# Patient Record
Sex: Male | Born: 1945 | Race: White | Hispanic: No | Marital: Married | Smoking: Former smoker
Health system: Southern US, Community
[De-identification: ages and names within clinical notes are randomized; demographics above are authoritative.]

## PROBLEM LIST (undated history)

## (undated) DIAGNOSIS — C439 Malignant melanoma of skin, unspecified: Secondary | ICD-10-CM

## (undated) DIAGNOSIS — I10 Essential (primary) hypertension: Secondary | ICD-10-CM

## (undated) DIAGNOSIS — M48061 Spinal stenosis, lumbar region without neurogenic claudication: Secondary | ICD-10-CM

## (undated) DIAGNOSIS — E7800 Pure hypercholesterolemia, unspecified: Secondary | ICD-10-CM

## (undated) DIAGNOSIS — M1990 Unspecified osteoarthritis, unspecified site: Secondary | ICD-10-CM

## (undated) DIAGNOSIS — I639 Cerebral infarction, unspecified: Secondary | ICD-10-CM

## (undated) DIAGNOSIS — K279 Peptic ulcer, site unspecified, unspecified as acute or chronic, without hemorrhage or perforation: Secondary | ICD-10-CM

## (undated) DIAGNOSIS — J189 Pneumonia, unspecified organism: Secondary | ICD-10-CM

## (undated) DIAGNOSIS — Z860100 Personal history of colon polyps, unspecified: Secondary | ICD-10-CM

## (undated) DIAGNOSIS — C44529 Squamous cell carcinoma of skin of other part of trunk: Secondary | ICD-10-CM

## (undated) DIAGNOSIS — Z87442 Personal history of urinary calculi: Secondary | ICD-10-CM

## (undated) DIAGNOSIS — K219 Gastro-esophageal reflux disease without esophagitis: Secondary | ICD-10-CM

## (undated) HISTORY — DX: Malignant melanoma of skin, unspecified: C43.9

## (undated) HISTORY — DX: Squamous cell carcinoma of skin of other part of trunk: C44.529

## (undated) HISTORY — DX: Spinal stenosis, lumbar region without neurogenic claudication: M48.061

## (undated) HISTORY — PX: PARTIAL GASTRECTOMY: SHX2172

---

## 2004-10-28 ENCOUNTER — Ambulatory Visit: Payer: Self-pay | Admitting: Internal Medicine

## 2004-11-11 ENCOUNTER — Ambulatory Visit: Payer: Self-pay | Admitting: Internal Medicine

## 2004-11-23 ENCOUNTER — Ambulatory Visit: Payer: Self-pay | Admitting: *Deleted

## 2004-12-14 ENCOUNTER — Encounter: Admission: RE | Admit: 2004-12-14 | Discharge: 2004-12-14 | Payer: Self-pay | Admitting: Thoracic Surgery

## 2010-09-11 ENCOUNTER — Encounter: Payer: Self-pay | Admitting: Thoracic Surgery

## 2014-11-11 DIAGNOSIS — I10 Essential (primary) hypertension: Secondary | ICD-10-CM | POA: Diagnosis not present

## 2014-11-11 DIAGNOSIS — E780 Pure hypercholesterolemia: Secondary | ICD-10-CM | POA: Diagnosis not present

## 2014-11-11 DIAGNOSIS — Z79899 Other long term (current) drug therapy: Secondary | ICD-10-CM | POA: Diagnosis not present

## 2014-11-11 DIAGNOSIS — Z1389 Encounter for screening for other disorder: Secondary | ICD-10-CM | POA: Diagnosis not present

## 2014-11-11 DIAGNOSIS — I679 Cerebrovascular disease, unspecified: Secondary | ICD-10-CM | POA: Diagnosis not present

## 2014-11-11 DIAGNOSIS — Z0000 Encounter for general adult medical examination without abnormal findings: Secondary | ICD-10-CM | POA: Diagnosis not present

## 2014-11-11 DIAGNOSIS — Z23 Encounter for immunization: Secondary | ICD-10-CM | POA: Diagnosis not present

## 2014-11-11 DIAGNOSIS — Z125 Encounter for screening for malignant neoplasm of prostate: Secondary | ICD-10-CM | POA: Diagnosis not present

## 2015-03-25 DIAGNOSIS — J209 Acute bronchitis, unspecified: Secondary | ICD-10-CM | POA: Diagnosis not present

## 2015-03-29 DIAGNOSIS — D128 Benign neoplasm of rectum: Secondary | ICD-10-CM | POA: Diagnosis not present

## 2015-03-29 DIAGNOSIS — D122 Benign neoplasm of ascending colon: Secondary | ICD-10-CM | POA: Diagnosis not present

## 2015-03-29 DIAGNOSIS — K621 Rectal polyp: Secondary | ICD-10-CM | POA: Diagnosis not present

## 2015-03-29 DIAGNOSIS — D126 Benign neoplasm of colon, unspecified: Secondary | ICD-10-CM | POA: Diagnosis not present

## 2015-03-29 DIAGNOSIS — Z86010 Personal history of colonic polyps: Secondary | ICD-10-CM | POA: Diagnosis not present

## 2015-03-29 DIAGNOSIS — D123 Benign neoplasm of transverse colon: Secondary | ICD-10-CM | POA: Diagnosis not present

## 2015-03-29 DIAGNOSIS — D121 Benign neoplasm of appendix: Secondary | ICD-10-CM | POA: Diagnosis not present

## 2015-03-29 DIAGNOSIS — Z09 Encounter for follow-up examination after completed treatment for conditions other than malignant neoplasm: Secondary | ICD-10-CM | POA: Diagnosis not present

## 2015-06-08 DIAGNOSIS — Z23 Encounter for immunization: Secondary | ICD-10-CM | POA: Diagnosis not present

## 2015-09-03 DIAGNOSIS — S39012A Strain of muscle, fascia and tendon of lower back, initial encounter: Secondary | ICD-10-CM | POA: Diagnosis not present

## 2015-12-08 DIAGNOSIS — M25561 Pain in right knee: Secondary | ICD-10-CM | POA: Diagnosis not present

## 2016-04-26 DIAGNOSIS — Z79899 Other long term (current) drug therapy: Secondary | ICD-10-CM | POA: Diagnosis not present

## 2016-04-26 DIAGNOSIS — E785 Hyperlipidemia, unspecified: Secondary | ICD-10-CM | POA: Diagnosis not present

## 2016-04-26 DIAGNOSIS — Z9181 History of falling: Secondary | ICD-10-CM | POA: Diagnosis not present

## 2016-04-26 DIAGNOSIS — M25561 Pain in right knee: Secondary | ICD-10-CM | POA: Diagnosis not present

## 2016-04-26 DIAGNOSIS — I10 Essential (primary) hypertension: Secondary | ICD-10-CM | POA: Diagnosis not present

## 2016-04-26 DIAGNOSIS — Z1389 Encounter for screening for other disorder: Secondary | ICD-10-CM | POA: Diagnosis not present

## 2016-04-26 DIAGNOSIS — Z8673 Personal history of transient ischemic attack (TIA), and cerebral infarction without residual deficits: Secondary | ICD-10-CM | POA: Diagnosis not present

## 2016-04-26 DIAGNOSIS — Z0000 Encounter for general adult medical examination without abnormal findings: Secondary | ICD-10-CM | POA: Diagnosis not present

## 2016-06-05 DIAGNOSIS — Z23 Encounter for immunization: Secondary | ICD-10-CM | POA: Diagnosis not present

## 2016-11-01 DIAGNOSIS — H6091 Unspecified otitis externa, right ear: Secondary | ICD-10-CM | POA: Diagnosis not present

## 2017-04-12 DIAGNOSIS — M1711 Unilateral primary osteoarthritis, right knee: Secondary | ICD-10-CM | POA: Diagnosis not present

## 2017-04-12 DIAGNOSIS — M25561 Pain in right knee: Secondary | ICD-10-CM | POA: Diagnosis not present

## 2017-04-12 DIAGNOSIS — M898X8 Other specified disorders of bone, other site: Secondary | ICD-10-CM | POA: Diagnosis not present

## 2017-04-12 DIAGNOSIS — G8929 Other chronic pain: Secondary | ICD-10-CM | POA: Diagnosis not present

## 2017-05-02 DIAGNOSIS — Z79899 Other long term (current) drug therapy: Secondary | ICD-10-CM | POA: Diagnosis not present

## 2017-05-02 DIAGNOSIS — Z23 Encounter for immunization: Secondary | ICD-10-CM | POA: Diagnosis not present

## 2017-05-02 DIAGNOSIS — E785 Hyperlipidemia, unspecified: Secondary | ICD-10-CM | POA: Diagnosis not present

## 2017-05-02 DIAGNOSIS — Z0000 Encounter for general adult medical examination without abnormal findings: Secondary | ICD-10-CM | POA: Diagnosis not present

## 2017-05-02 DIAGNOSIS — I10 Essential (primary) hypertension: Secondary | ICD-10-CM | POA: Diagnosis not present

## 2017-05-02 DIAGNOSIS — Z6833 Body mass index (BMI) 33.0-33.9, adult: Secondary | ICD-10-CM | POA: Diagnosis not present

## 2017-05-02 DIAGNOSIS — Z8673 Personal history of transient ischemic attack (TIA), and cerebral infarction without residual deficits: Secondary | ICD-10-CM | POA: Diagnosis not present

## 2017-05-11 DIAGNOSIS — M23261 Derangement of other lateral meniscus due to old tear or injury, right knee: Secondary | ICD-10-CM | POA: Diagnosis not present

## 2017-05-11 DIAGNOSIS — M25561 Pain in right knee: Secondary | ICD-10-CM | POA: Diagnosis not present

## 2017-05-22 DIAGNOSIS — M23300 Other meniscus derangements, unspecified lateral meniscus, right knee: Secondary | ICD-10-CM | POA: Diagnosis not present

## 2017-05-22 DIAGNOSIS — M1711 Unilateral primary osteoarthritis, right knee: Secondary | ICD-10-CM | POA: Diagnosis not present

## 2017-05-22 DIAGNOSIS — G8929 Other chronic pain: Secondary | ICD-10-CM | POA: Diagnosis not present

## 2017-05-22 DIAGNOSIS — M23303 Other meniscus derangements, unspecified medial meniscus, right knee: Secondary | ICD-10-CM | POA: Diagnosis not present

## 2017-05-30 DIAGNOSIS — G8918 Other acute postprocedural pain: Secondary | ICD-10-CM | POA: Diagnosis not present

## 2017-05-30 DIAGNOSIS — S83271A Complex tear of lateral meniscus, current injury, right knee, initial encounter: Secondary | ICD-10-CM | POA: Diagnosis not present

## 2017-05-30 DIAGNOSIS — M659 Synovitis and tenosynovitis, unspecified: Secondary | ICD-10-CM | POA: Diagnosis not present

## 2017-05-30 DIAGNOSIS — M2241 Chondromalacia patellae, right knee: Secondary | ICD-10-CM | POA: Diagnosis not present

## 2017-05-30 DIAGNOSIS — M6751 Plica syndrome, right knee: Secondary | ICD-10-CM | POA: Diagnosis not present

## 2017-05-30 DIAGNOSIS — S83231A Complex tear of medial meniscus, current injury, right knee, initial encounter: Secondary | ICD-10-CM | POA: Diagnosis not present

## 2017-06-04 DIAGNOSIS — Z23 Encounter for immunization: Secondary | ICD-10-CM | POA: Diagnosis not present

## 2017-06-06 DIAGNOSIS — M25461 Effusion, right knee: Secondary | ICD-10-CM | POA: Diagnosis not present

## 2017-06-06 DIAGNOSIS — R2689 Other abnormalities of gait and mobility: Secondary | ICD-10-CM | POA: Diagnosis not present

## 2017-06-06 DIAGNOSIS — M62551 Muscle wasting and atrophy, not elsewhere classified, right thigh: Secondary | ICD-10-CM | POA: Diagnosis not present

## 2017-06-06 DIAGNOSIS — M25561 Pain in right knee: Secondary | ICD-10-CM | POA: Diagnosis not present

## 2017-06-08 DIAGNOSIS — M62551 Muscle wasting and atrophy, not elsewhere classified, right thigh: Secondary | ICD-10-CM | POA: Diagnosis not present

## 2017-06-08 DIAGNOSIS — M25561 Pain in right knee: Secondary | ICD-10-CM | POA: Diagnosis not present

## 2017-06-08 DIAGNOSIS — R2689 Other abnormalities of gait and mobility: Secondary | ICD-10-CM | POA: Diagnosis not present

## 2017-06-08 DIAGNOSIS — M25461 Effusion, right knee: Secondary | ICD-10-CM | POA: Diagnosis not present

## 2017-06-12 DIAGNOSIS — M25461 Effusion, right knee: Secondary | ICD-10-CM | POA: Diagnosis not present

## 2017-06-12 DIAGNOSIS — M25561 Pain in right knee: Secondary | ICD-10-CM | POA: Diagnosis not present

## 2017-06-12 DIAGNOSIS — M62551 Muscle wasting and atrophy, not elsewhere classified, right thigh: Secondary | ICD-10-CM | POA: Diagnosis not present

## 2017-06-12 DIAGNOSIS — R2689 Other abnormalities of gait and mobility: Secondary | ICD-10-CM | POA: Diagnosis not present

## 2017-06-15 DIAGNOSIS — M25461 Effusion, right knee: Secondary | ICD-10-CM | POA: Diagnosis not present

## 2017-06-15 DIAGNOSIS — R2689 Other abnormalities of gait and mobility: Secondary | ICD-10-CM | POA: Diagnosis not present

## 2017-06-15 DIAGNOSIS — M62551 Muscle wasting and atrophy, not elsewhere classified, right thigh: Secondary | ICD-10-CM | POA: Diagnosis not present

## 2017-06-15 DIAGNOSIS — M25561 Pain in right knee: Secondary | ICD-10-CM | POA: Diagnosis not present

## 2017-06-20 DIAGNOSIS — M25461 Effusion, right knee: Secondary | ICD-10-CM | POA: Diagnosis not present

## 2017-06-20 DIAGNOSIS — M62551 Muscle wasting and atrophy, not elsewhere classified, right thigh: Secondary | ICD-10-CM | POA: Diagnosis not present

## 2017-06-20 DIAGNOSIS — R2689 Other abnormalities of gait and mobility: Secondary | ICD-10-CM | POA: Diagnosis not present

## 2017-06-20 DIAGNOSIS — M25561 Pain in right knee: Secondary | ICD-10-CM | POA: Diagnosis not present

## 2017-06-22 DIAGNOSIS — M25561 Pain in right knee: Secondary | ICD-10-CM | POA: Diagnosis not present

## 2017-06-22 DIAGNOSIS — R2689 Other abnormalities of gait and mobility: Secondary | ICD-10-CM | POA: Diagnosis not present

## 2017-06-22 DIAGNOSIS — M25461 Effusion, right knee: Secondary | ICD-10-CM | POA: Diagnosis not present

## 2017-06-22 DIAGNOSIS — M62551 Muscle wasting and atrophy, not elsewhere classified, right thigh: Secondary | ICD-10-CM | POA: Diagnosis not present

## 2017-07-03 DIAGNOSIS — J40 Bronchitis, not specified as acute or chronic: Secondary | ICD-10-CM | POA: Diagnosis not present

## 2017-07-03 DIAGNOSIS — H6092 Unspecified otitis externa, left ear: Secondary | ICD-10-CM | POA: Diagnosis not present

## 2017-07-03 DIAGNOSIS — Z6834 Body mass index (BMI) 34.0-34.9, adult: Secondary | ICD-10-CM | POA: Diagnosis not present

## 2017-07-03 DIAGNOSIS — J329 Chronic sinusitis, unspecified: Secondary | ICD-10-CM | POA: Diagnosis not present

## 2017-07-06 DIAGNOSIS — M25461 Effusion, right knee: Secondary | ICD-10-CM | POA: Diagnosis not present

## 2017-07-06 DIAGNOSIS — M62551 Muscle wasting and atrophy, not elsewhere classified, right thigh: Secondary | ICD-10-CM | POA: Diagnosis not present

## 2017-07-06 DIAGNOSIS — R2689 Other abnormalities of gait and mobility: Secondary | ICD-10-CM | POA: Diagnosis not present

## 2017-07-06 DIAGNOSIS — M25561 Pain in right knee: Secondary | ICD-10-CM | POA: Diagnosis not present

## 2017-09-13 DIAGNOSIS — Z9889 Other specified postprocedural states: Secondary | ICD-10-CM | POA: Diagnosis not present

## 2017-09-13 DIAGNOSIS — M7631 Iliotibial band syndrome, right leg: Secondary | ICD-10-CM | POA: Diagnosis not present

## 2017-09-13 DIAGNOSIS — M1711 Unilateral primary osteoarthritis, right knee: Secondary | ICD-10-CM | POA: Diagnosis not present

## 2017-09-13 DIAGNOSIS — G8929 Other chronic pain: Secondary | ICD-10-CM | POA: Diagnosis not present

## 2017-09-19 DIAGNOSIS — M25561 Pain in right knee: Secondary | ICD-10-CM | POA: Diagnosis not present

## 2017-09-19 DIAGNOSIS — M62551 Muscle wasting and atrophy, not elsewhere classified, right thigh: Secondary | ICD-10-CM | POA: Diagnosis not present

## 2017-09-19 DIAGNOSIS — R2689 Other abnormalities of gait and mobility: Secondary | ICD-10-CM | POA: Diagnosis not present

## 2017-09-21 DIAGNOSIS — M62551 Muscle wasting and atrophy, not elsewhere classified, right thigh: Secondary | ICD-10-CM | POA: Diagnosis not present

## 2017-09-21 DIAGNOSIS — R2689 Other abnormalities of gait and mobility: Secondary | ICD-10-CM | POA: Diagnosis not present

## 2017-09-21 DIAGNOSIS — M25561 Pain in right knee: Secondary | ICD-10-CM | POA: Diagnosis not present

## 2017-09-26 DIAGNOSIS — M62551 Muscle wasting and atrophy, not elsewhere classified, right thigh: Secondary | ICD-10-CM | POA: Diagnosis not present

## 2017-09-26 DIAGNOSIS — R2689 Other abnormalities of gait and mobility: Secondary | ICD-10-CM | POA: Diagnosis not present

## 2017-09-26 DIAGNOSIS — M25561 Pain in right knee: Secondary | ICD-10-CM | POA: Diagnosis not present

## 2017-09-28 DIAGNOSIS — M62551 Muscle wasting and atrophy, not elsewhere classified, right thigh: Secondary | ICD-10-CM | POA: Diagnosis not present

## 2017-09-28 DIAGNOSIS — M25561 Pain in right knee: Secondary | ICD-10-CM | POA: Diagnosis not present

## 2017-09-28 DIAGNOSIS — R2689 Other abnormalities of gait and mobility: Secondary | ICD-10-CM | POA: Diagnosis not present

## 2017-10-02 DIAGNOSIS — M62551 Muscle wasting and atrophy, not elsewhere classified, right thigh: Secondary | ICD-10-CM | POA: Diagnosis not present

## 2017-10-02 DIAGNOSIS — M25561 Pain in right knee: Secondary | ICD-10-CM | POA: Diagnosis not present

## 2017-10-02 DIAGNOSIS — R2689 Other abnormalities of gait and mobility: Secondary | ICD-10-CM | POA: Diagnosis not present

## 2017-10-05 DIAGNOSIS — M25561 Pain in right knee: Secondary | ICD-10-CM | POA: Diagnosis not present

## 2017-10-05 DIAGNOSIS — M62551 Muscle wasting and atrophy, not elsewhere classified, right thigh: Secondary | ICD-10-CM | POA: Diagnosis not present

## 2017-10-05 DIAGNOSIS — R2689 Other abnormalities of gait and mobility: Secondary | ICD-10-CM | POA: Diagnosis not present

## 2017-10-16 DIAGNOSIS — M62551 Muscle wasting and atrophy, not elsewhere classified, right thigh: Secondary | ICD-10-CM | POA: Diagnosis not present

## 2017-10-16 DIAGNOSIS — R2689 Other abnormalities of gait and mobility: Secondary | ICD-10-CM | POA: Diagnosis not present

## 2017-10-16 DIAGNOSIS — M25561 Pain in right knee: Secondary | ICD-10-CM | POA: Diagnosis not present

## 2017-10-23 DIAGNOSIS — R2689 Other abnormalities of gait and mobility: Secondary | ICD-10-CM | POA: Diagnosis not present

## 2017-10-23 DIAGNOSIS — M25561 Pain in right knee: Secondary | ICD-10-CM | POA: Diagnosis not present

## 2017-10-23 DIAGNOSIS — M62551 Muscle wasting and atrophy, not elsewhere classified, right thigh: Secondary | ICD-10-CM | POA: Diagnosis not present

## 2017-11-20 DIAGNOSIS — Z9889 Other specified postprocedural states: Secondary | ICD-10-CM | POA: Diagnosis not present

## 2017-11-20 DIAGNOSIS — M7631 Iliotibial band syndrome, right leg: Secondary | ICD-10-CM | POA: Diagnosis not present

## 2017-11-28 DIAGNOSIS — M5137 Other intervertebral disc degeneration, lumbosacral region: Secondary | ICD-10-CM | POA: Diagnosis not present

## 2017-11-28 DIAGNOSIS — M62830 Muscle spasm of back: Secondary | ICD-10-CM | POA: Diagnosis not present

## 2017-11-28 DIAGNOSIS — M9904 Segmental and somatic dysfunction of sacral region: Secondary | ICD-10-CM | POA: Diagnosis not present

## 2017-11-28 DIAGNOSIS — M9903 Segmental and somatic dysfunction of lumbar region: Secondary | ICD-10-CM | POA: Diagnosis not present

## 2017-11-28 DIAGNOSIS — M5441 Lumbago with sciatica, right side: Secondary | ICD-10-CM | POA: Diagnosis not present

## 2017-11-28 DIAGNOSIS — S8391XA Sprain of unspecified site of right knee, initial encounter: Secondary | ICD-10-CM | POA: Diagnosis not present

## 2017-11-28 DIAGNOSIS — M5136 Other intervertebral disc degeneration, lumbar region: Secondary | ICD-10-CM | POA: Diagnosis not present

## 2017-12-05 DIAGNOSIS — M5137 Other intervertebral disc degeneration, lumbosacral region: Secondary | ICD-10-CM | POA: Diagnosis not present

## 2017-12-05 DIAGNOSIS — M62830 Muscle spasm of back: Secondary | ICD-10-CM | POA: Diagnosis not present

## 2017-12-05 DIAGNOSIS — M9903 Segmental and somatic dysfunction of lumbar region: Secondary | ICD-10-CM | POA: Diagnosis not present

## 2017-12-05 DIAGNOSIS — M9904 Segmental and somatic dysfunction of sacral region: Secondary | ICD-10-CM | POA: Diagnosis not present

## 2017-12-05 DIAGNOSIS — M5441 Lumbago with sciatica, right side: Secondary | ICD-10-CM | POA: Diagnosis not present

## 2017-12-05 DIAGNOSIS — S8391XA Sprain of unspecified site of right knee, initial encounter: Secondary | ICD-10-CM | POA: Diagnosis not present

## 2017-12-05 DIAGNOSIS — M5136 Other intervertebral disc degeneration, lumbar region: Secondary | ICD-10-CM | POA: Diagnosis not present

## 2017-12-10 DIAGNOSIS — S8391XA Sprain of unspecified site of right knee, initial encounter: Secondary | ICD-10-CM | POA: Diagnosis not present

## 2017-12-10 DIAGNOSIS — M9903 Segmental and somatic dysfunction of lumbar region: Secondary | ICD-10-CM | POA: Diagnosis not present

## 2017-12-10 DIAGNOSIS — M9904 Segmental and somatic dysfunction of sacral region: Secondary | ICD-10-CM | POA: Diagnosis not present

## 2017-12-10 DIAGNOSIS — M5136 Other intervertebral disc degeneration, lumbar region: Secondary | ICD-10-CM | POA: Diagnosis not present

## 2017-12-10 DIAGNOSIS — M5441 Lumbago with sciatica, right side: Secondary | ICD-10-CM | POA: Diagnosis not present

## 2017-12-10 DIAGNOSIS — M62830 Muscle spasm of back: Secondary | ICD-10-CM | POA: Diagnosis not present

## 2017-12-10 DIAGNOSIS — M5137 Other intervertebral disc degeneration, lumbosacral region: Secondary | ICD-10-CM | POA: Diagnosis not present

## 2017-12-12 DIAGNOSIS — M5137 Other intervertebral disc degeneration, lumbosacral region: Secondary | ICD-10-CM | POA: Diagnosis not present

## 2017-12-12 DIAGNOSIS — M62830 Muscle spasm of back: Secondary | ICD-10-CM | POA: Diagnosis not present

## 2017-12-12 DIAGNOSIS — M9904 Segmental and somatic dysfunction of sacral region: Secondary | ICD-10-CM | POA: Diagnosis not present

## 2017-12-12 DIAGNOSIS — S8391XA Sprain of unspecified site of right knee, initial encounter: Secondary | ICD-10-CM | POA: Diagnosis not present

## 2017-12-12 DIAGNOSIS — M5441 Lumbago with sciatica, right side: Secondary | ICD-10-CM | POA: Diagnosis not present

## 2017-12-12 DIAGNOSIS — M5136 Other intervertebral disc degeneration, lumbar region: Secondary | ICD-10-CM | POA: Diagnosis not present

## 2017-12-12 DIAGNOSIS — M9903 Segmental and somatic dysfunction of lumbar region: Secondary | ICD-10-CM | POA: Diagnosis not present

## 2017-12-14 DIAGNOSIS — M5441 Lumbago with sciatica, right side: Secondary | ICD-10-CM | POA: Diagnosis not present

## 2017-12-14 DIAGNOSIS — M9903 Segmental and somatic dysfunction of lumbar region: Secondary | ICD-10-CM | POA: Diagnosis not present

## 2017-12-14 DIAGNOSIS — M5137 Other intervertebral disc degeneration, lumbosacral region: Secondary | ICD-10-CM | POA: Diagnosis not present

## 2017-12-14 DIAGNOSIS — M9904 Segmental and somatic dysfunction of sacral region: Secondary | ICD-10-CM | POA: Diagnosis not present

## 2017-12-14 DIAGNOSIS — M5136 Other intervertebral disc degeneration, lumbar region: Secondary | ICD-10-CM | POA: Diagnosis not present

## 2017-12-14 DIAGNOSIS — M62830 Muscle spasm of back: Secondary | ICD-10-CM | POA: Diagnosis not present

## 2017-12-14 DIAGNOSIS — S8391XA Sprain of unspecified site of right knee, initial encounter: Secondary | ICD-10-CM | POA: Diagnosis not present

## 2017-12-17 DIAGNOSIS — S8391XA Sprain of unspecified site of right knee, initial encounter: Secondary | ICD-10-CM | POA: Diagnosis not present

## 2017-12-17 DIAGNOSIS — M5136 Other intervertebral disc degeneration, lumbar region: Secondary | ICD-10-CM | POA: Diagnosis not present

## 2017-12-17 DIAGNOSIS — M5441 Lumbago with sciatica, right side: Secondary | ICD-10-CM | POA: Diagnosis not present

## 2017-12-17 DIAGNOSIS — M5137 Other intervertebral disc degeneration, lumbosacral region: Secondary | ICD-10-CM | POA: Diagnosis not present

## 2017-12-17 DIAGNOSIS — M9904 Segmental and somatic dysfunction of sacral region: Secondary | ICD-10-CM | POA: Diagnosis not present

## 2017-12-17 DIAGNOSIS — M9903 Segmental and somatic dysfunction of lumbar region: Secondary | ICD-10-CM | POA: Diagnosis not present

## 2017-12-17 DIAGNOSIS — M62830 Muscle spasm of back: Secondary | ICD-10-CM | POA: Diagnosis not present

## 2017-12-19 DIAGNOSIS — M9904 Segmental and somatic dysfunction of sacral region: Secondary | ICD-10-CM | POA: Diagnosis not present

## 2017-12-19 DIAGNOSIS — S8391XA Sprain of unspecified site of right knee, initial encounter: Secondary | ICD-10-CM | POA: Diagnosis not present

## 2017-12-19 DIAGNOSIS — M5136 Other intervertebral disc degeneration, lumbar region: Secondary | ICD-10-CM | POA: Diagnosis not present

## 2017-12-19 DIAGNOSIS — M62830 Muscle spasm of back: Secondary | ICD-10-CM | POA: Diagnosis not present

## 2017-12-19 DIAGNOSIS — M5137 Other intervertebral disc degeneration, lumbosacral region: Secondary | ICD-10-CM | POA: Diagnosis not present

## 2017-12-19 DIAGNOSIS — M9903 Segmental and somatic dysfunction of lumbar region: Secondary | ICD-10-CM | POA: Diagnosis not present

## 2017-12-19 DIAGNOSIS — M5441 Lumbago with sciatica, right side: Secondary | ICD-10-CM | POA: Diagnosis not present

## 2017-12-21 DIAGNOSIS — S8391XA Sprain of unspecified site of right knee, initial encounter: Secondary | ICD-10-CM | POA: Diagnosis not present

## 2017-12-21 DIAGNOSIS — M62830 Muscle spasm of back: Secondary | ICD-10-CM | POA: Diagnosis not present

## 2017-12-21 DIAGNOSIS — M5136 Other intervertebral disc degeneration, lumbar region: Secondary | ICD-10-CM | POA: Diagnosis not present

## 2017-12-21 DIAGNOSIS — M9903 Segmental and somatic dysfunction of lumbar region: Secondary | ICD-10-CM | POA: Diagnosis not present

## 2017-12-21 DIAGNOSIS — M9904 Segmental and somatic dysfunction of sacral region: Secondary | ICD-10-CM | POA: Diagnosis not present

## 2017-12-21 DIAGNOSIS — M5441 Lumbago with sciatica, right side: Secondary | ICD-10-CM | POA: Diagnosis not present

## 2017-12-21 DIAGNOSIS — M5137 Other intervertebral disc degeneration, lumbosacral region: Secondary | ICD-10-CM | POA: Diagnosis not present

## 2017-12-24 DIAGNOSIS — S8391XA Sprain of unspecified site of right knee, initial encounter: Secondary | ICD-10-CM | POA: Diagnosis not present

## 2017-12-24 DIAGNOSIS — M5137 Other intervertebral disc degeneration, lumbosacral region: Secondary | ICD-10-CM | POA: Diagnosis not present

## 2017-12-24 DIAGNOSIS — M9904 Segmental and somatic dysfunction of sacral region: Secondary | ICD-10-CM | POA: Diagnosis not present

## 2017-12-24 DIAGNOSIS — M5441 Lumbago with sciatica, right side: Secondary | ICD-10-CM | POA: Diagnosis not present

## 2017-12-24 DIAGNOSIS — M9903 Segmental and somatic dysfunction of lumbar region: Secondary | ICD-10-CM | POA: Diagnosis not present

## 2017-12-24 DIAGNOSIS — M62830 Muscle spasm of back: Secondary | ICD-10-CM | POA: Diagnosis not present

## 2017-12-24 DIAGNOSIS — M5136 Other intervertebral disc degeneration, lumbar region: Secondary | ICD-10-CM | POA: Diagnosis not present

## 2017-12-26 DIAGNOSIS — M5137 Other intervertebral disc degeneration, lumbosacral region: Secondary | ICD-10-CM | POA: Diagnosis not present

## 2017-12-26 DIAGNOSIS — S8391XA Sprain of unspecified site of right knee, initial encounter: Secondary | ICD-10-CM | POA: Diagnosis not present

## 2017-12-26 DIAGNOSIS — M9903 Segmental and somatic dysfunction of lumbar region: Secondary | ICD-10-CM | POA: Diagnosis not present

## 2017-12-26 DIAGNOSIS — M62830 Muscle spasm of back: Secondary | ICD-10-CM | POA: Diagnosis not present

## 2017-12-26 DIAGNOSIS — M9904 Segmental and somatic dysfunction of sacral region: Secondary | ICD-10-CM | POA: Diagnosis not present

## 2017-12-26 DIAGNOSIS — M5441 Lumbago with sciatica, right side: Secondary | ICD-10-CM | POA: Diagnosis not present

## 2017-12-26 DIAGNOSIS — M5136 Other intervertebral disc degeneration, lumbar region: Secondary | ICD-10-CM | POA: Diagnosis not present

## 2017-12-31 DIAGNOSIS — M9904 Segmental and somatic dysfunction of sacral region: Secondary | ICD-10-CM | POA: Diagnosis not present

## 2017-12-31 DIAGNOSIS — M5441 Lumbago with sciatica, right side: Secondary | ICD-10-CM | POA: Diagnosis not present

## 2017-12-31 DIAGNOSIS — M5136 Other intervertebral disc degeneration, lumbar region: Secondary | ICD-10-CM | POA: Diagnosis not present

## 2017-12-31 DIAGNOSIS — M62830 Muscle spasm of back: Secondary | ICD-10-CM | POA: Diagnosis not present

## 2017-12-31 DIAGNOSIS — M5137 Other intervertebral disc degeneration, lumbosacral region: Secondary | ICD-10-CM | POA: Diagnosis not present

## 2017-12-31 DIAGNOSIS — S8391XA Sprain of unspecified site of right knee, initial encounter: Secondary | ICD-10-CM | POA: Diagnosis not present

## 2017-12-31 DIAGNOSIS — M9903 Segmental and somatic dysfunction of lumbar region: Secondary | ICD-10-CM | POA: Diagnosis not present

## 2018-01-02 DIAGNOSIS — M9904 Segmental and somatic dysfunction of sacral region: Secondary | ICD-10-CM | POA: Diagnosis not present

## 2018-01-02 DIAGNOSIS — M5136 Other intervertebral disc degeneration, lumbar region: Secondary | ICD-10-CM | POA: Diagnosis not present

## 2018-01-02 DIAGNOSIS — M5441 Lumbago with sciatica, right side: Secondary | ICD-10-CM | POA: Diagnosis not present

## 2018-01-02 DIAGNOSIS — S8391XA Sprain of unspecified site of right knee, initial encounter: Secondary | ICD-10-CM | POA: Diagnosis not present

## 2018-01-02 DIAGNOSIS — M5137 Other intervertebral disc degeneration, lumbosacral region: Secondary | ICD-10-CM | POA: Diagnosis not present

## 2018-01-02 DIAGNOSIS — M9903 Segmental and somatic dysfunction of lumbar region: Secondary | ICD-10-CM | POA: Diagnosis not present

## 2018-01-02 DIAGNOSIS — M62830 Muscle spasm of back: Secondary | ICD-10-CM | POA: Diagnosis not present

## 2018-01-04 DIAGNOSIS — M5441 Lumbago with sciatica, right side: Secondary | ICD-10-CM | POA: Diagnosis not present

## 2018-01-04 DIAGNOSIS — M5137 Other intervertebral disc degeneration, lumbosacral region: Secondary | ICD-10-CM | POA: Diagnosis not present

## 2018-01-04 DIAGNOSIS — S8391XA Sprain of unspecified site of right knee, initial encounter: Secondary | ICD-10-CM | POA: Diagnosis not present

## 2018-01-04 DIAGNOSIS — M62830 Muscle spasm of back: Secondary | ICD-10-CM | POA: Diagnosis not present

## 2018-01-04 DIAGNOSIS — M5136 Other intervertebral disc degeneration, lumbar region: Secondary | ICD-10-CM | POA: Diagnosis not present

## 2018-01-04 DIAGNOSIS — M9903 Segmental and somatic dysfunction of lumbar region: Secondary | ICD-10-CM | POA: Diagnosis not present

## 2018-01-04 DIAGNOSIS — M9904 Segmental and somatic dysfunction of sacral region: Secondary | ICD-10-CM | POA: Diagnosis not present

## 2018-01-07 DIAGNOSIS — M5441 Lumbago with sciatica, right side: Secondary | ICD-10-CM | POA: Diagnosis not present

## 2018-01-07 DIAGNOSIS — M5137 Other intervertebral disc degeneration, lumbosacral region: Secondary | ICD-10-CM | POA: Diagnosis not present

## 2018-01-07 DIAGNOSIS — M9904 Segmental and somatic dysfunction of sacral region: Secondary | ICD-10-CM | POA: Diagnosis not present

## 2018-01-07 DIAGNOSIS — M62830 Muscle spasm of back: Secondary | ICD-10-CM | POA: Diagnosis not present

## 2018-01-07 DIAGNOSIS — M5136 Other intervertebral disc degeneration, lumbar region: Secondary | ICD-10-CM | POA: Diagnosis not present

## 2018-01-07 DIAGNOSIS — M9903 Segmental and somatic dysfunction of lumbar region: Secondary | ICD-10-CM | POA: Diagnosis not present

## 2018-01-07 DIAGNOSIS — S8391XA Sprain of unspecified site of right knee, initial encounter: Secondary | ICD-10-CM | POA: Diagnosis not present

## 2018-01-09 DIAGNOSIS — M9904 Segmental and somatic dysfunction of sacral region: Secondary | ICD-10-CM | POA: Diagnosis not present

## 2018-01-09 DIAGNOSIS — S8391XA Sprain of unspecified site of right knee, initial encounter: Secondary | ICD-10-CM | POA: Diagnosis not present

## 2018-01-09 DIAGNOSIS — M5136 Other intervertebral disc degeneration, lumbar region: Secondary | ICD-10-CM | POA: Diagnosis not present

## 2018-01-09 DIAGNOSIS — M5137 Other intervertebral disc degeneration, lumbosacral region: Secondary | ICD-10-CM | POA: Diagnosis not present

## 2018-01-09 DIAGNOSIS — M5441 Lumbago with sciatica, right side: Secondary | ICD-10-CM | POA: Diagnosis not present

## 2018-01-09 DIAGNOSIS — M9903 Segmental and somatic dysfunction of lumbar region: Secondary | ICD-10-CM | POA: Diagnosis not present

## 2018-01-09 DIAGNOSIS — M62830 Muscle spasm of back: Secondary | ICD-10-CM | POA: Diagnosis not present

## 2018-01-11 DIAGNOSIS — M9903 Segmental and somatic dysfunction of lumbar region: Secondary | ICD-10-CM | POA: Diagnosis not present

## 2018-01-11 DIAGNOSIS — S8391XA Sprain of unspecified site of right knee, initial encounter: Secondary | ICD-10-CM | POA: Diagnosis not present

## 2018-01-11 DIAGNOSIS — M62830 Muscle spasm of back: Secondary | ICD-10-CM | POA: Diagnosis not present

## 2018-01-11 DIAGNOSIS — M5136 Other intervertebral disc degeneration, lumbar region: Secondary | ICD-10-CM | POA: Diagnosis not present

## 2018-01-11 DIAGNOSIS — M5137 Other intervertebral disc degeneration, lumbosacral region: Secondary | ICD-10-CM | POA: Diagnosis not present

## 2018-01-11 DIAGNOSIS — M5441 Lumbago with sciatica, right side: Secondary | ICD-10-CM | POA: Diagnosis not present

## 2018-01-11 DIAGNOSIS — M9904 Segmental and somatic dysfunction of sacral region: Secondary | ICD-10-CM | POA: Diagnosis not present

## 2018-01-28 DIAGNOSIS — M25551 Pain in right hip: Secondary | ICD-10-CM | POA: Diagnosis not present

## 2018-01-28 DIAGNOSIS — M1711 Unilateral primary osteoarthritis, right knee: Secondary | ICD-10-CM | POA: Diagnosis not present

## 2018-02-06 DIAGNOSIS — M1611 Unilateral primary osteoarthritis, right hip: Secondary | ICD-10-CM | POA: Diagnosis not present

## 2018-02-25 DIAGNOSIS — M1611 Unilateral primary osteoarthritis, right hip: Secondary | ICD-10-CM | POA: Diagnosis not present

## 2018-03-13 DIAGNOSIS — M1611 Unilateral primary osteoarthritis, right hip: Secondary | ICD-10-CM | POA: Diagnosis not present

## 2018-05-07 DIAGNOSIS — Z0000 Encounter for general adult medical examination without abnormal findings: Secondary | ICD-10-CM | POA: Diagnosis not present

## 2018-05-07 DIAGNOSIS — Z1331 Encounter for screening for depression: Secondary | ICD-10-CM | POA: Diagnosis not present

## 2018-05-07 DIAGNOSIS — Z23 Encounter for immunization: Secondary | ICD-10-CM | POA: Diagnosis not present

## 2018-05-07 DIAGNOSIS — I10 Essential (primary) hypertension: Secondary | ICD-10-CM | POA: Diagnosis not present

## 2018-05-07 DIAGNOSIS — E785 Hyperlipidemia, unspecified: Secondary | ICD-10-CM | POA: Diagnosis not present

## 2018-05-07 DIAGNOSIS — Z79899 Other long term (current) drug therapy: Secondary | ICD-10-CM | POA: Diagnosis not present

## 2018-05-07 DIAGNOSIS — Z8673 Personal history of transient ischemic attack (TIA), and cerebral infarction without residual deficits: Secondary | ICD-10-CM | POA: Diagnosis not present

## 2018-05-07 DIAGNOSIS — Z01818 Encounter for other preprocedural examination: Secondary | ICD-10-CM | POA: Diagnosis not present

## 2018-05-07 DIAGNOSIS — Z01810 Encounter for preprocedural cardiovascular examination: Secondary | ICD-10-CM | POA: Diagnosis not present

## 2018-05-31 ENCOUNTER — Encounter (HOSPITAL_COMMUNITY): Payer: Self-pay

## 2018-05-31 NOTE — Progress Notes (Addendum)
LOV Dr Sarina Ser 05-07-18 on chart   ekg 05-07-18 on chart [facility]

## 2018-05-31 NOTE — Patient Instructions (Signed)
[name]  05/31/2018   Your procedure is scheduled on: 06-18-18   Report to [facility] Main  Entrance    Report to admitting at 6:00AM    Call this number if you have problems the morning of surgery [phone]     Remember: Do not eat food or drink liquids :After Midnight. BRUSH YOUR TEETH MORNING OF SURGERY AND RINSE YOUR MOUTH OUT, NO CHEWING GUM CANDY OR MINTS.     Take these medicines the morning of surgery with A SIP OF WATER: amlodipine, pravastatin                                 You may not have any metal on your body including hair pins and              piercings  Do not wear jewelry, make-up, lotions, powders or perfumes, deodorant              Men may shave face and neck.   Do not bring valuables to the hospital. [place].  Contacts, dentures or bridgework may not be worn into surgery.  Leave suitcase in the car. After surgery it may be brought to your room.                   Please read over the following fact sheets you were given: _____________________________________________________________________             [facility] - Preparing for Surgery Before surgery, you can play an important role.  Because skin is not sterile, your skin needs to be as free of germs as possible.  You can reduce the number of germs on your skin by washing with CHG (chlorahexidine gluconate) soap before surgery.  CHG is an antiseptic cleaner which kills germs and bonds with the skin to continue killing germs even after washing. Please DO NOT use if you have an allergy to CHG or antibacterial soaps.  If your skin becomes reddened/irritated stop using the CHG and inform your nurse when you arrive at Short Stay. Do not shave (including legs and underarms) for at least 48 hours prior to the first CHG shower.  You may shave your face/neck. Please follow these instructions carefully:  1.  Shower with CHG Soap  the night before surgery and the  morning of Surgery.  2.  If you choose to wash your hair, wash your hair first as usual with your  normal  shampoo.  3.  After you shampoo, rinse your hair and body thoroughly to remove the  shampoo.                           4.  Use CHG as you would any other liquid soap.  You can apply chg directly  to the skin and wash                       Gently with a scrungie or clean washcloth.  5.  Apply the CHG Soap to your body ONLY FROM THE NECK DOWN.   Do not use on face/ open  Wound or open sores. Avoid contact with eyes, ears mouth and genitals (private parts).                       Wash face,  Genitals (private parts) with your normal soap.             6.  Wash thoroughly, paying special attention to the area where your surgery  will be performed.  7.  Thoroughly rinse your body with warm water from the neck down.  8.  DO NOT shower/wash with your normal soap after using and rinsing off  the CHG Soap.                9.  Pat yourself dry with a clean towel.            10.  Wear clean pajamas.            11.  Place clean sheets on your bed the night of your first shower and do not  sleep with pets. Day of Surgery : Do not apply any lotions/deodorants the morning of surgery.  Please wear clean clothes to the hospital/surgery center.  FAILURE TO FOLLOW THESE INSTRUCTIONS MAY RESULT IN THE CANCELLATION OF YOUR SURGERY PATIENT SIGNATURE_________________________________  NURSE SIGNATURE__________________________________  ________________________________________________________________________   [name]  An incentive spirometer is a tool that can help keep your lungs clear and active. This tool measures how well you are filling your lungs with each breath. Taking long deep breaths may help reverse or decrease the chance of developing breathing (pulmonary) problems (especially infection) following:  A long period of time when  you are unable to move or be active. BEFORE THE PROCEDURE   If the spirometer includes an indicator to show your best effort, your nurse or respiratory therapist will set it to a desired goal.  If possible, sit up straight or lean slightly forward. Try not to slouch.  Hold the incentive spirometer in an upright position. INSTRUCTIONS FOR USE  1. Sit on the edge of your bed if possible, or sit up as far as you can in bed or on a chair. 2. Hold the incentive spirometer in an upright position. 3. Breathe out normally. 4. Place the mouthpiece in your mouth and seal your lips tightly around it. 5. Breathe in slowly and as deeply as possible, raising the piston or the ball toward the top of the column. 6. Hold your breath for 3-5 seconds or for as long as possible. Allow the piston or ball to fall to the bottom of the column. 7. Remove the mouthpiece from your mouth and breathe out normally. 8. Rest for a few seconds and repeat Steps 1 through 7 at least 10 times every 1-2 hours when you are awake. Take your time and take a few normal breaths between deep breaths. 9. The spirometer may include an indicator to show your best effort. Use the indicator as a goal to work toward during each repetition. 10. After each set of 10 deep breaths, practice coughing to be sure your lungs are clear. If you have an incision (the cut made at the time of surgery), support your incision when coughing by placing a pillow or rolled up towels firmly against it. Once you are able to get out of bed, walk around indoors and cough well. You may stop using the incentive spirometer when instructed by your caregiver.  RISKS AND COMPLICATIONS  Take your time so you do not get  dizzy or light-headed.  If you are in pain, you may need to take or ask for pain medication before doing incentive spirometry. It is harder to take a deep breath if you are having pain. AFTER USE  Rest and breathe slowly and easily.  It can be  helpful to keep track of a log of your progress. Your caregiver can provide you with a simple table to help with this. If you are using the spirometer at home, follow these instructions: [place] IF:   You are having difficultly using the spirometer.  You have trouble using the spirometer as often as instructed.  Your pain medication is not giving enough relief while using the spirometer.  You develop fever of 100.5 F (38.1 C) or higher. SEEK IMMEDIATE MEDICAL CARE IF:   You cough up bloody sputum that had not been present before.  You develop fever of 102 F (38.9 C) or greater.  You develop worsening pain at or near the incision site. MAKE SURE YOU:   Understand these instructions.  Will watch your condition.  Will get help right away if you are not doing well or get worse. Document Released: 12/18/2006 Document Revised: 10/30/2011 Document Reviewed: 02/18/2007 ExitCare Patient Information 2014 ExitCare, [place].   ________________________________________________________________________  WHAT IS A BLOOD TRANSFUSION? Blood Transfusion Information  A transfusion is the replacement of blood or some of its parts. Blood is made up of multiple cells which provide different functions.  Red blood cells carry oxygen and are used for blood loss replacement.  White blood cells fight against infection.  Platelets control bleeding.  Plasma helps clot blood.  Other blood products are available for specialized needs, such as hemophilia or other clotting disorders. BEFORE THE TRANSFUSION  Who gives blood for transfusions?   Healthy volunteers who are fully evaluated to make sure their blood is safe. This is blood bank blood. Transfusion therapy is the safest it has ever been in the practice of medicine. Before blood is taken from a donor, a complete history is taken to make sure that person has no history of diseases nor engages in risky social behavior (examples are  intravenous drug use or sexual activity with multiple partners). The donor's travel history is screened to minimize risk of transmitting infections, such as malaria. The donated blood is tested for signs of infectious diseases, such as HIV and hepatitis. The blood is then tested to be sure it is compatible with you in order to minimize the chance of a transfusion reaction. If you or a relative donates blood, this is often done in anticipation of surgery and is not appropriate for emergency situations. It takes many days to process the donated blood. RISKS AND COMPLICATIONS Although transfusion therapy is very safe and saves many lives, the main dangers of transfusion include:   Getting an infectious disease.  Developing a transfusion reaction. This is an allergic reaction to something in the blood you were given. Every precaution is taken to prevent this. The decision to have a blood transfusion has been considered carefully by your caregiver before blood is given. Blood is not given unless the benefits outweigh the risks. AFTER THE TRANSFUSION  Right after receiving a blood transfusion, you will usually feel much better and more energetic. This is especially true if your red blood cells have gotten low (anemic). The transfusion raises the level of the red blood cells which carry oxygen, and this usually causes an energy increase.  The nurse administering the transfusion will  monitor you carefully for complications. HOME CARE INSTRUCTIONS  No special instructions are needed after a transfusion. You may find your energy is better. Speak with your caregiver about any limitations on activity for underlying diseases you may have. SEEK MEDICAL CARE IF:   Your condition is not improving after your transfusion.  You develop redness or irritation at the intravenous (IV) site. SEEK IMMEDIATE MEDICAL CARE IF:  Any of the following symptoms occur over the next 12 hours:  Shaking chills.  You have a  temperature by mouth above 102 F (38.9 C), not controlled by medicine.  Chest, back, or muscle pain.  People around you feel you are not acting correctly or are confused.  Shortness of breath or difficulty breathing.  Dizziness and fainting.  You get a rash or develop hives.  You have a decrease in urine output.  Your urine turns a dark color or changes to pink, red, or brown. Any of the following symptoms occur over the next 10 days:  You have a temperature by mouth above 102 F (38.9 C), not controlled by medicine.  Shortness of breath.  Weakness after normal activity.  The white part of the eye turns yellow (jaundice).  You have a decrease in the amount of urine or are urinating less often.  Your urine turns a dark color or changes to pink, red, or brown. Document Released: 08/04/2000 Document Revised: 10/30/2011 Document Reviewed: 03/23/2008 [facility] Patient Information 2014 [place], [place].  _______________________________________________________________________

## 2018-06-03 ENCOUNTER — Encounter (HOSPITAL_COMMUNITY)
Admission: RE | Admit: 2018-06-03 | Discharge: 2018-06-03 | Disposition: A | Discharge status: Home / Self Care | Payer: Medicare HMO | Source: Ambulatory Visit | Attending: Orthopedic Surgery | Admitting: Orthopedic Surgery

## 2018-06-03 ENCOUNTER — Encounter (HOSPITAL_COMMUNITY): Payer: Self-pay

## 2018-06-03 ENCOUNTER — Other Ambulatory Visit: Payer: Self-pay

## 2018-06-03 DIAGNOSIS — M25551 Pain in right hip: Secondary | ICD-10-CM | POA: Insufficient documentation

## 2018-06-03 DIAGNOSIS — Z01812 Encounter for preprocedural laboratory examination: Secondary | ICD-10-CM | POA: Insufficient documentation

## 2018-06-03 DIAGNOSIS — M1611 Unilateral primary osteoarthritis, right hip: Secondary | ICD-10-CM | POA: Insufficient documentation

## 2018-06-03 HISTORY — DX: Cerebral infarction, unspecified: I63.9

## 2018-06-03 HISTORY — DX: Essential (primary) hypertension: I10

## 2018-06-03 HISTORY — DX: Personal history of colon polyps, unspecified: Z86.0100

## 2018-06-03 HISTORY — DX: Peptic ulcer, site unspecified, unspecified as acute or chronic, without hemorrhage or perforation: K27.9

## 2018-06-03 HISTORY — DX: Pure hypercholesterolemia, unspecified: E78.00

## 2018-06-03 HISTORY — DX: Personal history of urinary calculi: Z87.442

## 2018-06-03 HISTORY — DX: Personal history of colonic polyps: Z86.010

## 2018-06-03 HISTORY — DX: Unspecified osteoarthritis, unspecified site: M19.90

## 2018-06-03 LAB — BASIC METABOLIC PANEL
Anion gap: 10 (ref 5–15)
BUN: 27 mg/dL — AB (ref 8–23)
CHLORIDE: 108 mmol/L (ref 98–111)
CO2: 24 mmol/L (ref 22–32)
Calcium: 9.3 mg/dL (ref 8.9–10.3)
Creatinine, Ser: 1.04 mg/dL (ref 0.61–1.24)
GFR calc Af Amer: >60 mL/min (ref >60)
GFR calc non Af Amer: >60 mL/min (ref >60)
Glucose, Bld: 113 mg/dL — ABNORMAL HIGH (ref 70–99)
POTASSIUM: 4.6 mmol/L (ref 3.5–5.1)
SODIUM: 142 mmol/L (ref 135–145)

## 2018-06-03 LAB — CBC
HEMATOCRIT: 43.4 % (ref 39.0–52.0)
HEMOGLOBIN: 14.6 g/dL (ref 13.0–17.0)
MCH: 29.8 pg (ref 26.0–34.0)
MCHC: 33.6 g/dL (ref 30.0–36.0)
MCV: 88.6 fL (ref 80.0–100.0)
NRBC: 0 % (ref 0.0–0.2)
Platelets: 216 10*3/uL (ref 150–400)
RBC: 4.9 MIL/uL (ref 4.22–5.81)
RDW: 12.4 % (ref 11.5–15.5)
WBC: 13.1 10*3/uL — AB (ref 4.0–10.5)

## 2018-06-03 LAB — SURGICAL PCR SCREEN
MRSA, PCR: NEGATIVE
STAPHYLOCOCCUS AUREUS: NEGATIVE

## 2018-06-03 NOTE — H&P (Signed)
TOTAL HIP ADMISSION H&P  Patient is admitted for right total hip arthroplasty, anterior approach .  Subjective:  Chief Complaint:   Right hip primary OA / pain  HPI: [name], 72 y.o. male, has a history of pain and functional disability in the right hip(s) due to arthritis and patient has failed non-surgical conservative treatments for greater than 12 weeks to include NSAID's and/or analgesics, corticosteriod injections, use of assistive devices and activity modification.  Onset of symptoms was gradual starting ~1 years ago with gradually worsening course since that time.The patient noted no past surgery on the right hip(s).  Patient currently rates pain in the right hip at 6 out of 10 with activity. Patient has worsening of pain with activity and weight bearing, trendelenberg gait, pain that interfers with activities of daily living and pain with passive range of motion. Patient has evidence of periarticular osteophytes and joint space narrowing by imaging studies. This condition presents safety issues increasing the risk of falls.  There is no current active infection.  Risks, benefits and expectations were discussed with the patient.  Risks including but not limited to the risk of anesthesia, blood clots, nerve damage, blood vessel damage, failure of the prosthesis, infection and up to and including death.  Patient understand the risks, benefits and expectations and wishes to proceed with surgery.   PCP: Consuello Closs, MD  D/C Plans:       Home  Post-op Meds:       No Rx given   Tranexamic Acid:      To be given - IV   Decadron:      Is to be given  FYI:     Plavix (on pre-op) - ASA (add)  Norco  Dilaudid (ok per pt)  DME:   Pt already has equipment  PT:   No PT     Past Medical History:  Diagnosis Date  . Arthritis   . History of colon polyps   . History of kidney stones   . Hypercholesteremia   . Hypertension   . Peptic ulcer   . 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    Current Outpatient Medications  Medication Sig Dispense Refill Last Dose  . amLODipine (NORVASC) 5 MG tablet Take 5 mg by mouth daily.     . clopidogrel (PLAVIX) 75 MG tablet Take 75 mg by mouth daily.     . Flaxseed, Linseed, (FLAXSEED OIL) 1200 MG CAPS Take 1,200 mg by mouth daily.     . folic acid (FOLVITE) 834 MCG tablet Take 800 mcg by mouth daily.     . hydrochlorothiazide (HYDRODIURIL) 12.5 MG tablet Take 12.5 mg by mouth daily.     [name] lisinopril (PRINIVIL,ZESTRIL) 20 MG tablet Take 20 mg by mouth daily.     . Multiple Vitamin (MULTIVITAMIN WITH MINERALS) TABS tablet Take 1 tablet by mouth daily.     . Omega-3 Fatty Acids (FISH OIL) 1200 MG CAPS Take 1,200 mg by mouth daily.     . pravastatin (PRAVACHOL) 40 MG tablet Take 40 mg by mouth daily.      No Known Allergies   Social History   Tobacco Use  . Smoking status: Former Research scientist (life sciences)  . Smokeless tobacco: Never Used  . Tobacco comment: quit 40 years ago   Substance Use Topics  . Alcohol use: Yes  Comment: moderate  ; 2-3 beers each weekend        Review of Systems  Constitutional: Negative.   HENT: Negative.   Eyes: Negative.   Respiratory: Negative.   Cardiovascular: Negative.   Gastrointestinal: Negative.   Genitourinary: Negative.   Musculoskeletal: Positive for joint pain.  Skin: Negative.   Neurological: Negative.   Endo/Heme/Allergies: Negative.   Psychiatric/Behavioral: Negative.     Objective:  Physical Exam  Constitutional: He is oriented to person, place, and time. He appears well-developed.  HENT:  Head: Normocephalic.  Eyes: Pupils are equal, round, and reactive to light.  Neck: Neck supple. No JVD present. No tracheal deviation present. No thyromegaly present.  Cardiovascular: Normal rate, regular rhythm and intact distal pulses.  Murmur heard. Respiratory: Effort  normal and breath sounds normal. No respiratory distress. He has no wheezes.  GI: Soft. There is no tenderness. There is no guarding.  Musculoskeletal:       Right hip: He exhibits decreased range of motion, decreased strength, tenderness and bony tenderness. He exhibits no swelling, no deformity and no laceration.  Lymphadenopathy:    He has no cervical adenopathy.  Neurological: He is alert and oriented to person, place, and time.  Skin: Skin is warm and dry.  Psychiatric: He has a normal mood and affect.    Vital signs in last 24 hours: Temp:  [98.4 F (36.9 C)] 98.4 F (36.9 C) (10/14 0749) Pulse Rate:  [57] 57 (10/14 0749) Resp:  [16] 16 (10/14 0749) BP: (167)/(82) 167/82 (10/14 0749) SpO2:  [93 %] 93 % (10/14 0749) Weight:  [701 kg] 112 kg (10/14 0749)  Labs:   Estimated body mass index is 34.45 kg/m as calculated from the following:   Height as of 06/03/18: 5\' 11"  (1.803 m).   Weight as of 06/03/18: 112 kg.   Imaging Review Plain radiographs demonstrate severe degenerative joint disease of the right hip. The bone quality appears to be good for age and reported activity level.    Preoperative templating of the joint replacement has been completed, documented, and submitted to the Operating Room personnel in order to optimize intra-operative equipment management.     Assessment/Plan:  End stage arthritis, right hip  The patient history, physical examination, clinical judgement of the provider and imaging studies are consistent with end stage degenerative joint disease of the right hip and total hip arthroplasty is deemed medically necessary. The treatment options including medical management, injection therapy, arthroscopy and arthroplasty were discussed at length. The risks and benefits of total hip arthroplasty were presented and reviewed. The risks due to aseptic loosening, infection, stiffness, dislocation/subluxation,  thromboembolic complications and other  imponderables were discussed.  The patient acknowledged the explanation, agreed to proceed with the plan and consent was signed. Patient is being admitted for inpatient treatment for surgery, pain control, PT, OT, prophylactic antibiotics, VTE prophylaxis, progressive ambulation and ADL's and discharge planning.The patient is planning to be discharged home.    [place] Fredna Stricker   PA-C  06/03/2018, 9:43 AM

## 2018-06-03 NOTE — Progress Notes (Signed)
Bmp routed via epic to dr Alvan Dame

## 2018-06-18 ENCOUNTER — Inpatient Hospital Stay (HOSPITAL_COMMUNITY): Payer: Medicare HMO

## 2018-06-18 ENCOUNTER — Encounter (HOSPITAL_COMMUNITY)
Admission: RE | Disposition: A | Discharge status: Home / Self Care | Payer: Self-pay | Source: Home / Self Care | Attending: Orthopedic Surgery

## 2018-06-18 ENCOUNTER — Encounter (HOSPITAL_COMMUNITY): Payer: Self-pay | Admitting: *Deleted

## 2018-06-18 ENCOUNTER — Inpatient Hospital Stay (HOSPITAL_COMMUNITY): Payer: Medicare HMO | Admitting: Anesthesiology

## 2018-06-18 ENCOUNTER — Other Ambulatory Visit: Payer: Self-pay

## 2018-06-18 ENCOUNTER — Inpatient Hospital Stay (HOSPITAL_COMMUNITY)
Admission: RE | Admit: 2018-06-18 | Discharge: 2018-06-19 | DRG: 470 | Disposition: A | Discharge status: Home / Self Care | Payer: Medicare HMO | Attending: Orthopedic Surgery | Admitting: Orthopedic Surgery

## 2018-06-18 DIAGNOSIS — I10 Essential (primary) hypertension: Secondary | ICD-10-CM | POA: Diagnosis not present

## 2018-06-18 DIAGNOSIS — Z96641 Presence of right artificial hip joint: Secondary | ICD-10-CM

## 2018-06-18 DIAGNOSIS — M25751 Osteophyte, right hip: Secondary | ICD-10-CM | POA: Diagnosis present

## 2018-06-18 DIAGNOSIS — E669 Obesity, unspecified: Secondary | ICD-10-CM | POA: Diagnosis not present

## 2018-06-18 DIAGNOSIS — Z79899 Other long term (current) drug therapy: Secondary | ICD-10-CM

## 2018-06-18 DIAGNOSIS — Z96649 Presence of unspecified artificial hip joint: Secondary | ICD-10-CM

## 2018-06-18 DIAGNOSIS — I69393 Ataxia following cerebral infarction: Secondary | ICD-10-CM | POA: Diagnosis not present

## 2018-06-18 DIAGNOSIS — M1611 Unilateral primary osteoarthritis, right hip: Principal | ICD-10-CM | POA: Diagnosis present

## 2018-06-18 DIAGNOSIS — Z7902 Long term (current) use of antithrombotics/antiplatelets: Secondary | ICD-10-CM | POA: Diagnosis not present

## 2018-06-18 DIAGNOSIS — Z87891 Personal history of nicotine dependence: Secondary | ICD-10-CM

## 2018-06-18 DIAGNOSIS — Z8719 Personal history of other diseases of the digestive system: Secondary | ICD-10-CM

## 2018-06-18 DIAGNOSIS — Z8711 Personal history of peptic ulcer disease: Secondary | ICD-10-CM

## 2018-06-18 DIAGNOSIS — Z471 Aftercare following joint replacement surgery: Secondary | ICD-10-CM | POA: Diagnosis not present

## 2018-06-18 DIAGNOSIS — E7800 Pure hypercholesterolemia, unspecified: Secondary | ICD-10-CM | POA: Diagnosis present

## 2018-06-18 HISTORY — PX: TOTAL HIP ARTHROPLASTY: SHX124

## 2018-06-18 LAB — PROTIME-INR
INR: 0.92
Prothrombin Time: 12.3 seconds (ref 11.4–15.2)

## 2018-06-18 LAB — TYPE AND SCREEN
ABO/RH(D): O POS
ANTIBODY SCREEN: NEGATIVE

## 2018-06-18 LAB — ABO/RH: ABO/RH(D): O POS

## 2018-06-18 SURGERY — ARTHROPLASTY, HIP, TOTAL, ANTERIOR APPROACH
Anesthesia: Spinal | Site: Hip | Laterality: Right

## 2018-06-18 MED ORDER — ASPIRIN 81 MG PO CHEW
81.0000 mg | CHEWABLE_TABLET | Freq: Every day | ORAL | Status: DC
  Administered 2018-06-19: 81 mg via ORAL
  Filled 2018-06-18: qty 1

## 2018-06-18 MED ORDER — FENTANYL CITRATE (PF) 100 MCG/2ML IJ SOLN
25.0000 ug | INTRAMUSCULAR | Status: DC | PRN
  Administered 2018-06-18 (×2): 50 ug via INTRAVENOUS

## 2018-06-18 MED ORDER — DEXAMETHASONE SODIUM PHOSPHATE 10 MG/ML IJ SOLN: 10.0000 mg | Freq: Once | INTRAMUSCULAR | Status: DC

## 2018-06-18 MED ORDER — LIDOCAINE HCL (CARDIAC) PF 100 MG/5ML IV SOSY
PREFILLED_SYRINGE | INTRAVENOUS | Status: DC | PRN
  Administered 2018-06-18: 40 mg via INTRAVENOUS

## 2018-06-18 MED ORDER — CLOPIDOGREL BISULFATE 75 MG PO TABS
75.0000 mg | ORAL_TABLET | Freq: Every day | ORAL | Status: DC
  Administered 2018-06-18 – 2018-06-19 (×2): 75 mg via ORAL
  Filled 2018-06-18 (×2): qty 1

## 2018-06-18 MED ORDER — BUPIVACAINE IN DEXTROSE 0.75-8.25 % IT SOLN
INTRATHECAL | Status: DC | PRN
  Administered 2018-06-18: 2 mL via INTRATHECAL

## 2018-06-18 MED ORDER — DIPHENHYDRAMINE HCL 12.5 MG/5ML PO ELIX: 12.5000 mg | ORAL_SOLUTION | ORAL | Status: DC | PRN

## 2018-06-18 MED ORDER — FERROUS SULFATE 325 (65 FE) MG PO TABS: 325.0000 mg | ORAL_TABLET | Freq: Three times a day (TID) | ORAL | 3 refills | Status: DC

## 2018-06-18 MED ORDER — METHOCARBAMOL 500 MG PO TABS: 500.0000 mg | ORAL_TABLET | Freq: Four times a day (QID) | ORAL | 0 refills | Status: DC | PRN

## 2018-06-18 MED ORDER — SODIUM CHLORIDE 0.9 % IR SOLN
Status: DC | PRN
  Administered 2018-06-18: 1000 mL

## 2018-06-18 MED ORDER — ACETAMINOPHEN 325 MG PO TABS: 325.0000 mg | ORAL_TABLET | Freq: Four times a day (QID) | ORAL | Status: DC | PRN

## 2018-06-18 MED ORDER — ACETAMINOPHEN 10 MG/ML IV SOLN
INTRAVENOUS | Status: AC
  Filled 2018-06-18: qty 100

## 2018-06-18 MED ORDER — SODIUM CHLORIDE 0.9 % IV SOLN
INTRAVENOUS | Status: DC
  Administered 2018-06-18 – 2018-06-19 (×2): via INTRAVENOUS

## 2018-06-18 MED ORDER — POLYETHYLENE GLYCOL 3350 17 G PO PACK
17.0000 g | PACK | Freq: Two times a day (BID) | ORAL | Status: DC
  Administered 2018-06-18 – 2018-06-19 (×2): 17 g via ORAL
  Filled 2018-06-18 (×2): qty 1

## 2018-06-18 MED ORDER — HYDROCODONE-ACETAMINOPHEN 7.5-325 MG PO TABS: 1.0000 | ORAL_TABLET | ORAL | Status: DC | PRN

## 2018-06-18 MED ORDER — PROPOFOL 500 MG/50ML IV EMUL
INTRAVENOUS | Status: DC | PRN
  Administered 2018-06-18: 50 ug/kg/min via INTRAVENOUS

## 2018-06-18 MED ORDER — BISACODYL 10 MG RE SUPP: 10.0000 mg | Freq: Every day | RECTAL | Status: DC | PRN

## 2018-06-18 MED ORDER — PROPOFOL 10 MG/ML IV BOLUS
INTRAVENOUS | Status: DC | PRN
  Administered 2018-06-18: 20 mg via INTRAVENOUS

## 2018-06-18 MED ORDER — LIDOCAINE 2% (20 MG/ML) 5 ML SYRINGE
INTRAMUSCULAR | Status: AC
  Filled 2018-06-18: qty 5

## 2018-06-18 MED ORDER — METHOCARBAMOL 500 MG PO TABS
500.0000 mg | ORAL_TABLET | Freq: Four times a day (QID) | ORAL | Status: DC | PRN
  Administered 2018-06-19: 500 mg via ORAL
  Filled 2018-06-18 (×2): qty 1

## 2018-06-18 MED ORDER — PRAVASTATIN SODIUM 20 MG PO TABS
40.0000 mg | ORAL_TABLET | Freq: Every day | ORAL | Status: DC
  Administered 2018-06-18 – 2018-06-19 (×2): 40 mg via ORAL
  Filled 2018-06-18 (×2): qty 2

## 2018-06-18 MED ORDER — HYDROMORPHONE HCL 1 MG/ML IJ SOLN
INTRAMUSCULAR | Status: AC
  Filled 2018-06-18: qty 1

## 2018-06-18 MED ORDER — AMLODIPINE BESYLATE 5 MG PO TABS
5.0000 mg | ORAL_TABLET | Freq: Every day | ORAL | Status: DC
  Administered 2018-06-19: 5 mg via ORAL
  Filled 2018-06-18: qty 1

## 2018-06-18 MED ORDER — FERROUS SULFATE 325 (65 FE) MG PO TABS
325.0000 mg | ORAL_TABLET | Freq: Three times a day (TID) | ORAL | Status: DC
  Administered 2018-06-18 – 2018-06-19 (×4): 325 mg via ORAL
  Filled 2018-06-18 (×4): qty 1

## 2018-06-18 MED ORDER — HYDROCODONE-ACETAMINOPHEN 5-325 MG PO TABS
1.0000 | ORAL_TABLET | ORAL | Status: DC | PRN
  Administered 2018-06-18 – 2018-06-19 (×5): 2 via ORAL
  Filled 2018-06-18 (×6): qty 2

## 2018-06-18 MED ORDER — CELECOXIB 200 MG PO CAPS
200.0000 mg | ORAL_CAPSULE | Freq: Two times a day (BID) | ORAL | Status: DC
  Administered 2018-06-18 – 2018-06-19 (×2): 200 mg via ORAL
  Filled 2018-06-18 (×2): qty 1

## 2018-06-18 MED ORDER — FENTANYL CITRATE (PF) 100 MCG/2ML IJ SOLN
INTRAMUSCULAR | Status: AC
  Filled 2018-06-18: qty 2

## 2018-06-18 MED ORDER — PROMETHAZINE HCL 25 MG/ML IJ SOLN: 6.2500 mg | INTRAMUSCULAR | Status: DC | PRN

## 2018-06-18 MED ORDER — ONDANSETRON HCL 4 MG/2ML IJ SOLN: 4.0000 mg | Freq: Four times a day (QID) | INTRAMUSCULAR | Status: DC | PRN

## 2018-06-18 MED ORDER — CEFAZOLIN SODIUM-DEXTROSE 2-4 GM/100ML-% IV SOLN
2.0000 g | INTRAVENOUS | Status: AC
  Administered 2018-06-18: 2 g via INTRAVENOUS
  Filled 2018-06-18: qty 100

## 2018-06-18 MED ORDER — MAGNESIUM CITRATE PO SOLN: 1.0000 | Freq: Once | ORAL | Status: DC | PRN

## 2018-06-18 MED ORDER — DOCUSATE SODIUM 100 MG PO CAPS: 100.0000 mg | ORAL_CAPSULE | Freq: Two times a day (BID) | ORAL | 0 refills | Status: DC

## 2018-06-18 MED ORDER — METHOCARBAMOL 500 MG IVPB - SIMPLE MED
INTRAVENOUS | Status: AC
  Filled 2018-06-18: qty 50

## 2018-06-18 MED ORDER — METOCLOPRAMIDE HCL 5 MG PO TABS: 5.0000 mg | ORAL_TABLET | Freq: Three times a day (TID) | ORAL | Status: DC | PRN

## 2018-06-18 MED ORDER — MENTHOL 3 MG MT LOZG: 1.0000 | LOZENGE | OROMUCOSAL | Status: DC | PRN

## 2018-06-18 MED ORDER — EPHEDRINE 5 MG/ML INJ
INTRAVENOUS | Status: AC
  Filled 2018-06-18: qty 10

## 2018-06-18 MED ORDER — METHOCARBAMOL 500 MG IVPB - SIMPLE MED
500.0000 mg | Freq: Four times a day (QID) | INTRAVENOUS | Status: DC | PRN
  Administered 2018-06-18: 500 mg via INTRAVENOUS
  Filled 2018-06-18: qty 50

## 2018-06-18 MED ORDER — HYDROMORPHONE HCL 1 MG/ML IJ SOLN
0.2500 mg | INTRAMUSCULAR | Status: DC | PRN
  Administered 2018-06-18 (×4): 0.5 mg via INTRAVENOUS

## 2018-06-18 MED ORDER — LACTATED RINGERS IV SOLN
INTRAVENOUS | Status: DC
  Administered 2018-06-18 (×2): via INTRAVENOUS

## 2018-06-18 MED ORDER — HYDROMORPHONE HCL 1 MG/ML IJ SOLN: 0.5000 mg | INTRAMUSCULAR | Status: DC | PRN

## 2018-06-18 MED ORDER — TRANEXAMIC ACID-NACL 1000-0.7 MG/100ML-% IV SOLN
1000.0000 mg | Freq: Once | INTRAVENOUS | Status: AC
  Administered 2018-06-18: 1000 mg via INTRAVENOUS
  Filled 2018-06-18: qty 100

## 2018-06-18 MED ORDER — FENTANYL CITRATE (PF) 100 MCG/2ML IJ SOLN
INTRAMUSCULAR | Status: DC | PRN
  Administered 2018-06-18: 100 ug via INTRAVENOUS

## 2018-06-18 MED ORDER — ASPIRIN 81 MG PO CHEW: 81.0000 mg | CHEWABLE_TABLET | Freq: Every day | ORAL | 0 refills | Status: AC

## 2018-06-18 MED ORDER — DOCUSATE SODIUM 100 MG PO CAPS
100.0000 mg | ORAL_CAPSULE | Freq: Two times a day (BID) | ORAL | Status: DC
  Administered 2018-06-18 – 2018-06-19 (×2): 100 mg via ORAL
  Filled 2018-06-18 (×2): qty 1

## 2018-06-18 MED ORDER — EPHEDRINE SULFATE-NACL 50-0.9 MG/10ML-% IV SOSY
PREFILLED_SYRINGE | INTRAVENOUS | Status: DC | PRN
  Administered 2018-06-18: 10 mg via INTRAVENOUS
  Administered 2018-06-18 (×3): 5 mg via INTRAVENOUS

## 2018-06-18 MED ORDER — SODIUM CHLORIDE 0.9 % IV SOLN
INTRAVENOUS | Status: DC | PRN
  Administered 2018-06-18: 40 ug/min via INTRAVENOUS

## 2018-06-18 MED ORDER — HYDROCODONE-ACETAMINOPHEN 7.5-325 MG PO TABS: 1.0000 | ORAL_TABLET | ORAL | 0 refills | Status: DC | PRN

## 2018-06-18 MED ORDER — CEFAZOLIN SODIUM-DEXTROSE 2-4 GM/100ML-% IV SOLN
2.0000 g | Freq: Four times a day (QID) | INTRAVENOUS | Status: AC
  Administered 2018-06-18 (×2): 2 g via INTRAVENOUS
  Filled 2018-06-18 (×2): qty 100

## 2018-06-18 MED ORDER — DEXAMETHASONE SODIUM PHOSPHATE 10 MG/ML IJ SOLN
10.0000 mg | Freq: Once | INTRAMUSCULAR | Status: AC
  Administered 2018-06-19: 10 mg via INTRAVENOUS
  Filled 2018-06-18: qty 1

## 2018-06-18 MED ORDER — HYDROCHLOROTHIAZIDE 25 MG PO TABS
12.5000 mg | ORAL_TABLET | Freq: Every day | ORAL | Status: DC
  Administered 2018-06-18 – 2018-06-19 (×2): 12.5 mg via ORAL
  Filled 2018-06-18 (×2): qty 1

## 2018-06-18 MED ORDER — PROPOFOL 10 MG/ML IV BOLUS
INTRAVENOUS | Status: AC
  Filled 2018-06-18: qty 60

## 2018-06-18 MED ORDER — ONDANSETRON HCL 4 MG PO TABS: 4.0000 mg | ORAL_TABLET | Freq: Four times a day (QID) | ORAL | Status: DC | PRN

## 2018-06-18 MED ORDER — STERILE WATER FOR IRRIGATION IR SOLN
Status: DC | PRN
  Administered 2018-06-18: 2000 mL

## 2018-06-18 MED ORDER — PHENYLEPHRINE HCL 10 MG/ML IJ SOLN
INTRAMUSCULAR | Status: AC
  Filled 2018-06-18: qty 1

## 2018-06-18 MED ORDER — ALUM & MAG HYDROXIDE-SIMETH 200-200-20 MG/5ML PO SUSP: 15.0000 mL | ORAL | Status: DC | PRN

## 2018-06-18 MED ORDER — PHENOL 1.4 % MT LIQD
1.0000 | OROMUCOSAL | Status: DC | PRN
  Filled 2018-06-18: qty 177

## 2018-06-18 MED ORDER — METOCLOPRAMIDE HCL 5 MG/ML IJ SOLN: 5.0000 mg | Freq: Three times a day (TID) | INTRAMUSCULAR | Status: DC | PRN

## 2018-06-18 MED ORDER — ACETAMINOPHEN 10 MG/ML IV SOLN
1000.0000 mg | Freq: Once | INTRAVENOUS | Status: AC
  Administered 2018-06-18: 1000 mg via INTRAVENOUS

## 2018-06-18 MED ORDER — TRANEXAMIC ACID-NACL 1000-0.7 MG/100ML-% IV SOLN
1000.0000 mg | INTRAVENOUS | Status: AC
  Administered 2018-06-18: 1000 mg via INTRAVENOUS
  Filled 2018-06-18: qty 100

## 2018-06-18 MED ORDER — CHLORHEXIDINE GLUCONATE 4 % EX LIQD: 60.0000 mL | Freq: Once | CUTANEOUS | Status: DC

## 2018-06-18 MED ORDER — POLYETHYLENE GLYCOL 3350 17 G PO PACK: 17.0000 g | PACK | Freq: Two times a day (BID) | ORAL | 0 refills | Status: DC

## 2018-06-18 SURGICAL SUPPLY — 43 items
ADH SKN CLS APL DERMABOND .7 (GAUZE/BANDAGES/DRESSINGS) ×1
BAG DECANTER FOR FLEXI CONT (MISCELLANEOUS) IMPLANT
BAG SPEC THK2 15X12 ZIP CLS (MISCELLANEOUS)
BAG ZIPLOCK 12X15 (MISCELLANEOUS) IMPLANT
BLADE SAG 18X100X1.27 (BLADE) ×3 IMPLANT
COVER PERINEAL POST (MISCELLANEOUS) ×3 IMPLANT
COVER SURGICAL LIGHT HANDLE (MISCELLANEOUS) ×3 IMPLANT
COVER WAND RF STERILE (DRAPES) ×2 IMPLANT
CUP ACET PINNACLE SECTR 56MM (Hips) IMPLANT
DERMABOND ADVANCED (GAUZE/BANDAGES/DRESSINGS) ×2
DERMABOND ADVANCED .7 DNX12 (GAUZE/BANDAGES/DRESSINGS) ×1 IMPLANT
DRAPE STERI IOBAN 125X83 (DRAPES) ×3 IMPLANT
DRAPE U-SHAPE 47X51 STRL (DRAPES) ×6 IMPLANT
DRESSING AQUACEL AG SP 3.5X10 (GAUZE/BANDAGES/DRESSINGS) ×1 IMPLANT
DRSG AQUACEL AG SP 3.5X10 (GAUZE/BANDAGES/DRESSINGS) ×3
DURAPREP 26ML APPLICATOR (WOUND CARE) ×3 IMPLANT
ELECT REM PT RETURN 15FT ADLT (MISCELLANEOUS) ×3 IMPLANT
ELIMINATOR HOLE APEX DEPUY (Hips) ×2 IMPLANT
GLOVE BIOGEL M STRL SZ7.5 (GLOVE) ×2 IMPLANT
GLOVE BIOGEL PI IND STRL 7.5 (GLOVE) ×1 IMPLANT
GLOVE BIOGEL PI IND STRL 8.5 (GLOVE) ×1 IMPLANT
GLOVE BIOGEL PI INDICATOR 7.5 (GLOVE) ×4
GLOVE BIOGEL PI INDICATOR 8.5 (GLOVE)
GLOVE ECLIPSE 8.0 STRL XLNG CF (GLOVE) ×2 IMPLANT
GLOVE ORTHO TXT STRL SZ7.5 (GLOVE) ×5 IMPLANT
GOWN STRL REUS W/TWL 2XL LVL3 (GOWN DISPOSABLE) ×3 IMPLANT
GOWN STRL REUS W/TWL LRG LVL3 (GOWN DISPOSABLE) ×3 IMPLANT
HEAD CERAMIC DELTA 36 PLUS 1.5 (Hips) ×2 IMPLANT
HOLDER FOLEY CATH W/STRAP (MISCELLANEOUS) ×3 IMPLANT
PACK ANTERIOR HIP CUSTOM (KITS) ×3 IMPLANT
PINNACLE ALTRX PLUS 4 N 36X56 (Hips) ×2 IMPLANT
PINNACLE SECTOR CUP 56MM (Hips) ×3 IMPLANT
SCREW 6.5MMX25MM (Screw) ×2 IMPLANT
STEM FEM ACTIS HIGH SZ7 (Stem) ×2 IMPLANT
SUT MNCRL AB 4-0 PS2 18 (SUTURE) ×3 IMPLANT
SUT STRATAFIX 0 PDS 27 VIOLET (SUTURE) ×3
SUT VIC AB 1 CT1 36 (SUTURE) ×9 IMPLANT
SUT VIC AB 2-0 CT1 27 (SUTURE) ×6
SUT VIC AB 2-0 CT1 TAPERPNT 27 (SUTURE) ×2 IMPLANT
SUTURE STRATFX 0 PDS 27 VIOLET (SUTURE) ×1 IMPLANT
TRAY FOLEY MTR SLVR 16FR STAT (SET/KITS/TRAYS/PACK) ×2 IMPLANT
WATER STERILE IRR 1000ML POUR (IV SOLUTION) ×3 IMPLANT
YANKAUER SUCT BULB TIP 10FT TU (MISCELLANEOUS) ×2 IMPLANT

## 2018-06-18 NOTE — Transfer of Care (Signed)
Immediate Anesthesia Transfer of Care Note  Patient: [name]  Procedure(s) Performed: RIGHT TOTAL HIP ARTHROPLASTY ANTERIOR APPROACH (Right Hip)  Patient Location: PACU  Anesthesia Type:Spinal  Level of Consciousness: drowsy and patient cooperative  Airway & Oxygen Therapy: Patient Spontanous Breathing and Patient connected to face mask oxygen  Post-op Assessment: Report given to RN and Post -op Vital signs reviewed and stable  Post vital signs: Reviewed and stable  Last Vitals:  Vitals Value Taken Time  BP 104/55 06/18/2018 10:30 AM  Temp    Pulse 77 06/18/2018 10:30 AM  Resp 11 06/18/2018 10:30 AM  SpO2 97 % 06/18/2018 10:30 AM  Vitals shown include unvalidated device data.  Last Pain:  Vitals:   06/18/18 0637  TempSrc: Oral      Patients Stated Pain Goal: 4 (78/29/56 2130)  Complications: No apparent anesthesia complications

## 2018-06-18 NOTE — Evaluation (Signed)
Physical Therapy Evaluation Patient Details Name: [name] MRN: [id number] DOB: July 10, 1946 Today's Date: 06/18/2018   History of Present Illness  72 YO male s/p R DA-THA on 06/08/18. PMH includes OA, kidney stones, HTN, R cerebellar stroke 2010.   Clinical Impression  Pt presents with R hip pain especially with WB, difficulty performing mobility tasks, and decreased tolerance for ambulation due to pain. Pt to benefit from acute PT to address deficits. Pt ambulated 11 ft with RW with min guard assist. PT to progress mobility as tolerated, and will continue to follow acutely.      Follow Up Recommendations Follow surgeon's recommendation for DC plan and follow-up therapies;Supervision for mobility/OOB(HEP)    Equipment Recommendations  Rolling walker with 5" wheels    Recommendations for Other Services       Precautions / Restrictions Precautions Precautions: Fall Restrictions Weight Bearing Restrictions: No RLE Weight Bearing: Weight bearing as tolerated      Mobility  Bed Mobility Overal bed mobility: Needs Assistance Bed Mobility: Supine to Sit     Supine to sit: Min assist;HOB elevated     General bed mobility comments: Min assist for RLE management, verbal cuing on scooting to EOB.   Transfers Overall transfer level: Needs assistance Equipment used: Rolling walker (2 wheeled) Transfers: Sit to/from Stand Sit to Stand: Min assist;From elevated surface         General transfer comment: Min assist for power up and steadying. Verbal cuing for hand placement.   Ambulation/Gait Ambulation/Gait assistance: Min guard;+2 safety/equipment(chair follow ) Gait Distance (Feet): 70 Feet Assistive device: Rolling walker (2 wheeled) Gait Pattern/deviations: Step-to pattern;Decreased stride length;Decreased weight shift to right;Antalgic;Decreased stance time - right Gait velocity: decr    General Gait Details: Min guard for safety. Verbal cuing for placement in RW,  sequencing, turning.   Stairs            Wheelchair Mobility    Modified Rankin (Stroke Patients Only)       Balance Overall balance assessment: Mild deficits observed, not formally tested                                           Pertinent Vitals/Pain Pain Assessment: 0-10 Pain Score: 3  Pain Location: R hip  Pain Descriptors / Indicators: Aching Pain Intervention(s): Limited activity within patient's tolerance;Repositioned;Ice applied;Monitored during session    [place] expects to be discharged to:: Private residence Living Arrangements: Spouse/significant other Available Help at Discharge: Family;Available PRN/intermittently Type of Home: House Home Access: Stairs to enter Entrance Stairs-Rails: None Entrance Stairs-Number of Steps: 1-6" step  Home Layout: One level Home Equipment: Cane - single point;Walker - 4 wheels      Prior Function Level of Independence: Independent with assistive device(s)         Comments: using cane prior to admission for mobility      Hand Dominance   Dominant Hand: Right    Extremity/Trunk Assessment   Upper Extremity Assessment Upper Extremity Assessment: Overall WFL for tasks assessed    Lower Extremity Assessment Lower Extremity Assessment: Overall WFL for tasks assessed;RLE deficits/detail RLE Deficits / Details: able to perform weak quad set, ankle pumps, SLR with assist  RLE Sensation: WNL    Cervical / Trunk Assessment Cervical / Trunk Assessment: Normal  Communication   Communication: No difficulties  Cognition Arousal/Alertness: Awake/alert Behavior During Therapy: WFL for tasks  assessed/performed Overall Cognitive Status: Within Functional Limits for tasks assessed                                        General Comments      Exercises Total Joint Exercises Ankle Circles/Pumps: AROM;Both;5 reps;Supine   Assessment/Plan    PT Assessment  Patient needs continued PT services  PT Problem List Decreased strength;Pain;Decreased activity tolerance;Decreased balance;Decreased knowledge of use of DME;Decreased mobility       PT Treatment Interventions DME instruction;Therapeutic activities;Therapeutic exercise;Gait training;Patient/family education;Stair training;Functional mobility training;Balance training    PT Goals (Current goals can be found in the Care Plan section)  Acute Rehab PT Goals PT Goal Formulation: With patient Time For Goal Achievement: 06/25/18 Potential to Achieve Goals: Good    Frequency 7X/week   Barriers to discharge        Co-evaluation               AM-PAC PT "6 Clicks" Daily Activity  Outcome Measure Difficulty turning over in bed (including adjusting bedclothes, sheets and blankets)?: Unable Difficulty moving from lying on back to sitting on the side of the bed? : Unable Difficulty sitting down on and standing up from a chair with arms (e.g., wheelchair, bedside commode, etc,.)?: Unable Help needed moving to and from a bed to chair (including a wheelchair)?: A Little Help needed walking in hospital room?: A Little Help needed climbing 3-5 steps with a railing? : A Little 6 Click Score: 12    End of Session Equipment Utilized During Treatment: Gait belt Activity Tolerance: Patient tolerated treatment well Patient left: in bed;with bed alarm set;with call bell/phone within reach;with family/visitor present;with SCD's reapplied Nurse Communication: Mobility status PT Visit Diagnosis: Other abnormalities of gait and mobility (R26.89);Difficulty in walking, not elsewhere classified (R26.2)    Time: 7416-3845 PT Time Calculation (min) (ACUTE ONLY): 28 min   Charges:   PT Evaluation $PT Eval Low Complexity: 1 Low PT Treatments $Gait Training: 8-22 mins        Julien Girt, PT Acute Rehabilitation Services Pager [phone]  Office [phone]   Devonne Kitchen D Elonda Husky 06/18/2018, 6:51  PM

## 2018-06-18 NOTE — Interval H&P Note (Signed)
History and Physical Interval Note:  06/18/2018 6:35 AM  [name]  has presented today for surgery, with the diagnosis of Right hip osteoarthritis  The various methods of treatment have been discussed with the patient and family. After consideration of risks, benefits and other options for treatment, the patient has consented to  Procedure(s): RIGHT TOTAL HIP ARTHROPLASTY ANTERIOR APPROACH (Right) as a surgical intervention .  The patient's history has been reviewed, patient examined, no change in status, stable for surgery.  I have reviewed the patient's chart and labs.  Questions were answered to the patient's satisfaction.     Mauri Pole

## 2018-06-18 NOTE — Op Note (Signed)
NAME:  [name]                ACCOUNT NO.: [id number]      MEDICAL RECORD NO.: [id number]      FACILITY:  [facility]      PHYSICIAN:  Mauri Pole  DATE OF BIRTH:  [birth year]     DATE OF PROCEDURE:  06/18/2018                                 OPERATIVE REPORT         PREOPERATIVE DIAGNOSIS: Right  hip osteoarthritis.      POSTOPERATIVE DIAGNOSIS:  Right hip osteoarthritis.      PROCEDURE:  Right total hip replacement through an anterior approach   utilizing DePuy THR system, component size 26mm pinnacle cup, a size 36+4 neutral   Altrex liner, a size 7 Hi Actis femoral stem with a 36+1.5 delta ceramic   ball.      SURGEON:  Pietro Cassis. Alvan Dame, M.D.      ASSISTANT:  Nehemiah Massed, PA-C     ANESTHESIA:  Spinal.      SPECIMENS:  None.      COMPLICATIONS:  None.      BLOOD LOSS:  250 cc     DRAINS:  None.      INDICATION OF THE PROCEDURE:  [name] is a 72 y.o. male who had   presented to office for evaluation of right hip pain.  Radiographs revealed   progressive degenerative changes with bone-on-bone   articulation of the  hip joint, including subchondral cystic changes and osteophytes.  The patient had painful limited range of   motion significantly affecting their overall quality of life and function.  The patient was failing to    respond to conservative measures including medications and/or injections and activity modification and at this point was ready   to proceed with more definitive measures.  Consent was obtained for   benefit of pain relief.  Specific risks of infection, DVT, component   failure, dislocation, neurovascular injury, and need for revision surgery were reviewed in the office as well discussion of   the anterior versus posterior approach were reviewed.     PROCEDURE IN DETAIL:  The patient was brought to operative theater.   Once adequate anesthesia, preoperative antibiotics, 2 gm of Ancef, 1 gm of Tranexamic Acid, and 10 mg  of Decadron were administered, the patient was positioned supine on the [employer] table.  Once the patient was safely positioned with adequate padding of boney prominences we predraped out the hip, and used fluoroscopy to confirm orientation of the pelvis.      The right hip was then prepped and draped from proximal iliac crest to   mid thigh with a shower curtain technique.      Time-out was performed identifying the patient, planned procedure, and the appropriate extremity.     An incision was then made 2 cm lateral to the   anterior superior iliac spine extending over the orientation of the   tensor fascia lata muscle and sharp dissection was carried down to the   fascia of the muscle.      The fascia was then incised.  The muscle belly was identified and swept   laterally and retractor placed along the superior neck.  Following   cauterization of the circumflex vessels and removing some pericapsular  fat, a second cobra retractor was placed on the inferior neck.  A T-capsulotomy was made along the line of the   superior neck to the trochanteric fossa, then extended proximally and   distally.  Tag sutures were placed and the retractors were then placed   intracapsular.  We then identified the trochanteric fossa and   orientation of my neck cut and then made a neck osteotomy with the femur on traction.  The femoral   head was removed without difficulty or complication.  Traction was let   off and retractors were placed posterior and anterior around the   acetabulum.      The labrum and foveal tissue were debrided.  I began reaming with a 46 mm   reamer and reamed up to 55 mm reamer with good bony bed preparation and a 56 mm  cup was chosen.  The final 56 mm Pinnacle cup was then impacted under fluoroscopy to confirm the depth of penetration and orientation with respect to   Abduction and forward flexion.  A screw was placed into the ilium followed by the hole eliminator.  The final    36+4 neutral Altrex liner was impacted with good visualized rim fit.  The cup was positioned anatomically within the acetabular portion of the pelvis.      At this point, the femur was rolled to 100 degrees.  Further capsule was   released off the inferior aspect of the femoral neck.  I then   released the superior capsule proximally.  With the leg in a neutral position the hook was placed laterally   along the femur under the vastus lateralis origin and elevated manually and then held in position using the hook attachment on the bed.  The leg was then extended and adducted with the leg rolled to 100   degrees of external rotation.  Retractors were placed along the medial calcar and posteriorly over the greater trochanter.  Once the proximal femur was fully   exposed, I used a box osteotome to set orientation.  I then began   broaching with the starting chili pepper broach and passed this by hand and then broached up to 7.  With the 7 broach in place I chose a high offset neck and did several trial reductions.  The offset was appropriate, leg lengths   appeared to be equal best matched with the +1.5 head ball trial confirmed radiographically.   Given these findings, I went ahead and dislocated the hip, repositioned all   retractors and positioned the right hip in the extended and abducted position.  The final 7 Hi Actis femoral stem was   chosen and it was impacted down to the level of neck cut.  Based on this   and the trial reductions, a final 36+1.5 delta ceramic ball was chosen and   impacted onto a clean and dry trunnion, and the hip was reduced.  The   hip had been irrigated throughout the case again at this point.  I did   reapproximate the superior capsular leaflet to the anterior leaflet   using #1 Vicryl.  The fascia of the   tensor fascia lata muscle was then reapproximated using #1 Vicryl and #0 Stratafix sutures.  The   remaining wound was closed with 2-0 Vicryl and running 4-0  Monocryl.   The hip was cleaned, dried, and dressed sterilely using Dermabond and   Aquacel dressing.  The patient was then brought   to recovery room  in stable condition tolerating the procedure well.    Nehemiah Massed, PA-C was present for the entirety of the case involved from   preoperative positioning, perioperative retractor management, general   facilitation of the case, as well as primary wound closure as assistant.            Pietro Cassis Alvan Dame, M.D.        06/18/2018 9:52 AM

## 2018-06-18 NOTE — Anesthesia Postprocedure Evaluation (Signed)
Anesthesia Post Note  Patient: [name]  Procedure(s) Performed: RIGHT TOTAL HIP ARTHROPLASTY ANTERIOR APPROACH (Right Hip)     Patient location during evaluation: PACU Anesthesia Type: Spinal Level of consciousness: oriented and awake and alert Pain management: pain level controlled Vital Signs Assessment: post-procedure vital signs reviewed and stable Respiratory status: spontaneous breathing, respiratory function stable and patient connected to nasal cannula oxygen Cardiovascular status: blood pressure returned to baseline and stable Postop Assessment: no headache, no backache and no apparent nausea or vomiting Anesthetic complications: no    Last Vitals:  Vitals:   06/18/18 1130 06/18/18 1145  BP: (!) 106/59 (!) 113/56  Pulse: (!) 51 (!) 54  Resp: 18 17  Temp:    SpO2: 100% 100%    Last Pain:  Vitals:   06/18/18 1145  TempSrc:   PainSc: 5                  Shajuana Mclucas S

## 2018-06-18 NOTE — Anesthesia Preprocedure Evaluation (Signed)
Anesthesia Evaluation  Patient identified by MRN, date of birth, ID band Patient awake    Reviewed: Allergy & Precautions, NPO status , Patient's Chart, lab work & pertinent test results  Airway Mallampati: II  TM Distance: >3 FB Neck ROM: Full    Dental no notable dental hx.    Pulmonary neg pulmonary ROS, former smoker,    Pulmonary exam normal breath sounds clear to auscultation       Cardiovascular hypertension, Normal cardiovascular exam Rhythm:Regular Rate:Normal     Neuro/Psych CVA, Residual Symptoms negative psych ROS   GI/Hepatic Neg liver ROS, PUD,   Endo/Other  negative endocrine ROS  Renal/GU negative Renal ROS  negative genitourinary   Musculoskeletal negative musculoskeletal ROS (+)   Abdominal   Peds negative pediatric ROS (+)  Hematology negative hematology ROS (+)   Anesthesia Other Findings   Reproductive/Obstetrics negative OB ROS                             Anesthesia Physical Anesthesia Plan  ASA: III  Anesthesia Plan: Spinal   Post-op Pain Management:    Induction: Intravenous  PONV Risk Score and Plan: 0  Airway Management Planned: Simple Face Mask  Additional Equipment:   Intra-op Plan:   Post-operative Plan:   Informed Consent: I have reviewed the patients History and Physical, chart, labs and discussed the procedure including the risks, benefits and alternatives for the proposed anesthesia with the patient or authorized representative who has indicated his/her understanding and acceptance.   Dental advisory given  Plan Discussed with: CRNA and Surgeon  Anesthesia Plan Comments:         Anesthesia Quick Evaluation

## 2018-06-18 NOTE — Discharge Instructions (Signed)

## 2018-06-18 NOTE — Progress Notes (Signed)
Pt placed in holding status at this time.

## 2018-06-19 ENCOUNTER — Encounter (HOSPITAL_COMMUNITY): Payer: Self-pay | Admitting: Orthopedic Surgery

## 2018-06-19 DIAGNOSIS — E669 Obesity, unspecified: Secondary | ICD-10-CM | POA: Diagnosis present

## 2018-06-19 LAB — BASIC METABOLIC PANEL
ANION GAP: 7 (ref 5–15)
BUN: 28 mg/dL — ABNORMAL HIGH (ref 8–23)
CALCIUM: 8.1 mg/dL — AB (ref 8.9–10.3)
CO2: 24 mmol/L (ref 22–32)
Chloride: 103 mmol/L (ref 98–111)
Creatinine, Ser: 1 mg/dL (ref 0.61–1.24)
GFR calc non Af Amer: >60 mL/min (ref >60)
GLUCOSE: 120 mg/dL — AB (ref 70–99)
Potassium: 3.9 mmol/L (ref 3.5–5.1)
Sodium: 134 mmol/L — ABNORMAL LOW (ref 135–145)

## 2018-06-19 LAB — CBC
HCT: 37.9 % — ABNORMAL LOW (ref 39.0–52.0)
Hemoglobin: 12.5 g/dL — ABNORMAL LOW (ref 13.0–17.0)
MCH: 29.3 pg (ref 26.0–34.0)
MCHC: 33 g/dL (ref 30.0–36.0)
MCV: 89 fL (ref 80.0–100.0)
NRBC: 0 % (ref 0.0–0.2)
PLATELETS: 183 10*3/uL (ref 150–400)
RBC: 4.26 MIL/uL (ref 4.22–5.81)
RDW: 12.4 % (ref 11.5–15.5)
WBC: 19.8 10*3/uL — AB (ref 4.0–10.5)

## 2018-06-19 NOTE — Plan of Care (Signed)
Patient discharged home in stable condition. Rx given, IV dc'd

## 2018-06-19 NOTE — Progress Notes (Signed)
     Subjective: 1 Day Post-Op Procedure(s) (LRB): RIGHT TOTAL HIP ARTHROPLASTY ANTERIOR APPROACH (Right)   Patient reports pain as mild, pain controlled. No events throughout the night. Ready to be discharged home.    Objective:   VITALS:   Vitals:   06/19/18 0236 06/19/18 0527  BP:  129/66  Pulse:  91  Resp:  17  Temp: (!) 100.8 F (38.2 C) 100 F (37.8 C)  SpO2:  97%    Dorsiflexion/Plantar flexion intact Incision: dressing C/D/I No cellulitis present Compartment soft  LABS Recent Labs    06/19/18 0515  HGB 12.5*  HCT 37.9*  WBC 19.8*  PLT 183    Recent Labs    06/19/18 0515  NA 134*  K 3.9  BUN 28*  CREATININE 1.00  GLUCOSE 120*     Assessment/Plan: 1 Day Post-Op Procedure(s) (LRB): RIGHT TOTAL HIP ARTHROPLASTY ANTERIOR APPROACH (Right) Foley cath d/c'ed Advance diet Up with therapy D/C IV fluids Discharge home Follow up in 2 weeks at [facility] ([place]). Follow up with OLIN,Breeonna Mone D in 2 weeks.  Contact information:  [facility]) [address], [place] [phone]    Obese (BMI 30-39.9) Estimated body mass index is 34.45 kg/m as calculated from the following:   Height as of this encounter: 5\' 11"  (1.803 m).   Weight as of this encounter: 112 kg. Patient also counseled that weight may inhibit the healing process Patient counseled that losing weight will help with future health issues         West Pugh. Davine Sweney   PAC  06/19/2018, 7:41 AM

## 2018-06-19 NOTE — Progress Notes (Signed)
Physical Therapy Treatment Patient Details Name: [name] MRN: [id number] DOB: 1945/11/10 Today's Date: 06/19/2018    History of Present Illness 72 YO male s/p R DA-THA on 06/08/18. PMH includes OA, kidney stones, HTN, R cerebellar stroke 2010.     PT Comments    POD # 1 am session Assisted OOB with increased time.  Required assist to move RLE.  Assisted with amb in hallway then returned to room to perform some TE following HEP handout.  Instructed on proper tech, freq as well as use of ICE.   Follow Up Recommendations  Follow surgeon's recommendation for DC plan and follow-up therapies;Supervision for mobility/OOB(HEP)     Equipment Recommendations  Rolling walker with 5" wheels    Recommendations for Other Services       Precautions / Restrictions Precautions Precautions: Fall Restrictions Weight Bearing Restrictions: No RLE Weight Bearing: Weight bearing as tolerated    Mobility  Bed Mobility Overal bed mobility: Needs Assistance Bed Mobility: Supine to Sit           General bed mobility comments: Min assist for RLE management, verbal cuing on scooting to EOB.   Transfers Overall transfer level: Needs assistance Equipment used: Rolling walker (2 wheeled) Transfers: Sit to/from [employer] Sit to Stand: Min assist;From elevated surface;Min guard Stand pivot transfers: Min assist       General transfer comment: Min assist for power up and steadying. Verbal cuing for hand placement.   Ambulation/Gait Ambulation/Gait assistance: Min guard Gait Distance (Feet): 75 Feet Assistive device: Rolling walker (2 wheeled) Gait Pattern/deviations: Step-to pattern;Decreased stride length;Decreased weight shift to right;Antalgic;Decreased stance time - right Gait velocity: decr    General Gait Details: mild tremor and self recovered LOB during turn   Stairs             Wheelchair Mobility    Modified Rankin (Stroke Patients Only)       Balance                                            Cognition Arousal/Alertness: Awake/alert Behavior During Therapy: WFL for tasks assessed/performed Overall Cognitive Status: Within Functional Limits for tasks assessed                                        Exercises   Total Hip Replacement TE's 10 reps ankle pumps 10 reps knee presses 10 reps heel slides 10 reps SAQ's 10 reps ABD Followed by ICE     General Comments        Pertinent Vitals/Pain Pain Assessment: 0-10 Pain Score: 5  Pain Location: R hip  Pain Descriptors / Indicators: Aching;Tightness;Operative site guarding;Tender Pain Intervention(s): Monitored during session;Repositioned;Ice applied;Premedicated before session    Home Living                      Prior Function            PT Goals (current goals can now be found in the care plan section) Progress towards PT goals: Progressing toward goals    Frequency    7X/week      PT Plan Current plan remains appropriate    Co-evaluation              AM-PAC PT "6  Clicks" Daily Activity  Outcome Measure  Difficulty turning over in bed (including adjusting bedclothes, sheets and blankets)?: Unable Difficulty moving from lying on back to sitting on the side of the bed? : Unable Difficulty sitting down on and standing up from a chair with arms (e.g., wheelchair, bedside commode, etc,.)?: Unable Help needed moving to and from a bed to chair (including a wheelchair)?: A Little Help needed walking in hospital room?: A Little Help needed climbing 3-5 steps with a railing? : A Little 6 Click Score: 12    End of Session Equipment Utilized During Treatment: Gait belt Activity Tolerance: Patient tolerated treatment well Patient left: in bed;with bed alarm set;with call bell/phone within reach;with family/visitor present;with SCD's reapplied Nurse Communication: Mobility status PT Visit Diagnosis: Other  abnormalities of gait and mobility (R26.89);Difficulty in walking, not elsewhere classified (R26.2)     Time: 5916-3846 PT Time Calculation (min) (ACUTE ONLY): 25 min  Charges:  $Gait Training: 8-22 mins $Therapeutic Activity: 8-22 mins                     Rica Koyanagi  PTA Acute  Rehabilitation Services Pager      [phone] Office      [phone]

## 2018-06-19 NOTE — Progress Notes (Signed)
Physical Therapy Treatment Patient Details Name: [name] MRN: [id number] DOB: 06-07-1946 Today's Date: 06/19/2018    History of Present Illness 72 YO male s/p R DA-THA on 06/08/18. PMH includes OA, kidney stones, HTN, R cerebellar stroke 2010.     PT Comments    POD # 1 pm session Pt feeling better this afternoon.  Demonstrated and instructed on how to use a belt to self assist R LE.  Assisted with amb in hallway with Grandson and also performed/practiced one step pt has to enter home.  Performed some TE's following HEP handout.  Instructed on proper tech, freq as well as use of ICE.   Pt ready for D/C to home   Follow Up Recommendations  Follow surgeon's recommendation for DC plan and follow-up therapies;Supervision for mobility/OOB(HEP)     Equipment Recommendations  Rolling walker with 5" wheels    Recommendations for Other Services       Precautions / Restrictions Precautions Precautions: Fall Restrictions Weight Bearing Restrictions: No RLE Weight Bearing: Weight bearing as tolerated    Mobility  Bed Mobility Overal bed mobility: Needs Assistance Bed Mobility: Supine to Sit           General bed mobility comments: demonstarted and instructed on how to use a belt to assist R LE off/onto bed.  Transfers Overall transfer level: Needs assistance Equipment used: Rolling walker (2 wheeled) Transfers: Sit to/from [employer] Sit to Stand: Min assist;From elevated surface;Min guard Stand pivot transfers: Min assist       General transfer comment: Min assist for power up and steadying. Verbal cuing for hand placement.   Ambulation/Gait Ambulation/Gait assistance: Min guard Gait Distance (Feet): 75 Feet Assistive device: Rolling walker (2 wheeled) Gait Pattern/deviations: Step-to pattern;Decreased stride length;Decreased weight shift to right;Antalgic;Decreased stance time - right Gait velocity: decr    General Gait Details: mild tremor and  self recovered LOB during turn   Stairs Stairs: Yes Stairs assistance: Min guard;Min assist Stair Management: No rails;Step to pattern;Forwards;With walker Number of Stairs: 1 General stair comments: performed twice with Rozanna Boer Son present "hands on" instructed on proper walker placement, sequencing and safety.    Wheelchair Mobility    Modified Rankin (Stroke Patients Only)       Balance                                            Cognition Arousal/Alertness: Awake/alert Behavior During Therapy: WFL for tasks assessed/performed Overall Cognitive Status: Within Functional Limits for tasks assessed                                        Exercises      General Comments        Pertinent Vitals/Pain Pain Assessment: 0-10 Pain Score: 5  Pain Location: R hip  Pain Descriptors / Indicators: Aching;Tightness;Operative site guarding;Tender Pain Intervention(s): Monitored during session;Repositioned;Ice applied;Premedicated before session    Home Living                      Prior Function            PT Goals (current goals can now be found in the care plan section) Progress towards PT goals: Progressing toward goals    Frequency    7X/week  PT Plan Current plan remains appropriate    Co-evaluation              AM-PAC PT "6 Clicks" Daily Activity  Outcome Measure  Difficulty turning over in bed (including adjusting bedclothes, sheets and blankets)?: Unable Difficulty moving from lying on back to sitting on the side of the bed? : Unable Difficulty sitting down on and standing up from a chair with arms (e.g., wheelchair, bedside commode, etc,.)?: Unable Help needed moving to and from a bed to chair (including a wheelchair)?: A Little Help needed walking in hospital room?: A Little Help needed climbing 3-5 steps with a railing? : A Little 6 Click Score: 12    End of Session Equipment Utilized During  Treatment: Gait belt Activity Tolerance: Patient tolerated treatment well Patient left: in bed;with bed alarm set;with call bell/phone within reach;with family/visitor present;with SCD's reapplied Nurse Communication: Mobility status PT Visit Diagnosis: Other abnormalities of gait and mobility (R26.89);Difficulty in walking, not elsewhere classified (R26.2)     Time: 1400-1430 PT Time Calculation (min) (ACUTE ONLY): 30 min  Charges:  $Gait Training: 8-22 mins $Therapeutic Exercise: 8-22 mins                      Rica Koyanagi  PTA Acute  Rehabilitation Services Pager      [phone] Office      [phone]

## 2018-06-25 NOTE — Discharge Summary (Signed)
Physician Discharge Summary  Patient ID: [name] MRN: [id number] DOB/AGE: 10-11-1945 72 y.o.  Admit date: 06/18/2018 Discharge date: 06/19/2018   Procedures:  Procedure(s) (LRB): RIGHT TOTAL HIP ARTHROPLASTY ANTERIOR APPROACH (Right)  Attending Physician:  Dr. [name]   Admission Diagnoses:   Right hip primary OA / pain  Discharge Diagnoses:  Principal Problem:   S/P right THA, AA Active Problems:   Obese  Past Medical History:  Diagnosis Date  . Arthritis   . History of colon polyps   . History of kidney stones   . Hypercholesteremia   . Hypertension   . Peptic ulcer   . 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 male, has a history of pain and functional disability in the right hip(s) due to arthritis and patient has failed non-surgical conservative treatments for greater than 12 weeks to include NSAID's and/or analgesics, corticosteriod injections, use of assistive devices and activity modification.  Onset of symptoms was gradual starting ~1 years ago with gradually worsening course since that time.The patient noted no past surgery on the right hip(s).  Patient currently rates pain in the right hip at 6 out of 10 with activity. Patient has worsening of pain with activity and weight bearing, trendelenberg gait, pain that interfers with activities of daily living and pain with passive range of motion. Patient has evidence of periarticular osteophytes and joint space narrowing by imaging studies. This condition presents safety issues increasing the risk of falls. There is no current active infection.  Risks, benefits and expectations were discussed with the patient.  Risks including but not limited to the risk of anesthesia, blood clots, nerve damage, blood vessel damage, failure of the prosthesis, infection and up to and including death.  Patient understand the risks, benefits and  expectations and wishes to proceed with surgery.   PCP: Consuello Closs, MD   Discharged Condition: good  Hospital Course:  Patient underwent the above stated procedure on 06/18/2018. Patient tolerated the procedure well and brought to the recovery room in good condition and subsequently to the floor.  POD #1 BP: 129/66 ; Pulse: 91 ; Temp: 100 F (37.8 C) ; Resp: 17 Patient reports pain as mild, pain controlled. No events throughout the night. Ready to be discharged home. Dorsiflexion/plantar flexion intact, incision: dressing C/D/I, no cellulitis present and compartment soft.   LABS  Basename    HGB     12.5  HCT     37.9    Discharge Exam: General appearance: alert, cooperative and no distress Extremities: Homans sign is negative, no sign of DVT, no edema, redness or tenderness in the calves or thighs and no ulcers, gangrene or trophic changes  Disposition:  Home with follow up in 2 weeks   Follow-up Information    [name], MD. Schedule an appointment as soon as possible for a visit in 2 weeks.   Specialty:  Orthopedic Surgery Contact information: [address] [place] [address] [phone]           Discharge Instructions    Call MD / Call 911   Complete by:  As directed    If you experience chest pain or shortness of breath, CALL 911 and be transported to the hospital emergency room.  If you develope a fever above 101 F, pus (white drainage) or increased drainage or redness at  the wound, or calf pain, call your surgeon's office.   Change dressing   Complete by:  As directed    Maintain surgical dressing until follow up in the clinic. If the edges start to pull up, may reinforce with tape. If the dressing is no longer working, may remove and cover with gauze and tape, but must keep the area dry and clean.  Call with any questions or concerns.   Constipation Prevention   Complete by:  As directed    Drink plenty of fluids.  Prune juice may be  helpful.  You may use a stool softener, such as Colace (over the counter) 100 mg twice a day.  Use MiraLax (over the counter) for constipation as needed.   Diet - low sodium heart healthy   Complete by:  As directed    Discharge instructions   Complete by:  As directed    Maintain surgical dressing until follow up in the clinic. If the edges start to pull up, may reinforce with tape. If the dressing is no longer working, may remove and cover with gauze and tape, but must keep the area dry and clean.  Follow up in 2 weeks at [facility]. Call with any questions or concerns.   Increase activity slowly as tolerated   Complete by:  As directed    Weight bearing as tolerated with assist device (walker, cane, etc) as directed, use it as long as suggested by your surgeon or therapist, typically at least 4-6 weeks.   TED hose   Complete by:  As directed    Use stockings (TED hose) for 2 weeks on both leg(s).  You may remove them at night for sleeping.      Allergies as of 06/19/2018   No Known Allergies     Medication List    TAKE these medications   amLODipine 5 MG tablet Commonly known as:  NORVASC Take 5 mg by mouth daily.   aspirin 81 MG chewable tablet Chew 1 tablet (81 mg total) by mouth daily. Take for 4 weeks, then resume regular dose.   clopidogrel 75 MG tablet Commonly known as:  PLAVIX Take 75 mg by mouth daily.   docusate sodium 100 MG capsule Commonly known as:  COLACE Take 1 capsule (100 mg total) by mouth 2 (two) times daily.   ferrous sulfate 325 (65 FE) MG tablet Take 1 tablet (325 mg total) by mouth 3 (three) times daily with meals.   Fish Oil 1200 MG Caps Take 1,200 mg by mouth daily.   Flaxseed Oil 1200 MG Caps Take 1,200 mg by mouth daily.   folic acid 270 MCG tablet Commonly known as:  FOLVITE Take 800 mcg by mouth daily.   hydrochlorothiazide 12.5 MG tablet Commonly known as:  HYDRODIURIL Take 12.5 mg by mouth daily.     HYDROcodone-acetaminophen 7.5-325 MG tablet Commonly known as:  NORCO Take 1-2 tablets by mouth every 4 (four) hours as needed for moderate pain.   HYDROcodone-acetaminophen 7.5-325 MG tablet Commonly known as:  NORCO Take 1-2 tablets by mouth every 4 (four) hours as needed for moderate pain.   lisinopril 20 MG tablet Commonly known as:  PRINIVIL,ZESTRIL Take 20 mg by mouth daily.   methocarbamol 500 MG tablet Commonly known as:  ROBAXIN Take 1 tablet (500 mg total) by mouth every 6 (six) hours as needed for muscle spasms.   multivitamin with minerals Tabs tablet Take 1 tablet by mouth daily.   polyethylene glycol packet Commonly  known as:  MIRALAX / GLYCOLAX Take 17 g by mouth 2 (two) times daily.   pravastatin 40 MG tablet Commonly known as:  PRAVACHOL Take 40 mg by mouth daily.            Discharge Care Instructions  (From admission, onward)         Start     Ordered   06/19/18 0000  Change dressing    Comments:  Maintain surgical dressing until follow up in the clinic. If the edges start to pull up, may reinforce with tape. If the dressing is no longer working, may remove and cover with gauze and tape, but must keep the area dry and clean.  Call with any questions or concerns.   06/19/18 [id number]           Signed: West Pugh. Caine Barfield   PA-C  06/25/2018, 9:39 AM

## 2018-07-07 DIAGNOSIS — N390 Urinary tract infection, site not specified: Secondary | ICD-10-CM | POA: Diagnosis not present

## 2018-07-07 DIAGNOSIS — M545 Low back pain: Secondary | ICD-10-CM | POA: Diagnosis not present

## 2018-07-07 DIAGNOSIS — B9689 Other specified bacterial agents as the cause of diseases classified elsewhere: Secondary | ICD-10-CM | POA: Diagnosis not present

## 2018-07-07 DIAGNOSIS — N281 Cyst of kidney, acquired: Secondary | ICD-10-CM | POA: Diagnosis not present

## 2018-07-08 DIAGNOSIS — Z6833 Body mass index (BMI) 33.0-33.9, adult: Secondary | ICD-10-CM | POA: Diagnosis not present

## 2018-07-08 DIAGNOSIS — Z9889 Other specified postprocedural states: Secondary | ICD-10-CM | POA: Diagnosis not present

## 2018-07-08 DIAGNOSIS — M545 Low back pain: Secondary | ICD-10-CM | POA: Diagnosis not present

## 2018-08-07 DIAGNOSIS — Z96641 Presence of right artificial hip joint: Secondary | ICD-10-CM | POA: Diagnosis not present

## 2018-08-07 DIAGNOSIS — Z471 Aftercare following joint replacement surgery: Secondary | ICD-10-CM | POA: Diagnosis not present

## 2018-09-06 DIAGNOSIS — Z6834 Body mass index (BMI) 34.0-34.9, adult: Secondary | ICD-10-CM | POA: Diagnosis not present

## 2018-09-06 DIAGNOSIS — J069 Acute upper respiratory infection, unspecified: Secondary | ICD-10-CM | POA: Diagnosis not present

## 2019-05-23 DIAGNOSIS — Z23 Encounter for immunization: Secondary | ICD-10-CM | POA: Diagnosis not present

## 2019-06-09 DIAGNOSIS — H5203 Hypermetropia, bilateral: Secondary | ICD-10-CM | POA: Diagnosis not present

## 2019-06-09 DIAGNOSIS — H524 Presbyopia: Secondary | ICD-10-CM | POA: Diagnosis not present

## 2019-06-09 DIAGNOSIS — H52209 Unspecified astigmatism, unspecified eye: Secondary | ICD-10-CM | POA: Diagnosis not present

## 2019-08-05 DIAGNOSIS — Z20828 Contact with and (suspected) exposure to other viral communicable diseases: Secondary | ICD-10-CM | POA: Diagnosis not present

## 2019-08-05 DIAGNOSIS — J3489 Other specified disorders of nose and nasal sinuses: Secondary | ICD-10-CM | POA: Diagnosis not present

## 2019-08-05 DIAGNOSIS — R0981 Nasal congestion: Secondary | ICD-10-CM | POA: Diagnosis not present

## 2019-08-18 DIAGNOSIS — J3489 Other specified disorders of nose and nasal sinuses: Secondary | ICD-10-CM | POA: Diagnosis not present

## 2019-08-18 DIAGNOSIS — R05 Cough: Secondary | ICD-10-CM | POA: Diagnosis not present

## 2019-08-18 DIAGNOSIS — Z20828 Contact with and (suspected) exposure to other viral communicable diseases: Secondary | ICD-10-CM | POA: Diagnosis not present

## 2019-09-15 DIAGNOSIS — Z1331 Encounter for screening for depression: Secondary | ICD-10-CM | POA: Diagnosis not present

## 2019-09-15 DIAGNOSIS — Z79899 Other long term (current) drug therapy: Secondary | ICD-10-CM | POA: Diagnosis not present

## 2019-09-15 DIAGNOSIS — Z86010 Personal history of colonic polyps: Secondary | ICD-10-CM | POA: Diagnosis not present

## 2019-09-15 DIAGNOSIS — E7800 Pure hypercholesterolemia, unspecified: Secondary | ICD-10-CM | POA: Diagnosis not present

## 2019-09-15 DIAGNOSIS — Z8673 Personal history of transient ischemic attack (TIA), and cerebral infarction without residual deficits: Secondary | ICD-10-CM | POA: Diagnosis not present

## 2019-09-15 DIAGNOSIS — Z6834 Body mass index (BMI) 34.0-34.9, adult: Secondary | ICD-10-CM | POA: Diagnosis not present

## 2019-09-15 DIAGNOSIS — I10 Essential (primary) hypertension: Secondary | ICD-10-CM | POA: Diagnosis not present

## 2019-09-15 DIAGNOSIS — Z0000 Encounter for general adult medical examination without abnormal findings: Secondary | ICD-10-CM | POA: Diagnosis not present

## 2019-09-15 DIAGNOSIS — Z87442 Personal history of urinary calculi: Secondary | ICD-10-CM | POA: Diagnosis not present

## 2020-02-03 DIAGNOSIS — Z6834 Body mass index (BMI) 34.0-34.9, adult: Secondary | ICD-10-CM | POA: Diagnosis not present

## 2020-02-03 DIAGNOSIS — S46912A Strain of unspecified muscle, fascia and tendon at shoulder and upper arm level, left arm, initial encounter: Secondary | ICD-10-CM | POA: Diagnosis not present

## 2020-02-10 DIAGNOSIS — S46912A Strain of unspecified muscle, fascia and tendon at shoulder and upper arm level, left arm, initial encounter: Secondary | ICD-10-CM | POA: Diagnosis not present

## 2020-04-12 DIAGNOSIS — M25552 Pain in left hip: Secondary | ICD-10-CM | POA: Diagnosis not present

## 2020-04-12 DIAGNOSIS — Z6833 Body mass index (BMI) 33.0-33.9, adult: Secondary | ICD-10-CM | POA: Diagnosis not present

## 2020-05-31 DIAGNOSIS — Z23 Encounter for immunization: Secondary | ICD-10-CM | POA: Diagnosis not present

## 2020-11-06 DIAGNOSIS — H6991 Unspecified Eustachian tube disorder, right ear: Secondary | ICD-10-CM | POA: Diagnosis not present

## 2020-11-06 DIAGNOSIS — R0981 Nasal congestion: Secondary | ICD-10-CM | POA: Diagnosis not present

## 2020-11-06 DIAGNOSIS — R051 Acute cough: Secondary | ICD-10-CM | POA: Diagnosis not present

## 2020-11-06 DIAGNOSIS — H6992 Unspecified Eustachian tube disorder, left ear: Secondary | ICD-10-CM | POA: Diagnosis not present

## 2020-11-06 DIAGNOSIS — J209 Acute bronchitis, unspecified: Secondary | ICD-10-CM | POA: Diagnosis not present

## 2020-11-06 DIAGNOSIS — J029 Acute pharyngitis, unspecified: Secondary | ICD-10-CM | POA: Diagnosis not present

## 2020-11-06 DIAGNOSIS — R509 Fever, unspecified: Secondary | ICD-10-CM | POA: Diagnosis not present

## 2020-12-28 DIAGNOSIS — Z6833 Body mass index (BMI) 33.0-33.9, adult: Secondary | ICD-10-CM | POA: Diagnosis not present

## 2020-12-28 DIAGNOSIS — M67952 Unspecified disorder of synovium and tendon, left thigh: Secondary | ICD-10-CM | POA: Diagnosis not present

## 2020-12-28 DIAGNOSIS — Z0000 Encounter for general adult medical examination without abnormal findings: Secondary | ICD-10-CM | POA: Diagnosis not present

## 2021-01-13 DIAGNOSIS — M67952 Unspecified disorder of synovium and tendon, left thigh: Secondary | ICD-10-CM | POA: Diagnosis not present

## 2021-01-13 DIAGNOSIS — Z6832 Body mass index (BMI) 32.0-32.9, adult: Secondary | ICD-10-CM | POA: Diagnosis not present

## 2021-01-13 DIAGNOSIS — Z1331 Encounter for screening for depression: Secondary | ICD-10-CM | POA: Diagnosis not present

## 2021-02-14 DIAGNOSIS — M48061 Spinal stenosis, lumbar region without neurogenic claudication: Secondary | ICD-10-CM | POA: Diagnosis not present

## 2021-02-14 DIAGNOSIS — M25552 Pain in left hip: Secondary | ICD-10-CM | POA: Diagnosis not present

## 2021-03-09 DIAGNOSIS — M545 Low back pain, unspecified: Secondary | ICD-10-CM | POA: Diagnosis not present

## 2021-03-16 DIAGNOSIS — M48062 Spinal stenosis, lumbar region with neurogenic claudication: Secondary | ICD-10-CM | POA: Diagnosis not present

## 2021-03-16 DIAGNOSIS — M545 Low back pain, unspecified: Secondary | ICD-10-CM | POA: Diagnosis not present

## 2021-04-06 DIAGNOSIS — M5442 Lumbago with sciatica, left side: Secondary | ICD-10-CM | POA: Diagnosis not present

## 2021-04-06 DIAGNOSIS — R2689 Other abnormalities of gait and mobility: Secondary | ICD-10-CM | POA: Diagnosis not present

## 2021-04-06 DIAGNOSIS — M5441 Lumbago with sciatica, right side: Secondary | ICD-10-CM | POA: Diagnosis not present

## 2021-04-06 DIAGNOSIS — M545 Low back pain, unspecified: Secondary | ICD-10-CM | POA: Diagnosis not present

## 2021-04-08 DIAGNOSIS — M5441 Lumbago with sciatica, right side: Secondary | ICD-10-CM | POA: Diagnosis not present

## 2021-04-08 DIAGNOSIS — R2689 Other abnormalities of gait and mobility: Secondary | ICD-10-CM | POA: Diagnosis not present

## 2021-04-08 DIAGNOSIS — M5442 Lumbago with sciatica, left side: Secondary | ICD-10-CM | POA: Diagnosis not present

## 2021-04-08 DIAGNOSIS — M545 Low back pain, unspecified: Secondary | ICD-10-CM | POA: Diagnosis not present

## 2021-04-11 DIAGNOSIS — M5442 Lumbago with sciatica, left side: Secondary | ICD-10-CM | POA: Diagnosis not present

## 2021-04-11 DIAGNOSIS — R2689 Other abnormalities of gait and mobility: Secondary | ICD-10-CM | POA: Diagnosis not present

## 2021-04-11 DIAGNOSIS — M545 Low back pain, unspecified: Secondary | ICD-10-CM | POA: Diagnosis not present

## 2021-04-11 DIAGNOSIS — M5441 Lumbago with sciatica, right side: Secondary | ICD-10-CM | POA: Diagnosis not present

## 2021-04-12 DIAGNOSIS — M5416 Radiculopathy, lumbar region: Secondary | ICD-10-CM | POA: Diagnosis not present

## 2021-04-14 DIAGNOSIS — M5441 Lumbago with sciatica, right side: Secondary | ICD-10-CM | POA: Diagnosis not present

## 2021-04-14 DIAGNOSIS — M5442 Lumbago with sciatica, left side: Secondary | ICD-10-CM | POA: Diagnosis not present

## 2021-04-14 DIAGNOSIS — R2689 Other abnormalities of gait and mobility: Secondary | ICD-10-CM | POA: Diagnosis not present

## 2021-04-14 DIAGNOSIS — M545 Low back pain, unspecified: Secondary | ICD-10-CM | POA: Diagnosis not present

## 2021-04-20 DIAGNOSIS — M5441 Lumbago with sciatica, right side: Secondary | ICD-10-CM | POA: Diagnosis not present

## 2021-04-20 DIAGNOSIS — M5442 Lumbago with sciatica, left side: Secondary | ICD-10-CM | POA: Diagnosis not present

## 2021-04-20 DIAGNOSIS — M545 Low back pain, unspecified: Secondary | ICD-10-CM | POA: Diagnosis not present

## 2021-04-20 DIAGNOSIS — R2689 Other abnormalities of gait and mobility: Secondary | ICD-10-CM | POA: Diagnosis not present

## 2021-04-22 DIAGNOSIS — M5417 Radiculopathy, lumbosacral region: Secondary | ICD-10-CM | POA: Diagnosis not present

## 2021-04-22 DIAGNOSIS — L980 Pyogenic granuloma: Secondary | ICD-10-CM | POA: Diagnosis not present

## 2021-04-22 DIAGNOSIS — Z6831 Body mass index (BMI) 31.0-31.9, adult: Secondary | ICD-10-CM | POA: Diagnosis not present

## 2021-04-27 DIAGNOSIS — L980 Pyogenic granuloma: Secondary | ICD-10-CM | POA: Diagnosis not present

## 2021-05-04 DIAGNOSIS — M545 Low back pain, unspecified: Secondary | ICD-10-CM | POA: Diagnosis not present

## 2021-05-04 DIAGNOSIS — M5441 Lumbago with sciatica, right side: Secondary | ICD-10-CM | POA: Diagnosis not present

## 2021-05-04 DIAGNOSIS — M5442 Lumbago with sciatica, left side: Secondary | ICD-10-CM | POA: Diagnosis not present

## 2021-05-04 DIAGNOSIS — R2689 Other abnormalities of gait and mobility: Secondary | ICD-10-CM | POA: Diagnosis not present

## 2021-05-19 DIAGNOSIS — M5416 Radiculopathy, lumbar region: Secondary | ICD-10-CM | POA: Diagnosis not present

## 2021-06-13 DIAGNOSIS — Z23 Encounter for immunization: Secondary | ICD-10-CM | POA: Diagnosis not present

## 2021-06-23 DIAGNOSIS — M5416 Radiculopathy, lumbar region: Secondary | ICD-10-CM | POA: Diagnosis not present

## 2021-06-23 DIAGNOSIS — M48062 Spinal stenosis, lumbar region with neurogenic claudication: Secondary | ICD-10-CM | POA: Diagnosis not present

## 2021-06-27 DIAGNOSIS — Z23 Encounter for immunization: Secondary | ICD-10-CM | POA: Diagnosis not present

## 2021-07-06 DIAGNOSIS — S46912A Strain of unspecified muscle, fascia and tendon at shoulder and upper arm level, left arm, initial encounter: Secondary | ICD-10-CM | POA: Diagnosis not present

## 2021-07-13 DIAGNOSIS — M5416 Radiculopathy, lumbar region: Secondary | ICD-10-CM | POA: Diagnosis not present

## 2021-10-13 DIAGNOSIS — S46912A Strain of unspecified muscle, fascia and tendon at shoulder and upper arm level, left arm, initial encounter: Secondary | ICD-10-CM | POA: Diagnosis not present

## 2021-12-14 DIAGNOSIS — M79604 Pain in right leg: Secondary | ICD-10-CM | POA: Diagnosis not present

## 2021-12-14 DIAGNOSIS — M5451 Vertebrogenic low back pain: Secondary | ICD-10-CM | POA: Diagnosis not present

## 2021-12-14 DIAGNOSIS — M79662 Pain in left lower leg: Secondary | ICD-10-CM | POA: Diagnosis not present

## 2021-12-14 DIAGNOSIS — M79661 Pain in right lower leg: Secondary | ICD-10-CM | POA: Diagnosis not present

## 2021-12-14 DIAGNOSIS — M48062 Spinal stenosis, lumbar region with neurogenic claudication: Secondary | ICD-10-CM | POA: Diagnosis not present

## 2021-12-14 DIAGNOSIS — M79605 Pain in left leg: Secondary | ICD-10-CM | POA: Diagnosis not present

## 2021-12-16 ENCOUNTER — Other Ambulatory Visit (HOSPITAL_COMMUNITY): Payer: Self-pay | Admitting: Orthopedic Surgery

## 2021-12-16 ENCOUNTER — Ambulatory Visit (HOSPITAL_COMMUNITY)
Admission: RE | Admit: 2021-12-16 | Discharge: 2021-12-16 | Disposition: A | Discharge status: Home / Self Care | Payer: Medicare HMO | Source: Ambulatory Visit | Attending: Specialist | Admitting: Specialist

## 2021-12-16 DIAGNOSIS — M79604 Pain in right leg: Secondary | ICD-10-CM | POA: Insufficient documentation

## 2021-12-16 DIAGNOSIS — M79605 Pain in left leg: Secondary | ICD-10-CM

## 2021-12-20 DIAGNOSIS — Z6832 Body mass index (BMI) 32.0-32.9, adult: Secondary | ICD-10-CM | POA: Diagnosis not present

## 2021-12-20 DIAGNOSIS — M48061 Spinal stenosis, lumbar region without neurogenic claudication: Secondary | ICD-10-CM | POA: Diagnosis not present

## 2021-12-20 DIAGNOSIS — Z8673 Personal history of transient ischemic attack (TIA), and cerebral infarction without residual deficits: Secondary | ICD-10-CM | POA: Diagnosis not present

## 2021-12-20 DIAGNOSIS — M5430 Sciatica, unspecified side: Secondary | ICD-10-CM | POA: Diagnosis not present

## 2021-12-20 DIAGNOSIS — Z79899 Other long term (current) drug therapy: Secondary | ICD-10-CM | POA: Diagnosis not present

## 2021-12-20 DIAGNOSIS — E7800 Pure hypercholesterolemia, unspecified: Secondary | ICD-10-CM | POA: Diagnosis not present

## 2021-12-20 DIAGNOSIS — Z01818 Encounter for other preprocedural examination: Secondary | ICD-10-CM | POA: Diagnosis not present

## 2021-12-20 DIAGNOSIS — B029 Zoster without complications: Secondary | ICD-10-CM | POA: Diagnosis not present

## 2021-12-22 ENCOUNTER — Ambulatory Visit: Payer: Self-pay | Admitting: Orthopedic Surgery

## 2021-12-22 DIAGNOSIS — M48062 Spinal stenosis, lumbar region with neurogenic claudication: Secondary | ICD-10-CM

## 2022-01-13 ENCOUNTER — Ambulatory Visit: Payer: Self-pay | Admitting: Orthopedic Surgery

## 2022-01-13 NOTE — H&P (View-Only) (Signed)
[name] is an 76 y.o. male.   Chief Complaint: back and leg pain, weakness HPI: Patient follows up today for his back. When he last saw [name] in the fall, he was given a prescription for prednisone which she states did well up until about 2 weeks ago. No new injury but about 2 weeks ago he started feeling back and left leg pain again. Prior to being seen on our clinic he was seen by [name] and had an epidural that really did not help very much. With this recent increase in pain he took another round of prednisone as he had a refill left, it did not help with his symptoms at all. Also aside from his radicular pain in the left leg he is also noting swelling in the left ankle. Denies calf pain. States the ankle itself really is not painful just feels tight from swelling. Denies any past history with the ankle. He also mentions that he has noticed a new raised perhaps vesicular rash in the proximal medial aspect of the lower leg on the left. Of note he did have his shingles vaccine within the last few months. Reports pain is worse with extension and improves with supported flexion. He did have one episode recently where this left leg gave way on him trying to push up with it. Recheck back. He has tried PT/INJ/prednisone. Today he is complaining of left side/leg pain, left ankle is swollen.  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 Social History:  reports that he has quit  smoking. He has never used smokeless tobacco. He reports current alcohol use. No history on file for drug use.  Allergies:  Allergies  Allergen Reactions   Morphine Itching    Patient states it is tolerable itching   Current meds: amLODIPine 5 mg tablet clopidogreL 75 mg tablet folic acid 448 mcg tablet gabapentin 100 mg capsule hydroCHLOROthiazide 12.5 mg tablet lisinopriL 20 mg tablet methocarbamoL 500 mg tablet pravastatin 40 mg tablet predniSONE 5 mg tablets in a dose pack TylenoL  Review of Systems  Constitutional: Negative.   HENT: Negative.    Eyes: Negative.   Respiratory: Negative.    Cardiovascular: Negative.   Gastrointestinal: Negative.   Endocrine: Negative.   Genitourinary: Negative.   Musculoskeletal:  Positive for back pain and gait problem.  Skin: Negative.   Neurological:  Positive for weakness and numbness.   There were no vitals taken for this visit. Physical Exam Constitutional:      Appearance: Normal appearance.  HENT:     Head: Normocephalic and atraumatic.     Right Ear: External ear normal.     Left Ear: External ear normal.     Nose: Nose normal.     Mouth/Throat:     Pharynx: Oropharynx is clear.  Eyes:     Conjunctiva/sclera: Conjunctivae normal.  Cardiovascular:  Rate and Rhythm: Normal rate and regular rhythm.     Pulses: Normal pulses.     Heart sounds: Normal heart sounds.  Pulmonary:     Effort: Pulmonary effort is normal.     Breath sounds: Normal breath sounds.  Abdominal:     General: Bowel sounds are normal.  Musculoskeletal:     Cervical back: Normal range of motion.     Comments: Patient is awake, alert, and oriented 3. Well-nourished and well-developed. Pleasant and in no acute distress. Normal gait with no assistive devices. He is comfortably seated.  On examination of the lumbar spine, nontender to palpation through the spinous processes. Tender left lumbar paraspinous musculature and left buttock. Nontender  over the greater trochanter of the hips. Normal flexion and decreased extension lumbar spine. Seated straight leg raise on the left reproduces buttock and leg pain, negative on the right. No lower extremity weakness noted, 5/5 throughout the hip flexor, quads, hamstrings, plantar and dorsiflexion, and EHL. No groin pain with rotation of the hips bilaterally. Patellar and Achilles reflexes 2+. No clonus present, negative Babinski. Sensation intact distally. No calf pain or sign of DVT.  Swelling about the left ankle although he is nontender on exam, slightly decreased range of motion about the ankle likely secondary to swelling. Nontender nonerythematous raised and slightly vesicular in nature rash localized to the left medial proximal lower leg.  Neurological:     Mental Status: He is alert.    MRI of the lumbar spine multifactorial severe spinal stenosis at L4-5. A minimal grade 1 degenerative spondylolisthesis.A disc herniation is noted at L4-5 migrating cephalad. Disc degeneration is noted at multiple levels. Three-view x-rays demonstrate no instability in flexion extension of the grade 1 listhesis at L4-5. Multilevel disc degeneration. And increased lumbosacral angle.  Assessment/Plan Impression: Recurrent back and left lower extremity radicular pain due to spinal stenosis and disc herniation. Refractory to PT, epidural injection, prednisone  Plan: We again discussed relevant anatomy and etiology of his symptoms as well as treatment options. Given that he did not have significant relief with prior epidural injection would not recommend repeating that. As previously discussed we again discussed surgical options which would involve a decompression at L4-5 with a possible lateral mass fusion with allograft and autograft bone. We discussed the procedure itself as well as risks, complications and alternatives including but not limited to DVT, PE, failure of procedure, need for secondary procedure, dural  tear,, anesthesia risk. We discussed postop protocols, walking program, need for lumbar spine restrictions postop. He also has unrelated left ankle swelling today, we will obtain a stat Doppler left lower extremity to rule out DVT. No prior history or family history noted for DVT. We have asked him to discuss the rash on his left leg with his PCP and that he may need a dermatology evaluation for that but it is not related to the lumbar spine. He would like to proceed with surgery. All questions were invited and answered. We will obtain clearance from his PCP and then proceed accordingly with scheduling. In the interim he can continue gabapentin, he has 100 mg tablets of that and we discussed titrating up from his current 200 mg 3 times daily dose to 300 mg 3 times daily to see if that is more effective. Would not repeat prednisone at this point given that he did not get relief from his most recent Dosepak. Patient seen in conjunction with [name] today.  Plan microlumbar decompression L4-5, possible lateral mass  fusion with autograft and allograft bone  Cecilie Kicks, PA-C for Dr Tonita Cong 01/13/2022, 12:17 PM

## 2022-01-13 NOTE — H&P (Signed)
[name] is an 76 y.o. male.   Chief Complaint: back and leg pain, weakness HPI: Patient follows up today for his back. When he last saw [name] in the fall, he was given a prescription for prednisone which she states did well up until about 2 weeks ago. No new injury but about 2 weeks ago he started feeling back and left leg pain again. Prior to being seen on our clinic he was seen by [name] and had an epidural that really did not help very much. With this recent increase in pain he took another round of prednisone as he had a refill left, it did not help with his symptoms at all. Also aside from his radicular pain in the left leg he is also noting swelling in the left ankle. Denies calf pain. States the ankle itself really is not painful just feels tight from swelling. Denies any past history with the ankle. He also mentions that he has noticed a new raised perhaps vesicular rash in the proximal medial aspect of the lower leg on the left. Of note he did have his shingles vaccine within the last few months. Reports pain is worse with extension and improves with supported flexion. He did have one episode recently where this left leg gave way on him trying to push up with it. Recheck back. He has tried PT/INJ/prednisone. Today he is complaining of left side/leg pain, left ankle is swollen.  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 Social History:  reports that he has quit  smoking. He has never used smokeless tobacco. He reports current alcohol use. No history on file for drug use.  Allergies:  Allergies  Allergen Reactions   Morphine Itching    Patient states it is tolerable itching   Current meds: amLODIPine 5 mg tablet clopidogreL 75 mg tablet folic acid 809 mcg tablet gabapentin 100 mg capsule hydroCHLOROthiazide 12.5 mg tablet lisinopriL 20 mg tablet methocarbamoL 500 mg tablet pravastatin 40 mg tablet predniSONE 5 mg tablets in a dose pack TylenoL  Review of Systems  Constitutional: Negative.   HENT: Negative.    Eyes: Negative.   Respiratory: Negative.    Cardiovascular: Negative.   Gastrointestinal: Negative.   Endocrine: Negative.   Genitourinary: Negative.   Musculoskeletal:  Positive for back pain and gait problem.  Skin: Negative.   Neurological:  Positive for weakness and numbness.   There were no vitals taken for this visit. Physical Exam Constitutional:      Appearance: Normal appearance.  HENT:     Head: Normocephalic and atraumatic.     Right Ear: External ear normal.     Left Ear: External ear normal.     Nose: Nose normal.     Mouth/Throat:     Pharynx: Oropharynx is clear.  Eyes:     Conjunctiva/sclera: Conjunctivae normal.  Cardiovascular:  Rate and Rhythm: Normal rate and regular rhythm.     Pulses: Normal pulses.     Heart sounds: Normal heart sounds.  Pulmonary:     Effort: Pulmonary effort is normal.     Breath sounds: Normal breath sounds.  Abdominal:     General: Bowel sounds are normal.  Musculoskeletal:     Cervical back: Normal range of motion.     Comments: Patient is awake, alert, and oriented 3. Well-nourished and well-developed. Pleasant and in no acute distress. Normal gait with no assistive devices. He is comfortably seated.  On examination of the lumbar spine, nontender to palpation through the spinous processes. Tender left lumbar paraspinous musculature and left buttock. Nontender  over the greater trochanter of the hips. Normal flexion and decreased extension lumbar spine. Seated straight leg raise on the left reproduces buttock and leg pain, negative on the right. No lower extremity weakness noted, 5/5 throughout the hip flexor, quads, hamstrings, plantar and dorsiflexion, and EHL. No groin pain with rotation of the hips bilaterally. Patellar and Achilles reflexes 2+. No clonus present, negative Babinski. Sensation intact distally. No calf pain or sign of DVT.  Swelling about the left ankle although he is nontender on exam, slightly decreased range of motion about the ankle likely secondary to swelling. Nontender nonerythematous raised and slightly vesicular in nature rash localized to the left medial proximal lower leg.  Neurological:     Mental Status: He is alert.    MRI of the lumbar spine multifactorial severe spinal stenosis at L4-5. A minimal grade 1 degenerative spondylolisthesis.A disc herniation is noted at L4-5 migrating cephalad. Disc degeneration is noted at multiple levels. Three-view x-rays demonstrate no instability in flexion extension of the grade 1 listhesis at L4-5. Multilevel disc degeneration. And increased lumbosacral angle.  Assessment/Plan Impression: Recurrent back and left lower extremity radicular pain due to spinal stenosis and disc herniation. Refractory to PT, epidural injection, prednisone  Plan: We again discussed relevant anatomy and etiology of his symptoms as well as treatment options. Given that he did not have significant relief with prior epidural injection would not recommend repeating that. As previously discussed we again discussed surgical options which would involve a decompression at L4-5 with a possible lateral mass fusion with allograft and autograft bone. We discussed the procedure itself as well as risks, complications and alternatives including but not limited to DVT, PE, failure of procedure, need for secondary procedure, dural  tear,, anesthesia risk. We discussed postop protocols, walking program, need for lumbar spine restrictions postop. He also has unrelated left ankle swelling today, we will obtain a stat Doppler left lower extremity to rule out DVT. No prior history or family history noted for DVT. We have asked him to discuss the rash on his left leg with his PCP and that he may need a dermatology evaluation for that but it is not related to the lumbar spine. He would like to proceed with surgery. All questions were invited and answered. We will obtain clearance from his PCP and then proceed accordingly with scheduling. In the interim he can continue gabapentin, he has 100 mg tablets of that and we discussed titrating up from his current 200 mg 3 times daily dose to 300 mg 3 times daily to see if that is more effective. Would not repeat prednisone at this point given that he did not get relief from his most recent Dosepak. Patient seen in conjunction with [name] today.  Plan microlumbar decompression L4-5, possible lateral mass  fusion with autograft and allograft bone  Cecilie Kicks, PA-C for Dr Tonita Cong 01/13/2022, 12:17 PM

## 2022-01-18 DIAGNOSIS — Z1331 Encounter for screening for depression: Secondary | ICD-10-CM | POA: Diagnosis not present

## 2022-01-18 DIAGNOSIS — Z6832 Body mass index (BMI) 32.0-32.9, adult: Secondary | ICD-10-CM | POA: Diagnosis not present

## 2022-01-18 DIAGNOSIS — Z9181 History of falling: Secondary | ICD-10-CM | POA: Diagnosis not present

## 2022-01-18 DIAGNOSIS — J329 Chronic sinusitis, unspecified: Secondary | ICD-10-CM | POA: Diagnosis not present

## 2022-01-18 DIAGNOSIS — R238 Other skin changes: Secondary | ICD-10-CM | POA: Diagnosis not present

## 2022-01-18 DIAGNOSIS — J40 Bronchitis, not specified as acute or chronic: Secondary | ICD-10-CM | POA: Diagnosis not present

## 2022-01-18 DIAGNOSIS — Z0000 Encounter for general adult medical examination without abnormal findings: Secondary | ICD-10-CM | POA: Diagnosis not present

## 2022-01-18 NOTE — Pre-Procedure Instructions (Signed)
Surgical Instructions    Your procedure is scheduled on Thursday, June 8th.  Report to [facility] Main Entrance "A" at 05:30 A.M., then check in with the Admitting office.  Call this number if you have problems the morning of surgery:  [phone]   If you have any questions prior to your surgery date call [phone]: Open Monday-Friday 8am-4pm    Remember:  Do not eat after midnight the night before your surgery  You may drink clear liquids until 04:30 AM the morning of your surgery.   Clear liquids allowed are: Water, Non-Citrus Juices (without pulp), Carbonated Beverages, Clear Tea, Black Coffee Only (NO MILK, CREAM OR POWDERED CREAMER of any kind), and Gatorade.   Patient Instructions  The night before surgery:  No food after midnight. ONLY clear liquids after midnight  The day of surgery (if you do NOT have diabetes):  Drink ONE (1) Pre-Surgery Clear Ensure by 04:30 AM the morning of surgery. Drink in one sitting. Do not sip.  This drink was given to you during your hospital  pre-op appointment visit.  Nothing else to drink after completing the  Pre-Surgery Clear Ensure.          If you have questions, please contact your surgeon's office.      Take these medicines the morning of surgery with A SIP OF WATER  amLODipine (NORVASC) gabapentin (NEURONTIN) oxyCODONE-acetaminophen (PERCOCET/ROXICET)  pravastatin (PRAVACHOL)   Follow your surgeon's instructions on when to stop clopidogrel (PLAVIX) .  If no instructions were given by your surgeon then you will need to call the office to get those instructions.      As of today, STOP taking any Aspirin (unless otherwise instructed by your surgeon) Aleve, Naproxen, Ibuprofen, Motrin, Advil, Goody's, BC's, all herbal medications, fish oil, and all vitamins.                     Do NOT Smoke (Tobacco/Vaping) for 24 hours prior to your procedure.  If you use a CPAP at night, you may bring your mask/headgear for your  overnight stay.   Contacts, glasses, piercing's, hearing aid's, dentures or partials may not be worn into surgery, please bring cases for these belongings.    For patients admitted to the hospital, discharge time will be determined by your treatment team.   Patients discharged the day of surgery will not be allowed to drive home, and someone needs to stay with them for 24 hours.  SURGICAL WAITING ROOM VISITATION Patients having surgery or a procedure may have two support people in the waiting room. These visitors may be switched out with other visitors if needed. Children under the age of 20 must have an adult accompany them who is not the patient. If the patient needs to stay at the hospital during part of their recovery, the visitor guidelines for inpatient rooms apply.  Please refer to the [facility] website for the visitor guidelines for Inpatients (after your surgery is over and you are in a regular room).    Special instructions:   - Preparing For Surgery  Before surgery, you can play an important role. Because skin is not sterile, your skin needs to be as free of germs as possible. You can reduce the number of germs on your skin by washing with CHG (chlorahexidine gluconate) Soap before surgery.  CHG is an antiseptic cleaner which kills germs and bonds with the skin to continue killing germs even after washing.    Oral Hygiene is  also important to reduce your risk of infection.  Remember - BRUSH YOUR TEETH THE MORNING OF SURGERY WITH YOUR REGULAR TOOTHPASTE  Please do not use if you have an allergy to CHG or antibacterial soaps. If your skin becomes reddened/irritated stop using the CHG.  Do not shave (including legs and underarms) for at least 48 hours prior to first CHG shower. It is OK to shave your face.  Please follow these instructions carefully.   Shower the NIGHT BEFORE SURGERY and the MORNING OF SURGERY  If you chose to wash your hair, wash your hair first as  usual with your normal shampoo.  After you shampoo, rinse your hair and body thoroughly to remove the shampoo.  Use CHG Soap as you would any other liquid soap. You can apply CHG directly to the skin and wash gently with a scrungie or a clean washcloth.   Apply the CHG Soap to your body ONLY FROM THE NECK DOWN.  Do not use on open wounds or open sores. Avoid contact with your eyes, ears, mouth and genitals (private parts). Wash Face and genitals (private parts)  with your normal soap.   Wash thoroughly, paying special attention to the area where your surgery will be performed.  Thoroughly rinse your body with warm water from the neck down.  DO NOT shower/wash with your normal soap after using and rinsing off the CHG Soap.  Pat yourself dry with a CLEAN TOWEL.  Wear CLEAN PAJAMAS to bed the night before surgery  Place CLEAN SHEETS on your bed the night before your surgery  DO NOT SLEEP WITH PETS.   Day of Surgery: Take a shower with CHG soap. Do not wear jewelry  Do not wear lotions, powders, colognes, or deodorant.  Men may shave face and neck. Do not bring valuables to the hospital.  [facility] is not responsible for any belongings or valuables.  Wear Clean/Comfortable clothing the morning of surgery Remember to brush your teeth WITH YOUR REGULAR TOOTHPASTE.   Please read over the following fact sheets that you were given.    If you received a COVID test during your pre-op visit  it is requested that you wear a mask when out in public, stay away from anyone that may not be feeling well and notify your surgeon if you develop symptoms. If you have been in contact with anyone that has tested positive in the last 10 days please notify you surgeon.

## 2022-01-19 ENCOUNTER — Encounter (HOSPITAL_COMMUNITY): Payer: Self-pay

## 2022-01-19 ENCOUNTER — Other Ambulatory Visit: Payer: Self-pay

## 2022-01-19 ENCOUNTER — Encounter (HOSPITAL_COMMUNITY)
Admission: RE | Admit: 2022-01-19 | Discharge: 2022-01-19 | Disposition: A | Discharge status: Home / Self Care | Payer: Medicare HMO | Source: Ambulatory Visit | Attending: Specialist | Admitting: Specialist

## 2022-01-19 VITALS — BP 165/70 | HR 80 | Temp 97.7°F | Resp 17 | Ht 71.0 in | Wt 237.0 lb

## 2022-01-19 DIAGNOSIS — Z01818 Encounter for other preprocedural examination: Secondary | ICD-10-CM | POA: Insufficient documentation

## 2022-01-19 LAB — SURGICAL PCR SCREEN
MRSA, PCR: NEGATIVE
Staphylococcus aureus: NEGATIVE

## 2022-01-19 LAB — CBC
HCT: 41.8 % (ref 39.0–52.0)
Hemoglobin: 14.5 g/dL (ref 13.0–17.0)
MCH: 30.3 pg (ref 26.0–34.0)
MCHC: 34.7 g/dL (ref 30.0–36.0)
MCV: 87.4 fL (ref 80.0–100.0)
Platelets: 290 10*3/uL (ref 150–400)
RBC: 4.78 MIL/uL (ref 4.22–5.81)
RDW: 12.9 % (ref 11.5–15.5)
WBC: 12.9 10*3/uL — ABNORMAL HIGH (ref 4.0–10.5)
nRBC: 0 % (ref 0.0–0.2)

## 2022-01-19 LAB — TYPE AND SCREEN
ABO/RH(D): O POS
Antibody Screen: NEGATIVE

## 2022-01-19 LAB — BASIC METABOLIC PANEL
Anion gap: 9 (ref 5–15)
BUN: 26 mg/dL — ABNORMAL HIGH (ref 8–23)
CO2: 25 mmol/L (ref 22–32)
Calcium: 9.1 mg/dL (ref 8.9–10.3)
Chloride: 104 mmol/L (ref 98–111)
Creatinine, Ser: 1.04 mg/dL (ref 0.61–1.24)
GFR, Estimated: >60 mL/min (ref >60)
Glucose, Bld: 104 mg/dL — ABNORMAL HIGH (ref 70–99)
Potassium: 4.1 mmol/L (ref 3.5–5.1)
Sodium: 138 mmol/L (ref 135–145)

## 2022-01-19 NOTE — Progress Notes (Signed)
PCP - Consuello Closs MD Cardiologist - denies  PPM/ICD - denies Device Orders -  Rep Notified -   Chest x-ray - na EKG - 01/19/22 Stress Test -  ECHO - denies Cardiac Cath - denies  Sleep Study - pt states he had a sleep study years ago but was not diagnoses with sleep apnea CPAP - no  Fasting Blood Sugar - na Checks Blood Sugar _____ times a day  Blood Thinner Instructions:pt states he will call Dr. Reather Littler office for instructions regarding Plavix Aspirin Instructions:na  ERAS Protcol - clear liquids until 0430 PRE-SURGERY Ensure or G2- Ensure  COVID TEST- na   Anesthesia review: no  Patient denies shortness of breath, fever, cough and chest pain at PAT appointment   All instructions explained to the patient, with a verbal understanding of the material. Patient agrees to go over the instructions while at home for a better understanding. Patient also instructed to wear a mask when out in public prior to surgery. The opportunity to ask questions was provided.

## 2022-01-23 DIAGNOSIS — B354 Tinea corporis: Secondary | ICD-10-CM | POA: Diagnosis not present

## 2022-01-23 DIAGNOSIS — D485 Neoplasm of uncertain behavior of skin: Secondary | ICD-10-CM | POA: Diagnosis not present

## 2022-01-23 DIAGNOSIS — L300 Nummular dermatitis: Secondary | ICD-10-CM | POA: Diagnosis not present

## 2022-01-23 DIAGNOSIS — L0390 Cellulitis, unspecified: Secondary | ICD-10-CM | POA: Diagnosis not present

## 2022-01-23 DIAGNOSIS — C44729 Squamous cell carcinoma of skin of left lower limb, including hip: Secondary | ICD-10-CM | POA: Diagnosis not present

## 2022-01-23 DIAGNOSIS — C4372 Malignant melanoma of left lower limb, including hip: Secondary | ICD-10-CM | POA: Diagnosis not present

## 2022-01-25 NOTE — Anesthesia Preprocedure Evaluation (Addendum)
Anesthesia Evaluation  Patient identified by MRN, date of birth, ID band Patient awake    Reviewed: Allergy & Precautions, NPO status , Patient's Chart, lab work & pertinent test results  History of Anesthesia Complications Negative for: history of anesthetic complications  Airway Mallampati: II  TM Distance: >3 FB Neck ROM: Limited    Dental  (+) Partial Lower, Missing, Dental Advisory Given, Implants, Loose,    Pulmonary former smoker,    breath sounds clear to auscultation       Cardiovascular hypertension, Pt. on medications (-) angina Rhythm:Regular Rate:Normal     Neuro/Psych CVA (ataxia: walks with cane), Residual Symptoms negative psych ROS   GI/Hepatic Neg liver ROS, PUD,   Endo/Other  Morbid obesity  Renal/GU negative Renal ROS     Musculoskeletal  (+) Arthritis ,   Abdominal   Peds  Hematology negative hematology ROS (+)   Anesthesia Other Findings   Reproductive/Obstetrics                           Anesthesia Physical Anesthesia Plan  ASA: 3  Anesthesia Plan: General   Post-op Pain Management: Tylenol PO (pre-op)*   Induction: Intravenous  PONV Risk Score and Plan: 2 and Dexamethasone and Ondansetron  Airway Management Planned: Oral ETT  Additional Equipment: None  Intra-op Plan:   Post-operative Plan: Extubation in OR  Informed Consent: I have reviewed the patients History and Physical, chart, labs and discussed the procedure including the risks, benefits and alternatives for the proposed anesthesia with the patient or authorized representative who has indicated his/her understanding and acceptance.     Dental advisory given  Plan Discussed with: CRNA and Surgeon  Anesthesia Plan Comments:        Anesthesia Quick Evaluation

## 2022-01-26 ENCOUNTER — Ambulatory Visit (HOSPITAL_COMMUNITY): Payer: Medicare HMO

## 2022-01-26 ENCOUNTER — Encounter (HOSPITAL_COMMUNITY)
Admission: RE | Disposition: A | Discharge status: Home / Self Care | Payer: Self-pay | Source: Home / Self Care | Attending: Specialist

## 2022-01-26 ENCOUNTER — Ambulatory Visit (HOSPITAL_COMMUNITY): Payer: Medicare HMO | Admitting: Anesthesiology

## 2022-01-26 ENCOUNTER — Ambulatory Visit (HOSPITAL_COMMUNITY)
Admission: RE | Admit: 2022-01-26 | Discharge: 2022-01-27 | Disposition: A | Discharge status: Home / Self Care | Payer: Medicare HMO | Attending: Specialist | Admitting: Specialist

## 2022-01-26 ENCOUNTER — Encounter (HOSPITAL_COMMUNITY): Payer: Self-pay | Admitting: Specialist

## 2022-01-26 ENCOUNTER — Other Ambulatory Visit: Payer: Self-pay

## 2022-01-26 ENCOUNTER — Ambulatory Visit (HOSPITAL_BASED_OUTPATIENT_CLINIC_OR_DEPARTMENT_OTHER): Payer: Medicare HMO | Admitting: Anesthesiology

## 2022-01-26 DIAGNOSIS — Z87891 Personal history of nicotine dependence: Secondary | ICD-10-CM

## 2022-01-26 DIAGNOSIS — R269 Unspecified abnormalities of gait and mobility: Secondary | ICD-10-CM | POA: Insufficient documentation

## 2022-01-26 DIAGNOSIS — M48062 Spinal stenosis, lumbar region with neurogenic claudication: Secondary | ICD-10-CM

## 2022-01-26 DIAGNOSIS — K279 Peptic ulcer, site unspecified, unspecified as acute or chronic, without hemorrhage or perforation: Secondary | ICD-10-CM | POA: Diagnosis not present

## 2022-01-26 DIAGNOSIS — Z6832 Body mass index (BMI) 32.0-32.9, adult: Secondary | ICD-10-CM | POA: Diagnosis not present

## 2022-01-26 DIAGNOSIS — Z9889 Other specified postprocedural states: Secondary | ICD-10-CM | POA: Diagnosis not present

## 2022-01-26 DIAGNOSIS — Z01818 Encounter for other preprocedural examination: Secondary | ICD-10-CM | POA: Diagnosis not present

## 2022-01-26 DIAGNOSIS — M7138 Other bursal cyst, other site: Secondary | ICD-10-CM | POA: Diagnosis not present

## 2022-01-26 DIAGNOSIS — M4316 Spondylolisthesis, lumbar region: Secondary | ICD-10-CM

## 2022-01-26 DIAGNOSIS — M47816 Spondylosis without myelopathy or radiculopathy, lumbar region: Secondary | ICD-10-CM | POA: Insufficient documentation

## 2022-01-26 DIAGNOSIS — R531 Weakness: Secondary | ICD-10-CM | POA: Insufficient documentation

## 2022-01-26 DIAGNOSIS — M549 Dorsalgia, unspecified: Secondary | ICD-10-CM | POA: Insufficient documentation

## 2022-01-26 DIAGNOSIS — M1990 Unspecified osteoarthritis, unspecified site: Secondary | ICD-10-CM | POA: Diagnosis not present

## 2022-01-26 DIAGNOSIS — I69393 Ataxia following cerebral infarction: Secondary | ICD-10-CM | POA: Insufficient documentation

## 2022-01-26 DIAGNOSIS — M4317 Spondylolisthesis, lumbosacral region: Secondary | ICD-10-CM | POA: Insufficient documentation

## 2022-01-26 DIAGNOSIS — I10 Essential (primary) hypertension: Secondary | ICD-10-CM

## 2022-01-26 DIAGNOSIS — M5136 Other intervertebral disc degeneration, lumbar region: Secondary | ICD-10-CM | POA: Insufficient documentation

## 2022-01-26 DIAGNOSIS — M48061 Spinal stenosis, lumbar region without neurogenic claudication: Secondary | ICD-10-CM

## 2022-01-26 HISTORY — PX: LUMBAR LAMINECTOMY/DECOMPRESSION MICRODISCECTOMY: SHX5026

## 2022-01-26 SURGERY — LUMBAR LAMINECTOMY/DECOMPRESSION MICRODISCECTOMY 1 LEVEL
Anesthesia: General

## 2022-01-26 MED ORDER — BUPIVACAINE-EPINEPHRINE 0.5% -1:200000 IJ SOLN
INTRAMUSCULAR | Status: AC
Start: 2022-01-26 — End: ?
  Filled 2022-01-26: qty 1

## 2022-01-26 MED ORDER — OXYCODONE HCL 5 MG PO TABS
5.0000 mg | ORAL_TABLET | Freq: Once | ORAL | Status: AC | PRN
  Administered 2022-01-26: 5 mg via ORAL

## 2022-01-26 MED ORDER — GABAPENTIN 300 MG PO CAPS
300.0000 mg | ORAL_CAPSULE | Freq: Three times a day (TID) | ORAL | Status: DC
  Administered 2022-01-26 (×2): 300 mg via ORAL
  Filled 2022-01-26 (×2): qty 1

## 2022-01-26 MED ORDER — ALUM & MAG HYDROXIDE-SIMETH 200-200-20 MG/5ML PO SUSP
30.0000 mL | Freq: Four times a day (QID) | ORAL | Status: DC | PRN
  Administered 2022-01-26: 30 mL via ORAL
  Filled 2022-01-26: qty 30

## 2022-01-26 MED ORDER — FOLIC ACID 1 MG PO TABS
0.5000 mg | ORAL_TABLET | Freq: Every day | ORAL | Status: DC
  Administered 2022-01-27: 0.5 mg via ORAL
  Filled 2022-01-26: qty 1

## 2022-01-26 MED ORDER — DOCUSATE SODIUM 100 MG PO CAPS: 100.0000 mg | ORAL_CAPSULE | Freq: Two times a day (BID) | ORAL | 1 refills | Status: DC | PRN

## 2022-01-26 MED ORDER — TRANEXAMIC ACID-NACL 1000-0.7 MG/100ML-% IV SOLN
1000.0000 mg | INTRAVENOUS | Status: AC
  Administered 2022-01-26: 1000 mg via INTRAVENOUS
  Filled 2022-01-26: qty 100

## 2022-01-26 MED ORDER — 0.9 % SODIUM CHLORIDE (POUR BTL) OPTIME
TOPICAL | Status: DC | PRN
  Administered 2022-01-26 (×2): 1000 mL

## 2022-01-26 MED ORDER — PROPOFOL 10 MG/ML IV BOLUS
INTRAVENOUS | Status: DC | PRN
  Administered 2022-01-26: 160 mg via INTRAVENOUS

## 2022-01-26 MED ORDER — THROMBIN 20000 UNITS EX SOLR
CUTANEOUS | Status: DC | PRN
  Administered 2022-01-26: 20 mL via TOPICAL

## 2022-01-26 MED ORDER — CEFAZOLIN SODIUM-DEXTROSE 2-4 GM/100ML-% IV SOLN
2.0000 g | Freq: Three times a day (TID) | INTRAVENOUS | Status: DC
  Administered 2022-01-26: 2 g via INTRAVENOUS
  Filled 2022-01-26 (×2): qty 100

## 2022-01-26 MED ORDER — OXYCODONE HCL 5 MG PO TABS: 5.0000 mg | ORAL_TABLET | ORAL | 0 refills | Status: DC | PRN

## 2022-01-26 MED ORDER — MEPERIDINE HCL 25 MG/ML IJ SOLN: 6.2500 mg | INTRAMUSCULAR | Status: DC | PRN

## 2022-01-26 MED ORDER — ACETAMINOPHEN 10 MG/ML IV SOLN
1000.0000 mg | INTRAVENOUS | Status: AC
  Administered 2022-01-26: 1000 mg via INTRAVENOUS
  Filled 2022-01-26: qty 100

## 2022-01-26 MED ORDER — BUPIVACAINE-EPINEPHRINE 0.5% -1:200000 IJ SOLN
INTRAMUSCULAR | Status: DC | PRN
  Administered 2022-01-26: 9 mL

## 2022-01-26 MED ORDER — FAMOTIDINE 20 MG PO TABS
20.0000 mg | ORAL_TABLET | Freq: Every evening | ORAL | Status: DC | PRN
Start: 2022-01-26 — End: 2022-01-27

## 2022-01-26 MED ORDER — ORAL CARE MOUTH RINSE: 15.0000 mL | Freq: Once | OROMUCOSAL | Status: AC

## 2022-01-26 MED ORDER — PSYLLIUM 95 % PO PACK: 1.0000 | PACK | Freq: Every day | ORAL | Status: DC | PRN

## 2022-01-26 MED ORDER — ONDANSETRON HCL 4 MG/2ML IJ SOLN
INTRAMUSCULAR | Status: AC
  Filled 2022-01-26: qty 2

## 2022-01-26 MED ORDER — FENTANYL CITRATE (PF) 250 MCG/5ML IJ SOLN
INTRAMUSCULAR | Status: AC
  Filled 2022-01-26: qty 5

## 2022-01-26 MED ORDER — PHENYLEPHRINE HCL-NACL 20-0.9 MG/250ML-% IV SOLN
INTRAVENOUS | Status: DC | PRN
  Administered 2022-01-26: 25 ug/min via INTRAVENOUS

## 2022-01-26 MED ORDER — ACETAMINOPHEN 325 MG PO TABS: 650.0000 mg | ORAL_TABLET | ORAL | Status: DC | PRN

## 2022-01-26 MED ORDER — MIDAZOLAM HCL 2 MG/2ML IJ SOLN: 0.5000 mg | Freq: Once | INTRAMUSCULAR | Status: DC | PRN

## 2022-01-26 MED ORDER — BISACODYL 5 MG PO TBEC: 5.0000 mg | DELAYED_RELEASE_TABLET | Freq: Every day | ORAL | Status: DC | PRN

## 2022-01-26 MED ORDER — ACETAMINOPHEN 650 MG RE SUPP: 650.0000 mg | RECTAL | Status: DC | PRN

## 2022-01-26 MED ORDER — PHENYLEPHRINE 80 MCG/ML (10ML) SYRINGE FOR IV PUSH (FOR BLOOD PRESSURE SUPPORT)
PREFILLED_SYRINGE | INTRAVENOUS | Status: DC | PRN
  Administered 2022-01-26 (×2): 80 ug via INTRAVENOUS

## 2022-01-26 MED ORDER — FENTANYL CITRATE (PF) 100 MCG/2ML IJ SOLN
INTRAMUSCULAR | Status: AC
  Filled 2022-01-26: qty 2

## 2022-01-26 MED ORDER — HYDROCHLOROTHIAZIDE 12.5 MG PO TABS
12.5000 mg | ORAL_TABLET | Freq: Every day | ORAL | Status: DC
  Administered 2022-01-27: 12.5 mg via ORAL
  Filled 2022-01-26: qty 1

## 2022-01-26 MED ORDER — OXYCODONE HCL 5 MG PO TABS: 5.0000 mg | ORAL_TABLET | ORAL | Status: DC | PRN

## 2022-01-26 MED ORDER — LACTATED RINGERS IV SOLN: INTRAVENOUS | Status: DC

## 2022-01-26 MED ORDER — ONDANSETRON HCL 4 MG/2ML IJ SOLN: 4.0000 mg | Freq: Four times a day (QID) | INTRAMUSCULAR | Status: DC | PRN

## 2022-01-26 MED ORDER — METHOCARBAMOL 500 MG PO TABS
500.0000 mg | ORAL_TABLET | Freq: Four times a day (QID) | ORAL | Status: DC | PRN
  Administered 2022-01-26 – 2022-01-27 (×2): 500 mg via ORAL
  Filled 2022-01-26 (×2): qty 1

## 2022-01-26 MED ORDER — ROCURONIUM BROMIDE 10 MG/ML (PF) SYRINGE
PREFILLED_SYRINGE | INTRAVENOUS | Status: AC
  Filled 2022-01-26: qty 10

## 2022-01-26 MED ORDER — FENTANYL CITRATE (PF) 250 MCG/5ML IJ SOLN
INTRAMUSCULAR | Status: DC | PRN
  Administered 2022-01-26 (×5): 50 ug via INTRAVENOUS

## 2022-01-26 MED ORDER — TRAZODONE HCL 50 MG PO TABS
50.0000 mg | ORAL_TABLET | Freq: Every evening | ORAL | Status: DC | PRN
  Administered 2022-01-26: 50 mg via ORAL
  Filled 2022-01-26: qty 1

## 2022-01-26 MED ORDER — PROPOFOL 10 MG/ML IV BOLUS
INTRAVENOUS | Status: AC
  Filled 2022-01-26: qty 20

## 2022-01-26 MED ORDER — ROCURONIUM BROMIDE 10 MG/ML (PF) SYRINGE
PREFILLED_SYRINGE | INTRAVENOUS | Status: DC | PRN
  Administered 2022-01-26: 60 mg via INTRAVENOUS
  Administered 2022-01-26 (×2): 20 mg via INTRAVENOUS

## 2022-01-26 MED ORDER — KCL IN DEXTROSE-NACL 20-5-0.45 MEQ/L-%-% IV SOLN: INTRAVENOUS | Status: DC

## 2022-01-26 MED ORDER — MENTHOL 3 MG MT LOZG: 1.0000 | LOZENGE | OROMUCOSAL | Status: DC | PRN

## 2022-01-26 MED ORDER — ACETAMINOPHEN 500 MG PO TABS: 1000.0000 mg | ORAL_TABLET | Freq: Once | ORAL | Status: DC

## 2022-01-26 MED ORDER — THROMBIN 20000 UNITS EX SOLR
CUTANEOUS | Status: AC
Start: 2022-01-26 — End: ?
  Filled 2022-01-26: qty 20000

## 2022-01-26 MED ORDER — DEXAMETHASONE SODIUM PHOSPHATE 10 MG/ML IJ SOLN
INTRAMUSCULAR | Status: DC | PRN
  Administered 2022-01-26: 10 mg via INTRAVENOUS

## 2022-01-26 MED ORDER — POLYETHYLENE GLYCOL 3350 17 G PO PACK
17.0000 g | PACK | Freq: Every day | ORAL | Status: DC
  Administered 2022-01-26: 17 g via ORAL
  Filled 2022-01-26: qty 1

## 2022-01-26 MED ORDER — AMLODIPINE BESYLATE 5 MG PO TABS
5.0000 mg | ORAL_TABLET | Freq: Every day | ORAL | Status: DC
Start: 2022-01-27 — End: 2022-01-27
  Administered 2022-01-27: 5 mg via ORAL
  Filled 2022-01-26: qty 1

## 2022-01-26 MED ORDER — LIDOCAINE 2% (20 MG/ML) 5 ML SYRINGE
INTRAMUSCULAR | Status: AC
  Filled 2022-01-26: qty 5

## 2022-01-26 MED ORDER — OXYCODONE HCL 5 MG/5ML PO SOLN: 5.0000 mg | Freq: Once | ORAL | Status: AC | PRN

## 2022-01-26 MED ORDER — FENTANYL CITRATE (PF) 100 MCG/2ML IJ SOLN
25.0000 ug | INTRAMUSCULAR | Status: DC | PRN
  Administered 2022-01-26 (×3): 50 ug via INTRAVENOUS

## 2022-01-26 MED ORDER — PHENYLEPHRINE HCL (PRESSORS) 10 MG/ML IV SOLN
INTRAVENOUS | Status: DC | PRN
  Administered 2022-01-26: 80 ug via INTRAVENOUS

## 2022-01-26 MED ORDER — ONDANSETRON HCL 4 MG/2ML IJ SOLN
INTRAMUSCULAR | Status: DC | PRN
  Administered 2022-01-26: 4 mg via INTRAVENOUS

## 2022-01-26 MED ORDER — OXYCODONE HCL 5 MG PO TABS
ORAL_TABLET | ORAL | Status: AC
  Filled 2022-01-26: qty 3

## 2022-01-26 MED ORDER — DOCUSATE SODIUM 100 MG PO CAPS
100.0000 mg | ORAL_CAPSULE | Freq: Two times a day (BID) | ORAL | Status: DC
  Administered 2022-01-26 (×2): 100 mg via ORAL
  Filled 2022-01-26 (×2): qty 1

## 2022-01-26 MED ORDER — CHLORHEXIDINE GLUCONATE 0.12 % MT SOLN
15.0000 mL | Freq: Once | OROMUCOSAL | Status: AC
  Administered 2022-01-26: 15 mL via OROMUCOSAL
  Filled 2022-01-26: qty 15

## 2022-01-26 MED ORDER — DEXAMETHASONE SODIUM PHOSPHATE 10 MG/ML IJ SOLN
INTRAMUSCULAR | Status: AC
  Filled 2022-01-26: qty 1

## 2022-01-26 MED ORDER — LIDOCAINE 2% (20 MG/ML) 5 ML SYRINGE
INTRAMUSCULAR | Status: DC | PRN
  Administered 2022-01-26: 100 mg via INTRAVENOUS

## 2022-01-26 MED ORDER — PHENOL 1.4 % MT LIQD: 1.0000 | OROMUCOSAL | Status: DC | PRN

## 2022-01-26 MED ORDER — METHOCARBAMOL 1000 MG/10ML IJ SOLN: 500.0000 mg | Freq: Four times a day (QID) | INTRAVENOUS | Status: DC | PRN

## 2022-01-26 MED ORDER — POLYETHYLENE GLYCOL 3350 17 G PO PACK: 17.0000 g | PACK | Freq: Every day | ORAL | 0 refills | Status: AC

## 2022-01-26 MED ORDER — CEFAZOLIN SODIUM-DEXTROSE 2-4 GM/100ML-% IV SOLN
2.0000 g | INTRAVENOUS | Status: AC
  Administered 2022-01-26: 2 g via INTRAVENOUS
  Filled 2022-01-26: qty 100

## 2022-01-26 MED ORDER — LISINOPRIL 20 MG PO TABS: 20.0000 mg | ORAL_TABLET | Freq: Every day | ORAL | Status: DC

## 2022-01-26 MED ORDER — SUGAMMADEX SODIUM 200 MG/2ML IV SOLN
INTRAVENOUS | Status: DC | PRN
  Administered 2022-01-26: 200 mg via INTRAVENOUS

## 2022-01-26 MED ORDER — SUCCINYLCHOLINE CHLORIDE 200 MG/10ML IV SOSY
PREFILLED_SYRINGE | INTRAVENOUS | Status: AC
  Filled 2022-01-26: qty 10

## 2022-01-26 MED ORDER — ONDANSETRON HCL 4 MG PO TABS: 4.0000 mg | ORAL_TABLET | Freq: Four times a day (QID) | ORAL | Status: DC | PRN

## 2022-01-26 MED ORDER — RISAQUAD PO CAPS
1.0000 | ORAL_CAPSULE | Freq: Every day | ORAL | Status: DC
  Administered 2022-01-26: 1 via ORAL
  Filled 2022-01-26: qty 1

## 2022-01-26 MED ORDER — OXYCODONE HCL 5 MG PO TABS
10.0000 mg | ORAL_TABLET | ORAL | Status: DC | PRN
  Administered 2022-01-26 – 2022-01-27 (×4): 10 mg via ORAL
  Filled 2022-01-26 (×4): qty 2

## 2022-01-26 SURGICAL SUPPLY — 72 items
BAG COUNTER SPONGE SURGICOUNT (BAG) ×2 IMPLANT
BAG DECANTER FOR FLEXI CONT (MISCELLANEOUS) IMPLANT
BAG SPNG CNTER NS LX DISP (BAG) ×1
BAND INSRT 18 STRL LF DISP RB (MISCELLANEOUS) ×2
BAND RUBBER #18 3X1/16 STRL (MISCELLANEOUS) ×4 IMPLANT
BUR EGG ELITE 5.0 (BURR) ×1 IMPLANT
BUR RND DIAMOND ELITE 4.0 (BURR) ×1 IMPLANT
BUR STRYKR EGG 5.0 (BURR) IMPLANT
CANNULA GRAFT BNE VG PRE-FILL (Bone Implant) IMPLANT
CARTRIDGE OIL MAESTRO DRILL (MISCELLANEOUS) IMPLANT
CLEANER TIP ELECTROSURG 2X2 (MISCELLANEOUS) ×2 IMPLANT
CNTNR URN SCR LID CUP LEK RST (MISCELLANEOUS) ×1 IMPLANT
CONT SPEC 4OZ STRL OR WHT (MISCELLANEOUS) ×2
DIFFUSER DRILL AIR PNEUMATIC (MISCELLANEOUS) IMPLANT
DISPENSER GRAFT BNE VG (MISCELLANEOUS) IMPLANT
DISPENSER VIVIGEN BONE GRAFT (MISCELLANEOUS) ×2 IMPLANT
DRAPE LAPAROTOMY 100X72X124 (DRAPES) ×2 IMPLANT
DRAPE MICROSCOPE LEICA (MISCELLANEOUS) ×2 IMPLANT
DRAPE SHEET LG 3/4 BI-LAMINATE (DRAPES) ×2 IMPLANT
DRAPE SURG 17X11 SM STRL (DRAPES) ×2 IMPLANT
DRAPE UTILITY XL STRL (DRAPES) ×2 IMPLANT
DRESSING AQUACEL AG SP 3.5X6 (GAUZE/BANDAGES/DRESSINGS) IMPLANT
DRSG AQUACEL AG ADV 3.5X 4 (GAUZE/BANDAGES/DRESSINGS) IMPLANT
DRSG AQUACEL AG ADV 3.5X 6 (GAUZE/BANDAGES/DRESSINGS) IMPLANT
DRSG AQUACEL AG SP 3.5X6 (GAUZE/BANDAGES/DRESSINGS) ×2
DRSG TELFA 3X8 NADH (GAUZE/BANDAGES/DRESSINGS) IMPLANT
DURAPREP 26ML APPLICATOR (WOUND CARE) ×2 IMPLANT
DURASEAL SPINE SEALANT 3ML (MISCELLANEOUS) IMPLANT
ELECT BLADE 4.0 EZ CLEAN MEGAD (MISCELLANEOUS)
ELECT REM PT RETURN 9FT ADLT (ELECTROSURGICAL) ×2
ELECTRODE BLDE 4.0 EZ CLN MEGD (MISCELLANEOUS) IMPLANT
ELECTRODE REM PT RTRN 9FT ADLT (ELECTROSURGICAL) ×1 IMPLANT
GLOVE BIOGEL PI IND STRL 7.5 (GLOVE) ×1 IMPLANT
GLOVE BIOGEL PI INDICATOR 7.5 (GLOVE) ×3
GLOVE SS BIOGEL STRL SZ 7 (GLOVE) ×1 IMPLANT
GLOVE SS BIOGEL STRL SZ 8 (GLOVE) ×2 IMPLANT
GLOVE SUPERSENSE BIOGEL SZ 7 (GLOVE) ×1
GLOVE SUPERSENSE BIOGEL SZ 8 (GLOVE) ×2
GLOVE SURG SS PI 8.0 STRL IVOR (GLOVE) ×2 IMPLANT
GOWN STRL REUS W/ TWL LRG LVL3 (GOWN DISPOSABLE) ×1 IMPLANT
GOWN STRL REUS W/ TWL XL LVL3 (GOWN DISPOSABLE) ×1 IMPLANT
GOWN STRL REUS W/TWL LRG LVL3 (GOWN DISPOSABLE) ×2
GOWN STRL REUS W/TWL XL LVL3 (GOWN DISPOSABLE) ×4
GRAFT BONE CANNULA VIVIGEN 3 (Bone Implant) ×4 IMPLANT
IV CATH 14GX2 1/4 (CATHETERS) ×2 IMPLANT
KIT BASIN OR (CUSTOM PROCEDURE TRAY) ×2 IMPLANT
NDL SPNL 18GX3.5 QUINCKE PK (NEEDLE) ×2 IMPLANT
NEEDLE 22X1 1/2 (OR ONLY) (NEEDLE) ×2 IMPLANT
NEEDLE SPNL 18GX3.5 QUINCKE PK (NEEDLE) ×4 IMPLANT
NS IRRIG 1000ML POUR BTL (IV SOLUTION) ×2 IMPLANT
OIL CARTRIDGE MAESTRO DRILL (MISCELLANEOUS) ×2
PACK LAMINECTOMY NEURO (CUSTOM PROCEDURE TRAY) ×2 IMPLANT
PAD DRESSING TELFA 3X8 NADH (GAUZE/BANDAGES/DRESSINGS) IMPLANT
PATTIES SURGICAL .75X.75 (GAUZE/BANDAGES/DRESSINGS) ×2 IMPLANT
SPONGE SURGIFOAM ABS GEL 100 (HEMOSTASIS) ×2 IMPLANT
SPONGE T-LAP 4X18 ~~LOC~~+RFID (SPONGE) ×1 IMPLANT
STAPLER VISISTAT (STAPLE) IMPLANT
STRIP CLOSURE SKIN 1/2X4 (GAUZE/BANDAGES/DRESSINGS) ×1 IMPLANT
SUT NURALON 4 0 TR CR/8 (SUTURE) IMPLANT
SUT PROLENE 3 0 PS 2 (SUTURE) IMPLANT
SUT VIC AB 1 CT1 27 (SUTURE) ×4
SUT VIC AB 1 CT1 27XBRD ANTBC (SUTURE) IMPLANT
SUT VIC AB 1-0 CT2 27 (SUTURE) IMPLANT
SUT VIC AB 2-0 CT1 27 (SUTURE)
SUT VIC AB 2-0 CT1 TAPERPNT 27 (SUTURE) IMPLANT
SUT VIC AB 2-0 CT2 27 (SUTURE) ×1 IMPLANT
SYR 3ML LL SCALE MARK (SYRINGE) ×2 IMPLANT
TOWEL GREEN STERILE (TOWEL DISPOSABLE) ×2 IMPLANT
TOWEL GREEN STERILE FF (TOWEL DISPOSABLE) ×2 IMPLANT
TRAY FOLEY MTR SLVR 16FR STAT (SET/KITS/TRAYS/PACK) ×2 IMPLANT
WIPE CHG CHLORHEXIDINE 2% (PERSONAL CARE ITEMS) ×2 IMPLANT
YANKAUER SUCT BULB TIP NO VENT (SUCTIONS) ×2 IMPLANT

## 2022-01-26 NOTE — Brief Op Note (Signed)
01/26/2022  7:17 AM  PATIENT:  [name]  76 y.o. male  PRE-OPERATIVE DIAGNOSIS:  Spinal Stenosis L4-5  POST-OPERATIVE DIAGNOSIS:  * No post-op diagnosis entered *  PROCEDURE:  Procedure(s) with comments: Microlumbar decompression L4-5, possible lateral mass fusion with autograft and allograft bone (N/A) - 2.5 hrs  SURGEON:  Surgeon(s) and Role:    Susa Day, MD - Primary  PHYSICIAN ASSISTANT:   ASSISTANTS: Bissell   ANESTHESIA:   general  EBL:  50   BLOOD ADMINISTERED:none  DRAINS: none   LOCAL MEDICATIONS USED:  MARCAINE     SPECIMEN:  No Specimen  DISPOSITION OF SPECIMEN:  N/A  COUNTS:  YES  TOURNIQUET:  * No tourniquets in log *  DICTATION: .Other Dictation: Dictation Number [id number]  PLAN OF CARE: Admit for overnight observation  PATIENT DISPOSITION:  PACU - hemodynamically stable.   Delay start of Pharmacological VTE agent (>24hrs) due to surgical blood loss or risk of bleeding: yes

## 2022-01-26 NOTE — Interval H&P Note (Signed)
History and Physical Interval Note:  01/26/2022 7:17 AM  [name]  has presented today for surgery, with the diagnosis of Spinal Stenosis L4-5.  The various methods of treatment have been discussed with the patient and family. After consideration of risks, benefits and other options for treatment, the patient has consented to  Procedure(s) with comments: Microlumbar decompression L4-5, possible lateral mass fusion with autograft and allograft bone (N/A) - 2.5 hrs as a surgical intervention.  The patient's history has been reviewed, patient examined, no change in status, stable for surgery.  I have reviewed the patient's chart and labs.  Questions were answered to the patient's satisfaction.     [name]

## 2022-01-26 NOTE — Op Note (Signed)
[name], [name] MEDICAL RECORD NO: [id number] ACCOUNT NO: [id number] DATE OF BIRTH: [birth year] FACILITY: [facility] LOCATION: [facility] PHYSICIAN: [name], MD  Operative Report   DATE OF PROCEDURE: 01/26/2022  PREOPERATIVE DIAGNOSES: 1.  Spinal stenosis, L4-L5. 2.  Spondylolisthesis, L4-L5. 3.  Synovial cyst, L4-L5.  POSTOPERATIVE DIAGNOSES:   1.  Spinal stenosis, L4-L5. 2.  Spondylolisthesis, L4-L5. 3.  Synovial cyst, L4-L5.  PROCEDURE PERFORMED:   1.  Central lumbar decompression at L4-L5 with bilateral L4 foraminotomies, L5 foraminotomy on the left. 2.  Excision of synovial cyst, L4-L5 facet, left. 3.  In situ lateral mass fusion utilizing autograft and allograft bone.  ANESTHESIA:  General.  ASSISTANT:  Lacie Draft, PA.  HISTORY:  A 76 year old male with severe spinal stenosis, L4-L5 near end-stage disk degeneration at L4-L5 with a slight spondylolisthesis.  He had neural foraminal stenosis to L5, L4 on the left, he had left sided symptoms.  There was a synovial cyst  there with disk herniation noted cephalad to the right.  He was indicated for Microlumbar decompression, L4-L5, decompression and lateral recess L4-L5, decompressing the L4 and L5 nerve roots.  Excision of synovial cyst if encountered and lateral mass  fusion to provide long-term stability to the slip.  Risks and benefits discussed including bleeding, infection, damage to neurovascular structures, no change in symptoms, worsening symptoms, DVT, PE, anesthetic complications, need for revision in the  future, etc.  TECHNIQUE:  With the patient in supine position.  After induction of adequate general anesthesia, 2 grams Kefzol he was placed prone on the Wilson frame.  All bony prominences were well padded.  Foley to gravity.  Lumbar region was prepped and draped in  the usual sterile fashion.  Two 18-gauge spinal needle was utilized to localize L4-L5 interspace.  Incision was made from above the spinous process  forward and below the spinous process of L5.  Subcutaneous tissue was dissected with electrocautery to  achieve hemostasis.  Dorsal lumbar fascia divided in line with skin incision.  Paraspinous muscle elevated from lamina L4-L5.  A McCulloch retractor was placed.  Operating microscope was draped and brought in the surgical field.  A confirmatory  radiograph obtained with Kochers on the spinous processes of L4 and L5.  Leksell rongeur was utilized to remove the spinous process of L4 and of L5.  High-speed bur was utilized to remove a portion of the hemilamina of L4 bilaterally.  I then used a  combination of a 2 mm Kerrison and performed a central hemilaminotomy to the point cephalad to detach the ligamentum flavum.  Following this, ligamentum flavum was then sequentially removed from the interspace.  The patient had a significantly greater  stenosis on the left than on the right.  I had skeletonized the hemilamina of L5 on the left.  Noted from the facet into the ligamentum, synovial cyst.  This extended down into the lateral recess.  I detached ligamentum flavum from the cephalad edge of  L5 utilizing a straight curette.  The cyst was identified.  I then began removing ligamentum flavum from the left.  Used a Youth worker developed a plane between the synovial cyst and the thecal sac and the lateral recess and protecting the L5 root.   I removed the ligamentum flavum.  I excised the synovial cyst.  I sent that for pathology.  Approximately 1 x 1.5 cm.  Following this, I performed a foraminotomy of L5.  Protecting the 5 root at all times.  I decompressed the lateral  recess on medial  border of the pedicle.  There was severe stenosis noted here.  I then continued cephalad.  There was no disk herniation noted.  There was some ligamentum flavum folding into the foramen on the left.  I used a nerve hook to identify the foramen.  I  protected the 4 root with a Woodson retractor.  I used a 2 mm Kerrison to  remove the ligamentum flavum that extended out into the foramen.  This was done from cephalad to caudad, medial to lateral.  Following this, a Woodson probe passed freely at the  foramen of 4 and 5.  On the right, I removed the ligamentum flavum centrally and to the lateral recess.  Although the foramen were opened bilaterally.  No disk herniation was noted there.  Confirmatory radiograph obtained with a Woodson in the foramen of  4 and of 5.  Bipolar cautery was utilized to achieve hemostasis throughout as was bone wax.  Bone was saved from the removal of the spinous processes.  This was morselized.  After I irrigated the wound, I covered decompression with patties and began the  lateral mass fusion.  I developed a plane over the facet.  There was a lot of adipose tissue that had fatty replacement of his paraspinous musculature.  I identified the pars cephalad and the facet caudad, extended the plane out from the lower portion  of the facet to the transverse process of L5.  The lateral aspect of the pars and TP, they were decorticated with a curette and the lateral aspect of the facet.  Bone graft, autograft was placed lateral to the facet utilizing the Spoon..  I then used the  DBX putty delivered via caulking gun and delivered it into the lateral to the facet.  Also, over the TP of 5 and then lateral to the pars and above the TP of 4.  This was impacted digitally.  Bipolar cautery was utilized to achieve hemostasis.  I then  performed an identical fusion bed on the left, utilizing autograft and the allograft, DBX putty.  This was packed lateral to the facets after curette was utilized for decortication of the TP lateral facet and cephalad to the pars. There was ample pars  following the decompression.  Following this, the Bipolar cautery was utilized to achieve strict hemostasis.  I copiously irrigated the wound with irrigation, multiple times.  No evidence of active bleeding or CSF leakage.  There was no  bone fragments  with a defect.  I placed 3 small 2 x 2 mm thrombin-soaked patties in the lateral recess on the left.  Irrigated the paraspinous musculature copiously.  No active bleeding was noted.  I then noted that the dorsal lumbar fascia was reapproximated with #1  Vicryl in interrupted figure-of-eight sutures.  The small aperture 1 cm distal of the incision was left open for any drainage that might occur.  Copiously irrigated the subcutaneous tissue with 2-0 I closed it and then with staples.  Again, a small  aperture distally was noted.  Sterile dressing applied.  Placed supine on the hospital bed, extubated without difficulty and transported to the recovery room in satisfactory condition.  The patient tolerated the procedure well.  No complications.  Assistant, Lacie Draft, [place], was used throughout the case for patient positioning, retraction, suction and closure.   PUS D: 01/26/2022 11:04:32 am T: 01/26/2022 1:54:00 pm  JOB: [id number]/ [id number]

## 2022-01-26 NOTE — Anesthesia Procedure Notes (Signed)
Procedure Name: Intubation Date/Time: 01/26/2022 7:42 AM  Performed by: Lowella Dell, CRNAPre-anesthesia Checklist: Patient identified, Emergency Drugs available, Suction available and Patient being monitored Patient Re-evaluated:Patient Re-evaluated prior to induction Oxygen Delivery Method: Circle System Utilized Preoxygenation: Pre-oxygenation with 100% oxygen Induction Type: IV induction Ventilation: Mask ventilation without difficulty and Oral airway inserted - appropriate to patient size Laryngoscope Size: Mac and 4 Grade View: Grade I Tube type: Oral Tube size: 7.5 mm Number of attempts: 1 Airway Equipment and Method: Stylet Placement Confirmation: ETT inserted through vocal cords under direct vision, positive ETCO2 and breath sounds checked- equal and bilateral Secured at: 23 cm Tube secured with: Tape Dental Injury: Teeth and Oropharynx as per pre-operative assessment

## 2022-01-26 NOTE — Anesthesia Postprocedure Evaluation (Signed)
Anesthesia Post Note  Patient: [name]  Procedure(s) Performed: Microlumbar decompression Lumbar four-five, lateral mass fusion with autograft and allograft bone     Patient location during evaluation: PACU Anesthesia Type: General Level of consciousness: awake and alert, patient cooperative and oriented Pain management: pain level controlled (pain improving) Vital Signs Assessment: post-procedure vital signs reviewed and stable Respiratory status: spontaneous breathing, nonlabored ventilation and respiratory function stable Cardiovascular status: blood pressure returned to baseline and stable Postop Assessment: no apparent nausea or vomiting Anesthetic complications: no   No notable events documented.  Last Vitals:  Vitals:   01/26/22 1105 01/26/22 1120  BP: (!) 142/76 133/64  Pulse: 74 73  Resp: 17 17  Temp: 36.5 C   SpO2: 94% 98%    Last Pain:  Vitals:   01/26/22 1144  TempSrc:   PainSc: 7                  Zak Gondek,E. Jailey Booton

## 2022-01-26 NOTE — Transfer of Care (Signed)
Immediate Anesthesia Transfer of Care Note  Patient: [name]  Procedure(s) Performed: Microlumbar decompression Lumbar four-five, lateral mass fusion with autograft and allograft bone  Patient Location: PACU  Anesthesia Type:General  Level of Consciousness: awake and patient cooperative  Airway & Oxygen Therapy: Patient Spontanous Breathing  Post-op Assessment: Report given to RN, Post -op Vital signs reviewed and stable and Patient moving all extremities X 4  Post vital signs: Reviewed and stable  Last Vitals:  Vitals Value Taken Time  BP 142/76 01/26/22 1103  Temp    Pulse 74 01/26/22 1105  Resp 17 01/26/22 1105  SpO2 94 % 01/26/22 1105  Vitals shown include unvalidated device data.  Last Pain:  Vitals:   01/26/22 0641  TempSrc:   PainSc: 0-No pain      Patients Stated Pain Goal: 2 (87/21/58 7276)  Complications: No notable events documented.

## 2022-01-26 NOTE — Discharge Instructions (Signed)

## 2022-01-26 NOTE — Progress Notes (Signed)
Orthopedic Tech Progress Note Patient Details:  [name] [birth year] [id number]   Ortho Devices Type of Ortho Device: Lumbar corsett Ortho Device/Splint Location: at bedside Ortho Device/Splint Interventions: Ordered   Post Interventions Instructions Provided: Care of device, Adjustment of device  [name] 01/26/2022, 12:07 PM

## 2022-01-27 ENCOUNTER — Encounter (HOSPITAL_COMMUNITY): Payer: Self-pay | Admitting: Specialist

## 2022-01-27 DIAGNOSIS — I639 Cerebral infarction, unspecified: Secondary | ICD-10-CM | POA: Diagnosis not present

## 2022-01-27 DIAGNOSIS — M25531 Pain in right wrist: Secondary | ICD-10-CM | POA: Diagnosis not present

## 2022-01-27 DIAGNOSIS — Z885 Allergy status to narcotic agent status: Secondary | ICD-10-CM | POA: Diagnosis not present

## 2022-01-27 DIAGNOSIS — Z6831 Body mass index (BMI) 31.0-31.9, adult: Secondary | ICD-10-CM | POA: Diagnosis not present

## 2022-01-27 DIAGNOSIS — I10 Essential (primary) hypertension: Secondary | ICD-10-CM | POA: Diagnosis present

## 2022-01-27 DIAGNOSIS — R609 Edema, unspecified: Secondary | ICD-10-CM | POA: Diagnosis not present

## 2022-01-27 DIAGNOSIS — C439 Malignant melanoma of skin, unspecified: Secondary | ICD-10-CM | POA: Diagnosis not present

## 2022-01-27 DIAGNOSIS — M549 Dorsalgia, unspecified: Secondary | ICD-10-CM | POA: Diagnosis not present

## 2022-01-27 DIAGNOSIS — M009 Pyogenic arthritis, unspecified: Secondary | ICD-10-CM | POA: Diagnosis not present

## 2022-01-27 DIAGNOSIS — K769 Liver disease, unspecified: Secondary | ICD-10-CM | POA: Diagnosis not present

## 2022-01-27 DIAGNOSIS — M79603 Pain in arm, unspecified: Secondary | ICD-10-CM | POA: Diagnosis not present

## 2022-01-27 DIAGNOSIS — J9811 Atelectasis: Secondary | ICD-10-CM | POA: Diagnosis not present

## 2022-01-27 DIAGNOSIS — M11221 Other chondrocalcinosis, right elbow: Secondary | ICD-10-CM | POA: Diagnosis present

## 2022-01-27 DIAGNOSIS — B999 Unspecified infectious disease: Secondary | ICD-10-CM | POA: Diagnosis not present

## 2022-01-27 DIAGNOSIS — C4490 Unspecified malignant neoplasm of skin, unspecified: Secondary | ICD-10-CM | POA: Diagnosis not present

## 2022-01-27 DIAGNOSIS — R2231 Localized swelling, mass and lump, right upper limb: Secondary | ICD-10-CM | POA: Diagnosis not present

## 2022-01-27 DIAGNOSIS — M19031 Primary osteoarthritis, right wrist: Secondary | ICD-10-CM | POA: Diagnosis not present

## 2022-01-27 DIAGNOSIS — I959 Hypotension, unspecified: Secondary | ICD-10-CM | POA: Diagnosis not present

## 2022-01-27 DIAGNOSIS — K4020 Bilateral inguinal hernia, without obstruction or gangrene, not specified as recurrent: Secondary | ICD-10-CM | POA: Diagnosis not present

## 2022-01-27 DIAGNOSIS — E7800 Pure hypercholesterolemia, unspecified: Secondary | ICD-10-CM | POA: Diagnosis present

## 2022-01-27 DIAGNOSIS — K661 Hemoperitoneum: Secondary | ICD-10-CM | POA: Diagnosis not present

## 2022-01-27 DIAGNOSIS — Z8673 Personal history of transient ischemic attack (TIA), and cerebral infarction without residual deficits: Secondary | ICD-10-CM | POA: Diagnosis not present

## 2022-01-27 DIAGNOSIS — R918 Other nonspecific abnormal finding of lung field: Secondary | ICD-10-CM | POA: Diagnosis not present

## 2022-01-27 DIAGNOSIS — C7801 Secondary malignant neoplasm of right lung: Secondary | ICD-10-CM | POA: Diagnosis present

## 2022-01-27 DIAGNOSIS — C801 Malignant (primary) neoplasm, unspecified: Secondary | ICD-10-CM | POA: Diagnosis not present

## 2022-01-27 DIAGNOSIS — I499 Cardiac arrhythmia, unspecified: Secondary | ICD-10-CM | POA: Diagnosis not present

## 2022-01-27 DIAGNOSIS — M25521 Pain in right elbow: Secondary | ICD-10-CM | POA: Diagnosis present

## 2022-01-27 DIAGNOSIS — M79605 Pain in left leg: Secondary | ICD-10-CM | POA: Diagnosis not present

## 2022-01-27 DIAGNOSIS — G9389 Other specified disorders of brain: Secondary | ICD-10-CM | POA: Diagnosis not present

## 2022-01-27 DIAGNOSIS — I700 Atherosclerosis of aorta: Secondary | ICD-10-CM | POA: Diagnosis not present

## 2022-01-27 DIAGNOSIS — I2721 Secondary pulmonary arterial hypertension: Secondary | ICD-10-CM | POA: Diagnosis present

## 2022-01-27 DIAGNOSIS — E669 Obesity, unspecified: Secondary | ICD-10-CM | POA: Diagnosis present

## 2022-01-27 DIAGNOSIS — Z79899 Other long term (current) drug therapy: Secondary | ICD-10-CM | POA: Diagnosis not present

## 2022-01-27 DIAGNOSIS — M7989 Other specified soft tissue disorders: Secondary | ICD-10-CM | POA: Diagnosis not present

## 2022-01-27 DIAGNOSIS — R590 Localized enlarged lymph nodes: Secondary | ICD-10-CM | POA: Diagnosis not present

## 2022-01-27 DIAGNOSIS — Z8711 Personal history of peptic ulcer disease: Secondary | ICD-10-CM | POA: Diagnosis not present

## 2022-01-27 DIAGNOSIS — Z96641 Presence of right artificial hip joint: Secondary | ICD-10-CM | POA: Diagnosis present

## 2022-01-27 LAB — CBC
HCT: 36.6 % — ABNORMAL LOW (ref 39.0–52.0)
Hemoglobin: 12.6 g/dL — ABNORMAL LOW (ref 13.0–17.0)
MCH: 30.5 pg (ref 26.0–34.0)
MCHC: 34.4 g/dL (ref 30.0–36.0)
MCV: 88.6 fL (ref 80.0–100.0)
Platelets: 220 10*3/uL (ref 150–400)
RBC: 4.13 MIL/uL — ABNORMAL LOW (ref 4.22–5.81)
RDW: 13.1 % (ref 11.5–15.5)
WBC: 21.3 10*3/uL — ABNORMAL HIGH (ref 4.0–10.5)
nRBC: 0 % (ref 0.0–0.2)

## 2022-01-27 LAB — BASIC METABOLIC PANEL
Anion gap: 6 (ref 5–15)
BUN: 23 mg/dL (ref 8–23)
CO2: 25 mmol/L (ref 22–32)
Calcium: 8.1 mg/dL — ABNORMAL LOW (ref 8.9–10.3)
Chloride: 102 mmol/L (ref 98–111)
Creatinine, Ser: 1.07 mg/dL (ref 0.61–1.24)
GFR, Estimated: >60 mL/min (ref >60)
Glucose, Bld: 128 mg/dL — ABNORMAL HIGH (ref 70–99)
Potassium: 3.8 mmol/L (ref 3.5–5.1)
Sodium: 133 mmol/L — ABNORMAL LOW (ref 135–145)

## 2022-01-27 LAB — SURGICAL PATHOLOGY

## 2022-01-27 NOTE — Discharge Summary (Signed)
Physician Discharge Summary   Patient ID: [name] MRN: [id number] DOB/AGE: 1946/07/23 76 y.o.  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 , 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    Discharge Diagnoses:   Principal Problem:   Spinal stenosis at L4-L5 level  Procedure:  Procedure(s) (LRB): Microlumbar decompression Lumbar four-five, lateral mass fusion with autograft and allograft bone (N/A)   Consults: None  HPI:  See H&P    Laboratory Data: Hospital Outpatient Visit on 01/19/2022  Component Date Value Ref Range Status   Sodium 01/19/2022 138  135 - 145 mmol/L Final   Potassium 01/19/2022 4.1  3.5 - 5.1 mmol/L Final   Chloride 01/19/2022 104  98 - 111 mmol/L Final   CO2 01/19/2022 25  22 - 32 mmol/L Final   Glucose, Bld 01/19/2022 104 (H)  70 - 99 mg/dL Final   Glucose reference range applies only to samples taken after fasting for at least 8 hours.   BUN 01/19/2022 26 (H)  8 - 23 mg/dL Final   Creatinine, Ser 01/19/2022 1.04  0.61 - 1.24 mg/dL Final   Calcium 01/19/2022 9.1  8.9 - 10.3 mg/dL Final   GFR, Estimated 01/19/2022 >60  >60 mL/min Final   Comment: (NOTE) Calculated using the CKD-EPI Creatinine Equation (2021)    Anion gap 01/19/2022 9  5 - 15 Final   Performed at [facility], [place] [address] [place], [place] [address]   WBC 01/19/2022 12.9 (H)  4.0 - 10.5 K/uL Final   RBC 01/19/2022 4.78  4.22 - 5.81 MIL/uL Final   Hemoglobin 01/19/2022 14.5  13.0 - 17.0 g/dL Final   HCT 01/19/2022 41.8  39.0 - 52.0 % Final   MCV 01/19/2022 87.4  80.0 - 100.0 fL Final   MCH 01/19/2022 30.3  26.0 - 34.0 pg Final   MCHC 01/19/2022 34.7  30.0 - 36.0 g/dL Final    RDW 01/19/2022 12.9  11.5 - 15.5 % Final   Platelets 01/19/2022 290  150 - 400 K/uL Final   nRBC 01/19/2022 0.0  0.0 - 0.2 % Final   Performed at [facility] [address], [place], [place] [address]   ABO/RH(D) 01/19/2022 O POS   Final   Antibody Screen 01/19/2022 NEG   Final   Sample Expiration 01/19/2022 02/02/2022,2359   Final   Extend sample reason 01/19/2022    Final                   Value:NO TRANSFUSIONS OR PREGNANCY IN THE PAST 3 MONTHS Performed at [facility], [place] [address], [place], [place] [address]    MRSA, PCR 01/19/2022 NEGATIVE  NEGATIVE Final   Staphylococcus aureus 01/19/2022 NEGATIVE  NEGATIVE Final   Comment: (NOTE) The Xpert SA Assay (FDA approved for NASAL specimens in patients 28 years of age and older), is one component of a comprehensive surveillance program. It is not intended to diagnose infection nor to guide or monitor treatment. Performed at [facility], [place] [address], [place], [name] [address]    No results for input(s): "HGB" in the last 72  hours. No results for input(s): "WBC", "RBC", "HCT", "PLT" in the last 72 hours. No results for input(s): "NA", "K", "CL", "CO2", "BUN", "CREATININE", "GLUCOSE", "CALCIUM" in the last 72 hours. No results for input(s): "LABPT", "INR" in the last 72 hours.  X-Rays:DG Lumbar Spine 2-3 Views  Result Date: 01/26/2022 CLINICAL DATA:  [id number] intraoperative lumbar spine imaging for localization of the intervertebral discs EXAM: LUMBAR SPINE - 2-3 VIEW COMPARISON:  Lumbar spine performed on the same day earlier. FINDINGS: Three cross-table lateral views of the lumbar spine were obtained for interpretation. There are surgical instruments and marker needles seen with tip of the marker needle projecting at the posterior aspect of the L5-S1 disc level in image 2 and 1 of the marker needles at the lamina/disc space at L5-S1 and and second surgical instrument at the level of posterior mid body of L4  vertebral body in the image 3. There has been no significant interval change in the severe lumbar spondylosis. IMPRESSION: Surgical marking needles as described above Electronically Signed   By: Frazier Richards M.D.   On: 01/26/2022 10:27   DG Lumbar Spine 2-3 Views  Addendum Date: 01/26/2022   ADDENDUM REPORT: 01/26/2022 10:11 ADDENDUM: Please note that this study was performed at 6:15 am on 01/26/2022 not pm as it was initially mentioned on the lumbar spine X-rays. Electronically Signed   By: Frazier Richards M.D.   On: 01/26/2022 10:11   Result Date: 01/26/2022 CLINICAL DATA:  [id number] preoperative study EXAM: LUMBAR SPINE - 2-3 VIEW COMPARISON:  None Available. FINDINGS: Severe lumbar spondylosis with loss of the disc space from T12 through L5. Approximally 4 mm retrolisthesis at L1-L2, 7 mm retrolisthesis at L2-L3, 5 mm anterolisthesis at L4-L5 and 3 mm anterolisthesis at L5-S1. There are prominent marginal osteophytes and endplate sclerotic changes seen. Moderate facet hypertrophic changes as well in the lower lumbar spine. There is no evidence of lumbar spine fracture. IMPRESSION: Severe lumbar spondylosis with loss of the disc space, prominent marginal osteophytes and endplate sclerotic changes. Retrolisthesis and anterolisthesis as above. Electronically Signed: By: Frazier Richards M.D. On: 01/26/2022 08:28    EKG: Orders placed or performed during the hospital encounter of 01/19/22   EKG 12 lead per protocol   EKG 12 lead per protocol     Hospital Course: Patient was admitted to [facility] and taken to the OR and underwent the above state procedure without complications.  Patient tolerated the procedure well and was later transferred to the recovery room and then to the orthopaedic floor for postoperative care.  They were given PO and IV analgesics for pain control following their surgery.  They were given 24 hours of postoperative antibiotics.   PT was consulted postop to assist with mobility  and transfers.  The patient was allowed to be WBAT with therapy and was taught back precautions. Discharge planning was consulted to help with postop disposition and equipment needs.  Patient had a good night on the evening of surgery and started to get up OOB with therapy on day one. Patient was seen in rounds and was ready to go home on day one.  They were given discharge instructions and dressing directions.  They were instructed on when to follow up in the office with [name].   Diet: Regular diet Activity:WBAT Follow-up:in 10-14 days Disposition - Home Discharged Condition: good   Discharge Instructions     Call MD / Call 911   Complete by: As directed    If  you experience chest pain or shortness of breath, CALL 911 and be transported to the hospital emergency room.  If you develope a fever above 101 F, pus (white drainage) or increased drainage or redness at the wound, or calf pain, call your surgeon's office.   Constipation Prevention   Complete by: As directed    Drink plenty of fluids.  Prune juice may be helpful.  You may use a stool softener, such as Colace (over the counter) 100 mg twice a day.  Use MiraLax (over the counter) for constipation as needed.   Diet - low sodium heart healthy   Complete by: As directed    Increase activity slowly as tolerated   Complete by: As directed    Post-operative opioid taper instructions:   Complete by: As directed    POST-OPERATIVE OPIOID TAPER INSTRUCTIONS: It is important to wean off of your opioid medication as soon as possible. If you do not need pain medication after your surgery it is ok to stop day one. Opioids include: Codeine, Hydrocodone(Norco, Vicodin), Oxycodone(Percocet, oxycontin) and hydromorphone amongst others.  Long term and even short term use of opiods can cause: Increased pain response Dependence Constipation Depression Respiratory depression And more.  Withdrawal symptoms can include Flu like  symptoms Nausea, vomiting And more Techniques to manage these symptoms Hydrate well Eat regular healthy meals Stay active Use relaxation techniques(deep breathing, meditating, yoga) Do Not substitute Alcohol to help with tapering If you have been on opioids for less than two weeks and do not have pain than it is ok to stop all together.  Plan to wean off of opioids This plan should start within one week post op of your joint replacement. Maintain the same interval or time between taking each dose and first decrease the dose.  Cut the total daily intake of opioids by one tablet each day Next start to increase the time between doses. The last dose that should be eliminated is the evening dose.         Allergies as of 01/27/2022       Reactions   Morphine Itching   Patient states it is tolerable itching        Medication List     STOP taking these medications    cefdinir 300 MG capsule Commonly known as: OMNICEF   clopidogrel 75 MG tablet Commonly known as: PLAVIX   Fish Oil 1000 MG Caps   ibuprofen 200 MG tablet Commonly known as: ADVIL   naproxen sodium 220 MG tablet Commonly known as: ALEVE   oxyCODONE-acetaminophen 5-325 MG tablet Commonly known as: PERCOCET/ROXICET   pravastatin 40 MG tablet Commonly known as: PRAVACHOL       TAKE these medications    amLODipine 5 MG tablet Commonly known as: NORVASC Take 5 mg by mouth in the morning.   docusate sodium 100 MG capsule Commonly known as: Colace Take 1 capsule (100 mg total) by mouth 2 (two) times daily as needed for mild constipation.   famotidine 20 MG tablet Commonly known as: PEPCID Take 20 mg by mouth at bedtime as needed for heartburn or indigestion.   Flaxseed Oil 1000 MG Caps Take 1,000 mg by mouth in the morning.   folic acid 175 MCG tablet Commonly known as: FOLVITE Take 400 mcg by mouth in the morning.   gabapentin 100 MG capsule Commonly known as: NEURONTIN Take 300 mg by mouth  3 (three) times daily.   hydrochlorothiazide 12.5 MG tablet Commonly known as: HYDRODIURIL Take  12.5 mg by mouth in the morning.   lisinopril 20 MG tablet Commonly known as: ZESTRIL Take 20 mg by mouth in the morning.   multivitamin with minerals Tabs tablet Take 1 tablet by mouth in the morning.   oxyCODONE 5 MG immediate release tablet Commonly known as: Oxy IR/ROXICODONE Take 1 tablet (5 mg total) by mouth every 4 (four) hours as needed for severe pain.   polyethylene glycol 17 g packet Commonly known as: MIRALAX / GLYCOLAX Take 17 g by mouth daily as needed (constipation.). What changed: Another medication with the same name was added. Make sure you understand how and when to take each.   polyethylene glycol 17 g packet Commonly known as: MIRALAX / GLYCOLAX Take 17 g by mouth daily. What changed: You were already taking a medication with the same name, and this prescription was added. Make sure you understand how and when to take each.   psyllium 58.6 % powder Commonly known as: METAMUCIL Take 1 packet by mouth daily as needed (constipation).   traZODone 50 MG tablet Commonly known as: DESYREL Take 50 mg by mouth at bedtime as needed.        Follow-up Information     Susa Day, MD Follow up in 2 week(s).   Specialty: Orthopedic Surgery Contact information: [address] [place] [address] [phone]                 Signed: Lacie Draft, PA-C Orthopaedic Surgery 01/27/2022, 8:00 AM

## 2022-01-27 NOTE — Progress Notes (Signed)
Pt doing well. Pt and wife given D/C instructions with verbal understanding. Rx's were sent to the pharmacy by MD. Pt's incision is clean and dry with no sign of infection. Pt's IV was removed prior to D/C. Pt D/C'd home via wheelchair per MD order. Pt is stable @ D/C and has no other needs at this time. Mariachristina Holle, RN  

## 2022-01-27 NOTE — Evaluation (Signed)
Occupational Therapy Evaluation Patient Details Name: [name] MRN: [id number] DOB: 03/05/1946 Today's Date: 01/27/2022   History of Present Illness 76 yo s/p 01/26/22 microlumbar decompression L4-5 lateral mass fusion with autograft and allograft PMH arthritis, colon polyps, kidney stones, HTN peptic ulcer, CVA R cerebellar residual ataxia   Clinical Impression   Patient evaluated by Occupational Therapy with no further acute OT needs identified. All education has been completed and the patient has no further questions. See below for any follow-up Occupational Therapy or equipment needs. OT to sign off. Thank you for referral.        Recommendations for follow up therapy are one component of a multi-disciplinary discharge planning process, led by the attending physician.  Recommendations may be updated based on patient status, additional functional criteria and insurance authorization.   Follow Up Recommendations  No OT follow up    Assistance Recommended at Discharge None  Patient can return home with the following Assist for transportation    Functional Status Assessment  Patient has had a recent decline in their functional status and demonstrates the ability to make significant improvements in function in a reasonable and predictable amount of time.  Equipment Recommendations  None recommended by OT    Recommendations for Other Services       Precautions / Restrictions Precautions Precautions: Back Precaution Booklet Issued: Yes (comment) Precaution Comments: handout provided and reviewed Required Braces or Orthoses: Spinal Brace Spinal Brace: Lumbar corset;Applied in standing position Restrictions Weight Bearing Restrictions: No      Mobility Bed Mobility Overal bed mobility: Modified Independent                  Transfers Overall transfer level: Needs assistance Equipment used: Rolling walker (2 wheels) Transfers: Sit to/from Stand Sit to Stand:  Supervision           General transfer comment: cues for hand placement      Balance Overall balance assessment: Mild deficits observed, not formally tested                                         ADL either performed or assessed with clinical judgement   ADL Overall ADL's : Needs assistance/impaired Eating/Feeding: Independent   Grooming: Wash/dry hands;Oral care;Modified independent   Upper Body Bathing: Modified independent   Lower Body Bathing: Modified independent;Sit to/from stand;Cueing for back precautions Lower Body Bathing Details (indicate cue type and reason): able to figure 4 cross Upper Body Dressing : Minimal assistance Upper Body Dressing Details (indicate cue type and reason): required (A) with back brace and education. pt does best with flipping the brace over his head to help align it. pt is placing the brace inside out when trying to spin it around himself in a seated position Lower Body Dressing: Modified independent;Cueing for back precautions Lower Body Dressing Details (indicate cue type and reason): figur 4 crossing Toilet Transfer: Modified Independent Toilet Transfer Details (indicate cue type and reason): educated on the need to have help with the toilet lid, sitting or having one toilet the lid stays up until he can squat down properly. pt also encouraged to take the urinal home with him         Functional mobility during ADLs: Modified independent General ADL Comments: wife present for all education and expressed concerns over one small dog in the home. reviewed safety with dogs and makign sure  the dogs are up on entering the home. educated on turning on lights at night for evening transfers  Back handout provided and reviewed adls in detail. Pt educated on: clothing between brace, never sleep in brace, set an alarm at night for medication, avoid sitting for long periods of time, correct bed positioning for sleeping, correct sequence  for bed mobility, avoiding lifting more than 5 pounds and never wash directly over incision. All education is complete and patient indicates understanding.    Vision Baseline Vision/History: 1 Wears glasses Additional Comments: does not have glasses present in acute. Decided to leave them home     Perception     Praxis      Pertinent Vitals/Pain Pain Assessment Pain Assessment: Faces Faces Pain Scale: Hurts a little bit Pain Location: back - incision site Pain Descriptors / Indicators: Grimacing, Sore Pain Intervention(s): Monitored during session, Premedicated before session, Repositioned     Hand Dominance Right   Extremity/Trunk Assessment Upper Extremity Assessment Upper Extremity Assessment: Overall WFL for tasks assessed   Lower Extremity Assessment Lower Extremity Assessment: Defer to PT evaluation   Cervical / Trunk Assessment Cervical / Trunk Assessment: Back Surgery   Communication Communication Communication: No difficulties   Cognition Arousal/Alertness: Awake/alert Behavior During Therapy: WFL for tasks assessed/performed Overall Cognitive Status: Within Functional Limits for tasks assessed                                       General Comments  drying intact, noted small area of blood under dressing. Requested the RN give detail about the medications that have been taken and when they are due. Showed the wife how to set alarm on her iphone and label it for easy use daily    Exercises     Shoulder Instructions      Home Living Family/patient expects to be discharged to:: Private residence Living Arrangements: Spouse/significant other Available Help at Discharge: Family;Available 24 hours/day Type of Home: House Home Access: Stairs to enter [employer] of Steps: threshold Entrance Stairs-Rails: None Home Layout: One level (has basement -)     Bathroom Shower/Tub: Walk-in Corporate treasurer Toilet: Handicapped height      Home Equipment: Conservation officer, nature (2 wheels);Rollator (4 wheels);Cane - single point;BSC/3in1;Shower seat - built in;Grab bars - tub/shower;Hand held shower head   Additional Comments: 2 dogs ( one dog sleeps with him)      Prior Functioning/Environment Prior Level of Function : Independent/Modified Independent             Mobility Comments: uses cane mostly and was ambulating short distances prior to sx          OT Problem List:        OT Treatment/Interventions:      OT Goals(Current goals can be found in the care plan section) Acute Rehab OT Goals Patient Stated Goal: to dc home today Potential to Achieve Goals: Good  OT Frequency:      Co-evaluation              AM-PAC OT "6 Clicks" Daily Activity     Outcome Measure Help from another person eating meals?: None Help from another person taking care of personal grooming?: None Help from another person toileting, which includes using toliet, bedpan, or urinal?: None Help from another person bathing (including washing, rinsing, drying)?: None Help from another person to put on and taking  off regular upper body clothing?: None Help from another person to put on and taking off regular lower body clothing?: None 6 Click Score: 24   End of Session Equipment Utilized During Treatment: Rolling walker (2 wheels);Back brace Nurse Communication: Mobility status;Precautions  Activity Tolerance: Patient tolerated treatment well Patient left: in bed;with call bell/phone within reach;with family/visitor present  OT Visit Diagnosis: Unsteadiness on feet (R26.81);Muscle weakness (generalized) (M62.81)                Time: 7517-0017 OT Time Calculation (min): 30 min Charges:  OT General Charges $OT Visit: 1 Visit OT Evaluation $OT Eval Moderate Complexity: 1 Mod OT Treatments $Self Care/Home Management : 8-22 mins   Brynn, OTR/L  Acute Rehabilitation Services Office: [phone] .   Jeri Modena 01/27/2022,  10:14 AM

## 2022-01-27 NOTE — Evaluation (Signed)
Physical Therapy Evaluation Patient Details Name: [name] MRN: [id number] DOB: 04/07/1946 Today's Date: 01/27/2022  History of Present Illness  76 yo s/p 01/26/22 microlumbar decompression L4-5 lateral mass fusion with autograft and allograft PMH arthritis, colon polyps, kidney stones, HTN peptic ulcer, CVA R cerebellar residual ataxia  Clinical Impression  Patient admitted following above procedure. PTA, patient lives with wife and uses cane for mobility but only ambulated short distances due to pain. Patient currently functioning at supervision level with RW. Educated patient on back precautions, brace wear, car transfer, and activity progression, patient verbalized and demonstrated understanding. Patient will have wife to provide necessary assistance at discharge. No further skilled PT needs identified acutely. All education completed. No PT follow up recommended at this time.         Recommendations for follow up therapy are one component of a multi-disciplinary discharge planning process, led by the attending physician.  Recommendations may be updated based on patient status, additional functional criteria and insurance authorization.  Follow Up Recommendations No PT follow up    Assistance Recommended at Discharge PRN  Patient can return home with the following       Equipment Recommendations None recommended by PT  Recommendations for Other Services       Functional Status Assessment Patient has had a recent decline in their functional status and demonstrates the ability to make significant improvements in function in a reasonable and predictable amount of time.     Precautions / Restrictions Precautions Precautions: Back Precaution Booklet Issued: Yes (comment) Required Braces or Orthoses: Spinal Brace Spinal Brace: Lumbar corset;Applied in standing position Restrictions Weight Bearing Restrictions: No      Mobility  Bed Mobility Overal bed mobility: Modified  Independent             General bed mobility comments: instructed on log roll technique    Transfers Overall transfer level: Needs assistance Equipment used: Rolling Amos Gaber (2 wheels) Transfers: Sit to/from Stand Sit to Stand: Supervision           General transfer comment: able to stand from low bed surface with supervision. Cues for hand placement    Ambulation/Gait Ambulation/Gait assistance: Supervision Gait Distance (Feet): 300 Feet Assistive device: Rolling Wanda Rideout (2 wheels) Gait Pattern/deviations: Step-through pattern, Decreased stride length, Trunk flexed Gait velocity: decreased     General Gait Details: ambulated with RW safely and with no LOB  Stairs            Wheelchair Mobility    Modified Rankin (Stroke Patients Only)       Balance Overall balance assessment: Mild deficits observed, not formally tested                                           Pertinent Vitals/Pain Pain Assessment Pain Assessment: Faces Faces Pain Scale: Hurts a little bit Pain Location: back - incision site Pain Descriptors / Indicators: Grimacing, Sore Pain Intervention(s): Monitored during session, Repositioned    Home Living Family/patient expects to be discharged to:: Private residence Living Arrangements: Spouse/significant other Available Help at Discharge: Family;Available 24 hours/day Type of Home: House Home Access: Stairs to enter Entrance Stairs-Rails: None Entrance Stairs-Number of Steps: threshold   Home Layout: One level (has basement -) Home Equipment: Conservation officer, nature (2 wheels);Rollator (4 wheels);Cane - single point;BSC/3in1;Shower seat - built in;Grab bars - tub/shower;Hand held shower head  Prior Function Prior Level of Function : Independent/Modified Independent             Mobility Comments: uses cane mostly and was ambulating short distances prior to sx       Hand Dominance        Extremity/Trunk  Assessment   Upper Extremity Assessment Upper Extremity Assessment: Defer to OT evaluation    Lower Extremity Assessment Lower Extremity Assessment: Generalized weakness    Cervical / Trunk Assessment Cervical / Trunk Assessment: Back Surgery  Communication   Communication: No difficulties  Cognition Arousal/Alertness: Awake/alert Behavior During Therapy: WFL for tasks assessed/performed Overall Cognitive Status: Within Functional Limits for tasks assessed                                          General Comments      Exercises     Assessment/Plan    PT Assessment Patient does not need any further PT services  PT Problem List         PT Treatment Interventions      PT Goals (Current goals can be found in the Care Plan section)  Acute Rehab PT Goals Patient Stated Goal: to go home PT Goal Formulation: All assessment and education complete, DC therapy    Frequency       Co-evaluation               AM-PAC PT "6 Clicks" Mobility  Outcome Measure Help needed turning from your back to your side while in a flat bed without using bedrails?: None Help needed moving from lying on your back to sitting on the side of a flat bed without using bedrails?: None Help needed moving to and from a bed to a chair (including a wheelchair)?: None Help needed standing up from a chair using your arms (e.g., wheelchair or bedside chair)?: None Help needed to walk in hospital room?: None Help needed climbing 3-5 steps with a railing? : A Little 6 Click Score: 23    End of Session Equipment Utilized During Treatment: Back brace Activity Tolerance: Patient tolerated treatment well Patient left: in bed;with call bell/phone within reach Nurse Communication: Mobility status PT Visit Diagnosis: Muscle weakness (generalized) (M62.81)    Time: 0258-5277 PT Time Calculation (min) (ACUTE ONLY): 20 min   Charges:   PT Evaluation $PT Eval Low Complexity: 1 Low           Yohanna Tow A. Gilford Rile PT, DPT Acute Rehabilitation Services Office [phone]   Linna Hoff 01/27/2022, 8:43 AM

## 2022-01-27 NOTE — Progress Notes (Signed)
Subjective: 1 Day Post-Op Procedure(s) (LRB): Microlumbar decompression Lumbar four-five, lateral mass fusion with autograft and allograft bone (N/A) Patient reports pain as mild.  No leg pain. Reports incisional pain. No N/V, CP, SOB, fever, chills. Some dysuria yesterday. No other c/o.  Objective: Vital signs in last 24 hours: Temp:  [97.4 F (36.3 C)-99.4 F (37.4 C)] 99 F (37.2 C) (06/09 0730) Pulse Rate:  [73-84] 73 (06/09 0730) Resp:  [17-20] 18 (06/09 0730) BP: (114-158)/(52-76) 114/54 (06/09 0730) SpO2:  [94 %-99 %] 94 % (06/09 0730)  Intake/Output from previous day: 06/08 0701 - 06/09 0700 In: 1200 [I.V.:1000; IV Piggyback:200] Out: 340 [Urine:290; Blood:50] Intake/Output this shift: No intake/output data recorded.  No results for input(s): "HGB" in the last 72 hours. No results for input(s): "WBC", "RBC", "HCT", "PLT" in the last 72 hours. No results for input(s): "NA", "K", "CL", "CO2", "BUN", "CREATININE", "GLUCOSE", "CALCIUM" in the last 72 hours. No results for input(s): "LABPT", "INR" in the last 72 hours.  Neurologically intact ABD soft Neurovascular intact Sensation intact distally Intact pulses distally Dorsiflexion/Plantar flexion intact Incision: dressing C/D/I and no drainage No cellulitis present Compartment soft No sign of DVT   Assessment/Plan: 1 Day Post-Op Procedure(s) (LRB): Microlumbar decompression Lumbar four-five, lateral mass fusion with autograft and allograft bone (N/A) Advance diet Up with therapy D/C IV fluids Discussed D/C instructions, dressing instructions, Lspine precautions D/C home today   Cecilie Kicks 01/27/2022, 7:58 AM

## 2022-01-30 ENCOUNTER — Encounter (HOSPITAL_COMMUNITY): Payer: Self-pay

## 2022-01-30 ENCOUNTER — Emergency Department (HOSPITAL_COMMUNITY): Payer: Medicare HMO

## 2022-01-30 ENCOUNTER — Other Ambulatory Visit: Payer: Self-pay

## 2022-01-30 ENCOUNTER — Inpatient Hospital Stay (HOSPITAL_COMMUNITY)
Admission: EM | Admit: 2022-01-30 | Discharge: 2022-02-01 | DRG: 554 | Disposition: A | Discharge status: Home / Self Care | Payer: Medicare HMO | Attending: Internal Medicine | Admitting: Internal Medicine

## 2022-01-30 DIAGNOSIS — Z8711 Personal history of peptic ulcer disease: Secondary | ICD-10-CM

## 2022-01-30 DIAGNOSIS — Z96641 Presence of right artificial hip joint: Secondary | ICD-10-CM | POA: Diagnosis present

## 2022-01-30 DIAGNOSIS — I2721 Secondary pulmonary arterial hypertension: Secondary | ICD-10-CM | POA: Diagnosis present

## 2022-01-30 DIAGNOSIS — M009 Pyogenic arthritis, unspecified: Secondary | ICD-10-CM | POA: Diagnosis not present

## 2022-01-30 DIAGNOSIS — C7801 Secondary malignant neoplasm of right lung: Secondary | ICD-10-CM | POA: Diagnosis present

## 2022-01-30 DIAGNOSIS — E7800 Pure hypercholesterolemia, unspecified: Secondary | ICD-10-CM | POA: Diagnosis present

## 2022-01-30 DIAGNOSIS — Z885 Allergy status to narcotic agent status: Secondary | ICD-10-CM | POA: Diagnosis not present

## 2022-01-30 DIAGNOSIS — R918 Other nonspecific abnormal finding of lung field: Secondary | ICD-10-CM | POA: Diagnosis not present

## 2022-01-30 DIAGNOSIS — Z6831 Body mass index (BMI) 31.0-31.9, adult: Secondary | ICD-10-CM | POA: Diagnosis not present

## 2022-01-30 DIAGNOSIS — Z79899 Other long term (current) drug therapy: Secondary | ICD-10-CM | POA: Diagnosis not present

## 2022-01-30 DIAGNOSIS — C439 Malignant melanoma of skin, unspecified: Secondary | ICD-10-CM | POA: Diagnosis present

## 2022-01-30 DIAGNOSIS — E669 Obesity, unspecified: Secondary | ICD-10-CM | POA: Diagnosis present

## 2022-01-30 DIAGNOSIS — M2540 Effusion, unspecified joint: Secondary | ICD-10-CM

## 2022-01-30 DIAGNOSIS — M11221 Other chondrocalcinosis, right elbow: Principal | ICD-10-CM | POA: Diagnosis present

## 2022-01-30 DIAGNOSIS — I10 Essential (primary) hypertension: Secondary | ICD-10-CM | POA: Diagnosis present

## 2022-01-30 DIAGNOSIS — Z8673 Personal history of transient ischemic attack (TIA), and cerebral infarction without residual deficits: Secondary | ICD-10-CM

## 2022-01-30 DIAGNOSIS — R609 Edema, unspecified: Secondary | ICD-10-CM | POA: Diagnosis not present

## 2022-01-30 DIAGNOSIS — M25521 Pain in right elbow: Secondary | ICD-10-CM | POA: Diagnosis present

## 2022-01-30 DIAGNOSIS — M25531 Pain in right wrist: Secondary | ICD-10-CM | POA: Diagnosis not present

## 2022-01-30 LAB — CBC WITH DIFFERENTIAL/PLATELET
Abs Immature Granulocytes: 0.09 10*3/uL — ABNORMAL HIGH (ref 0.00–0.07)
Basophils Absolute: 0 10*3/uL (ref 0.0–0.1)
Basophils Relative: 0 %
Eosinophils Absolute: 0 10*3/uL (ref 0.0–0.5)
Eosinophils Relative: 0 %
HCT: 38.3 % — ABNORMAL LOW (ref 39.0–52.0)
Hemoglobin: 13.3 g/dL (ref 13.0–17.0)
Immature Granulocytes: 1 %
Lymphocytes Relative: 6 %
Lymphs Abs: 1.1 10*3/uL (ref 0.7–4.0)
MCH: 30.2 pg (ref 26.0–34.0)
MCHC: 34.7 g/dL (ref 30.0–36.0)
MCV: 87 fL (ref 80.0–100.0)
Monocytes Absolute: 2 10*3/uL — ABNORMAL HIGH (ref 0.1–1.0)
Monocytes Relative: 11 %
Neutro Abs: 15 10*3/uL — ABNORMAL HIGH (ref 1.7–7.7)
Neutrophils Relative %: 82 %
Platelets: 219 10*3/uL (ref 150–400)
RBC: 4.4 MIL/uL (ref 4.22–5.81)
RDW: 12.6 % (ref 11.5–15.5)
WBC: 18.3 10*3/uL — ABNORMAL HIGH (ref 4.0–10.5)
nRBC: 0 % (ref 0.0–0.2)

## 2022-01-30 LAB — URIC ACID: Uric Acid, Serum: 5.3 mg/dL (ref 3.7–8.6)

## 2022-01-30 LAB — SYNOVIAL CELL COUNT + DIFF, W/ CRYSTALS
Eosinophils-Synovial: 0 % (ref 0–1)
Lymphocytes-Synovial Fld: 0 % (ref 0–20)
Monocyte-Macrophage-Synovial Fluid: 10 % — ABNORMAL LOW (ref 50–90)
Neutrophil, Synovial: 90 % — ABNORMAL HIGH (ref 0–25)
WBC, Synovial: 17430 /mm3 — ABNORMAL HIGH (ref 0–200)

## 2022-01-30 LAB — URINALYSIS, ROUTINE W REFLEX MICROSCOPIC
Bacteria, UA: NONE SEEN
Bilirubin Urine: NEGATIVE
Glucose, UA: 50 mg/dL — AB
Hgb urine dipstick: NEGATIVE
Ketones, ur: 5 mg/dL — AB
Leukocytes,Ua: NEGATIVE
Nitrite: NEGATIVE
Protein, ur: 30 mg/dL — AB
Specific Gravity, Urine: 1.02 (ref 1.005–1.030)
pH: 5 (ref 5.0–8.0)

## 2022-01-30 LAB — CBC
HCT: 31.3 % — ABNORMAL LOW (ref 39.0–52.0)
Hemoglobin: 10.5 g/dL — ABNORMAL LOW (ref 13.0–17.0)
MCH: 30.3 pg (ref 26.0–34.0)
MCHC: 33.5 g/dL (ref 30.0–36.0)
MCV: 90.5 fL (ref 80.0–100.0)
Platelets: 183 10*3/uL (ref 150–400)
RBC: 3.46 MIL/uL — ABNORMAL LOW (ref 4.22–5.81)
RDW: 12.8 % (ref 11.5–15.5)
WBC: 14.5 10*3/uL — ABNORMAL HIGH (ref 4.0–10.5)
nRBC: 0 % (ref 0.0–0.2)

## 2022-01-30 LAB — COMPREHENSIVE METABOLIC PANEL
ALT: 14 U/L (ref 0–44)
AST: 16 U/L (ref 15–41)
Albumin: 3.4 g/dL — ABNORMAL LOW (ref 3.5–5.0)
Alkaline Phosphatase: 48 U/L (ref 38–126)
Anion gap: 9 (ref 5–15)
BUN: 19 mg/dL (ref 8–23)
CO2: 22 mmol/L (ref 22–32)
Calcium: 8.3 mg/dL — ABNORMAL LOW (ref 8.9–10.3)
Chloride: 100 mmol/L (ref 98–111)
Creatinine, Ser: 0.91 mg/dL (ref 0.61–1.24)
GFR, Estimated: >60 mL/min (ref >60)
Glucose, Bld: 138 mg/dL — ABNORMAL HIGH (ref 70–99)
Potassium: 3.9 mmol/L (ref 3.5–5.1)
Sodium: 131 mmol/L — ABNORMAL LOW (ref 135–145)
Total Bilirubin: 2 mg/dL — ABNORMAL HIGH (ref 0.3–1.2)
Total Protein: 6.6 g/dL (ref 6.5–8.1)

## 2022-01-30 LAB — C-REACTIVE PROTEIN: CRP: 17.2 mg/dL — ABNORMAL HIGH (ref <1.0)

## 2022-01-30 LAB — SEDIMENTATION RATE: Sed Rate: 33 mm/hr — ABNORMAL HIGH (ref 0–16)

## 2022-01-30 LAB — CREATININE, SERUM
Creatinine, Ser: 0.95 mg/dL (ref 0.61–1.24)
GFR, Estimated: >60 mL/min (ref >60)

## 2022-01-30 LAB — LACTIC ACID, PLASMA: Lactic Acid, Venous: 0.9 mmol/L (ref 0.5–1.9)

## 2022-01-30 MED ORDER — HYDROMORPHONE HCL 1 MG/ML IJ SOLN
1.0000 mg | Freq: Once | INTRAMUSCULAR | Status: AC
  Administered 2022-01-30: 1 mg via INTRAVENOUS
  Filled 2022-01-30: qty 1

## 2022-01-30 MED ORDER — SODIUM CHLORIDE (PF) 0.9 % IJ SOLN
INTRAMUSCULAR | Status: AC
  Filled 2022-01-30: qty 50

## 2022-01-30 MED ORDER — MELATONIN 5 MG PO TABS
5.0000 mg | ORAL_TABLET | Freq: Every evening | ORAL | Status: DC | PRN
Start: 2022-01-30 — End: 2022-02-01

## 2022-01-30 MED ORDER — ENOXAPARIN SODIUM 40 MG/0.4ML IJ SOSY
40.0000 mg | PREFILLED_SYRINGE | INTRAMUSCULAR | Status: DC
  Administered 2022-01-30 – 2022-01-31 (×2): 40 mg via SUBCUTANEOUS
  Filled 2022-01-30 (×2): qty 0.4

## 2022-01-30 MED ORDER — POLYETHYLENE GLYCOL 3350 17 G PO PACK: 17.0000 g | PACK | Freq: Every day | ORAL | Status: DC | PRN

## 2022-01-30 MED ORDER — ACETAMINOPHEN 325 MG PO TABS: 650.0000 mg | ORAL_TABLET | Freq: Four times a day (QID) | ORAL | Status: DC | PRN

## 2022-01-30 MED ORDER — SODIUM CHLORIDE 0.9 % IV SOLN
2.0000 g | INTRAVENOUS | Status: DC
  Administered 2022-01-30: 2 g via INTRAVENOUS
  Filled 2022-01-30: qty 20

## 2022-01-30 MED ORDER — OXYCODONE HCL 5 MG PO TABS
5.0000 mg | ORAL_TABLET | Freq: Four times a day (QID) | ORAL | Status: DC | PRN
  Administered 2022-02-01: 5 mg via ORAL
  Filled 2022-01-30: qty 1

## 2022-01-30 MED ORDER — PROCHLORPERAZINE EDISYLATE 10 MG/2ML IJ SOLN
10.0000 mg | Freq: Four times a day (QID) | INTRAMUSCULAR | Status: DC | PRN
Start: 2022-01-30 — End: 2022-02-01
  Administered 2022-02-01: 10 mg via INTRAVENOUS
  Filled 2022-01-30: qty 2

## 2022-01-30 MED ORDER — OXYCODONE-ACETAMINOPHEN 5-325 MG PO TABS
2.0000 | ORAL_TABLET | Freq: Once | ORAL | Status: AC
  Administered 2022-01-30: 2 via ORAL
  Filled 2022-01-30: qty 2

## 2022-01-30 MED ORDER — VANCOMYCIN HCL 1500 MG/300ML IV SOLN: 1500.0000 mg | INTRAVENOUS | Status: DC

## 2022-01-30 MED ORDER — VANCOMYCIN HCL 2000 MG/400ML IV SOLN
2000.0000 mg | Freq: Once | INTRAVENOUS | Status: AC
  Administered 2022-01-30: 2000 mg via INTRAVENOUS
  Filled 2022-01-30: qty 400

## 2022-01-30 MED ORDER — HYDROMORPHONE HCL 1 MG/ML IJ SOLN: 0.5000 mg | INTRAMUSCULAR | Status: DC | PRN

## 2022-01-30 MED ORDER — AMLODIPINE BESYLATE 5 MG PO TABS
5.0000 mg | ORAL_TABLET | Freq: Every day | ORAL | Status: DC
  Administered 2022-01-30 – 2022-02-01 (×3): 5 mg via ORAL
  Filled 2022-01-30 (×3): qty 1

## 2022-01-30 MED ORDER — LACTATED RINGERS IV SOLN: INTRAVENOUS | Status: DC

## 2022-01-30 MED ORDER — IOHEXOL 300 MG/ML  SOLN
100.0000 mL | Freq: Once | INTRAMUSCULAR | Status: AC | PRN
Start: 2022-01-30 — End: 2022-01-30
  Administered 2022-01-30: 100 mL via INTRAVENOUS

## 2022-01-30 MED ORDER — TRAZODONE HCL 50 MG PO TABS
50.0000 mg | ORAL_TABLET | Freq: Once | ORAL | Status: AC | PRN
  Administered 2022-01-31: 50 mg via ORAL
  Filled 2022-01-30: qty 1

## 2022-01-30 MED ORDER — HYDROMORPHONE HCL 1 MG/ML IJ SOLN
0.5000 mg | INTRAMUSCULAR | Status: AC
  Administered 2022-01-30: 0.5 mg via INTRAVENOUS
  Filled 2022-01-30: qty 1

## 2022-01-30 MED ORDER — ONDANSETRON HCL 4 MG/2ML IJ SOLN
4.0000 mg | Freq: Once | INTRAMUSCULAR | Status: AC
  Administered 2022-01-30: 4 mg via INTRAVENOUS
  Filled 2022-01-30: qty 2

## 2022-01-30 NOTE — Consult Note (Addendum)
ORTHOPAEDIC CONSULTATION  REQUESTING PHYSICIAN: Kayleen Memos, DO  Chief Complaint: right elbow and base of thumb swelling and pain  HPI: [name] is a 76 y.o. male with history of previous CVA, peptic ulcer disease, hypertension, hyperlipidemia with recent Microlumbar decompression L4-5 with Dr. Maxie Better on 6/8 who presented to the [facility] emergency department today with pain and redness at right elbow and base of right thumb.  He states right elbow and thumb pain started approximately 3 to 4 days ago and have slowly been worsening over that time.  He feels like swelling and redness is also worsened over that time.  Pain was severe on presentation to the ER, he states he continues to have severe pain with movement and palpation but this has improved some with pain medication.  Denies wrist pain.  He does not feel like pain from recent surgery has worsened significantly over the last few days.  Denies chest pain, shortness of breath, fevers, chills, nausea, or vomiting.  Denies paresthesias. Patient states he has no personal history or family history of gout, no known kidney issues.  Patient's family also states they recently received pathology report from his dermatologist stating that a rash they have been monitoring on his left medial calf returned positive for metastatic melanoma.   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 , 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    Past Surgical History:  Procedure Laterality Date   LUMBAR LAMINECTOMY/DECOMPRESSION MICRODISCECTOMY N/A 01/26/2022   Procedure: Microlumbar decompression Lumbar four-five, lateral mass fusion with autograft and allograft bone;  Surgeon: Susa Day, MD;  Location: [place];  Service: Orthopedics;  Laterality: N/A;   PARTIAL GASTRECTOMY     TOTAL  HIP ARTHROPLASTY Right 06/18/2018   Procedure: RIGHT TOTAL HIP ARTHROPLASTY ANTERIOR APPROACH;  Surgeon: [name], MD;  Location: [facility];  Service: Orthopedics;  Laterality: Right;   Social History   Socioeconomic History   Marital status: Married    Spouse name: Not on file   Number of children: Not on file   Years of education: Not on file   Highest education level: Not on file  Occupational History   Not on file  Tobacco Use   Smoking status: Former   Smokeless tobacco: Never   Tobacco comments:    quit 40 years ago   Vaping Use   Vaping Use: Never used  Substance and Sexual Activity   Alcohol use: Yes    Comment: moderate  ; 2-3 beers each weekend    Drug use: Not on file   Sexual activity: Not on file  Other Topics Concern   Not on file  Social History Narrative   Not on file   Social Determinants of Health   Financial Resource Strain: Not on file  Food Insecurity: Not on file  Transportation Needs: Not on file  Physical Activity: Not on file  Stress: Not on file  Social Connections: Not on file   History reviewed. No pertinent family history. Allergies  Allergen Reactions   Morphine Itching    Patient states it is tolerable itching     Positive ROS: All other systems have been reviewed and were otherwise negative with the exception of those mentioned in the HPI  and as above.  Physical Exam: General: Alert, no acute distress Cardiovascular: No pedal edema Respiratory: No cyanosis, no use of accessory musculature GI: No organomegaly, abdomen is soft and non-tender Skin: Erythema over olecranon process of right elbow no significant erythema at the base of the right thumb.  Edema at base of right thumb and right elbow. Neurologic: Sensation intact distally Psychiatric: Patient is competent for consent with normal mood and affect Lymphatic: No axillary or cervical lymphadenopathy  MUSCULOSKELETAL:  RUE: Patient locates pain to base of right thumb and  right elbow.  Significantly tender to palpation at [facility] joint and olecranon.  Moderate edema at both right elbow and base of right thumb.  Erythema over olecranon.  Locates pain to base of right thumb and right elbow. TTP to olecranon.  Tolerates 90 to 110 degrees of active range of motion at the right elbow though this does cause significant pain.  Able to flex and extend right thumb.  Sensation intact all fingers right hand.  Dorsiflexion and plantarflexion intact at bilateral ankles.  Patient endorses sensation of bilateral feet.  Feet warm and well-perfused.  Low back surgical dressing was removed, no significant erythema or drainage from the surgical incision, skin well opposed.  No significant edema around the surgical incision.  New sterile Mepilex was replaced over surgical incision.  Imaging: 3 view right wrist: Mild degenerative changes and are more prominent at the distal radial intercarpal and carpometacarpal joint with some narrowing of the joint space. Generally well maintained joint space at right thumb [facility]. No fractures or dislocations. 3 view right elbow - no fracture or dislocation, large joint effusion, mild degenerative changes at right elbow.    Assessment: Right elbow and base of right pain and swelling in patient who has recently undergone back surgery, septic arthritis vs. gout  Plan: We will plan for aspiration of right elbow today.  Patient is not locating his pain at the right wrist but more at the right thumb [facility] joint so we will forego a right wrist aspiration.  Joint fluid we will plan to be sent for culture, gram stain and analysis with crystals.   Okay to start IV antibiotics and recommend starting IV prednisone as well to treat empirically for gout.  I will also order him a right elbow posterior splint to help with pain control. We will follow tomorrow for results of aspiration. Dr. Mardelle Matte discussed case with [name], new surgical dressing was placed.    PRE-PROCEDURE DIAGNOSIS:  Right elbow effusion POST-OPERATIVE DIAGNOSIS:  Same PROCEDURE:  Aspiration / Intra-articular injection right elbow  PROCEDURE DETAILS: After informed verbal consent was obtained the lateral portal was cleaned with an alcohol prep pad. Next, a 20 mL syringe with an 18-gauge needle was used to aspirate approximately 5 ml of blood tinged, but otherwise normal appearing joint fluid. 1 ml of Depo-Medrol ('40mg'$ ) was then injected into the joint space. He tolerated this well without complication and a Band-Aid was placed.        Ventura Bruns, PA-C    01/30/2022 5:57 PM

## 2022-01-30 NOTE — Progress Notes (Signed)
Apparently IR has declined guided aspirations of elbow and wrist, indicating that they do not do these procedures.   Will attempt non-guided bedside aspirations.   Treat empirically for gout in the meantime.   Johnny Bridge, MD

## 2022-01-30 NOTE — ED Notes (Signed)
Pt ambulated approx 40 feet with walker. Tolerated well with O2 saturations maintained in 97-98%, HR reached 101 and returned to 98 at the end of walk.

## 2022-01-30 NOTE — Progress Notes (Signed)
Orthopedic Tech Progress Note Patient Details:  [name] 1945-09-15 [id number]  Ortho Devices Type of Ortho Device: Ace wrap, Sling immobilizer, Post (long arm) splint Ortho Device/Splint Location: right Ortho Device/Splint Interventions: Application   Post Interventions Patient Tolerated: Well Instructions Provided: Care of device, Adjustment of device  [name] 01/30/2022, 8:27 PM

## 2022-01-30 NOTE — Progress Notes (Signed)
Called from ER regarding right wrist and elbow.  Patient is status post recent lumbar decompression and fusion.  Also with new diagnosis of melanoma.  It is not totally clear whether or not he has a history of gout, according to the ER provider the patient denies any knowledge of gout.  Positive effusion on radiographs of the elbow, as well as wrist pain.  Plan to hold IV antibiotics until interventional radiology aspiration of the wrist and elbow, it would also be probably be of some benefit to inject Depo-Medrol or some type of cortisone at the time of aspiration of the joint.  Also recommend IV prednisone.  If the joints are concerning for sepsis then we may consider surgical washout, however this clinical scenario seems more consistent with gout.  Full consult to follow.  Marchia Bond, MD

## 2022-01-30 NOTE — H&P (Signed)
History and Physical  [name] CBU:[id number] DOB: November 13, 1945 DOA: 01/30/2022  Referring physician: Dr. Matilde Sprang, [place]  PCP: Consuello Closs, MD  Outpatient Specialists: Dermatology Patient coming from: Home.  Chief Complaint: Right wrist and right elbow pain and swelling  HPI: [name] is a 76 y.o. male with medical history significant for recently diagnosed metastatic melanoma from a rash located on his left medial calf, recent microlumbar decompression surgery in the lumbar spine 4 days ago, previous CVA, peptic ulcer disease, hypertension, hyperlipidemia who presented to [facility] ED with multiple complaints including right elbow and right wrist pain.  The pain has been progressive for the past 4 days.  No prior history of gout.    Contrast CT chest abdomen and pelvis was done for staging.  It revealed bilateral pulmonary nodules worrisome for metastatic disease, enlarged pulmonary trunk indicative of pulmonary arterial hypertension.    Due to concern for possible septic arthritis, EDP discussed the case with interventional radiology for possible aspiration of joint to rule out septic arthritis.  EDP also discussed the case with medical oncology due to new diagnosis of metastatic melanoma with mets to the lungs.  Antibiotics were held in anticipation for joint aspiration.  TRH, hospitalist service was asked to admit.  ED Course: Tmax 99.7.  BP 155/70, pulse 94, respiratory 18, O2 saturation 95% on room air.  Lab studies remarkable for serum sodium 131, glucose 138, albumin 3.4, total bilirubin 2.0.  CRP 17.2, lactic acid 0.9.  WBC 18.3, hemoglobin 13.3, neutrophil count 13.0, sed rate 33.  Review of Systems: Review of systems as noted in the HPI. All other systems reviewed and are negative.   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 , 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    Past Surgical History:  Procedure Laterality Date   LUMBAR LAMINECTOMY/DECOMPRESSION MICRODISCECTOMY N/A 01/26/2022   Procedure: Microlumbar decompression Lumbar four-five, lateral mass fusion with autograft and allograft bone;  Surgeon: Susa Day, MD;  Location: [facility];  Service: Orthopedics;  Laterality: N/A;   PARTIAL GASTRECTOMY     TOTAL HIP ARTHROPLASTY Right 06/18/2018   Procedure: RIGHT TOTAL HIP ARTHROPLASTY ANTERIOR APPROACH;  Surgeon: [name], MD;  Location: [facility];  Service: Orthopedics;  Laterality: Right;    Social History:  reports that he has quit smoking. He has never used smokeless tobacco. He reports current alcohol use. No history on file for drug use.   Allergies  Allergen Reactions   Morphine Itching    Patient states it is tolerable itching    Family history: Father alcoholic, deceased at the age of 44.  Prior to Admission medications   Medication Sig Start Date End Date Taking? Authorizing Provider  amLODipine (NORVASC) 5 MG tablet Take 5 mg by mouth daily.   Yes [provider]  docusate sodium (COLACE) 100 MG capsule Take 1 capsule (100 mg total) by mouth 2 (two) times daily as needed for mild constipation. 01/26/22  Yes Susa Day, MD  famotidine (PEPCID) 20 MG tablet Take 20 mg by mouth at bedtime as needed for heartburn or indigestion.   Yes [provider]  Flaxseed, Linseed, (FLAXSEED OIL) 1000 MG CAPS Take 1,000 mg by mouth daily.   Yes [provider]  folic acid (FOLVITE) 099 MCG tablet Take 400 mcg by mouth daily.   Yes [provider]  gabapentin (NEURONTIN) 100 MG capsule Take 300 mg by mouth 3 (three) times daily. 01/03/22  Yes [provider]  hydrochlorothiazide (HYDRODIURIL) 12.5 MG tablet Take 12.5 mg by mouth daily.   Yes [provider]  lisinopril (PRINIVIL,ZESTRIL) 20 MG tablet Take 20 mg by mouth daily.    Yes [provider]  Multiple Vitamin (MULTIVITAMIN WITH MINERALS) TABS tablet Take 1 tablet by mouth in the morning.   Yes [provider]  oxyCODONE (OXY IR/ROXICODONE) 5 MG immediate release tablet Take 1 tablet (5 mg total) by mouth every 4 (four) hours as needed for severe pain. 01/26/22  Yes Susa Day, MD  polyethylene glycol (MIRALAX / GLYCOLAX) 17 g packet Take 17 g by mouth daily. 01/26/22  Yes Susa Day, MD  psyllium (METAMUCIL) 58.6 % powder Take 1 packet by mouth daily as needed (constipation).   Yes [provider]  traZODone (DESYREL) 50 MG tablet Take 50 mg by mouth at bedtime as needed for sleep. 01/18/22  Yes [provider]    Physical Exam: BP (!) 155/70   Pulse 94   Temp 99.7 F (37.6 C) (Oral)   Resp 18   SpO2 93%   General: 76 y.o. year-old male well developed well nourished in no acute distress.  Alert and oriented x3. Cardiovascular: Regular rate and rhythm with no rubs or gallops.  No thyromegaly or JVD noted.  No lower extremity edema. 2/4 pulses in all 4 extremities. Respiratory: Clear to auscultation with no wheezes or rales. Good inspiratory effort. Abdomen: Soft nontender nondistended with normal bowel sounds x4 quadrants. Muskuloskeletal: No cyanosis or clubbing.  Left lower extremity edema.  Right wrist right elbow edema. Neuro: CN II-XII intact, strength, sensation, reflexes Skin: No ulcerative lesions noted or rashes.  Left lower extremity medial calf rash. Psychiatry: Judgement and insight appear normal. Mood is appropriate for condition and setting          Labs on Admission:  Basic Metabolic Panel: Recent Labs  Lab 01/27/22 0750 01/30/22 1042  NA 133* 131*  K 3.8 3.9  CL 102 100  CO2 25 22  GLUCOSE 128* 138*  BUN 23 19  CREATININE 1.07 0.91  CALCIUM 8.1* 8.3*   Liver Function Tests: Recent Labs  Lab 01/30/22 1042  AST 16  ALT 14  ALKPHOS 48  BILITOT 2.0*  PROT 6.6  ALBUMIN 3.4*   No  results for input(s): "LIPASE", "AMYLASE" in the last 168 hours. No results for input(s): "AMMONIA" in the last 168 hours. CBC: Recent Labs  Lab 01/27/22 0750 01/30/22 1042  WBC 21.3* 18.3*  NEUTROABS  --  15.0*  HGB 12.6* 13.3  HCT 36.6* 38.3*  MCV 88.6 87.0  PLT 220 219   Cardiac Enzymes: No results for input(s): "CKTOTAL", "CKMB", "CKMBINDEX", "TROPONINI" in the last 168 hours.  BNP (last 3 results) No results for input(s): "BNP" in the last 8760 hours.  ProBNP (last 3 results) No results for input(s): "PROBNP" in the last 8760 hours.  CBG: No results for input(s): "GLUCAP" in the last 168 hours.  Radiological Exams on Admission: DG Wrist Complete Right  Result Date: 01/30/2022 CLINICAL DATA:  Evaluate for osteomyelitis. History of pain and swelling at the wrist joint. EXAM: RIGHT WRIST - COMPLETE 3+ VIEW COMPARISON:  None Available. FINDINGS: There is no evidence of fracture or dislocation. There are mild degenerative changes seen and  are more prominent at the distal radial intercarpal and carpometacarpal joints. There is small bony excrescence seen at the radial basal aspect of the second metacarpal. Soft tissues are unremarkable. IMPRESSION: Mild degenerative changes and are more prominent at the distal radial intercarpal and carpometacarpal joint with some narrowing of the joint space Electronically Signed   By: Frazier Richards M.D.   On: 01/30/2022 16:26   CT CHEST ABDOMEN PELVIS W CONTRAST  Result Date: 01/30/2022 CLINICAL DATA:  Metastatic disease evaluation. * Tracking Code: BO * EXAM: CT CHEST, ABDOMEN, AND PELVIS WITH CONTRAST TECHNIQUE: Multidetector CT imaging of the chest, abdomen and pelvis was performed following the standard protocol during bolus administration of intravenous contrast. RADIATION DOSE REDUCTION: This exam was performed according to the departmental dose-optimization program which includes automated exposure control, adjustment of the mA and/or kV  according to patient size and/or use of iterative reconstruction technique. CONTRAST:  118m OMNIPAQUE IOHEXOL 300 MG/ML  SOLN COMPARISON:  CT chest 11/23/2004 and CT abdomen pelvis 07/07/2018. FINDINGS: CT CHEST FINDINGS Cardiovascular: Atherosclerotic calcification of the aorta, aortic valve and coronary arteries. Enlarged pulmonic trunk. Heart size normal. No pericardial effusion. Mediastinum/Nodes: No pathologically enlarged mediastinal, hilar or axillary lymph nodes. Esophagus is grossly unremarkable. Lungs/Pleura: There are at least 4 randomly distributed pulmonary nodules in the mid and lower lung zones, measuring up to 9 mm in the lateral segment right middle lobe (6/86). Scattered volume loss in the lung bases. No pleural fluid. Airway is unremarkable. Musculoskeletal: Degenerative changes in the spine. No worrisome lytic or sclerotic lesions. CT ABDOMEN PELVIS FINDINGS Hepatobiliary: Subcentimeter low-attenuation lesion in the dome of the liver is unchanged, indicative of a cyst. Liver and gallbladder are otherwise unremarkable. No biliary ductal dilatation. Pancreas: Negative. Spleen: Negative. Adrenals/Urinary Tract: Adrenal glands are unremarkable. Low-attenuation lesions in the kidneys measure up to 7.0 cm on the left and are indicative of cysts. No follow-up necessary. Ureters are decompressed. Bladder is largely obscured by streak artifact from a right hip arthroplasty. Stomach/Bowel: Partial gastrectomy. Stomach, small bowel, appendix and colon are otherwise unremarkable. Vascular/Lymphatic: Atherosclerotic calcification of the aorta. No pathologically enlarged lymph nodes. Reproductive: Prostate is normal in size. Other: No free fluid. Bilateral inguinal hernias contain fat. Mesenteries and peritoneum are otherwise unremarkable. Musculoskeletal: Right hip arthroplasty. Degenerative changes in the left hip and spine. Postoperative changes in the spine. No worrisome lytic or sclerotic lesions.  IMPRESSION: 1. Bilateral pulmonary nodules are worrisome for metastatic disease. A primary malignancy is not identified in the chest, abdomen or pelvis. 2. Aortic atherosclerosis (ICD10-I70.0). Coronary artery calcification. 3. Enlarged pulmonic trunk, indicative of pulmonary arterial hypertension. Electronically Signed   By: MLorin PicketM.D.   On: 01/30/2022 15:11   DG Elbow Complete Right  Result Date: 01/30/2022 CLINICAL DATA:  Right elbow pain and swelling, history of gout, recent back surgery EXAM: RIGHT ELBOW - COMPLETE 3+ VIEW COMPARISON:  None Available. FINDINGS: No displaced fracture or dislocation of the right elbow. Mild elbow joint arthrosis. Large elbow joint effusion. IMPRESSION: 1. No displaced fracture or dislocation of the right elbow. 2. Large elbow joint effusion. 3. Mild elbow joint arthrosis. No specific evidence of erosive arthropathy given reported history of gout. Electronically Signed   By: ADelanna AhmadiM.D.   On: 01/30/2022 14:48   DG Chest 2 View  Result Date: 01/30/2022 CLINICAL DATA:  Infection. EXAM: CHEST - 2 VIEW COMPARISON:  Chest x-ray November 04, 2008. FINDINGS: Streaky bibasilar subsegmental atelectasis and/or scar. No confluent consolidation. Possible trace  bilateral pleural effusions. No visible pneumothorax. Cardiomediastinal silhouette is within normal limits. IMPRESSION: 1. Subsegmental bibasilar atelectasis/scar. 2. Possible trace bilateral pleural effusions. Electronically Signed   By: Margaretha Sheffield M.D.   On: 01/30/2022 11:22    EKG: I independently viewed the EKG done and my findings are as followed: None available at the time of the visit.  Assessment/Plan Present on Admission:  Septic arthritis ([place])  Principal Problem:   Septic arthritis ([place])  Possible septic arthritis, POA Due to concern for septic arthritis, EDP consulted orthopedic surgery for aspiration of joint fluid.  EDP held antibiotics in anticipation for joint fluid  aspiration. After aspiration is done check for crystals and infection Obtain uric acid, need to rule out Gout Pain control Start antibiotics after joint fluid aspiration, Rocephin and IV vancomycin Gentle IV fluid hydration LR at 50 cc/h x 2 days  Newly diagnosed metastatic melanoma Left lower extremity medial calf rash, biopsied, results revealed metastatic melanoma Contrast CT chest abdomen pelvis done for staging May need CT head to rule out brain lesions or mets to the brain Dr. Earlie Server consulted by EDP  Recent back surgery POD #4 Analgesics as needed with bowel regimen  Essential hypertension BP not at goal elevated Resume home amlodipine Monitor vital signs    DVT prophylaxis: Subcu Lovenox daily  Code Status: Full code  Family Communication: Wife, daughter, son at bedside  Disposition Plan: Admitted to telemetry unit  Consults called: Orthopedic surgery and medical oncology consulted by EDP  Admission status: Inpatient status   Status is: Inpatient The patient requires at least 2 midnights for further evaluation and treatment of present condition.   Kayleen Memos MD [facility] Pager [phone]  If 7PM-7AM, please contact night-coverage [email or web address] Password [email or web address]  01/30/2022, 6:24 PM

## 2022-01-30 NOTE — ED Provider Notes (Signed)
[facility] DEPT Provider Note  CSN: [id number] Arrival date & time: 01/30/22 1016  Chief Complaint(s) Post-op Problem, Arm Swelling, and Leg Swelling  HPI Draysen Weygandt is a 76 y.o. male postop day 4 from a microlumbar decompression surgery in the lumbar spine, previous CVA, peptic ulcer disease, HTN, HLD who presents emergency department for evaluation of multiple complaints including right elbow pain and swelling, right wrist pain and swelling.  Patient states that the symptoms have been present for the last 3 to 4 days and have been significant worsening.  He states he is unable to extend the arm at the elbow.  He states his back pain is actually improving.  Of note, history obtained from patient's family who states that they received a pathology report from the dermatologist today indicating that a rash that they have been monitoring on his left medial calf that was biopsied by dermatology returned positive for metastatic melanoma.  Denies fever, chest pain, shortness of breath, headache or other systemic symptoms.   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 Authorizing Provider  amLODipine (NORVASC) 5 MG tablet Take 5 mg by mouth in the morning.    [provider]  docusate sodium (COLACE) 100 MG capsule Take 1 capsule (100 mg total) by mouth 2 (two) times daily as needed for mild constipation. 01/26/22   Susa Day, MD  famotidine (PEPCID) 20 MG tablet Take 20 mg  by mouth at bedtime as needed for heartburn or indigestion.    [provider]  Flaxseed, Linseed, (FLAXSEED OIL) 1000 MG CAPS Take 1,000 mg by mouth in the morning.    [provider]  folic acid (FOLVITE) 601 MCG tablet Take 400 mcg by mouth in the morning.    [provider]  gabapentin (NEURONTIN) 100 MG capsule Take 300 mg by mouth 3 (three) times daily. 01/03/22   [provider]  hydrochlorothiazide (HYDRODIURIL) 12.5 MG tablet Take 12.5 mg by mouth in the morning.    [provider]  lisinopril (PRINIVIL,ZESTRIL) 20 MG tablet Take 20 mg by mouth in the morning.    [provider]  Multiple Vitamin (MULTIVITAMIN WITH MINERALS) TABS tablet Take 1 tablet by mouth in the morning.    [provider]  oxyCODONE (OXY IR/ROXICODONE) 5 MG immediate release tablet Take 1 tablet (5 mg total) by mouth every 4 (four) hours as needed for severe pain. 01/26/22   Susa Day, MD  polyethylene glycol (MIRALAX / GLYCOLAX) 17 g packet Take 17 g by mouth daily as needed (constipation.).    [provider]  polyethylene glycol (MIRALAX / GLYCOLAX) 17 g packet Take 17 g by mouth daily. 01/26/22   Susa Day, MD  psyllium (METAMUCIL) 58.6 % powder Take 1 packet by mouth daily as needed (constipation).    [provider]  traZODone (DESYREL) 50 MG tablet Take 50 mg by mouth at bedtime  as needed. 01/18/22   [provider]                                                                                                                                    Past Surgical History Past Surgical History:  Procedure Laterality Date   LUMBAR LAMINECTOMY/DECOMPRESSION MICRODISCECTOMY N/A 01/26/2022   Procedure: Microlumbar decompression Lumbar four-five, lateral mass fusion with autograft and allograft bone;  Surgeon: Jene Every, MD;  Location: [facility] OR;  Service: Orthopedics;  Laterality: N/A;   PARTIAL GASTRECTOMY     TOTAL HIP  ARTHROPLASTY Right 06/18/2018   Procedure: RIGHT TOTAL HIP ARTHROPLASTY ANTERIOR APPROACH;  Surgeon: Durene Romans, MD;  Location: [facility] ORS;  Service: Orthopedics;  Laterality: Right;   Family History History reviewed. No pertinent family history.  Social History Social History   Tobacco Use   Smoking status: Former   Smokeless tobacco: Never   Tobacco comments:    quit 40 years ago   Vaping Use   Vaping Use: Never used  Substance Use Topics   Alcohol use: Yes    Comment: moderate  ; 2-3 beers each weekend    Allergies Morphine  Review of Systems Review of Systems  Musculoskeletal:  Positive for arthralgias, joint swelling and myalgias.  Skin:  Positive for rash.    Physical Exam Vital Signs  I have reviewed the triage vital signs BP (!) 144/68   Pulse 84   Temp 99.7 F (37.6 C) (Oral)   Resp 18   SpO2 96%   Physical Exam Vitals and nursing note reviewed.  Constitutional:      General: He is not in acute distress.    Appearance: He is well-developed.  HENT:     Head: Normocephalic and atraumatic.  Eyes:     Conjunctiva/sclera: Conjunctivae normal.  Cardiovascular:     Rate and Rhythm: Normal rate and regular rhythm.     Heart sounds: No murmur heard. Pulmonary:     Effort: Pulmonary effort is normal. No respiratory distress.     Breath sounds: Normal breath sounds.  Abdominal:     Palpations: Abdomen is soft.     Tenderness: There is no abdominal tenderness.  Musculoskeletal:        General: Swelling and tenderness present.     Cervical back: Neck supple.  Skin:    General: Skin is warm and dry.     Capillary Refill: Capillary refill takes less than 2 seconds.     Findings: Rash present.  Neurological:     Mental Status: He is alert.  Psychiatric:        Mood and Affect: Mood normal.     ED Results and Treatments Labs (all labs ordered are listed, but only abnormal results are displayed) Labs Reviewed  COMPREHENSIVE METABOLIC PANEL - Abnormal;  Notable for the following components:      Result Value   Sodium 131 (*)  Glucose, Bld 138 (*)    Calcium 8.3 (*)    Albumin 3.4 (*)    Total Bilirubin 2.0 (*)    All other components within normal limits  CBC WITH DIFFERENTIAL/PLATELET - Abnormal; Notable for the following components:   WBC 18.3 (*)    HCT 38.3 (*)    Neutro Abs 15.0 (*)    Monocytes Absolute 2.0 (*)    Abs Immature Granulocytes 0.09 (*)    All other components within normal limits  URINALYSIS, ROUTINE W REFLEX MICROSCOPIC - Abnormal; Notable for the following components:   Glucose, UA 50 (*)    Ketones, ur 5 (*)    Protein, ur 30 (*)    All other components within normal limits  LACTIC ACID, PLASMA  LACTIC ACID, PLASMA  SEDIMENTATION RATE  C-REACTIVE PROTEIN                                                                                                                          Radiology DG Elbow Complete Right  Result Date: 01/30/2022 CLINICAL DATA:  Right elbow pain and swelling, history of gout, recent back surgery EXAM: RIGHT ELBOW - COMPLETE 3+ VIEW COMPARISON:  None Available. FINDINGS: No displaced fracture or dislocation of the right elbow. Mild elbow joint arthrosis. Large elbow joint effusion. IMPRESSION: 1. No displaced fracture or dislocation of the right elbow. 2. Large elbow joint effusion. 3. Mild elbow joint arthrosis. No specific evidence of erosive arthropathy given reported history of gout. Electronically Signed   By: Delanna Ahmadi M.D.   On: 01/30/2022 14:48   DG Chest 2 View  Result Date: 01/30/2022 CLINICAL DATA:  Infection. EXAM: CHEST - 2 VIEW COMPARISON:  Chest x-ray November 04, 2008. FINDINGS: Streaky bibasilar subsegmental atelectasis and/or scar. No confluent consolidation. Possible trace bilateral pleural effusions. No visible pneumothorax. Cardiomediastinal silhouette is within normal limits. IMPRESSION: 1. Subsegmental bibasilar atelectasis/scar. 2. Possible trace bilateral pleural  effusions. Electronically Signed   By: Margaretha Sheffield M.D.   On: 01/30/2022 11:22    Pertinent labs & imaging results that were available during my care of the patient were reviewed by me and considered in my medical decision making (see MDM for details).  Medications Ordered in ED Medications  sodium chloride (PF) 0.9 % injection (has no administration in time range)  oxyCODONE-acetaminophen (PERCOCET/ROXICET) 5-325 MG per tablet 2 tablet (2 tablets Oral Given 01/30/22 1344)  iohexol (OMNIPAQUE) 300 MG/ML solution 100 mL (100 mLs Intravenous Contrast Given 01/30/22 1445)  Procedures Procedures  (including critical care time)  Medical Decision Making / ED Course   This patient presents to the ED for concern of elbow pain and swelling, wrist pain and swelling, rash, this involves an extensive number of treatment options, and is a complaint that carries with it a high risk of complications and morbidity.  The differential diagnosis includes septic joint, gout, pseudogout, metastatic disease  MDM: Patient seen emergency room for evaluation of multiple complaints described above.  Physical exam with warm to the touch, swollen right elbow and a similar presentation at the right wrist.  He has a biopsied erythematous rash on the left medial calf as well.  The incision site at the spine looks appropriately healing with no purulent drainage or significant surrounding erythema.  With leukocytosis to 18.3, ESR 33, CRP 17.2, normal lactate.  X-rays of the wrist and elbow with a large joint effusion at the right elbow but otherwise no obvious evidence of osteomyelitis.  Given patient's new suspected metastatic melanoma, CT chest abdomen pelvis was obtained that shows new suspicious nodules in the lung.  I consulted Dr. Mardelle Matte of orthopedics who requested a consultation with  interventional radiology for IR guided aspiration of the joint.  I spoke with Dr. Vivia Budge of interventional radiology who states that this is primarily a bedside orthopedic procedure and only very rarely does interventional radiology do this procedure.  He is requesting orthopedics to perform this procedure.  Patient then admitted to medicine for further work-up of multiple complaints described above.  I also spoke with Dr. Julien Nordmann of oncology who will round on the patient.   Additional history obtained: -Additional history obtained from multiple family members -External records from outside source obtained and reviewed including: Chart review including previous notes, labs, imaging, consultation notes   Lab Tests: -I ordered, reviewed, and interpreted labs.   The pertinent results include:   Labs Reviewed  COMPREHENSIVE METABOLIC PANEL - Abnormal; Notable for the following components:      Result Value   Sodium 131 (*)    Glucose, Bld 138 (*)    Calcium 8.3 (*)    Albumin 3.4 (*)    Total Bilirubin 2.0 (*)    All other components within normal limits  CBC WITH DIFFERENTIAL/PLATELET - Abnormal; Notable for the following components:   WBC 18.3 (*)    HCT 38.3 (*)    Neutro Abs 15.0 (*)    Monocytes Absolute 2.0 (*)    Abs Immature Granulocytes 0.09 (*)    All other components within normal limits  URINALYSIS, ROUTINE W REFLEX MICROSCOPIC - Abnormal; Notable for the following components:   Glucose, UA 50 (*)    Ketones, ur 5 (*)    Protein, ur 30 (*)    All other components within normal limits  LACTIC ACID, PLASMA  LACTIC ACID, PLASMA  SEDIMENTATION RATE  C-REACTIVE PROTEIN    Imaging Studies ordered: I ordered imaging studies including chest x-ray, right wrist x-ray, right elbow x-ray, CT chest and pelvis I independently visualized and interpreted imaging. I agree with the radiologist interpretation   Medicines ordered and prescription drug management: Meds  ordered this encounter  Medications   oxyCODONE-acetaminophen (PERCOCET/ROXICET) 5-325 MG per tablet 2 tablet   sodium chloride (PF) 0.9 % injection    [place], [place]: cabinet override   iohexol (OMNIPAQUE) 300 MG/ML solution 100 mL    -I have reviewed the patients home medicines and have made adjustments as needed  Critical interventions none  Consultations  Obtained: I requested consultation with the orthopedics, interventional radiology and oncology,  and discussed lab and imaging findings as well as pertinent plan - they recommend: Hospitalist admission   Cardiac Monitoring: The patient was maintained on a cardiac monitor.  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

## 2022-01-30 NOTE — ED Notes (Signed)
ED TO INPATIENT HANDOFF REPORT  Name/Age/Gender [name] 76 y.o. male  Code Status    Code Status Orders  (From admission, onward)           Start     Ordered   01/30/22 1644  Full code  Continuous        01/30/22 1644           Code Status History     Date Active Date Inactive Code Status Order ID Comments User Context   01/26/2022 1158 01/27/2022 1452 Full Code [id number]  Susa Day, MD Inpatient   06/18/2018 1400 06/19/2018 1851 Full Code [id number]  Danae Orleans, PA-C Inpatient      Advance Directive Documentation    Flowsheet Row Most Recent Value  Type of Advance Directive Healthcare Power of [place], Living will  Pre-existing out of facility DNR order (yellow form or pink MOST form) --  "MOST" Form in Place? --       Home/SNF/Other Home  Chief Complaint Septic arthritis ([place]) [M00.9]  Level of Care/Admitting Diagnosis ED Disposition     ED Disposition  Admit   Condition  --   [place]: [facility] [[id number]]  Level of Care: Telemetry [5]  Admit to tele based on following criteria: Monitor for Ischemic changes  May admit patient to [facility] or [facility] if equivalent level of care is available:: No  Covid Evaluation: Asymptomatic - no recent exposure (last 10 days) testing not required  Diagnosis: Septic arthritis [facility]) [[id number]]  Admitting Physician: Kayleen Memos [[id number]]  Attending Physician: Kayleen Memos [[id number]]  Estimated length of stay: past midnight tomorrow  Certification:: I certify this patient will need inpatient services for at least 2 midnights          Medical History Past Medical History:  Diagnosis Date   Arthritis    History of colon polyps    History of kidney stones    Hypercholesteremia    Hypertension    Peptic ulcer    Stroke ([place])    CVA, 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 , 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     Allergies Allergies  Allergen Reactions    Morphine Itching    Patient states it is tolerable itching    IV Location/Drains/Wounds Patient Lines/Drains/Airways Status     Active Line/Drains/Airways     Name Placement date Placement time Site Days   Peripheral IV 01/30/22 20 G Left Antecubital 01/30/22  1432  Antecubital  less than 1   Incision (Closed) 06/18/18 Hip Right 06/18/18  0952  -- 1322   Incision (Closed) 01/26/22 Back Other (Comment) 01/26/22  1054  -- 4            Labs/Imaging Results for orders placed or performed during the hospital encounter of 01/30/22 (from the past 48 hour(s))  Lactic acid, plasma     Status: None   Collection Time: 01/30/22 10:42 AM  Result Value Ref Range   Lactic Acid, Venous 0.9 0.5 - 1.9 mmol/L    Comment: Performed at [facility], [place] [address] [place], [place] [address]  Comprehensive metabolic panel     Status: Abnormal   Collection Time: 01/30/22 10:42 AM  Result Value Ref Range   Sodium 131 (L) 135 - 145 mmol/L   Potassium 3.9 3.5 - 5.1 mmol/L   Chloride 100 98 - 111 mmol/L   CO2 22 22 - 32 mmol/L   Glucose, Bld  138 (H) 70 - 99 mg/dL    Comment: Glucose reference range applies only to samples taken after fasting for at least 8 hours.   BUN 19 8 - 23 mg/dL   Creatinine, Ser 0.91 0.61 - 1.24 mg/dL   Calcium 8.3 (L) 8.9 - 10.3 mg/dL   Total Protein 6.6 6.5 - 8.1 g/dL   Albumin 3.4 (L) 3.5 - 5.0 g/dL   AST 16 15 - 41 U/L   ALT 14 0 - 44 U/L   Alkaline Phosphatase 48 38 - 126 U/L   Total Bilirubin 2.0 (H) 0.3 - 1.2 mg/dL   GFR, Estimated >60 >60 mL/min    Comment: (NOTE) Calculated using the CKD-EPI Creatinine Equation (2021)    Anion gap 9 5 - 15    Comment: Performed at [facility], [place] [address] [place], [place] [address]  CBC with Differential     Status: Abnormal   Collection Time: 01/30/22 10:42 AM  Result Value Ref Range   WBC 18.3 (H) 4.0 - 10.5 K/uL   RBC 4.40 4.22 - 5.81 MIL/uL   Hemoglobin 13.3 13.0 - 17.0  g/dL   HCT 38.3 (L) 39.0 - 52.0 %   MCV 87.0 80.0 - 100.0 fL   MCH 30.2 26.0 - 34.0 pg   MCHC 34.7 30.0 - 36.0 g/dL   RDW 12.6 11.5 - 15.5 %   Platelets 219 150 - 400 K/uL   nRBC 0.0 0.0 - 0.2 %   Neutrophils Relative % 82 %   Neutro Abs 15.0 (H) 1.7 - 7.7 K/uL   Lymphocytes Relative 6 %   Lymphs Abs 1.1 0.7 - 4.0 K/uL   Monocytes Relative 11 %   Monocytes Absolute 2.0 (H) 0.1 - 1.0 K/uL   Eosinophils Relative 0 %   Eosinophils Absolute 0.0 0.0 - 0.5 K/uL   Basophils Relative 0 %   Basophils Absolute 0.0 0.0 - 0.1 K/uL   Immature Granulocytes 1 %   Abs Immature Granulocytes 0.09 (H) 0.00 - 0.07 K/uL    Comment: Performed at [facility], [place] [address] [place], [place] [address]  Sedimentation rate     Status: Abnormal   Collection Time: 01/30/22 10:42 AM  Result Value Ref Range   Sed Rate 33 (H) 0 - 16 mm/hr    Comment: Performed at [facility], [place] [address], [place], [place] [address]  C-reactive protein     Status: Abnormal   Collection Time: 01/30/22 10:42 AM  Result Value Ref Range   CRP 17.2 (H) <1.0 mg/dL    Comment: Performed at [place] [address] [place],  [address]  Urinalysis, Routine w reflex microscopic Urine, Clean Catch     Status: Abnormal   Collection Time: 01/30/22 12:19 PM  Result Value Ref Range   Color, Urine YELLOW YELLOW   APPearance CLEAR CLEAR   Specific Gravity, Urine 1.020 1.005 - 1.030   pH 5.0 5.0 - 8.0   Glucose, UA 50 (A) NEGATIVE mg/dL   Hgb urine dipstick NEGATIVE NEGATIVE   Bilirubin Urine NEGATIVE NEGATIVE   Ketones, ur 5 (A) NEGATIVE mg/dL   Protein, ur 30 (A) NEGATIVE mg/dL   Nitrite NEGATIVE NEGATIVE   Leukocytes,Ua NEGATIVE NEGATIVE   RBC / HPF 0-5 0 - 5 RBC/hpf   WBC, UA 0-5 0 - 5 WBC/hpf   Bacteria, UA NONE SEEN NONE SEEN   Mucus PRESENT    Hyaline Casts, UA PRESENT  Comment: Performed at [facility], [place] [address] [place], [place]  [address]  CBC     Status: Abnormal   Collection Time: 01/30/22  3:18 PM  Result Value Ref Range   WBC 14.5 (H) 4.0 - 10.5 K/uL   RBC 3.46 (L) 4.22 - 5.81 MIL/uL   Hemoglobin 10.5 (L) 13.0 - 17.0 g/dL   HCT 31.3 (L) 39.0 - 52.0 %   MCV 90.5 80.0 - 100.0 fL   MCH 30.3 26.0 - 34.0 pg   MCHC 33.5 30.0 - 36.0 g/dL   RDW 12.8 11.5 - 15.5 %   Platelets 183 150 - 400 K/uL   nRBC 0.0 0.0 - 0.2 %    Comment: Performed at [facility], [place] [address], [place], [place] [address]  Creatinine, serum     Status: None   Collection Time: 01/30/22  3:18 PM  Result Value Ref Range   Creatinine, Ser 0.95 0.61 - 1.24 mg/dL   GFR, Estimated >60 >60 mL/min    Comment: (NOTE) Calculated using the CKD-EPI Creatinine Equation (2021) Performed at [facility], [place] [address], [place], [place] [address]   Uric acid     Status: None   Collection Time: 01/30/22  3:18 PM  Result Value Ref Range   Uric Acid, Serum 5.3 3.7 - 8.6 mg/dL    Comment: Performed at [facility], [place] [address], [place], [place] [address]  Synovial cell count + diff, w/ crystals     Status: Abnormal   Collection Time: 01/30/22  6:11 PM  Result Value Ref Range   Color, Synovial RED (A) YELLOW   Appearance-Synovial TURBID (A) CLEAR   Crystals, Fluid INTRACELLULAR CALCIUM PYROPHOSPHATE CRYSTALS    WBC, Synovial 17,430 (H) 0 - 200 /cu mm   Neutrophil, Synovial 90 (H) 0 - 25 %   Lymphocytes-Synovial Fld 0 0 - 20 %   Monocyte-Macrophage-Synovial Fluid 10 (L) 50 - 90 %   Eosinophils-Synovial 0 0 - 1 %    Comment: Performed at [place] [address], [place],  [address]   DG Wrist Complete Right  Result Date: 01/30/2022 CLINICAL DATA:  Evaluate for osteomyelitis. History of pain and swelling at the wrist joint. EXAM: RIGHT WRIST - COMPLETE 3+ VIEW COMPARISON:  None Available. FINDINGS: There is no evidence of fracture or dislocation. There are mild  degenerative changes seen and are more prominent at the distal radial intercarpal and carpometacarpal joints. There is small bony excrescence seen at the radial basal aspect of the second metacarpal. Soft tissues are unremarkable. IMPRESSION: Mild degenerative changes and are more prominent at the distal radial intercarpal and carpometacarpal joint with some narrowing of the joint space Electronically Signed   By: Frazier Richards M.D.   On: 01/30/2022 16:26   CT CHEST ABDOMEN PELVIS W CONTRAST  Result Date: 01/30/2022 CLINICAL DATA:  Metastatic disease evaluation. * Tracking Code: BO * EXAM: CT CHEST, ABDOMEN, AND PELVIS WITH CONTRAST TECHNIQUE: Multidetector CT imaging of the chest, abdomen and pelvis was performed following the standard protocol during bolus administration of intravenous contrast. RADIATION DOSE REDUCTION: This exam was performed according to the departmental dose-optimization program which includes automated exposure control, adjustment of the mA and/or kV according to patient size and/or use of iterative reconstruction technique. CONTRAST:  113m OMNIPAQUE IOHEXOL 300 MG/ML  SOLN COMPARISON:  CT chest 11/23/2004 and CT abdomen pelvis 07/07/2018. FINDINGS: CT CHEST FINDINGS Cardiovascular: Atherosclerotic calcification of the aorta,  aortic valve and coronary arteries. Enlarged pulmonic trunk. Heart size normal. No pericardial effusion. Mediastinum/Nodes: No pathologically enlarged mediastinal, hilar or axillary lymph nodes. Esophagus is grossly unremarkable. Lungs/Pleura: There are at least 4 randomly distributed pulmonary nodules in the mid and lower lung zones, measuring up to 9 mm in the lateral segment right middle lobe (6/86). Scattered volume loss in the lung bases. No pleural fluid. Airway is unremarkable. Musculoskeletal: Degenerative changes in the spine. No worrisome lytic or sclerotic lesions. CT ABDOMEN PELVIS FINDINGS Hepatobiliary: Subcentimeter low-attenuation lesion in the dome  of the liver is unchanged, indicative of a cyst. Liver and gallbladder are otherwise unremarkable. No biliary ductal dilatation. Pancreas: Negative. Spleen: Negative. Adrenals/Urinary Tract: Adrenal glands are unremarkable. Low-attenuation lesions in the kidneys measure up to 7.0 cm on the left and are indicative of cysts. No follow-up necessary. Ureters are decompressed. Bladder is largely obscured by streak artifact from a right hip arthroplasty. Stomach/Bowel: Partial gastrectomy. Stomach, small bowel, appendix and colon are otherwise unremarkable. Vascular/Lymphatic: Atherosclerotic calcification of the aorta. No pathologically enlarged lymph nodes. Reproductive: Prostate is normal in size. Other: No free fluid. Bilateral inguinal hernias contain fat. Mesenteries and peritoneum are otherwise unremarkable. Musculoskeletal: Right hip arthroplasty. Degenerative changes in the left hip and spine. Postoperative changes in the spine. No worrisome lytic or sclerotic lesions. IMPRESSION: 1. Bilateral pulmonary nodules are worrisome for metastatic disease. A primary malignancy is not identified in the chest, abdomen or pelvis. 2. Aortic atherosclerosis (ICD10-I70.0). Coronary artery calcification. 3. Enlarged pulmonic trunk, indicative of pulmonary arterial hypertension. Electronically Signed   By: Lorin Picket M.D.   On: 01/30/2022 15:11   DG Elbow Complete Right  Result Date: 01/30/2022 CLINICAL DATA:  Right elbow pain and swelling, history of gout, recent back surgery EXAM: RIGHT ELBOW - COMPLETE 3+ VIEW COMPARISON:  None Available. FINDINGS: No displaced fracture or dislocation of the right elbow. Mild elbow joint arthrosis. Large elbow joint effusion. IMPRESSION: 1. No displaced fracture or dislocation of the right elbow. 2. Large elbow joint effusion. 3. Mild elbow joint arthrosis. No specific evidence of erosive arthropathy given reported history of gout. Electronically Signed   By: Delanna Ahmadi M.D.   On:  01/30/2022 14:48   DG Chest 2 View  Result Date: 01/30/2022 CLINICAL DATA:  Infection. EXAM: CHEST - 2 VIEW COMPARISON:  Chest x-ray November 04, 2008. FINDINGS: Streaky bibasilar subsegmental atelectasis and/or scar. No confluent consolidation. Possible trace bilateral pleural effusions. No visible pneumothorax. Cardiomediastinal silhouette is within normal limits. IMPRESSION: 1. Subsegmental bibasilar atelectasis/scar. 2. Possible trace bilateral pleural effusions. Electronically Signed   By: Margaretha Sheffield M.D.   On: 01/30/2022 11:22    Pending Labs Unresulted Labs (From admission, onward)     Start     Ordered   02/06/22 0500  Creatinine, serum  (enoxaparin (LOVENOX)    CrCl >/= 30 ml/min)  Weekly,   R     Comments: while on enoxaparin therapy    01/30/22 1644   01/31/22 0500  CBC with Differential/Platelet  Tomorrow morning,   R        01/30/22 1853   01/31/22 0500  Comprehensive metabolic panel  Tomorrow morning,   R        01/30/22 1853   01/31/22 0500  Magnesium  Tomorrow morning,   R        01/30/22 1853   01/31/22 0500  Phosphorus  Tomorrow morning,   R        01/30/22 1853  01/30/22 1811  Body fluid culture w Gram Stain  Once,   R       Question:  Are there also cytology or pathology orders on this specimen?  Answer:  No   01/30/22 1810   01/30/22 1033  Lactic acid, plasma  Now then every 2 hours,   R (with STAT occurrences)      01/30/22 1032            Vitals/Pain Today's Vitals   01/30/22 1930 01/30/22 2016 01/30/22 2158 01/30/22 2200  BP: (!) 129/52   (!) 138/116  Pulse: 86   78  Resp: 19   (!) 21  Temp:  (!) 97.5 F (36.4 C)    TempSrc:  Oral    SpO2: 93%   93%  PainSc:   0-No pain     Isolation Precautions No active isolations  Medications Medications  enoxaparin (LOVENOX) injection 40 mg (40 mg Subcutaneous Given 01/30/22 2157)  acetaminophen (TYLENOL) tablet 650 mg (has no administration in time range)  oxyCODONE (Oxy IR/ROXICODONE) immediate  release tablet 5 mg (has no administration in time range)  polyethylene glycol (MIRALAX / GLYCOLAX) packet 17 g (has no administration in time range)  HYDROmorphone (DILAUDID) injection 0.5 mg (has no administration in time range)  amLODipine (NORVASC) tablet 5 mg (5 mg Oral Given 01/30/22 1927)  lactated ringers infusion ( Intravenous New Bag/Given 01/30/22 2009)  cefTRIAXone (ROCEPHIN) 2 g in sodium chloride 0.9 % 100 mL IVPB (0 g Intravenous Stopped 01/30/22 2009)  melatonin tablet 5 mg (has no administration in time range)  prochlorperazine (COMPAZINE) injection 10 mg (has no administration in time range)  vancomycin (VANCOREADY) IVPB 1500 mg/300 mL (has no administration in time range)  oxyCODONE-acetaminophen (PERCOCET/ROXICET) 5-325 MG per tablet 2 tablet (2 tablets Oral Given 01/30/22 1344)  sodium chloride (PF) 0.9 % injection (  Given 01/30/22 1611)  iohexol (OMNIPAQUE) 300 MG/ML solution 100 mL (100 mLs Intravenous Contrast Given 01/30/22 1445)  HYDROmorphone (DILAUDID) injection 1 mg (1 mg Intravenous Given 01/30/22 1633)  ondansetron (ZOFRAN) injection 4 mg (4 mg Intravenous Given 01/30/22 1633)  HYDROmorphone (DILAUDID) injection 0.5 mg (0.5 mg Intravenous Given 01/30/22 1711)  vancomycin (VANCOREADY) IVPB 2000 mg/400 mL (2,000 mg Intravenous New Bag/Given 01/30/22 2011)    Mobility walks with device

## 2022-01-30 NOTE — ED Triage Notes (Signed)
Pt BIB EMS from home. Pt reports surgery about a week ago at [facility] for back. Area around surgery site is hot to touch. Pt also endorses right elbow and wrist pain and swelling. Pt reports could be gout. Pt also has left leg swelling.

## 2022-01-30 NOTE — Progress Notes (Signed)
Pharmacy Antibiotic Note  [name] is a 76 y.o. male admitted on 01/30/2022 with  right elbow and base of thumb swelling and pain .  Pharmacy has been consulted for vancomycin dosing for possible septic arthritis. Ortho consulted - aspiration of joint done on 6/12 and sent for culture. Ceftriaxone + vancomycin started.   Today, 01/30/22 WBC slightly elevated CRP and Sed rate both elevated  Plan: Vancomycin 2000 mg IV LD followed by 1500 mg IV q24h maintenance dose for estimated AUC of 473 Goal vancomycin AUC 400-550. Check levels at steady state as needed Monitor culture data, renal function    Temp (24hrs), Avg:99.7 F (37.6 C), Min:99.7 F (37.6 C), Max:99.7 F (37.6 C)  Recent Labs  Lab 01/27/22 0750 01/30/22 1042 01/30/22 1518  WBC 21.3* 18.3* 14.5*  CREATININE 1.07 0.91 0.95  LATICACIDVEN  --  0.9  --     Estimated Creatinine Clearance: 82.6 mL/min (by C-G formula based on SCr of 0.95 mg/dL).    Allergies  Allergen Reactions   Morphine Itching    Patient states it is tolerable itching    Antimicrobials this admission: vancomycin 6/12 >>  ceftriaxone 6/12 >>   Dose adjustments this admission:  Microbiology results: 6/12 Joint Aspirate: Sent  Lenis Noon, PharmD 01/30/2022 7:19 PM

## 2022-01-31 ENCOUNTER — Inpatient Hospital Stay (HOSPITAL_COMMUNITY): Payer: Medicare HMO

## 2022-01-31 ENCOUNTER — Encounter (HOSPITAL_COMMUNITY): Payer: Self-pay | Admitting: Internal Medicine

## 2022-01-31 ENCOUNTER — Other Ambulatory Visit: Payer: Self-pay | Admitting: Oncology

## 2022-01-31 ENCOUNTER — Telehealth: Payer: Self-pay | Admitting: Oncology

## 2022-01-31 DIAGNOSIS — M25531 Pain in right wrist: Secondary | ICD-10-CM

## 2022-01-31 DIAGNOSIS — Z8711 Personal history of peptic ulcer disease: Secondary | ICD-10-CM

## 2022-01-31 DIAGNOSIS — M009 Pyogenic arthritis, unspecified: Secondary | ICD-10-CM | POA: Diagnosis not present

## 2022-01-31 DIAGNOSIS — R918 Other nonspecific abnormal finding of lung field: Secondary | ICD-10-CM

## 2022-01-31 DIAGNOSIS — M25521 Pain in right elbow: Secondary | ICD-10-CM

## 2022-01-31 DIAGNOSIS — Z79899 Other long term (current) drug therapy: Secondary | ICD-10-CM

## 2022-01-31 DIAGNOSIS — C439 Malignant melanoma of skin, unspecified: Secondary | ICD-10-CM

## 2022-01-31 DIAGNOSIS — Z8673 Personal history of transient ischemic attack (TIA), and cerebral infarction without residual deficits: Secondary | ICD-10-CM

## 2022-01-31 DIAGNOSIS — I10 Essential (primary) hypertension: Secondary | ICD-10-CM

## 2022-01-31 DIAGNOSIS — R609 Edema, unspecified: Secondary | ICD-10-CM

## 2022-01-31 LAB — CBC WITH DIFFERENTIAL/PLATELET
Abs Immature Granulocytes: 0.06 10*3/uL (ref 0.00–0.07)
Basophils Absolute: 0 10*3/uL (ref 0.0–0.1)
Basophils Relative: 0 %
Eosinophils Absolute: 0 10*3/uL (ref 0.0–0.5)
Eosinophils Relative: 0 %
HCT: 35.6 % — ABNORMAL LOW (ref 39.0–52.0)
Hemoglobin: 12.1 g/dL — ABNORMAL LOW (ref 13.0–17.0)
Immature Granulocytes: 0 %
Lymphocytes Relative: 8 %
Lymphs Abs: 1.1 10*3/uL (ref 0.7–4.0)
MCH: 30 pg (ref 26.0–34.0)
MCHC: 34 g/dL (ref 30.0–36.0)
MCV: 88.1 fL (ref 80.0–100.0)
Monocytes Absolute: 1.8 10*3/uL — ABNORMAL HIGH (ref 0.1–1.0)
Monocytes Relative: 12 %
Neutro Abs: 11.9 10*3/uL — ABNORMAL HIGH (ref 1.7–7.7)
Neutrophils Relative %: 80 %
Platelets: 218 10*3/uL (ref 150–400)
RBC: 4.04 MIL/uL — ABNORMAL LOW (ref 4.22–5.81)
RDW: 12.5 % (ref 11.5–15.5)
WBC: 14.9 10*3/uL — ABNORMAL HIGH (ref 4.0–10.5)
nRBC: 0 % (ref 0.0–0.2)

## 2022-01-31 LAB — COMPREHENSIVE METABOLIC PANEL
ALT: 14 U/L (ref 0–44)
AST: 15 U/L (ref 15–41)
Albumin: 3 g/dL — ABNORMAL LOW (ref 3.5–5.0)
Alkaline Phosphatase: 48 U/L (ref 38–126)
Anion gap: 6 (ref 5–15)
BUN: 20 mg/dL (ref 8–23)
CO2: 25 mmol/L (ref 22–32)
Calcium: 8.5 mg/dL — ABNORMAL LOW (ref 8.9–10.3)
Chloride: 107 mmol/L (ref 98–111)
Creatinine, Ser: 0.82 mg/dL (ref 0.61–1.24)
GFR, Estimated: >60 mL/min (ref >60)
Glucose, Bld: 138 mg/dL — ABNORMAL HIGH (ref 70–99)
Potassium: 4.5 mmol/L (ref 3.5–5.1)
Sodium: 138 mmol/L (ref 135–145)
Total Bilirubin: 1.4 mg/dL — ABNORMAL HIGH (ref 0.3–1.2)
Total Protein: 6 g/dL — ABNORMAL LOW (ref 6.5–8.1)

## 2022-01-31 LAB — PHOSPHORUS: Phosphorus: 3.9 mg/dL (ref 2.5–4.6)

## 2022-01-31 LAB — MAGNESIUM: Magnesium: 2.4 mg/dL (ref 1.7–2.4)

## 2022-01-31 MED ORDER — VANCOMYCIN HCL 1500 MG/300ML IV SOLN
1500.0000 mg | INTRAVENOUS | Status: DC
  Administered 2022-01-31: 1500 mg via INTRAVENOUS
  Filled 2022-01-31 (×2): qty 300

## 2022-01-31 MED ORDER — TRAZODONE HCL 50 MG PO TABS
50.0000 mg | ORAL_TABLET | Freq: Every day | ORAL | Status: DC
  Administered 2022-01-31: 50 mg via ORAL
  Filled 2022-01-31: qty 1

## 2022-01-31 MED ORDER — LORAZEPAM 2 MG/ML IJ SOLN
0.5000 mg | INTRAMUSCULAR | Status: AC
Start: 2022-01-31 — End: 2022-01-31
  Administered 2022-01-31: 0.5 mg via INTRAVENOUS
  Filled 2022-01-31: qty 1

## 2022-01-31 MED ORDER — GADOBUTROL 1 MMOL/ML IV SOLN
10.0000 mL | Freq: Once | INTRAVENOUS | Status: AC | PRN
Start: 2022-01-31 — End: 2022-01-31
  Administered 2022-01-31: 10 mL via INTRAVENOUS

## 2022-01-31 MED ORDER — SODIUM CHLORIDE 0.9 % IV SOLN
2.0000 g | INTRAVENOUS | Status: DC
  Administered 2022-01-31: 2 g via INTRAVENOUS
  Filled 2022-01-31: qty 20

## 2022-01-31 MED ORDER — METHYLPREDNISOLONE SODIUM SUCC 40 MG IJ SOLR
40.0000 mg | INTRAMUSCULAR | Status: DC
  Administered 2022-01-31 – 2022-02-01 (×2): 40 mg via INTRAVENOUS
  Filled 2022-01-31 (×2): qty 1

## 2022-01-31 MED ORDER — ENSURE MAX PROTEIN PO LIQD
11.0000 [oz_av] | Freq: Every day | ORAL | Status: DC
  Administered 2022-01-31: 11 [oz_av] via ORAL
  Filled 2022-01-31 (×3): qty 330

## 2022-01-31 NOTE — Telephone Encounter (Signed)
Scheduled appt per 6/13 referral. Pt's wife is aware of appt date and time. Pt's wife is aware to arrive 15 mins prior to appt time and to bring and updated insurance card. Pt's wife is aware of appt location.

## 2022-01-31 NOTE — Progress Notes (Addendum)
Subjective:    Patient is alert, oriented. Status he had a little nausea overnight but no vomiting. Reports elbow pain to be slightly improved, right thumb pain is about the same. Continued post-op back pain that is moderate, no change overnight . No other complaints.   Objective:  PE: VITALS:   Vitals:   01/30/22 2230 01/30/22 2255 01/30/22 2300 01/30/22 2335  BP: (!) 150/75  104/68 (!) 151/70  Pulse: 79   83  Resp: (!) 23   20  Temp:  99.2 F (37.3 C)  98.5 F (36.9 C)  TempSrc:  Oral    SpO2: 93%   98%   General: sitting up in bed, in no acute distress GI: soft, nontender MSK:  RUE: Moderately tender to palpation at [facility] joint and olecranon, olecranon TTP improved from exam yesterday. Erythema over olecranon has improved  Tolerates 70 to 110 degrees of active range of motion at the right elbow, improved from yesterday.  Able to flex and extend right thumb.  Sensation intact all fingers right hand.   Dorsiflexion and plantarflexion intact at bilateral ankles.  Patient endorses sensation of bilateral feet.  Feet warm and well-perfused.  Low back surgical dressing in place without significant drainage.   LABS  Results for orders placed or performed during the hospital encounter of 01/30/22 (from the past 24 hour(s))  Lactic acid, plasma     Status: None   Collection Time: 01/30/22 10:42 AM  Result Value Ref Range   Lactic Acid, Venous 0.9 0.5 - 1.9 mmol/L  Comprehensive metabolic panel     Status: Abnormal   Collection Time: 01/30/22 10:42 AM  Result Value Ref Range   Sodium 131 (L) 135 - 145 mmol/L   Potassium 3.9 3.5 - 5.1 mmol/L   Chloride 100 98 - 111 mmol/L   CO2 22 22 - 32 mmol/L   Glucose, Bld 138 (H) 70 - 99 mg/dL   BUN 19 8 - 23 mg/dL   Creatinine, Ser 0.91 0.61 - 1.24 mg/dL   Calcium 8.3 (L) 8.9 - 10.3 mg/dL   Total Protein 6.6 6.5 - 8.1 g/dL   Albumin 3.4 (L) 3.5 - 5.0 g/dL   AST 16 15 - 41 U/L   ALT 14 0 - 44 U/L   Alkaline Phosphatase 48 38 - 126  U/L   Total Bilirubin 2.0 (H) 0.3 - 1.2 mg/dL   GFR, Estimated >60 >60 mL/min   Anion gap 9 5 - 15  CBC with Differential     Status: Abnormal   Collection Time: 01/30/22 10:42 AM  Result Value Ref Range   WBC 18.3 (H) 4.0 - 10.5 K/uL   RBC 4.40 4.22 - 5.81 MIL/uL   Hemoglobin 13.3 13.0 - 17.0 g/dL   HCT 38.3 (L) 39.0 - 52.0 %   MCV 87.0 80.0 - 100.0 fL   MCH 30.2 26.0 - 34.0 pg   MCHC 34.7 30.0 - 36.0 g/dL   RDW 12.6 11.5 - 15.5 %   Platelets 219 150 - 400 K/uL   nRBC 0.0 0.0 - 0.2 %   Neutrophils Relative % 82 %   Neutro Abs 15.0 (H) 1.7 - 7.7 K/uL   Lymphocytes Relative 6 %   Lymphs Abs 1.1 0.7 - 4.0 K/uL   Monocytes Relative 11 %   Monocytes Absolute 2.0 (H) 0.1 - 1.0 K/uL   Eosinophils Relative 0 %   Eosinophils Absolute 0.0 0.0 - 0.5 K/uL   Basophils Relative 0 %  Basophils Absolute 0.0 0.0 - 0.1 K/uL   Immature Granulocytes 1 %   Abs Immature Granulocytes 0.09 (H) 0.00 - 0.07 K/uL  Sedimentation rate     Status: Abnormal   Collection Time: 01/30/22 10:42 AM  Result Value Ref Range   Sed Rate 33 (H) 0 - 16 mm/hr  C-reactive protein     Status: Abnormal   Collection Time: 01/30/22 10:42 AM  Result Value Ref Range   CRP 17.2 (H) <1.0 mg/dL  Urinalysis, Routine w reflex microscopic Urine, Clean Catch     Status: Abnormal   Collection Time: 01/30/22 12:19 PM  Result Value Ref Range   Color, Urine YELLOW YELLOW   APPearance CLEAR CLEAR   Specific Gravity, Urine 1.020 1.005 - 1.030   pH 5.0 5.0 - 8.0   Glucose, UA 50 (A) NEGATIVE mg/dL   Hgb urine dipstick NEGATIVE NEGATIVE   Bilirubin Urine NEGATIVE NEGATIVE   Ketones, ur 5 (A) NEGATIVE mg/dL   Protein, ur 30 (A) NEGATIVE mg/dL   Nitrite NEGATIVE NEGATIVE   Leukocytes,Ua NEGATIVE NEGATIVE   RBC / HPF 0-5 0 - 5 RBC/hpf   WBC, UA 0-5 0 - 5 WBC/hpf   Bacteria, UA NONE SEEN NONE SEEN   Mucus PRESENT    Hyaline Casts, UA PRESENT   CBC     Status: Abnormal   Collection Time: 01/30/22  3:18 PM  Result Value Ref  Range   WBC 14.5 (H) 4.0 - 10.5 K/uL   RBC 3.46 (L) 4.22 - 5.81 MIL/uL   Hemoglobin 10.5 (L) 13.0 - 17.0 g/dL   HCT 31.3 (L) 39.0 - 52.0 %   MCV 90.5 80.0 - 100.0 fL   MCH 30.3 26.0 - 34.0 pg   MCHC 33.5 30.0 - 36.0 g/dL   RDW 12.8 11.5 - 15.5 %   Platelets 183 150 - 400 K/uL   nRBC 0.0 0.0 - 0.2 %  Creatinine, serum     Status: None   Collection Time: 01/30/22  3:18 PM  Result Value Ref Range   Creatinine, Ser 0.95 0.61 - 1.24 mg/dL   GFR, Estimated >60 >60 mL/min  Uric acid     Status: None   Collection Time: 01/30/22  3:18 PM  Result Value Ref Range   Uric Acid, Serum 5.3 3.7 - 8.6 mg/dL  Body fluid culture w Gram Stain     Status: None (Preliminary result)   Collection Time: 01/30/22  6:11 PM   Specimen: Synovium; Body Fluid  Result Value Ref Range   Specimen Description      SYNOVIAL Performed at [facility] [address], [place], [place] [address]    Special Requests      NONE Performed at [facility], [place] [address], [place], [place] [address]    Gram Stain      MODERATE WBC PRESENT, PREDOMINANTLY PMN NO ORGANISMS SEEN Performed at [facility], [place] [address], [place], [place] [address]    Culture PENDING    Report Status PENDING   Synovial cell count + diff, w/ crystals     Status: Abnormal   Collection Time: 01/30/22  6:11 PM  Result Value Ref Range   Color, Synovial RED (A) YELLOW   Appearance-Synovial TURBID (A) CLEAR   Crystals, Fluid INTRACELLULAR CALCIUM PYROPHOSPHATE CRYSTALS    WBC, Synovial 17,430 (H) 0 - 200 /cu mm   Neutrophil, Synovial 90 (H) 0 - 25 %   Lymphocytes-Synovial Fld 0 0 -  20 %   Monocyte-Macrophage-Synovial Fluid 10 (L) 50 - 90 %   Eosinophils-Synovial 0 0 - 1 %  CBC with Differential/Platelet     Status: Abnormal   Collection Time: 01/31/22  5:07 AM  Result Value Ref Range   WBC 14.9 (H) 4.0 - 10.5 K/uL   RBC 4.04 (L) 4.22 - 5.81 MIL/uL   Hemoglobin 12.1 (L) 13.0 - 17.0 g/dL    HCT 35.6 (L) 39.0 - 52.0 %   MCV 88.1 80.0 - 100.0 fL   MCH 30.0 26.0 - 34.0 pg   MCHC 34.0 30.0 - 36.0 g/dL   RDW 12.5 11.5 - 15.5 %   Platelets 218 150 - 400 K/uL   nRBC 0.0 0.0 - 0.2 %   Neutrophils Relative % 80 %   Neutro Abs 11.9 (H) 1.7 - 7.7 K/uL   Lymphocytes Relative 8 %   Lymphs Abs 1.1 0.7 - 4.0 K/uL   Monocytes Relative 12 %   Monocytes Absolute 1.8 (H) 0.1 - 1.0 K/uL   Eosinophils Relative 0 %   Eosinophils Absolute 0.0 0.0 - 0.5 K/uL   Basophils Relative 0 %   Basophils Absolute 0.0 0.0 - 0.1 K/uL   Immature Granulocytes 0 %   Abs Immature Granulocytes 0.06 0.00 - 0.07 K/uL  Comprehensive metabolic panel     Status: Abnormal   Collection Time: 01/31/22  5:07 AM  Result Value Ref Range   Sodium 138 135 - 145 mmol/L   Potassium 4.5 3.5 - 5.1 mmol/L   Chloride 107 98 - 111 mmol/L   CO2 25 22 - 32 mmol/L   Glucose, Bld 138 (H) 70 - 99 mg/dL   BUN 20 8 - 23 mg/dL   Creatinine, Ser 0.82 0.61 - 1.24 mg/dL   Calcium 8.5 (L) 8.9 - 10.3 mg/dL   Total Protein 6.0 (L) 6.5 - 8.1 g/dL   Albumin 3.0 (L) 3.5 - 5.0 g/dL   AST 15 15 - 41 U/L   ALT 14 0 - 44 U/L   Alkaline Phosphatase 48 38 - 126 U/L   Total Bilirubin 1.4 (H) 0.3 - 1.2 mg/dL   GFR, Estimated >60 >60 mL/min   Anion gap 6 5 - 15  Magnesium     Status: None   Collection Time: 01/31/22  5:07 AM  Result Value Ref Range   Magnesium 2.4 1.7 - 2.4 mg/dL  Phosphorus     Status: None   Collection Time: 01/31/22  5:07 AM  Result Value Ref Range   Phosphorus 3.9 2.5 - 4.6 mg/dL    DG Wrist Complete Right  Result Date: 01/30/2022 CLINICAL DATA:  Evaluate for osteomyelitis. History of pain and swelling at the wrist joint. EXAM: RIGHT WRIST - COMPLETE 3+ VIEW COMPARISON:  None Available. FINDINGS: There is no evidence of fracture or dislocation. There are mild degenerative changes seen and are more prominent at the distal radial intercarpal and carpometacarpal joints. There is small bony excrescence seen at the radial  basal aspect of the second metacarpal. Soft tissues are unremarkable. IMPRESSION: Mild degenerative changes and are more prominent at the distal radial intercarpal and carpometacarpal joint with some narrowing of the joint space Electronically Signed   By: Frazier Richards M.D.   On: 01/30/2022 16:26   CT CHEST ABDOMEN PELVIS W CONTRAST  Result Date: 01/30/2022 CLINICAL DATA:  Metastatic disease evaluation. * Tracking Code: BO * EXAM: CT CHEST, ABDOMEN, AND PELVIS WITH CONTRAST TECHNIQUE: Multidetector CT imaging of the chest, abdomen  and pelvis was performed following the standard protocol during bolus administration of intravenous contrast. RADIATION DOSE REDUCTION: This exam was performed according to the departmental dose-optimization program which includes automated exposure control, adjustment of the mA and/or kV according to patient size and/or use of iterative reconstruction technique. CONTRAST:  18m OMNIPAQUE IOHEXOL 300 MG/ML  SOLN COMPARISON:  CT chest 11/23/2004 and CT abdomen pelvis 07/07/2018. FINDINGS: CT CHEST FINDINGS Cardiovascular: Atherosclerotic calcification of the aorta, aortic valve and coronary arteries. Enlarged pulmonic trunk. Heart size normal. No pericardial effusion. Mediastinum/Nodes: No pathologically enlarged mediastinal, hilar or axillary lymph nodes. Esophagus is grossly unremarkable. Lungs/Pleura: There are at least 4 randomly distributed pulmonary nodules in the mid and lower lung zones, measuring up to 9 mm in the lateral segment right middle lobe (6/86). Scattered volume loss in the lung bases. No pleural fluid. Airway is unremarkable. Musculoskeletal: Degenerative changes in the spine. No worrisome lytic or sclerotic lesions. CT ABDOMEN PELVIS FINDINGS Hepatobiliary: Subcentimeter low-attenuation lesion in the dome of the liver is unchanged, indicative of a cyst. Liver and gallbladder are otherwise unremarkable. No biliary ductal dilatation. Pancreas: Negative. Spleen:  Negative. Adrenals/Urinary Tract: Adrenal glands are unremarkable. Low-attenuation lesions in the kidneys measure up to 7.0 cm on the left and are indicative of cysts. No follow-up necessary. Ureters are decompressed. Bladder is largely obscured by streak artifact from a right hip arthroplasty. Stomach/Bowel: Partial gastrectomy. Stomach, small bowel, appendix and colon are otherwise unremarkable. Vascular/Lymphatic: Atherosclerotic calcification of the aorta. No pathologically enlarged lymph nodes. Reproductive: Prostate is normal in size. Other: No free fluid. Bilateral inguinal hernias contain fat. Mesenteries and peritoneum are otherwise unremarkable. Musculoskeletal: Right hip arthroplasty. Degenerative changes in the left hip and spine. Postoperative changes in the spine. No worrisome lytic or sclerotic lesions. IMPRESSION: 1. Bilateral pulmonary nodules are worrisome for metastatic disease. A primary malignancy is not identified in the chest, abdomen or pelvis. 2. Aortic atherosclerosis (ICD10-I70.0). Coronary artery calcification. 3. Enlarged pulmonic trunk, indicative of pulmonary arterial hypertension. Electronically Signed   By: MLorin PicketM.D.   On: 01/30/2022 15:11   DG Elbow Complete Right  Result Date: 01/30/2022 CLINICAL DATA:  Right elbow pain and swelling, history of gout, recent back surgery EXAM: RIGHT ELBOW - COMPLETE 3+ VIEW COMPARISON:  None Available. FINDINGS: No displaced fracture or dislocation of the right elbow. Mild elbow joint arthrosis. Large elbow joint effusion. IMPRESSION: 1. No displaced fracture or dislocation of the right elbow. 2. Large elbow joint effusion. 3. Mild elbow joint arthrosis. No specific evidence of erosive arthropathy given reported history of gout. Electronically Signed   By: ADelanna AhmadiM.D.   On: 01/30/2022 14:48   DG Chest 2 View  Result Date: 01/30/2022 CLINICAL DATA:  Infection. EXAM: CHEST - 2 VIEW COMPARISON:  Chest x-ray November 04, 2008.  FINDINGS: Streaky bibasilar subsegmental atelectasis and/or scar. No confluent consolidation. Possible trace bilateral pleural effusions. No visible pneumothorax. Cardiomediastinal silhouette is within normal limits. IMPRESSION: 1. Subsegmental bibasilar atelectasis/scar. 2. Possible trace bilateral pleural effusions. Electronically Signed   By: FMargaretha SheffieldM.D.   On: 01/30/2022 11:22    Assessment/Plan: Right elbow joint fluid resulted in intracellular calcium pyrophosphate crystals - indicating pseudogout - awaiting any culture results from aspiration, though this is likely just pseudogout and not septic arthritis - steroid injection into right elbow at the time of aspiration was performed yesterday - will add on IV solumedrol - patient requested removal of splint this morning, I'm ok with him just using this  and/or sling for comfort - ok to WBAT RUE - will recheck clinically this afternoon   Contact information:   Merlene Pulling, PA-C Weekdays 8-5  After hours and holidays please check [email or web address] for group call information for [facility]  Ventura Bruns 01/31/2022, 6:53 AM

## 2022-01-31 NOTE — Progress Notes (Signed)
Left LE venous study completed. Please see CV Proc for preliminary results.  Nefi Musich BS, RVT 01/31/2022 2:16 PM

## 2022-01-31 NOTE — Progress Notes (Signed)
PROGRESS NOTE  [name] ALP:[id number] DOB: 11-04-1945 DOA: 01/30/2022 PCP: Consuello Closs, MD  HPI/Recap of past 24 hours: [name] is a 76 y.o. male with medical history significant for recently diagnosed metastatic melanoma from a rash located on his left medial calf, recent microlumbar decompression surgery in the lumbar spine 4 days ago, previous CVA, peptic ulcer disease, hypertension, hyperlipidemia who presented to the [facility] ED with multiple complaints including right elbow and right wrist pain.  The pain has been progressive for the past 4 days.  No prior history of gout.     Contrast CT chest abdomen and pelvis was done for staging.  It revealed bilateral pulmonary nodules worrisome for metastatic disease, enlarged pulmonary trunk indicative of pulmonary arterial hypertension.     Due to concern for possible septic arthritis, EDP discussed the case with interventional radiology for possible aspiration of joint to rule out septic arthritis.  EDP also discussed the case with medical oncology due to new diagnosis of metastatic melanoma with mets to the lungs.  Antibiotics were held in anticipation for joint aspiration.  TRH, hospitalist service was asked to admit.   Joint fluid revealed crystals consistent with pseudogout.  Body fluid culture no growth to date.  The patient received steroid injection into the right elbow at the time of aspiration on 01/30/2022.  Started on IV Solu-Medrol 40 mg daily for pseudogout flare by orthopedic surgery.  01/31/2022: The patient was seen at his bedside.  His wife was present in the room.  Assessment/Plan: Principal Problem:   Septic arthritis (HCC)  Pseudogout flare Joint fluid with intracellular calcium pyrophosphate crystals consistent with pseudogout. Uric acid 5.3 on 01/30/22 The patient received steroid injection into the right elbow at the time of aspiration on 01/30/2022.  Started on IV Solu-Medrol 40 mg daily for pseudogout flare by orthopedic  surgery. Started on empiric IV antibiotics due to concern for septic arthritis, DC antibiotics with negative cultures or when active infective process is ruled out.     Newly diagnosed metastatic melanoma Left lower extremity medial calf rash, biopsied, results revealed metastatic melanoma Contrast CT chest abdomen pelvis done for staging MRI brain with and without contrast ordered, follow results Seen by Dr. Earlie Server, medical oncology for pulmonary malignancy, appreciate assistance.  Left lower extremity edema, POA Obtain Doppler ultrasound of the left lower extremity to rule out DVT Elevate extremity   Recent back surgery POD #5 Analgesics as needed with bowel regimen   Essential hypertension BP is currently at goal. Continue home home amlodipine Continue to monitor vital signs       DVT prophylaxis: Subcu Lovenox daily   Code Status: Full code   Family Communication: Wife at bedside.   Disposition Plan: Admitted to telemetry unit   Consults called: Orthopedic surgery and medical oncology consulted by EDP   Admission status: Inpatient status     Status is: Inpatient The patient requires at least 2 midnights for further evaluation and treatment of present condition.       Objective: Vitals:   01/30/22 2300 01/30/22 2335 01/31/22 0821 01/31/22 1130  BP: 104/68 (!) 151/70 (!) 144/54 (!) 132/58  Pulse:  83 72 70  Resp:  '20 20 17  '$ Temp:  98.5 F (36.9 C) 98 F (36.7 C) 98.7 F (37.1 C)  TempSrc:   Oral Oral  SpO2:  98% 98% 95%    Intake/Output Summary (Last 24 hours) at 01/31/2022 1155 Last data filed at 01/31/2022 0800 Gross per 24 hour  Intake 220 ml  Output --  Net 220 ml   There were no vitals filed for this visit.  Exam:  General: 76 y.o. year-old male well developed well nourished in no acute distress.  Alert and oriented x3. Cardiovascular: Regular rate and rhythm with no rubs or gallops.  No thyromegaly or JVD noted.   Respiratory: Clear to  auscultation with no wheezes or rales. Good inspiratory effort. Abdomen: Soft nontender nondistended with normal bowel sounds x4 quadrants. Musculoskeletal: Left lower extremity 1+ pitting edema. Skin: Left medial calf rash. Psychiatry: Mood is appropriate for condition and setting   Data Reviewed: CBC: Recent Labs  Lab 01/27/22 0750 01/30/22 1042 01/30/22 1518 01/31/22 0507  WBC 21.3* 18.3* 14.5* 14.9*  NEUTROABS  --  15.0*  --  11.9*  HGB 12.6* 13.3 10.5* 12.1*  HCT 36.6* 38.3* 31.3* 35.6*  MCV 88.6 87.0 90.5 88.1  PLT 220 219 183 119   Basic Metabolic Panel: Recent Labs  Lab 01/27/22 0750 01/30/22 1042 01/30/22 1518 01/31/22 0507  NA 133* 131*  --  138  K 3.8 3.9  --  4.5  CL 102 100  --  107  CO2 25 22  --  25  GLUCOSE 128* 138*  --  138*  BUN 23 19  --  20  CREATININE 1.07 0.91 0.95 0.82  CALCIUM 8.1* 8.3*  --  8.5*  MG  --   --   --  2.4  PHOS  --   --   --  3.9   GFR: Estimated Creatinine Clearance: 95.7 mL/min (by C-G formula based on SCr of 0.82 mg/dL). Liver Function Tests: Recent Labs  Lab 01/30/22 1042 01/31/22 0507  AST 16 15  ALT 14 14  ALKPHOS 48 48  BILITOT 2.0* 1.4*  PROT 6.6 6.0*  ALBUMIN 3.4* 3.0*   No results for input(s): "LIPASE", "AMYLASE" in the last 168 hours. No results for input(s): "AMMONIA" in the last 168 hours. Coagulation Profile: No results for input(s): "INR", "PROTIME" in the last 168 hours. Cardiac Enzymes: No results for input(s): "CKTOTAL", "CKMB", "CKMBINDEX", "TROPONINI" in the last 168 hours. BNP (last 3 results) No results for input(s): "PROBNP" in the last 8760 hours. HbA1C: No results for input(s): "HGBA1C" in the last 72 hours. CBG: No results for input(s): "GLUCAP" in the last 168 hours. Lipid Profile: No results for input(s): "CHOL", "HDL", "LDLCALC", "TRIG", "CHOLHDL", "LDLDIRECT" in the last 72 hours. Thyroid Function Tests: No results for input(s): "TSH", "T4TOTAL", "FREET4", "T3FREE", "THYROIDAB"  in the last 72 hours. Anemia Panel: No results for input(s): "VITAMINB12", "FOLATE", "FERRITIN", "TIBC", "IRON", "RETICCTPCT" in the last 72 hours. Urine analysis:    Component Value Date/Time   COLORURINE YELLOW 01/30/2022 [place] 01/30/2022 1219   LABSPEC 1.020 01/30/2022 1219   PHURINE 5.0 01/30/2022 1219   GLUCOSEU 50 (A) 01/30/2022 1219   HGBUR NEGATIVE 01/30/2022 1219   BILIRUBINUR NEGATIVE 01/30/2022 1219   KETONESUR 5 (A) 01/30/2022 1219   PROTEINUR 30 (A) 01/30/2022 1219   NITRITE NEGATIVE 01/30/2022 1219   LEUKOCYTESUR NEGATIVE 01/30/2022 1219   Sepsis Labs: '@LABRCNTIP'$ (procalcitonin:4,lacticidven:4)  ) Recent Results (from the past 240 hour(s))  Body fluid culture w Gram Stain     Status: None (Preliminary result)   Collection Time: 01/30/22  6:11 PM   Specimen: Synovium; Body Fluid  Result Value Ref Range Status   Specimen Description   Final    SYNOVIAL Performed at [facility] [address]  [place], [place] [address]    Special Requests   Final    NONE Performed at [facility], [place] [address], [place], [place] [address]    Gram Stain   Final    MODERATE WBC PRESENT, PREDOMINANTLY PMN NO ORGANISMS SEEN    Culture   Final    NO GROWTH < 12 HOURS Performed at [facility] [address] [place], [place] [address]    Report Status PENDING  Incomplete      Studies: DG Wrist Complete Right  Result Date: 01/30/2022 CLINICAL DATA:  Evaluate for osteomyelitis. History of pain and swelling at the wrist joint. EXAM: RIGHT WRIST - COMPLETE 3+ VIEW COMPARISON:  None Available. FINDINGS: There is no evidence of fracture or dislocation. There are mild degenerative changes seen and are more prominent at the distal radial intercarpal and carpometacarpal joints. There is small bony excrescence seen at the radial basal aspect of the second metacarpal. Soft tissues are unremarkable. IMPRESSION: Mild  degenerative changes and are more prominent at the distal radial intercarpal and carpometacarpal joint with some narrowing of the joint space Electronically Signed   By: Frazier Richards M.D.   On: 01/30/2022 16:26   CT CHEST ABDOMEN PELVIS W CONTRAST  Result Date: 01/30/2022 CLINICAL DATA:  Metastatic disease evaluation. * Tracking Code: BO * EXAM: CT CHEST, ABDOMEN, AND PELVIS WITH CONTRAST TECHNIQUE: Multidetector CT imaging of the chest, abdomen and pelvis was performed following the standard protocol during bolus administration of intravenous contrast. RADIATION DOSE REDUCTION: This exam was performed according to the departmental dose-optimization program which includes automated exposure control, adjustment of the mA and/or kV according to patient size and/or use of iterative reconstruction technique. CONTRAST:  167m OMNIPAQUE IOHEXOL 300 MG/ML  SOLN COMPARISON:  CT chest 11/23/2004 and CT abdomen pelvis 07/07/2018. FINDINGS: CT CHEST FINDINGS Cardiovascular: Atherosclerotic calcification of the aorta, aortic valve and coronary arteries. Enlarged pulmonic trunk. Heart size normal. No pericardial effusion. Mediastinum/Nodes: No pathologically enlarged mediastinal, hilar or axillary lymph nodes. Esophagus is grossly unremarkable. Lungs/Pleura: There are at least 4 randomly distributed pulmonary nodules in the mid and lower lung zones, measuring up to 9 mm in the lateral segment right middle lobe (6/86). Scattered volume loss in the lung bases. No pleural fluid. Airway is unremarkable. Musculoskeletal: Degenerative changes in the spine. No worrisome lytic or sclerotic lesions. CT ABDOMEN PELVIS FINDINGS Hepatobiliary: Subcentimeter low-attenuation lesion in the dome of the liver is unchanged, indicative of a cyst. Liver and gallbladder are otherwise unremarkable. No biliary ductal dilatation. Pancreas: Negative. Spleen: Negative. Adrenals/Urinary Tract: Adrenal glands are unremarkable. Low-attenuation lesions  in the kidneys measure up to 7.0 cm on the left and are indicative of cysts. No follow-up necessary. Ureters are decompressed. Bladder is largely obscured by streak artifact from a right hip arthroplasty. Stomach/Bowel: Partial gastrectomy. Stomach, small bowel, appendix and colon are otherwise unremarkable. Vascular/Lymphatic: Atherosclerotic calcification of the aorta. No pathologically enlarged lymph nodes. Reproductive: Prostate is normal in size. Other: No free fluid. Bilateral inguinal hernias contain fat. Mesenteries and peritoneum are otherwise unremarkable. Musculoskeletal: Right hip arthroplasty. Degenerative changes in the left hip and spine. Postoperative changes in the spine. No worrisome lytic or sclerotic lesions. IMPRESSION: 1. Bilateral pulmonary nodules are worrisome for metastatic disease. A primary malignancy is not identified in the chest, abdomen or pelvis. 2. Aortic atherosclerosis (ICD10-I70.0). Coronary artery calcification. 3. Enlarged pulmonic trunk, indicative of pulmonary arterial hypertension. Electronically Signed   By: MLorin PicketM.D.   On:  01/30/2022 15:11   DG Elbow Complete Right  Result Date: 01/30/2022 CLINICAL DATA:  Right elbow pain and swelling, history of gout, recent back surgery EXAM: RIGHT ELBOW - COMPLETE 3+ VIEW COMPARISON:  None Available. FINDINGS: No displaced fracture or dislocation of the right elbow. Mild elbow joint arthrosis. Large elbow joint effusion. IMPRESSION: 1. No displaced fracture or dislocation of the right elbow. 2. Large elbow joint effusion. 3. Mild elbow joint arthrosis. No specific evidence of erosive arthropathy given reported history of gout. Electronically Signed   By: Delanna Ahmadi M.D.   On: 01/30/2022 14:48    Scheduled Meds:  amLODipine  5 mg Oral Daily   enoxaparin (LOVENOX) injection  40 mg Subcutaneous Q24H   methylPREDNISolone (SOLU-MEDROL) injection  40 mg Intravenous Q24H    Continuous Infusions:  cefTRIAXone  (ROCEPHIN)  IV Stopped (01/30/22 2009)   lactated ringers 50 mL/hr at 01/30/22 2009   vancomycin       LOS: 1 day     Kayleen Memos, MD [facility] Pager [phone]  If 7PM-7AM, please contact night-coverage [email or web address] Password [facility] 01/31/2022, 11:55 AM

## 2022-01-31 NOTE — Progress Notes (Incomplete)
Introduction: 44 YOWM admitted on 01/30/22 w/ complaints of swelling. History of present illness: Admitted 01/30/22 w/ swelling of the right wrist and right elbow w/ pain. Pain has progressed over the 4 days preceding admit. Initial concern for  septic arthritis led to treatment w/ ceftriaxone and vancomycin. . Patient had another admit on 01/26/22 d/t complaints of discomfort from spinal stenosis of L4 and 5; patient treated w/ microlumbar decompression and lateral mass fusion before discharge.   Patient currently resting, not in acute distress  Past medical history/allergies/family history pertinent to current illness: - Metastatic melanoma, CVA (2010), colon polyps, nephrolithiasis, HLD, HTN, peptic ulcers, gout - THA, gastrectomy, lumbar decompression PTA meds: - Docusate 100 mg cap BID - Oxycodone 5 mg IR  tabs Q4H PRN  - Miralax 17 g packet once daily Social hx: - 2-3 beers each weekend - former smoker (quit 40 yrs ago) Physical exam/relevant labs, diagnostic testing/imaging: - MRSA Nasal PCR: - - Chest CT: calcification of aorta, AR, and coronary arteries, 4 pulmonary nodules present   Problem based assessment and plan:  - Problem 1: Wrist and Elbow Swelling/pain  A. Assessment: Wrist X-rays show no evidence of fracture or dislocation. degenerative changes noted in bone noted. Right elbow X-Ray notes no displacement or fracture, but a large joint effusion along w/ arthrosis noted. Synovial fluid cultures have yielded no bacterial organisms at 12 hrs of growth. However, patient's RBC (4.04), and WBC (14.9) are elevated. WBC elevation may be attributable to methylprednisolone. Normal temperature indicates no systemic immune response. Patient's blood uric acid levels are normal (5.3), however, joint fluid revealed calcium pyrophosphate crystals consistent  w/ pseudogout in testing for the possibility of gout. Patient received doses of vancomycin and ceftriaxone to treat possible septic  arthritis, but they have been held in anticipation of joint aspiration, patient has not received either since 01/30/22. To treat swelling and pain patient received methylprednisolone injections. B. Plan - D/c Ceftriaxone  - Continue methylprednisolone 40 mg injection Q 24 hrs - Monitor synovial cultures for growth - Monitor blood glucose daily - Monitor for reduction in joint erythema - Monitor CBC daily  - Problem 2: LLE Edema A. Assessment: Patient shows swelling in LLE.  B. Plan:  - Lower extremity doppler scheduled for 4:00 PM 01/31/22 - Continue DVT PPX w/ Enoxaparin  - Problem 3: HTN  A. Assessment Patient's BP in 140's-130's/50's, indicating systolic HTN and diastolic hypotension.Patient currently treated w/ amlodipine 5 mg daily.  B. Plan: - Continue amlodipine 5 mg  - Monitor BP and HR - Monitor for severity of edema   Problem 4 : Pain from recent operation  A. Assessment: Patient had recent spinal operation for which he was taking oxycodone 5 mg IR tabs PTA. Pain was initially treated w/ hydromorphone IV and oxycodone PO. Patient has not received pain medication since 6/12, indicating adequate pain control. Patient has not had a bowel movement since admit.  B. Plan - Continue current pain regimen PRN - Consider addition of Senna/docusate if no BM's w/ adequate food intake by tomorrow  - Problem 5: Metastatic Melanoma  A. Assessment: Biopsy on calf rash revealed melanoma and a CT scan showed multiple lung nodules consistent w/ metastatic cancer  B. Plan: - Refer to oncology for mgmt

## 2022-01-31 NOTE — Progress Notes (Signed)
Initial Nutrition Assessment  DOCUMENTATION CODES:   Obesity unspecified  INTERVENTION:   -Ensure MAX Protein po daily, each supplement provides 150 kcal and 30 grams of protein   -"High Protein" handout in AVS   NUTRITION DIAGNOSIS:   Increased nutrient needs related to chronic illness as evidenced by estimated needs.  GOAL:   Patient will meet greater than or equal to 90% of their needs  MONITOR:   PO intake, Supplement acceptance, Labs, Weight trends, I & O's  REASON FOR ASSESSMENT:   Malnutrition Screening Tool    ASSESSMENT:   76 y.o. male with medical history significant for recently diagnosed metastatic melanoma from a rash located on his left medial calf, recent microlumbar decompression surgery in the lumbar spine 4 days ago, previous CVA, peptic ulcer disease, hypertension, hyperlipidemia who presented to the [facility] ED with multiple complaints including right elbow and right wrist pain.  Patient in room with daughter at bedside. Pt reports appetite is better today, he ate a good breakfast of french toast, eggs, grits and orange juice. Pt about to order lunch.  Pt states his appetite decreased following surgery of his lumbar spine on 6/8. Prior to that pt ate normally, eating plenty of protein per daughter. Pt drinks Premier Protein at home. Will order Ensure Max here.  Pt with new diagnosis of metastatic melanoma of left calf. Per oncology, treatment TBD.  Will provide high protein handout in AVS.   Per weight records, pt has lost 7 lbs since 6/1. Current weight: 229 lbs.  Medications: Lactated ringers  Labs reviewed.  NUTRITION - FOCUSED PHYSICAL EXAM:  No depletions noted.  Diet Order:   Diet Order             Diet regular Room service appropriate? Yes; Fluid consistency: Thin  Diet effective now                   EDUCATION NEEDS:   Education needs have been addressed  Skin:  Skin Assessment: Skin Integrity Issues: Skin Integrity Issues::  Incisions Incisions: 6/8 back  Last BM:  6/11  Height:   Ht Readings from Last 1 Encounters:  01/26/22 '5\' 11"'$  (1.803 m)    Weight:   Wt Readings from Last 1 Encounters:  01/26/22 104.3 kg    BMI:  31 kg/m^2  Estimated Nutritional Needs:   Kcal:  1900-2100  Protein:  100-110g  Fluid:  2L/day   Clayton Bibles, MS, RD, LDN Inpatient Clinical Dietitian Contact information available via Amion

## 2022-01-31 NOTE — Consult Note (Addendum)
[place]  Telephone:(336) [phone] Fax:(336) [phone]   MEDICAL ONCOLOGY - INITIAL CONSULTATION  Referral MD: Dr. Irene Anthony   Reason for Referral: Recent diagnosis of melanoma, bilateral pulmonary nodules  HPI: [name] is a 76 year old male with a past medical history significant for recently diagnosed melanoma, recent microlumbar decompression surgery in the lumbar spine, history of CVA, peptic ulcer disease, hypertension, hyperlipidemia.  He presented to the emergency department with right elbow and right wrist pain.  Pain has been progressive.  He has been seen by Ortho and underwent aspiration of his right elbow joint.  Fluid showed intracellular calcium pyrophosphate crystals indicative of pseudogout.  Culture pending.  A CT chest/abdomen/pelvis was ordered in the ED to evaluate for metastatic disease given his recent diagnosis of melanoma.  This showed bilateral pulmonary nodules concerning for metastatic disease.  There was no primary malignancy in the chest/abdomen/pelvis.  This morning, the patient reports that he is feeling better.  His right wrist and elbow pain have improved.  The patient reports that he was seen by dermatology as an outpatient recently and underwent a biopsy of a lesion on his left calf.  He also had a biopsy of the left dorsal foot.  See below for pathology results.  The patient reports that he has had a good appetite and has not lost any weight recently.  Denies headaches and dizziness.  Denies chest pain, shortness of breath, abdominal pain, nausea, vomiting, constipation, diarrhea.  He denies any other concerning skin lesions.  The patient is married.  He has 2 children.  Denies history of tobacco use.  Drinks alcohol moderately.  Denies family history of malignancy.  Medical oncology was asked to see the patient make recommendations regarding his newly diagnosed melanoma.  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;  Service: Orthopedics;  Laterality: Anthony/A;   PARTIAL GASTRECTOMY     TOTAL HIP ARTHROPLASTY Right 06/18/2018   Procedure: RIGHT TOTAL HIP ARTHROPLASTY ANTERIOR APPROACH;  Surgeon: Benjamin Cancel, MD;  Location: [facility];  Service: Orthopedics;  Laterality: Right;  :   Current Facility-Administered Medications  Medication Dose Route Frequency Provider Last Rate Last Admin   acetaminophen (TYLENOL) tablet 650 mg  650 mg Oral Q6H PRN [name], Benjamin N, DO       amLODipine (NORVASC) tablet 5 mg  5 mg Oral Daily [place], Benjamin N, DO   5 mg at 01/30/22 1927   cefTRIAXone (ROCEPHIN) 2 g in sodium chloride 0.9 % 100 mL IVPB  2 g Intravenous Q24H [name], Benjamin N, DO   Stopped at 01/30/22 2009   enoxaparin (LOVENOX) injection 40 mg  40 mg Subcutaneous Q24H [name] N, DO   40 mg at 01/30/22 2157   HYDROmorphone (DILAUDID) injection 0.5 mg  0.5 mg Intravenous Q4H PRN [name] N, DO       lactated ringers infusion   Intravenous Continuous Benjamin Memos, DO 50 mL/hr at 01/30/22 2009  New Bag at 01/30/22 2009   melatonin tablet 5 mg  5 mg Oral QHS PRN [name] N, DO       methylPREDNISolone sodium succinate (SOLU-MEDROL) 40 mg/mL injection 40 mg  40 mg Intravenous Q24H [name], Benjamin K, PA-C       oxyCODONE (Oxy IR/ROXICODONE) immediate release tablet 5 mg  5 mg Oral Q6H PRN [name] N, DO       polyethylene glycol (MIRALAX / GLYCOLAX) packet 17 g  17 g Oral Daily PRN [name] N, DO       prochlorperazine (COMPAZINE) injection 10  mg  10 mg Intravenous Q6H PRN [name], Benjamin N, DO       vancomycin (VANCOREADY) IVPB 1500 mg/300 mL  1,500 mg Intravenous Q24H [name], [name]          Allergies  Allergen Reactions   Morphine Itching    Patient states it is tolerable itching  :  History reviewed. No pertinent family history.:   Social History   Socioeconomic History   Marital status: Married    Spouse name: Not on file   Number of children: Not on file   Years of education: Not on file   Highest education level: Not on file  Occupational History   Not on file  Tobacco Use   Smoking status: Former   Smokeless tobacco: Never   Tobacco comments:    quit 40 years ago   Vaping Use   Vaping Use: Never used  Substance and Sexual Activity   Alcohol use: Yes    Comment: moderate  ; 2-3 beers each weekend    Drug use: Not on file   Sexual activity: Not on file  Other Topics Concern   Not on file  Social History Narrative   Not on file   Social Determinants of Health   Financial Resource Strain: Not on file  Food Insecurity: Not on file  Transportation Needs: Not on file  Physical Activity: Not on file  Stress: Not on file  Social Connections: Not on file  Intimate Partner Violence: Not on file  :  Review of Systems: A comprehensive 14 point review of systems was negative except as noted in the HPI.  Exam: Patient Vitals for the past 24 hrs:  BP Temp Temp src Pulse Resp SpO2  01/30/22 2335 (!) 151/70 98.5 F (36.9 C) -- 83 20 98 %  01/30/22 2300 104/68 -- -- -- -- --  01/30/22 2255 -- 99.2 F (37.3 C) Oral -- -- --  01/30/22 2230 (!) 150/75 -- -- 79 (!) 23 93 %  01/30/22 2200 (!) 138/116 -- -- 78 (!) 21 93 %  01/30/22 2016 -- (!) 97.5 F (36.4 C) Oral -- -- --  01/30/22 1930 (!) 129/52 -- -- 86 19 93 %  01/30/22 1927 128/67 -- -- -- -- --  01/30/22 1630 (!) 155/70 -- -- 94 18 93 %  01/30/22 1545 138/70 -- -- 86 18 94 %  01/30/22 1330 (!) 144/68 -- -- 84 18 96 %  01/30/22 1300 (!)  142/72 -- -- 81 18 96 %  01/30/22 1217 94/84 -- -- 82 18 97 %  01/30/22 1027 140/69 99.7 F (37.6 C) Oral 87 18 92 %    General:  well-nourished in no acute distress.   Eyes:  no scleral icterus.   ENT:  There were no oropharyngeal lesions.    Lymphatics:  Negative cervical, supraclavicular or axillary adenopathy.   Respiratory: lungs  were clear bilaterally without wheezing or crackles.   Cardiovascular:  Regular rate and rhythm, S1/S2, without murmur, rub or gallop.  There was no pedal edema.   GI: Positive bowel sounds, soft, nontender.   Skin: Well-healed shave biopsy sites on his left calf and left foot.  There are some elevated erythematous area surrounding the biopsy site on the left calf. Neuro exam was nonfocal. Patient was alert and oriented.  Attention was good.   Language was appropriate.  Mood was normal without depression.  Speech was not pressured.  Thought content was not tangential.     Lab Results  Component Value Date   WBC 14.9 (H) 01/31/2022   HGB 12.1 (L) 01/31/2022   HCT 35.6 (L) 01/31/2022   PLT 218 01/31/2022   GLUCOSE 138 (H) 01/31/2022   ALT 14 01/31/2022   AST 15 01/31/2022   NA 138 01/31/2022   Anthony 4.5 01/31/2022   CL 107 01/31/2022   CREATININE 0.82 01/31/2022   BUN 20 01/31/2022   CO2 25 01/31/2022    DG Wrist Complete Right  Result Date: 01/30/2022 CLINICAL DATA:  Evaluate for osteomyelitis. History of pain and swelling at the wrist joint. EXAM: RIGHT WRIST - COMPLETE 3+ VIEW COMPARISON:  None Available. FINDINGS: There is no evidence of fracture or dislocation. There are mild degenerative changes seen and are more prominent at the distal radial intercarpal and carpometacarpal joints. There is small bony excrescence seen at the radial basal aspect of the second metacarpal. Soft tissues are unremarkable. IMPRESSION: Mild degenerative changes and are more prominent at the distal radial intercarpal and carpometacarpal joint with some narrowing of the  joint space Electronically Signed   By: [name] M.D.   On: 01/30/2022 16:26   CT CHEST ABDOMEN PELVIS W CONTRAST  Result Date: 01/30/2022 CLINICAL DATA:  Metastatic disease evaluation. * Tracking Code: BO * EXAM: CT CHEST, ABDOMEN, AND PELVIS WITH CONTRAST TECHNIQUE: Multidetector CT imaging of the chest, abdomen and pelvis was performed following the standard protocol during bolus administration of intravenous contrast. RADIATION DOSE REDUCTION: This exam was performed according to the departmental dose-optimization program which includes automated exposure control, adjustment of the mA and/or kV according to patient size and/or use of iterative reconstruction technique. CONTRAST:  158m OMNIPAQUE IOHEXOL 300 MG/ML  SOLN COMPARISON:  CT chest 11/23/2004 and CT abdomen pelvis 07/07/2018. FINDINGS: CT CHEST FINDINGS Cardiovascular: Atherosclerotic calcification of the aorta, aortic valve and coronary arteries. Enlarged pulmonic trunk. Heart size normal. No pericardial effusion. Mediastinum/Nodes: No pathologically enlarged mediastinal, hilar or axillary lymph nodes. Esophagus is grossly unremarkable. Lungs/Pleura: There are at least 4 randomly distributed pulmonary nodules in the mid and lower lung zones, measuring up to 9 mm in the lateral segment right middle lobe (6/86). Scattered volume loss in the lung bases. No pleural fluid. Airway is unremarkable. Musculoskeletal: Degenerative changes in the spine. No worrisome lytic or sclerotic lesions. CT ABDOMEN PELVIS FINDINGS Hepatobiliary: Subcentimeter low-attenuation lesion in the dome of the liver is unchanged, indicative of a cyst. Liver and gallbladder are otherwise unremarkable. No biliary ductal dilatation. Pancreas: Negative. Spleen: Negative. Adrenals/Urinary Tract: Adrenal glands are unremarkable. Low-attenuation lesions in the kidneys measure up to 7.0 cm on the left and are indicative of cysts. No follow-up necessary. Ureters are decompressed.  Bladder is largely obscured by streak artifact from a right hip arthroplasty. Stomach/Bowel: Partial gastrectomy. Stomach, small bowel, appendix and colon are otherwise unremarkable. Vascular/Lymphatic: Atherosclerotic calcification of the aorta. No pathologically enlarged lymph nodes. Reproductive: Prostate  is normal in size. Other: No free fluid. Bilateral inguinal hernias contain fat. Mesenteries and peritoneum are otherwise unremarkable. Musculoskeletal: Right hip arthroplasty. Degenerative changes in the left hip and spine. Postoperative changes in the spine. No worrisome lytic or sclerotic lesions. IMPRESSION: 1. Bilateral pulmonary nodules are worrisome for metastatic disease. A primary malignancy is not identified in the chest, abdomen or pelvis. 2. Aortic atherosclerosis (ICD10-I70.0). Coronary artery calcification. 3. Enlarged pulmonic trunk, indicative of pulmonary arterial hypertension. Electronically Signed   By: [name] M.D.   On: 01/30/2022 15:11   DG Elbow Complete Right  Result Date: 01/30/2022 CLINICAL DATA:  Right elbow pain and swelling, history of gout, recent back surgery EXAM: RIGHT ELBOW - COMPLETE 3+ VIEW COMPARISON:  None Available. FINDINGS: No displaced fracture or dislocation of the right elbow. Mild elbow joint arthrosis. Large elbow joint effusion. IMPRESSION: 1. No displaced fracture or dislocation of the right elbow. 2. Large elbow joint effusion. 3. Mild elbow joint arthrosis. No specific evidence of erosive arthropathy given reported history of gout. Electronically Signed   By: [name] M.D.   On: 01/30/2022 14:48   DG Chest 2 View  Result Date: 01/30/2022 CLINICAL DATA:  Infection. EXAM: CHEST - 2 VIEW COMPARISON:  Chest x-ray November 04, 2008. FINDINGS: Streaky bibasilar subsegmental atelectasis and/or scar. No confluent consolidation. Possible trace bilateral pleural effusions. No visible pneumothorax. Cardiomediastinal silhouette is within normal limits.  IMPRESSION: 1. Subsegmental bibasilar atelectasis/scar. 2. Possible trace bilateral pleural effusions. Electronically Signed   By: [name] M.D.   On: 01/30/2022 11:22   DG Lumbar Spine 2-3 Views  Result Date: 01/26/2022 CLINICAL DATA:  [id number] intraoperative lumbar spine imaging for localization of the intervertebral discs EXAM: LUMBAR SPINE - 2-3 VIEW COMPARISON:  Lumbar spine performed on the same Anthony earlier. FINDINGS: Three cross-table lateral views of the lumbar spine were obtained for interpretation. There are surgical instruments and marker needles seen with tip of the marker needle projecting at the posterior aspect of the L5-S1 disc level in image 2 and 1 of the marker needles at the lamina/disc space at L5-S1 and and second surgical instrument at the level of posterior mid body of L4 vertebral body in the image 3. There has been no significant interval change in the severe lumbar spondylosis. IMPRESSION: Surgical marking needles as described above Electronically Signed   By: [name] M.D.   On: 01/26/2022 10:27   DG Lumbar Spine 2-3 Views  Addendum Date: 01/26/2022   ADDENDUM REPORT: 01/26/2022 10:11 ADDENDUM: Please note that this study was performed at 6:15 am on 01/26/2022 not pm as it was initially mentioned on the lumbar spine X-rays. Electronically Signed   By: [name] M.D.   On: 01/26/2022 10:11   Result Date: 01/26/2022 CLINICAL DATA:  [id number] preoperative study EXAM: LUMBAR SPINE - 2-3 VIEW COMPARISON:  None Available. FINDINGS: Severe lumbar spondylosis with loss of the disc space from T12 through L5. Approximally 4 mm retrolisthesis at L1-L2, 7 mm retrolisthesis at L2-L3, 5 mm anterolisthesis at L4-L5 and 3 mm anterolisthesis at L5-S1. There are prominent marginal osteophytes and endplate sclerotic changes seen. Moderate facet hypertrophic changes as well in the lower lumbar spine. There is no evidence of lumbar spine fracture. IMPRESSION: Severe lumbar spondylosis with  loss of the disc space, prominent marginal osteophytes and endplate sclerotic changes. Retrolisthesis and anterolisthesis as above. Electronically Signed: By: [name] M.D. On: 01/26/2022 08:28     DG Wrist Complete Right  Result  Date: 01/30/2022 CLINICAL DATA:  Evaluate for osteomyelitis. History of pain and swelling at the wrist joint. EXAM: RIGHT WRIST - COMPLETE 3+ VIEW COMPARISON:  None Available. FINDINGS: There is no evidence of fracture or dislocation. There are mild degenerative changes seen and are more prominent at the distal radial intercarpal and carpometacarpal joints. There is small bony excrescence seen at the radial basal aspect of the second metacarpal. Soft tissues are unremarkable. IMPRESSION: Mild degenerative changes and are more prominent at the distal radial intercarpal and carpometacarpal joint with some narrowing of the joint space Electronically Signed   By: [name] M.D.   On: 01/30/2022 16:26   CT CHEST ABDOMEN PELVIS W CONTRAST  Result Date: 01/30/2022 CLINICAL DATA:  Metastatic disease evaluation. * Tracking Code: BO * EXAM: CT CHEST, ABDOMEN, AND PELVIS WITH CONTRAST TECHNIQUE: Multidetector CT imaging of the chest, abdomen and pelvis was performed following the standard protocol during bolus administration of intravenous contrast. RADIATION DOSE REDUCTION: This exam was performed according to the departmental dose-optimization program which includes automated exposure control, adjustment of the mA and/or kV according to patient size and/or use of iterative reconstruction technique. CONTRAST:  133m OMNIPAQUE IOHEXOL 300 MG/ML  SOLN COMPARISON:  CT chest 11/23/2004 and CT abdomen pelvis 07/07/2018. FINDINGS: CT CHEST FINDINGS Cardiovascular: Atherosclerotic calcification of the aorta, aortic valve and coronary arteries. Enlarged pulmonic trunk. Heart size normal. No pericardial effusion. Mediastinum/Nodes: No pathologically enlarged mediastinal, hilar or axillary  lymph nodes. Esophagus is grossly unremarkable. Lungs/Pleura: There are at least 4 randomly distributed pulmonary nodules in the mid and lower lung zones, measuring up to 9 mm in the lateral segment right middle lobe (6/86). Scattered volume loss in the lung bases. No pleural fluid. Airway is unremarkable. Musculoskeletal: Degenerative changes in the spine. No worrisome lytic or sclerotic lesions. CT ABDOMEN PELVIS FINDINGS Hepatobiliary: Subcentimeter low-attenuation lesion in the dome of the liver is unchanged, indicative of a cyst. Liver and gallbladder are otherwise unremarkable. No biliary ductal dilatation. Pancreas: Negative. Spleen: Negative. Adrenals/Urinary Tract: Adrenal glands are unremarkable. Low-attenuation lesions in the kidneys measure up to 7.0 cm on the left and are indicative of cysts. No follow-up necessary. Ureters are decompressed. Bladder is largely obscured by streak artifact from a right hip arthroplasty. Stomach/Bowel: Partial gastrectomy. Stomach, small bowel, appendix and colon are otherwise unremarkable. Vascular/Lymphatic: Atherosclerotic calcification of the aorta. No pathologically enlarged lymph nodes. Reproductive: Prostate is normal in size. Other: No free fluid. Bilateral inguinal hernias contain fat. Mesenteries and peritoneum are otherwise unremarkable. Musculoskeletal: Right hip arthroplasty. Degenerative changes in the left hip and spine. Postoperative changes in the spine. No worrisome lytic or sclerotic lesions. IMPRESSION: 1. Bilateral pulmonary nodules are worrisome for metastatic disease. A primary malignancy is not identified in the chest, abdomen or pelvis. 2. Aortic atherosclerosis (ICD10-I70.0). Coronary artery calcification. 3. Enlarged pulmonic trunk, indicative of pulmonary arterial hypertension. Electronically Signed   By: MLorin PicketM.D.   On: 01/30/2022 15:11   DG Elbow Complete Right  Result Date: 01/30/2022 CLINICAL DATA:  Right elbow pain and  swelling, history of gout, recent back surgery EXAM: RIGHT ELBOW - COMPLETE 3+ VIEW COMPARISON:  None Available. FINDINGS: No displaced fracture or dislocation of the right elbow. Mild elbow joint arthrosis. Large elbow joint effusion. IMPRESSION: 1. No displaced fracture or dislocation of the right elbow. 2. Large elbow joint effusion. 3. Mild elbow joint arthrosis. No specific evidence of erosive arthropathy given reported history of gout. Electronically Signed   By: ADelanna Anthony  M.D.   On: 01/30/2022 14:48   DG Chest 2 View  Result Date: 01/30/2022 CLINICAL DATA:  Infection. EXAM: CHEST - 2 VIEW COMPARISON:  Chest x-ray November 04, 2008. FINDINGS: Streaky bibasilar subsegmental atelectasis and/or scar. No confluent consolidation. Possible trace bilateral pleural effusions. No visible pneumothorax. Cardiomediastinal silhouette is within normal limits. IMPRESSION: 1. Subsegmental bibasilar atelectasis/scar. 2. Possible trace bilateral pleural effusions. Electronically Signed   By: [name] M.D.   On: 01/30/2022 11:22   DG Lumbar Spine 2-3 Views  Result Date: 01/26/2022 CLINICAL DATA:  [id number] intraoperative lumbar spine imaging for localization of the intervertebral discs EXAM: LUMBAR SPINE - 2-3 VIEW COMPARISON:  Lumbar spine performed on the same Anthony earlier. FINDINGS: Three cross-table lateral views of the lumbar spine were obtained for interpretation. There are surgical instruments and marker needles seen with tip of the marker needle projecting at the posterior aspect of the L5-S1 disc level in image 2 and 1 of the marker needles at the lamina/disc space at L5-S1 and and second surgical instrument at the level of posterior mid body of L4 vertebral body in the image 3. There has been no significant interval change in the severe lumbar spondylosis. IMPRESSION: Surgical marking needles as described above Electronically Signed   By: [name] M.D.   On: 01/26/2022 10:27   DG Lumbar Spine 2-3  Views  Addendum Date: 01/26/2022   ADDENDUM REPORT: 01/26/2022 10:11 ADDENDUM: Please note that this study was performed at 6:15 am on 01/26/2022 not pm as it was initially mentioned on the lumbar spine X-rays. Electronically Signed   By: [name] M.D.   On: 01/26/2022 10:11   Result Date: 01/26/2022 CLINICAL DATA:  [id number] preoperative study EXAM: LUMBAR SPINE - 2-3 VIEW COMPARISON:  None Available. FINDINGS: Severe lumbar spondylosis with loss of the disc space from T12 through L5. Approximally 4 mm retrolisthesis at L1-L2, 7 mm retrolisthesis at L2-L3, 5 mm anterolisthesis at L4-L5 and 3 mm anterolisthesis at L5-S1. There are prominent marginal osteophytes and endplate sclerotic changes seen. Moderate facet hypertrophic changes as well in the lower lumbar spine. There is no evidence of lumbar spine fracture. IMPRESSION: Severe lumbar spondylosis with loss of the disc space, prominent marginal osteophytes and endplate sclerotic changes. Retrolisthesis and anterolisthesis as above. Electronically Signed: By: [name] M.D. 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  His wife also has a copy and she was instructed to bring this to appointments as well.  Assessment and Plan:  This is a 76 year old male who presented with skin lesions on his left calf and left foot.  He was seen by dermatology as an outpatient and biopsies consistent with melanoma, metastatic disease favored.  CT imaging performed this admission showed pulmonary nodules concerning for metastatic disease.  Discussed the diagnosis and imaging results with the patient and  his wife.  Recommend staging work-up including an MRI of the brain with and without contrast which will be performed while he is in the [facility].  We also recommend a PET scan which will need to be done as an outpatient.  We briefly discussed systemic treatment options in the form of  immunotherapy.  However, we will have further conversations regarding treatment options as an outpatient. The patient lives in [place] and prefers to be seen at the [facility] cancer center.  I have placed a referral to their office to arrange for outpatient follow-up.  Thank you for this referral.   Benjamin Bussing, DNP, AGPCNP-BC, AOCNP   ADDENDUM: Hematology/Oncology Attending: I had a face-to-face encounter with the patient today.  I reviewed his record, lab, scan and recommended his care plan.  I agree with the above note.  The patient was accompanied by his wife at the bedside.  He is a very pleasant 76 years old white male who was seen by dermatology in [facility] for skin lesion and biopsy of a lesion from the left foot was consistent with metastatic melanoma.  The patient also has a history of decompression surgery in the lumbar spine, stroke, peptic ulcer disease, hypertension and dyslipidemia who presented to the emergency department for evaluation of her right elbow and right wrist pain and he could not move his arm and the pain was progressive.  He was seen by orthopedic surgery and underwent aspiration of the right elbow joint and the findings were indicative of pseudogout.  He was admitted for evaluation and management of his condition.  During his evaluation he had CT scan of the chest, abdomen pelvis in the emergency department that showed evidence of metastatic disease of the recently diagnosed melanoma to the lung.  He has bilateral pulmonary nodules but no other findings in the abdomen and pelvis. We were consulted to see the patient for evaluation and recommendation regarding his condition. I  had a lengthy discussion with the patient and his wife about his current condition and treatment options. I recommended for the patient to complete the staging work-up by ordering MRI of the brain and he will also need a whole-body PET scan after discharge from the [facility]. I discussed with the patient the treatment option and recommended for him consideration of treatment with immunotherapy with ipilimumab 3 Mg/KG and nivolumab 1 Mg/KG every 3 weeks for 4 cycles followed by maintenance treatment with nivolumab 480 Mg IV every 4 weeks.  The patient is interested in treatment but he would like to receive his treatment closer to home in [place]. We will arrange for the patient a follow-up appointment with either Dr. Bobby Rumpf or Dr. Anabel Bene at the [place] [facility] for management of his condition and treatment close to home. Thank you so much for allowing me to participate in the care of Mr. Burston, we will continue to follow-up the patient with you and assist in his management on as-needed basis.  Disclaimer: This note was dictated with voice recognition software. Similar sounding words can inadvertently be transcribed and may be missed upon review. Eilleen Kempf, MD

## 2022-01-31 NOTE — Discharge Instructions (Signed)
Add Protein to Your Meals and Snacks Choose at least one protein food at each meal and snack to increase your daily intake. Strategies Add  cup nonfat dry milk powder or protein powder to make a high-protein milk to drink or to use in recipes that call for milk. Vanilla or peppermint extract or unsweetened cocoa powder could help to boost the flavor. Add hard-cooked eggs, leftover meat, grated cheese, canned beans or tofu to noodles, rice, salads, sandwiches, soups, casseroles, pasta, tuna and other mixed dishes. Add powdered milk or protein powder to hot cereals, meatloaf, casseroles, scrambled eggs, sauces, cream soups, and shakes. Add beans and lentils to salads, soups, casseroles, and vegetable dishes. Eat cottage cheese or yogurt, especially Greek yogurt, with fruit as a snack or dessert. Eat peanut or other nut butters on crackers, bread, toast, waffles, apples, bananas or celery sticks. Add it to milkshakes, smoothies, or desserts. Consider a ready-made protein shake. Your RDN will make recommendations. Small Meal and Snack Ideas These snacks and meals are recommended when you have to eat but aren't necessarily hungry.  They are good choices because they are high in protein and high in calories.  2 graham crackers 2 tablespoons peanut or other nut butter 1 cup milk 2 slices whole wheat toast topped with:  avocado, mashed Seasoning of your choice   cup Greek yogurt  cup fruit  cup granola 2 deviled egg halves 5 whole wheat crackers  1 cup cream of tomato soup  grilled cheese sandwich 1 toasted waffle topped with: 2 tablespoons peanut or nut butter 1 tablespoon jam  Trail mix made with:  cup nuts  cup dried fruit  cup cold cereal, any variety  cup oatmeal or cream of wheat cereal 1 tablespoon peanut or nut butter  cup diced fruit

## 2022-02-01 DIAGNOSIS — C439 Malignant melanoma of skin, unspecified: Secondary | ICD-10-CM | POA: Diagnosis not present

## 2022-02-01 LAB — COMPREHENSIVE METABOLIC PANEL
ALT: 29 U/L (ref 0–44)
AST: 31 U/L (ref 15–41)
Albumin: 3 g/dL — ABNORMAL LOW (ref 3.5–5.0)
Alkaline Phosphatase: 52 U/L (ref 38–126)
Anion gap: 8 (ref 5–15)
BUN: 34 mg/dL — ABNORMAL HIGH (ref 8–23)
CO2: 27 mmol/L (ref 22–32)
Calcium: 8.5 mg/dL — ABNORMAL LOW (ref 8.9–10.3)
Chloride: 104 mmol/L (ref 98–111)
Creatinine, Ser: 1.07 mg/dL (ref 0.61–1.24)
GFR, Estimated: >60 mL/min (ref >60)
Glucose, Bld: 127 mg/dL — ABNORMAL HIGH (ref 70–99)
Potassium: 4.1 mmol/L (ref 3.5–5.1)
Sodium: 139 mmol/L (ref 135–145)
Total Bilirubin: 0.7 mg/dL (ref 0.3–1.2)
Total Protein: 5.9 g/dL — ABNORMAL LOW (ref 6.5–8.1)

## 2022-02-01 LAB — CBC WITH DIFFERENTIAL/PLATELET
Abs Immature Granulocytes: 0.07 10*3/uL (ref 0.00–0.07)
Basophils Absolute: 0 10*3/uL (ref 0.0–0.1)
Basophils Relative: 0 %
Eosinophils Absolute: 0 10*3/uL (ref 0.0–0.5)
Eosinophils Relative: 0 %
HCT: 34.4 % — ABNORMAL LOW (ref 39.0–52.0)
Hemoglobin: 11.7 g/dL — ABNORMAL LOW (ref 13.0–17.0)
Immature Granulocytes: 0 %
Lymphocytes Relative: 11 %
Lymphs Abs: 2 10*3/uL (ref 0.7–4.0)
MCH: 29.8 pg (ref 26.0–34.0)
MCHC: 34 g/dL (ref 30.0–36.0)
MCV: 87.5 fL (ref 80.0–100.0)
Monocytes Absolute: 1.9 10*3/uL — ABNORMAL HIGH (ref 0.1–1.0)
Monocytes Relative: 10 %
Neutro Abs: 14.2 10*3/uL — ABNORMAL HIGH (ref 1.7–7.7)
Neutrophils Relative %: 79 %
Platelets: 251 10*3/uL (ref 150–400)
RBC: 3.93 MIL/uL — ABNORMAL LOW (ref 4.22–5.81)
RDW: 12.5 % (ref 11.5–15.5)
WBC: 18.1 10*3/uL — ABNORMAL HIGH (ref 4.0–10.5)
nRBC: 0 % (ref 0.0–0.2)

## 2022-02-01 LAB — MAGNESIUM: Magnesium: 2.2 mg/dL (ref 1.7–2.4)

## 2022-02-01 LAB — PHOSPHORUS: Phosphorus: 3.3 mg/dL (ref 2.5–4.6)

## 2022-02-01 MED ORDER — PREDNISONE 10 MG PO TABS: ORAL_TABLET | ORAL | 0 refills | Status: AC

## 2022-02-01 MED ORDER — SIMETHICONE 80 MG PO CHEW
80.0000 mg | CHEWABLE_TABLET | Freq: Once | ORAL | Status: AC
Start: 2022-02-01 — End: 2022-02-01
  Administered 2022-02-01: 80 mg via ORAL
  Filled 2022-02-01: qty 1

## 2022-02-01 MED FILL — Amlodipine Besylate Tab 5 MG (Base Equivalent): ORAL | Qty: 1 | Status: AC

## 2022-02-01 MED FILL — Oxycodone HCl Tab 5 MG: ORAL | Qty: 2 | Status: AC

## 2022-02-01 MED FILL — Hydrochlorothiazide Tab 12.5 MG: ORAL | Qty: 1 | Status: AC

## 2022-02-01 MED FILL — Folic Acid Tab 1 MG: ORAL | Qty: 1 | Status: AC

## 2022-02-01 MED FILL — Methocarbamol Tab 500 MG: ORAL | Qty: 1 | Status: AC

## 2022-02-01 NOTE — Discharge Summary (Signed)
Physician Discharge Summary   Patient: [name] MRN: [id number] DOB: 06-20-1946  Admit date:     01/30/2022  Discharge date: 02/01/22  Discharge Physician: Marylu Lund   PCP: Consuello Closs, MD   Recommendations at discharge:    Follow up with PCP in 1-2 weeks Follow up with Dr. Bobby Rumpf as scheduled  Discharge Diagnoses: Principal Problem:   Septic arthritis [facility]) Active Problems:   Malignant melanoma, metastatic ([place])  Resolved Problems:   * No resolved hospital problems. *  Hospital Course: 76 y.o. male with medical history significant for recently diagnosed metastatic melanoma from a rash located on his left medial calf, recent microlumbar decompression surgery in the lumbar spine 4 days ago, previous CVA, peptic ulcer disease, hypertension, hyperlipidemia who presented to the [facility] ED with multiple complaints including right elbow and right wrist pain.  Joint fluid revealed crystals consistent with pseudogout.  Body fluid culture no growth to date.  The patient received steroid injection into the right elbow at the time of aspiration on 01/30/2022.   Assessment and Plan: No notes have been filed under this hospital service. Service: Hospitalist  Pseudogout flare Joint fluid with intracellular calcium pyrophosphate crystals consistent with pseudogout. Uric acid 5.3 on 01/30/22 The patient received steroid injection into the right elbow at the time of aspiration on 01/30/2022.  Started on IV Solu-Medrol 40 mg daily for pseudogout flare by orthopedic surgery. Initially started on empiric IV antibiotics due to concern for septic arthritis. Antibiotics were later d/c'd with negative cultures Per Orthopedic surgery, recs for prednisone taper on d/c   Newly diagnosed metastatic melanoma Left lower extremity medial calf rash, biopsied, results revealed metastatic melanoma Contrast CT chest abdomen pelvis done for staging MRI brain with and without contrast ordered, follow results Seen by  Dr. Earlie Server, medical oncology for pulmonary malignancy, recs to follow up with Dr. Bobby Rumpf on d/c   Left lower extremity edema, POA Obtain Doppler ultrasound of the left lower extremity neg for DVT, reviewed   Recent back surgery POD #5 Analgesics as needed with bowel regimen   Essential hypertension BP is currently at goal. Continue home home amlodipine       Consultants: Orthopedic Surgery, Oncology Procedures performed:   Disposition: Home Diet recommendation:  Regular diet DISCHARGE MEDICATION: Allergies as of 02/01/2022       Reactions   Morphine Itching   Patient states it is tolerable itching        Medication List     STOP taking these medications    hydrochlorothiazide 12.5 MG tablet Commonly known as: HYDRODIURIL   lisinopril 20 MG tablet Commonly known as: ZESTRIL       TAKE these medications    amLODipine 5 MG tablet Commonly known as: NORVASC Take 5 mg by mouth daily.   docusate sodium 100 MG capsule Commonly known as: Colace Take 1 capsule (100 mg total) by mouth 2 (two) times daily as needed for mild constipation.   famotidine 20 MG tablet Commonly known as: PEPCID Take 20 mg by mouth at bedtime as needed for heartburn or indigestion.   Flaxseed Oil 1000 MG Caps Take 1,000 mg by mouth daily.   folic acid 539 MCG tablet Commonly known as: FOLVITE Take 400 mcg by mouth daily.   gabapentin 100 MG capsule Commonly known as: NEURONTIN Take 300 mg by mouth 3 (three) times daily.   multivitamin with minerals Tabs tablet Take 1 tablet by mouth in the morning.   oxyCODONE 5 MG immediate release tablet  Commonly known as: Oxy IR/ROXICODONE Take 1 tablet (5 mg total) by mouth every 4 (four) hours as needed for severe pain.   polyethylene glycol 17 g packet Commonly known as: MIRALAX / GLYCOLAX Take 17 g by mouth daily.   predniSONE 10 MG tablet Commonly known as: DELTASONE Take 4 tablets (40 mg total) by mouth daily with breakfast  for 2 days, THEN 2 tablets (20 mg total) daily with breakfast for 2 days, THEN 1 tablet (10 mg total) daily with breakfast for 2 days, THEN 0.5 tablets (5 mg total) daily with breakfast for 2 days. Start taking on: February 01, 2022   psyllium 58.6 % powder Commonly known as: METAMUCIL Take 1 packet by mouth daily as needed (constipation).   traZODone 50 MG tablet Commonly known as: DESYREL Take 50 mg by mouth at bedtime as needed for sleep.        Follow-up Information     Consuello Closs, MD Follow up in 2 week(s).   Specialty: Nephrology Why: Hospital follow up Contact information: [place] [id number] [phone]         Follow up with Dr. Bobby Rumpf as scheduled Follow up.   Why: Hospital follow up               Discharge Exam: There were no vitals filed for this visit. General exam: Awake, laying in bed, in nad Respiratory system: Normal respiratory effort, no wheezing Cardiovascular system: regular rate, s1, s2 Gastrointestinal system: Soft, nondistended, positive BS Central nervous system: CN2-12 grossly intact, strength intact Extremities: Perfused, no clubbing Skin: Normal skin turgor, no notable skin lesions seen Psychiatry: Mood normal // no visual hallucinations   Condition at discharge: fair  The results of significant diagnostics from this hospitalization (including imaging, microbiology, ancillary and laboratory) are listed below for reference.   Imaging Studies: MR Brain W Wo Contrast  Result Date: 01/31/2022 CLINICAL DATA:  Skin cancer, staging EXAM: MRI HEAD WITHOUT AND WITH CONTRAST TECHNIQUE: Multiplanar, multiecho pulse sequences of the brain and surrounding structures were obtained without and with intravenous contrast. CONTRAST:  91m GADAVIST GADOBUTROL 1 MMOL/ML IV SOLN COMPARISON:  None Available. FINDINGS: Brain: There is no acute infarction or hemorrhage. Large chronic inferior right cerebellar infarct.  Encephalomalacia/gliosis in the right frontal lobe extending from the calvarium to the right lateral ventricle likely reflects a prior catheter tract. There are some chronic blood products along this tract as well. Additional patchy and confluent areas of T2 hyperintensity in the supratentorial white matter are nonspecific may reflect moderate chronic microvascular ischemic changes. No intracranial mass or mass effect.  No abnormal enhancement. Vascular: Major vessel flow voids at the skull base are preserved. Skull and upper cervical spine: Normal marrow signal is preserved. Sinuses/Orbits: Paranasal sinuses are aerated. Orbits are unremarkable. Other: Sella is unremarkable.  Mastoid air cells are clear. IMPRESSION: No evidence of intracranial metastatic disease. Chronic findings detailed above. Electronically Signed   By: PMacy MisM.D.   On: 01/31/2022 18:47   VAS [place]OWER EXTREMITY VENOUS (DVT)  Result Date: 01/31/2022  Lower Venous DVT Study Patient Name:  VELLIOTT LASECKI Date of Exam:   01/31/2022 Medical Rec #: [id number]  Accession #:    [id number] of Birth: 5February 12, 1947  Patient Gender: M Patient Age:   75years [facility]      VAS [place]OWER EXTREMITY VENOUS (DVT) Referring Phys: CIrene Pap--------------------------------------------------------------------------------  Indications: Edema.  Comparison  Study: 12/16/21, negative for DVT. Performing Technologist: Bobetta Lime  Examination Guidelines: A complete evaluation includes B-mode imaging, spectral Doppler, color Doppler, and power Doppler as needed of all accessible portions of each vessel. Bilateral testing is considered an integral part of a complete examination. Limited examinations for reoccurring indications may be performed as noted. The reflux portion of the exam is performed with the patient in reverse Trendelenburg.  +-----+---------------+---------+-----------+----------+--------------+  RIGHTCompressibilityPhasicitySpontaneityPropertiesThrombus Aging +-----+---------------+---------+-----------+----------+--------------+ CFV  Full           Yes      Yes                                 +-----+---------------+---------+-----------+----------+--------------+   +---------+---------------+---------+-----------+----------+--------------+ LEFT     CompressibilityPhasicitySpontaneityPropertiesThrombus Aging +---------+---------------+---------+-----------+----------+--------------+ CFV      Full           Yes      Yes                                 +---------+---------------+---------+-----------+----------+--------------+ SFJ      Full                                                        +---------+---------------+---------+-----------+----------+--------------+ FV Prox  Full                                                        +---------+---------------+---------+-----------+----------+--------------+ FV Mid   Full                                                        +---------+---------------+---------+-----------+----------+--------------+ FV DistalFull                                                        +---------+---------------+---------+-----------+----------+--------------+ PFV      Full                                                        +---------+---------------+---------+-----------+----------+--------------+ POP      Full           Yes      Yes                                 +---------+---------------+---------+-----------+----------+--------------+ PTV      Full                                                        +---------+---------------+---------+-----------+----------+--------------+  PERO     Full                                                        +---------+---------------+---------+-----------+----------+--------------+     Summary: RIGHT: - No evidence of deep vein thrombosis in  the lower extremity. No indirect evidence of obstruction proximal to the inguinal ligament. - No cystic structure found in the popliteal fossa.  LEFT: - No evidence of common femoral vein obstruction.  *See table(s) above for measurements and observations. Electronically signed by Deitra Mayo MD on 01/31/2022 at 3:15:34 PM.    Final    DG Wrist Complete Right  Result Date: 01/30/2022 CLINICAL DATA:  Evaluate for osteomyelitis. History of pain and swelling at the wrist joint. EXAM: RIGHT WRIST - COMPLETE 3+ VIEW COMPARISON:  None Available. FINDINGS: There is no evidence of fracture or dislocation. There are mild degenerative changes seen and are more prominent at the distal radial intercarpal and carpometacarpal joints. There is small bony excrescence seen at the radial basal aspect of the second metacarpal. Soft tissues are unremarkable. IMPRESSION: Mild degenerative changes and are more prominent at the distal radial intercarpal and carpometacarpal joint with some narrowing of the joint space Electronically Signed   By: Frazier Richards M.D.   On: 01/30/2022 16:26   CT CHEST ABDOMEN PELVIS W CONTRAST  Result Date: 01/30/2022 CLINICAL DATA:  Metastatic disease evaluation. * Tracking Code: BO * EXAM: CT CHEST, ABDOMEN, AND PELVIS WITH CONTRAST TECHNIQUE: Multidetector CT imaging of the chest, abdomen and pelvis was performed following the standard protocol during bolus administration of intravenous contrast. RADIATION DOSE REDUCTION: This exam was performed according to the departmental dose-optimization program which includes automated exposure control, adjustment of the mA and/or kV according to patient size and/or use of iterative reconstruction technique. CONTRAST:  173m OMNIPAQUE IOHEXOL 300 MG/ML  SOLN COMPARISON:  CT chest 11/23/2004 and CT abdomen pelvis 07/07/2018. FINDINGS: CT CHEST FINDINGS Cardiovascular: Atherosclerotic calcification of the aorta, aortic valve and coronary arteries.  Enlarged pulmonic trunk. Heart size normal. No pericardial effusion. Mediastinum/Nodes: No pathologically enlarged mediastinal, hilar or axillary lymph nodes. Esophagus is grossly unremarkable. Lungs/Pleura: There are at least 4 randomly distributed pulmonary nodules in the mid and lower lung zones, measuring up to 9 mm in the lateral segment right middle lobe (6/86). Scattered volume loss in the lung bases. No pleural fluid. Airway is unremarkable. Musculoskeletal: Degenerative changes in the spine. No worrisome lytic or sclerotic lesions. CT ABDOMEN PELVIS FINDINGS Hepatobiliary: Subcentimeter low-attenuation lesion in the dome of the liver is unchanged, indicative of a cyst. Liver and gallbladder are otherwise unremarkable. No biliary ductal dilatation. Pancreas: Negative. Spleen: Negative. Adrenals/Urinary Tract: Adrenal glands are unremarkable. Low-attenuation lesions in the kidneys measure up to 7.0 cm on the left and are indicative of cysts. No follow-up necessary. Ureters are decompressed. Bladder is largely obscured by streak artifact from a right hip arthroplasty. Stomach/Bowel: Partial gastrectomy. Stomach, small bowel, appendix and colon are otherwise unremarkable. Vascular/Lymphatic: Atherosclerotic calcification of the aorta. No pathologically enlarged lymph nodes. Reproductive: Prostate is normal in size. Other: No free fluid. Bilateral inguinal hernias contain fat. Mesenteries and peritoneum are otherwise unremarkable. Musculoskeletal: Right hip arthroplasty. Degenerative changes in the left hip and spine. Postoperative changes in the spine. No worrisome lytic or sclerotic lesions. IMPRESSION: 1. Bilateral pulmonary nodules  are worrisome for metastatic disease. A primary malignancy is not identified in the chest, abdomen or pelvis. 2. Aortic atherosclerosis (ICD10-I70.0). Coronary artery calcification. 3. Enlarged pulmonic trunk, indicative of pulmonary arterial hypertension. Electronically Signed    By: Lorin Picket M.D.   On: 01/30/2022 15:11   DG Elbow Complete Right  Result Date: 01/30/2022 CLINICAL DATA:  Right elbow pain and swelling, history of gout, recent back surgery EXAM: RIGHT ELBOW - COMPLETE 3+ VIEW COMPARISON:  None Available. FINDINGS: No displaced fracture or dislocation of the right elbow. Mild elbow joint arthrosis. Large elbow joint effusion. IMPRESSION: 1. No displaced fracture or dislocation of the right elbow. 2. Large elbow joint effusion. 3. Mild elbow joint arthrosis. No specific evidence of erosive arthropathy given reported history of gout. Electronically Signed   By: Delanna Ahmadi M.D.   On: 01/30/2022 14:48   DG Chest 2 View  Result Date: 01/30/2022 CLINICAL DATA:  Infection. EXAM: CHEST - 2 VIEW COMPARISON:  Chest x-ray November 04, 2008. FINDINGS: Streaky bibasilar subsegmental atelectasis and/or scar. No confluent consolidation. Possible trace bilateral pleural effusions. No visible pneumothorax. Cardiomediastinal silhouette is within normal limits. IMPRESSION: 1. Subsegmental bibasilar atelectasis/scar. 2. Possible trace bilateral pleural effusions. Electronically Signed   By: Margaretha Sheffield M.D.   On: 01/30/2022 11:22   DG Lumbar Spine 2-3 Views  Result Date: 01/26/2022 CLINICAL DATA:  [id number] intraoperative lumbar spine imaging for localization of the intervertebral discs EXAM: LUMBAR SPINE - 2-3 VIEW COMPARISON:  Lumbar spine performed on the same day earlier. FINDINGS: Three cross-table lateral views of the lumbar spine were obtained for interpretation. There are surgical instruments and marker needles seen with tip of the marker needle projecting at the posterior aspect of the L5-S1 disc level in image 2 and 1 of the marker needles at the lamina/disc space at L5-S1 and and second surgical instrument at the level of posterior mid body of L4 vertebral body in the image 3. There has been no significant interval change in the severe lumbar spondylosis. IMPRESSION:  Surgical marking needles as described above Electronically Signed   By: Frazier Richards M.D.   On: 01/26/2022 10:27   DG Lumbar Spine 2-3 Views  Addendum Date: 01/26/2022   ADDENDUM REPORT: 01/26/2022 10:11 ADDENDUM: Please note that this study was performed at 6:15 am on 01/26/2022 not pm as it was initially mentioned on the lumbar spine X-rays. Electronically Signed   By: Frazier Richards M.D.   On: 01/26/2022 10:11   Result Date: 01/26/2022 CLINICAL DATA:  [id number] preoperative study EXAM: LUMBAR SPINE - 2-3 VIEW COMPARISON:  None Available. FINDINGS: Severe lumbar spondylosis with loss of the disc space from T12 through L5. Approximally 4 mm retrolisthesis at L1-L2, 7 mm retrolisthesis at L2-L3, 5 mm anterolisthesis at L4-L5 and 3 mm anterolisthesis at L5-S1. There are prominent marginal osteophytes and endplate sclerotic changes seen. Moderate facet hypertrophic changes as well in the lower lumbar spine. There is no evidence of lumbar spine fracture. IMPRESSION: Severe lumbar spondylosis with loss of the disc space, prominent marginal osteophytes and endplate sclerotic changes. Retrolisthesis and anterolisthesis as above. Electronically Signed: By: Frazier Richards M.D. On: 01/26/2022 08:28    Microbiology: Results for orders placed or performed during the hospital encounter of 01/30/22  Body fluid culture w Gram Stain     Status: None (Preliminary result)   Collection Time: 01/30/22  6:11 PM   Specimen: Synovium; Body Fluid  Result Value Ref Range Status   Specimen Description  Final    SYNOVIAL Performed at [facility], [place] [address], [place], [place] [address]    Special Requests   Final    NONE Performed at [facility], [place] [address], [place], [place] [address]    Gram Stain   Final    MODERATE WBC PRESENT, PREDOMINANTLY PMN NO ORGANISMS SEEN    Culture   Final    NO GROWTH 2 DAYS Performed at [place] [address] [place], [place]  [address]    Report Status PENDING  Incomplete    Labs: CBC: Recent Labs  Lab 01/27/22 0750 01/30/22 1042 01/30/22 1518 01/31/22 0507 02/01/22 0609  WBC 21.3* 18.3* 14.5* 14.9* 18.1*  NEUTROABS  --  15.0*  --  11.9* 14.2*  HGB 12.6* 13.3 10.5* 12.1* 11.7*  HCT 36.6* 38.3* 31.3* 35.6* 34.4*  MCV 88.6 87.0 90.5 88.1 87.5  PLT 220 219 183 218 449   Basic Metabolic Panel: Recent Labs  Lab 01/27/22 0750 01/30/22 1042 01/30/22 1518 01/31/22 0507 02/01/22 0609  NA 133* 131*  --  138 139  K 3.8 3.9  --  4.5 4.1  CL 102 100  --  107 104  CO2 25 22  --  25 27  GLUCOSE 128* 138*  --  138* 127*  BUN 23 19  --  20 34*  CREATININE 1.07 0.91 0.95 0.82 1.07  CALCIUM 8.1* 8.3*  --  8.5* 8.5*  MG  --   --   --  2.4 2.2  PHOS  --   --   --  3.9 3.3   Liver Function Tests: Recent Labs  Lab 01/30/22 1042 01/31/22 0507 02/01/22 0609  AST '16 15 31  '$ ALT '14 14 29  '$ ALKPHOS 48 48 52  BILITOT 2.0* 1.4* 0.7  PROT 6.6 6.0* 5.9*  ALBUMIN 3.4* 3.0* 3.0*   CBG: No results for input(s): "GLUCAP" in the last 168 hours.  Discharge time spent: less than 30 minutes.  Signed: Marylu Lund, MD [facility] 02/01/2022

## 2022-02-01 NOTE — Hospital Course (Signed)
76 y.o. male with medical history significant for recently diagnosed metastatic melanoma from a rash located on his left medial calf, recent microlumbar decompression surgery in the lumbar spine 4 days ago, previous CVA, peptic ulcer disease, hypertension, hyperlipidemia who presented to the [facility] ED with multiple complaints including right elbow and right wrist pain.  Joint fluid revealed crystals consistent with pseudogout.  Body fluid culture no growth to date.  The patient received steroid injection into the right elbow at the time of aspiration on 01/30/2022.

## 2022-02-01 NOTE — Progress Notes (Signed)
  Transition of Care [facility]) Screening Note   Patient Details  Name: [name] Date of Birth: 1945-11-11   Transition of Care [facility]) CM/SW Contact:    Vassie Moselle, LCSW Phone Number: 02/01/2022, 10:01 AM    Transition of Care Department [facility]) has reviewed patient and no TOC needs have been identified at this time. We will continue to monitor patient advancement through interdisciplinary progression rounds. If new patient transition needs arise, please place a TOC consult.

## 2022-02-01 NOTE — Progress Notes (Addendum)
Subjective:    Patient is alert, oriented. Status he had a little nausea overnight but no vomiting. Reports elbow pain and thumb pain to be improved. Continued post-op back pain that is moderate, no change overnight . No other complaints.   Objective:  PE: VITALS:   Vitals:   01/31/22 1130 01/31/22 2057 02/01/22 0216 02/01/22 0543  BP: (!) 132/58 (!) 150/63  133/63  Pulse: 70 77  72  Resp: '17 18  15  '$ Temp: 98.7 F (37.1 C) 98.2 F (36.8 C) 97.9 F (36.6 C) (!) 97.5 F (36.4 C)  TempSrc: Oral Oral Oral Oral  SpO2: 95% 93%  95%   General: sitting up in bed, in no acute distress GI: soft, nontender MSK:  RUE: No TTP to [facility] joint today or olecranon. Mild erythema over olecranon.    Tolerates 20 to 110 degrees of active range of motion at the right elbow, significantly improved from yesterday.  Able to flex and extend right thumb.  Sensation intact all fingers right hand.   Dorsiflexion and plantarflexion intact at bilateral ankles. Calves compressible and nontender. Patient endorses sensation of bilateral feet.  Feet warm and well-perfused.  Low back surgical dressing had rolled up while sleeping overnight, replaced with new clean mepilex this morning.    LABS  Results for orders placed or performed during the hospital encounter of 01/30/22 (from the past 24 hour(s))  CBC with Differential/Platelet     Status: Abnormal   Collection Time: 02/01/22  6:09 AM  Result Value Ref Range   WBC 18.1 (H) 4.0 - 10.5 K/uL   RBC 3.93 (L) 4.22 - 5.81 MIL/uL   Hemoglobin 11.7 (L) 13.0 - 17.0 g/dL   HCT 34.4 (L) 39.0 - 52.0 %   MCV 87.5 80.0 - 100.0 fL   MCH 29.8 26.0 - 34.0 pg   MCHC 34.0 30.0 - 36.0 g/dL   RDW 12.5 11.5 - 15.5 %   Platelets 251 150 - 400 K/uL   nRBC 0.0 0.0 - 0.2 %   Neutrophils Relative % 79 %   Neutro Abs 14.2 (H) 1.7 - 7.7 K/uL   Lymphocytes Relative 11 %   Lymphs Abs 2.0 0.7 - 4.0 K/uL   Monocytes Relative 10 %   Monocytes Absolute 1.9 (H) 0.1 - 1.0 K/uL    Eosinophils Relative 0 %   Eosinophils Absolute 0.0 0.0 - 0.5 K/uL   Basophils Relative 0 %   Basophils Absolute 0.0 0.0 - 0.1 K/uL   Immature Granulocytes 0 %   Abs Immature Granulocytes 0.07 0.00 - 0.07 K/uL  Comprehensive metabolic panel     Status: Abnormal   Collection Time: 02/01/22  6:09 AM  Result Value Ref Range   Sodium 139 135 - 145 mmol/L   Potassium 4.1 3.5 - 5.1 mmol/L   Chloride 104 98 - 111 mmol/L   CO2 27 22 - 32 mmol/L   Glucose, Bld 127 (H) 70 - 99 mg/dL   BUN 34 (H) 8 - 23 mg/dL   Creatinine, Ser 1.07 0.61 - 1.24 mg/dL   Calcium 8.5 (L) 8.9 - 10.3 mg/dL   Total Protein 5.9 (L) 6.5 - 8.1 g/dL   Albumin 3.0 (L) 3.5 - 5.0 g/dL   AST 31 15 - 41 U/L   ALT 29 0 - 44 U/L   Alkaline Phosphatase 52 38 - 126 U/L   Total Bilirubin 0.7 0.3 - 1.2 mg/dL   GFR, Estimated >60 >60 mL/min   Anion  gap 8 5 - 15  Magnesium     Status: None   Collection Time: 02/01/22  6:09 AM  Result Value Ref Range   Magnesium 2.2 1.7 - 2.4 mg/dL  Phosphorus     Status: None   Collection Time: 02/01/22  6:09 AM  Result Value Ref Range   Phosphorus 3.3 2.5 - 4.6 mg/dL    MR Brain W Wo Contrast  Result Date: 01/31/2022 CLINICAL DATA:  Skin cancer, staging EXAM: MRI HEAD WITHOUT AND WITH CONTRAST TECHNIQUE: Multiplanar, multiecho pulse sequences of the brain and surrounding structures were obtained without and with intravenous contrast. CONTRAST:  85m GADAVIST GADOBUTROL 1 MMOL/ML IV SOLN COMPARISON:  None Available. FINDINGS: Brain: There is no acute infarction or hemorrhage. Large chronic inferior right cerebellar infarct. Encephalomalacia/gliosis in the right frontal lobe extending from the calvarium to the right lateral ventricle likely reflects a prior catheter tract. There are some chronic blood products along this tract as well. Additional patchy and confluent areas of T2 hyperintensity in the supratentorial white matter are nonspecific may reflect moderate chronic microvascular ischemic  changes. No intracranial mass or mass effect.  No abnormal enhancement. Vascular: Major vessel flow voids at the skull base are preserved. Skull and upper cervical spine: Normal marrow signal is preserved. Sinuses/Orbits: Paranasal sinuses are aerated. Orbits are unremarkable. Other: Sella is unremarkable.  Mastoid air cells are clear. IMPRESSION: No evidence of intracranial metastatic disease. Chronic findings detailed above. Electronically Signed   By: PMacy MisM.D.   On: 01/31/2022 18:47   VAS [place]OWER EXTREMITY VENOUS (DVT)  Result Date: 01/31/2022  Lower Venous DVT Study Patient Name:  VKAZUMI LACHNEY Date of Exam:   01/31/2022 Medical Rec #: [id number]  Accession #:    [id number] of Birth: [id number]  Patient Gender: M Patient Age:   89years [facility]      VAS [place] EXTREMITY VENOUS (DVT) Referring Phys: CIrene Pap--------------------------------------------------------------------------------  Indications: Edema.  Comparison Study: 12/16/21, negative for DVT. Performing Technologist: JBobetta Lime Examination Guidelines: A complete evaluation includes B-mode imaging, spectral Doppler, color Doppler, and power Doppler as needed of all accessible portions of each vessel. Bilateral testing is considered an integral part of a complete examination. Limited examinations for reoccurring indications may be performed as noted. The reflux portion of the exam is performed with the patient in reverse Trendelenburg.  +-----+---------------+---------+-----------+----------+--------------+ RIGHTCompressibilityPhasicitySpontaneityPropertiesThrombus Aging +-----+---------------+---------+-----------+----------+--------------+ CFV  Full           Yes      Yes                                 +-----+---------------+---------+-----------+----------+--------------+   +---------+---------------+---------+-----------+----------+--------------+ LEFT      CompressibilityPhasicitySpontaneityPropertiesThrombus Aging +---------+---------------+---------+-----------+----------+--------------+ CFV      Full           Yes      Yes                                 +---------+---------------+---------+-----------+----------+--------------+ SFJ      Full                                                        +---------+---------------+---------+-----------+----------+--------------+  FV Prox  Full                                                        +---------+---------------+---------+-----------+----------+--------------+ FV Mid   Full                                                        +---------+---------------+---------+-----------+----------+--------------+ FV DistalFull                                                        +---------+---------------+---------+-----------+----------+--------------+ PFV      Full                                                        +---------+---------------+---------+-----------+----------+--------------+ POP      Full           Yes      Yes                                 +---------+---------------+---------+-----------+----------+--------------+ PTV      Full                                                        +---------+---------------+---------+-----------+----------+--------------+ PERO     Full                                                        +---------+---------------+---------+-----------+----------+--------------+     Summary: RIGHT: - No evidence of deep vein thrombosis in the lower extremity. No indirect evidence of obstruction proximal to the inguinal ligament. - No cystic structure found in the popliteal fossa.  LEFT: - No evidence of common femoral vein obstruction.  *See table(s) above for measurements and observations. Electronically signed by Deitra Mayo MD on 01/31/2022 at 3:15:34 PM.    Final    DG Wrist Complete  Right  Result Date: 01/30/2022 CLINICAL DATA:  Evaluate for osteomyelitis. History of pain and swelling at the wrist joint. EXAM: RIGHT WRIST - COMPLETE 3+ VIEW COMPARISON:  None Available. FINDINGS: There is no evidence of fracture or dislocation. There are mild degenerative changes seen and are more prominent at the distal radial intercarpal and carpometacarpal joints. There is small bony excrescence seen at the radial basal aspect of the second metacarpal. Soft tissues are unremarkable. IMPRESSION: Mild degenerative changes and are more prominent at the distal radial intercarpal and carpometacarpal joint with some narrowing of the joint space  Electronically Signed   By: Frazier Richards M.D.   On: 01/30/2022 16:26   CT CHEST ABDOMEN PELVIS W CONTRAST  Result Date: 01/30/2022 CLINICAL DATA:  Metastatic disease evaluation. * Tracking Code: BO * EXAM: CT CHEST, ABDOMEN, AND PELVIS WITH CONTRAST TECHNIQUE: Multidetector CT imaging of the chest, abdomen and pelvis was performed following the standard protocol during bolus administration of intravenous contrast. RADIATION DOSE REDUCTION: This exam was performed according to the departmental dose-optimization program which includes automated exposure control, adjustment of the mA and/or kV according to patient size and/or use of iterative reconstruction technique. CONTRAST:  167m OMNIPAQUE IOHEXOL 300 MG/ML  SOLN COMPARISON:  CT chest 11/23/2004 and CT abdomen pelvis 07/07/2018. FINDINGS: CT CHEST FINDINGS Cardiovascular: Atherosclerotic calcification of the aorta, aortic valve and coronary arteries. Enlarged pulmonic trunk. Heart size normal. No pericardial effusion. Mediastinum/Nodes: No pathologically enlarged mediastinal, hilar or axillary lymph nodes. Esophagus is grossly unremarkable. Lungs/Pleura: There are at least 4 randomly distributed pulmonary nodules in the mid and lower lung zones, measuring up to 9 mm in the lateral segment right middle lobe (6/86).  Scattered volume loss in the lung bases. No pleural fluid. Airway is unremarkable. Musculoskeletal: Degenerative changes in the spine. No worrisome lytic or sclerotic lesions. CT ABDOMEN PELVIS FINDINGS Hepatobiliary: Subcentimeter low-attenuation lesion in the dome of the liver is unchanged, indicative of a cyst. Liver and gallbladder are otherwise unremarkable. No biliary ductal dilatation. Pancreas: Negative. Spleen: Negative. Adrenals/Urinary Tract: Adrenal glands are unremarkable. Low-attenuation lesions in the kidneys measure up to 7.0 cm on the left and are indicative of cysts. No follow-up necessary. Ureters are decompressed. Bladder is largely obscured by streak artifact from a right hip arthroplasty. Stomach/Bowel: Partial gastrectomy. Stomach, small bowel, appendix and colon are otherwise unremarkable. Vascular/Lymphatic: Atherosclerotic calcification of the aorta. No pathologically enlarged lymph nodes. Reproductive: Prostate is normal in size. Other: No free fluid. Bilateral inguinal hernias contain fat. Mesenteries and peritoneum are otherwise unremarkable. Musculoskeletal: Right hip arthroplasty. Degenerative changes in the left hip and spine. Postoperative changes in the spine. No worrisome lytic or sclerotic lesions. IMPRESSION: 1. Bilateral pulmonary nodules are worrisome for metastatic disease. A primary malignancy is not identified in the chest, abdomen or pelvis. 2. Aortic atherosclerosis (ICD10-I70.0). Coronary artery calcification. 3. Enlarged pulmonic trunk, indicative of pulmonary arterial hypertension. Electronically Signed   By: MLorin PicketM.D.   On: 01/30/2022 15:11   DG Elbow Complete Right  Result Date: 01/30/2022 CLINICAL DATA:  Right elbow pain and swelling, history of gout, recent back surgery EXAM: RIGHT ELBOW - COMPLETE 3+ VIEW COMPARISON:  None Available. FINDINGS: No displaced fracture or dislocation of the right elbow. Mild elbow joint arthrosis. Large elbow joint  effusion. IMPRESSION: 1. No displaced fracture or dislocation of the right elbow. 2. Large elbow joint effusion. 3. Mild elbow joint arthrosis. No specific evidence of erosive arthropathy given reported history of gout. Electronically Signed   By: ADelanna AhmadiM.D.   On: 01/30/2022 14:48   DG Chest 2 View  Result Date: 01/30/2022 CLINICAL DATA:  Infection. EXAM: CHEST - 2 VIEW COMPARISON:  Chest x-ray November 04, 2008. FINDINGS: Streaky bibasilar subsegmental atelectasis and/or scar. No confluent consolidation. Possible trace bilateral pleural effusions. No visible pneumothorax. Cardiomediastinal silhouette is within normal limits. IMPRESSION: 1. Subsegmental bibasilar atelectasis/scar. 2. Possible trace bilateral pleural effusions. Electronically Signed   By: FMargaretha SheffieldM.D.   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

## 2022-02-03 LAB — BODY FLUID CULTURE W GRAM STAIN: Culture: NO GROWTH

## 2022-02-07 ENCOUNTER — Other Ambulatory Visit: Payer: Self-pay | Admitting: Oncology

## 2022-02-07 ENCOUNTER — Other Ambulatory Visit: Payer: Medicare HMO

## 2022-02-07 ENCOUNTER — Encounter: Payer: Self-pay | Admitting: Oncology

## 2022-02-07 ENCOUNTER — Inpatient Hospital Stay: Payer: Medicare HMO | Admitting: Oncology

## 2022-02-07 ENCOUNTER — Telehealth: Payer: Self-pay

## 2022-02-07 ENCOUNTER — Ambulatory Visit: Payer: Self-pay | Admitting: Oncology

## 2022-02-07 ENCOUNTER — Inpatient Hospital Stay: Payer: Medicare HMO | Attending: Oncology

## 2022-02-07 VITALS — BP 161/77 | HR 66 | Temp 97.7°F | Resp 16 | Ht 69.5 in | Wt 230.9 lb

## 2022-02-07 DIAGNOSIS — C439 Malignant melanoma of skin, unspecified: Secondary | ICD-10-CM

## 2022-02-07 DIAGNOSIS — C7989 Secondary malignant neoplasm of other specified sites: Secondary | ICD-10-CM

## 2022-02-07 DIAGNOSIS — M48061 Spinal stenosis, lumbar region without neurogenic claudication: Secondary | ICD-10-CM | POA: Diagnosis not present

## 2022-02-07 DIAGNOSIS — C4372 Malignant melanoma of left lower limb, including hip: Secondary | ICD-10-CM

## 2022-02-07 DIAGNOSIS — R918 Other nonspecific abnormal finding of lung field: Secondary | ICD-10-CM | POA: Insufficient documentation

## 2022-02-07 DIAGNOSIS — M109 Gout, unspecified: Secondary | ICD-10-CM | POA: Diagnosis not present

## 2022-02-07 LAB — HEPATIC FUNCTION PANEL
ALT: 30 U/L (ref 10–40)
AST: 23 (ref 14–40)
Alkaline Phosphatase: 88 (ref 25–125)
Bilirubin, Total: 0.8

## 2022-02-07 LAB — BASIC METABOLIC PANEL
BUN: 26 — AB (ref 4–21)
CO2: 26 — AB (ref 13–22)
Chloride: 104 (ref 99–108)
Creatinine: 1 (ref 0.6–1.3)
Glucose: 111
Potassium: 4.2 mEq/L (ref 3.5–5.1)
Sodium: 140 (ref 137–147)

## 2022-02-07 LAB — COMPREHENSIVE METABOLIC PANEL
Albumin: 4.2 (ref 3.5–5.0)
Calcium: 8.7 (ref 8.7–10.7)

## 2022-02-07 LAB — CBC AND DIFFERENTIAL
HCT: 42 (ref 41–53)
Hemoglobin: 14.1 (ref 13.5–17.5)
Neutrophils Absolute: 10.27
Platelets: 335 10*3/uL (ref 150–400)
WBC: 15.1

## 2022-02-07 LAB — CBC: RBC: 4.77 (ref 3.87–5.11)

## 2022-02-07 LAB — LACTATE DEHYDROGENASE: LDH: 160 U/L (ref 98–192)

## 2022-02-07 NOTE — Telephone Encounter (Signed)
Referral sent to Dr. Carlos Levering office for port placement. Also will send note once completed.

## 2022-02-07 NOTE — Telephone Encounter (Signed)
-----   Message from Derwood Kaplan, MD sent at 02/07/2022  1:22 PM EDT ----- Regarding: refer Pls refer Dr. Coralie Keens for port placement

## 2022-02-09 DIAGNOSIS — Z4889 Encounter for other specified surgical aftercare: Secondary | ICD-10-CM | POA: Diagnosis not present

## 2022-02-13 ENCOUNTER — Other Ambulatory Visit: Payer: Self-pay

## 2022-02-13 DIAGNOSIS — C439 Malignant melanoma of skin, unspecified: Secondary | ICD-10-CM

## 2022-02-14 ENCOUNTER — Telehealth: Payer: Self-pay | Admitting: Genetic Counselor

## 2022-02-15 DIAGNOSIS — C439 Malignant melanoma of skin, unspecified: Secondary | ICD-10-CM | POA: Diagnosis not present

## 2022-02-15 DIAGNOSIS — I700 Atherosclerosis of aorta: Secondary | ICD-10-CM | POA: Diagnosis not present

## 2022-02-15 DIAGNOSIS — R918 Other nonspecific abnormal finding of lung field: Secondary | ICD-10-CM | POA: Diagnosis not present

## 2022-02-15 DIAGNOSIS — R7309 Other abnormal glucose: Secondary | ICD-10-CM | POA: Diagnosis not present

## 2022-02-15 DIAGNOSIS — C4372 Malignant melanoma of left lower limb, including hip: Secondary | ICD-10-CM | POA: Diagnosis not present

## 2022-02-17 ENCOUNTER — Telehealth: Payer: Self-pay | Admitting: Oncology

## 2022-02-17 ENCOUNTER — Encounter: Payer: Self-pay | Admitting: Oncology

## 2022-02-17 NOTE — Telephone Encounter (Signed)
Contacted pt to schedule appts. Unable to reach via phone, voicemail was left.   RE: [place] Received: Pattricia Boss, Chinita Pester, MD  Georgette Shell, RN; Belva Chimes, LPN; Melodye Ped, NP; Naaman Plummer Beverly Gust, [facility]; Neva Seat; 1 other Okay, but I will need appt with him next week to review PET result and go over treatment plan.  Would like for Melissa to do chemo ed that same day. Then we could plan to start him later that week.        Previous Messages    ----- Message -----  From: Georgette Shell, RN  Sent: 02/15/2022   1:47 PM EDT  To: Derwood Kaplan, MD; Belva Chimes, LPN  Subject: Karene Fry with Dr. Venita Sheffield office called to see if February 27, 2022 would be soon enough for port to be placed.  I told them I thought so but would check with you.

## 2022-02-22 ENCOUNTER — Other Ambulatory Visit: Payer: Self-pay

## 2022-02-23 ENCOUNTER — Other Ambulatory Visit: Payer: Self-pay | Admitting: Oncology

## 2022-02-23 DIAGNOSIS — C439 Malignant melanoma of skin, unspecified: Secondary | ICD-10-CM

## 2022-02-23 NOTE — Progress Notes (Deleted)
[place] NOTE Patient Care Team: Consuello Closs, MD as PCP - General (Nephrology) Derwood Kaplan, MD as Consulting Physician (Oncology)   Name of the patient: [name]  [id number]  1945/12/24   Date of visit: 02/23/22  Diagnosis- Melanoma  Chief complaint/Reason for visit- Initial Meeting for [facility], preparing for starting chemotherapy   Heme/Onc history:  Oncology History   No history exists.    Interval history-  Patient presents to chemo care clinic today for initial meeting in preparation for starting chemotherapy. I introduced the chemo care clinic and we discussed that the role of the clinic is to assist those who are at an increased risk of emergency room visits and/or complications during the course of chemotherapy treatment. We discussed that the increased risk takes into account factors such as age, performance status, and co-morbidities. We also discussed that for some, this might include barriers to care such as not having a primary care provider, lack of insurance/transportation, or not being able to afford medications. We discussed that the goal of the program is to help prevent unplanned ER visits and help reduce complications during chemotherapy. We do this by discussing specific risk factors to each individual and identifying ways that we can help improve these risk factors and reduce barriers to care.   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 , 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     Past Surgical History:  Procedure Laterality Date   LUMBAR LAMINECTOMY/DECOMPRESSION MICRODISCECTOMY N/A 01/26/2022   Procedure: Microlumbar decompression Lumbar four-five,  lateral mass fusion with autograft and allograft bone;  Surgeon: Susa Day, MD;  Location: [place];  Service: Orthopedics;  Laterality: N/A;   PARTIAL GASTRECTOMY     TOTAL HIP ARTHROPLASTY Right 06/18/2018   Procedure: RIGHT TOTAL HIP ARTHROPLASTY ANTERIOR APPROACH;  Surgeon: [name], MD;  Location: [facility];  Service: Orthopedics;  Laterality: Right;    Social History   Socioeconomic History   Marital status: Married    Spouse name: Ann   Number of children: 2   Years of education: 12   Highest education level: 12th grade  Occupational History   Not on file  Tobacco Use   Smoking status: Former   Smokeless tobacco: Never   Tobacco comments:    quit 40 years ago   Vaping Use   Vaping Use: Never used  Substance and Sexual Activity   Alcohol use: Yes    Comment: moderate  ; 2-3 beers each weekend    Drug use: Never   Sexual activity: Not Currently  Other Topics Concern   Not on file  Social History Narrative   Not on file   Social Determinants of Health   Financial Resource Strain: Not on file  Food Insecurity: Not on file  Transportation Needs: Not on file  Physical Activity: Not on file  Stress: Not on file  Social Connections: Not on file  Intimate Partner Violence: Not on file    Family History  Problem Relation Age of Onset   Melanoma Brother      Current Outpatient Medications:  amLODipine (NORVASC) 5 MG tablet, Take 5 mg by mouth daily., Disp: , Rfl:    aspirin EC 81 MG tablet, Take 1 tablet by mouth daily., Disp: , Rfl:    clopidogrel (PLAVIX) 75 MG tablet, Take 1 tablet by mouth daily., Disp: , Rfl:    docusate sodium (COLACE) 100 MG capsule, Take 1 capsule (100 mg total) by mouth 2 (two) times daily as needed for mild constipation., Disp: 30 capsule, Rfl: 1   famciclovir (FAMVIR) 500 MG tablet, TAKE 1 TABLET BY MOUTH THREE TIMES DAILY FOR 7 DAYS FOR SHINGLES, Disp: , Rfl:    famotidine (PEPCID) 20 MG tablet, Take 20 mg by mouth at bedtime as  needed for heartburn or indigestion., Disp: , Rfl:    Flaxseed, Linseed, (FLAXSEED OIL) 1000 MG CAPS, Take 1,000 mg by mouth daily., Disp: , Rfl:    folic acid (FOLVITE) 132 MCG tablet, Take 400 mcg by mouth daily., Disp: , Rfl:    gabapentin (NEURONTIN) 100 MG capsule, Take 300 mg by mouth 3 (three) times daily., Disp: , Rfl:    gabapentin (NEURONTIN) 100 MG capsule, Take by mouth., Disp: , Rfl:    gabapentin (NEURONTIN) 300 MG capsule, TAKE 1 CAPSULE BY MOUTH THREE TIMES DAILY AS NEEDED, Disp: , Rfl:    hydrochlorothiazide (HYDRODIURIL) 12.5 MG tablet, Take 12.5 mg by mouth daily., Disp: , Rfl:    lisinopril (ZESTRIL) 20 MG tablet, , Disp: , Rfl:    methocarbamol (ROBAXIN) 500 MG tablet, , Disp: , Rfl:    Multiple Vitamin (MULTIVITAMIN WITH MINERALS) TABS tablet, Take 1 tablet by mouth in the morning., Disp: , Rfl:    Omega-3 1000 MG CAPS, Take by mouth., Disp: , Rfl:    oxyCODONE (OXY IR/ROXICODONE) 5 MG immediate release tablet, Take 1 tablet (5 mg total) by mouth every 4 (four) hours as needed for severe pain., Disp: 40 tablet, Rfl: 0   polyethylene glycol (MIRALAX / GLYCOLAX) 17 g packet, Take 17 g by mouth daily., Disp: 14 each, Rfl: 0   predniSONE (DELTASONE) 10 MG tablet, , Disp: , Rfl:    psyllium (METAMUCIL) 58.6 % powder, Take 1 packet by mouth daily as needed (constipation)., Disp: , Rfl:    traZODone (DESYREL) 50 MG tablet, Take 50 mg by mouth at bedtime as needed for sleep., Disp: , Rfl:      Latest Ref Rng & Units 02/07/2022   12:00 AM  CMP  BUN 4 - 21 26      Creatinine 0.6 - 1.3 1.0      Sodium 137 - 147 140      Potassium 3.5 - 5.1 mEq/L 4.2      Chloride 99 - 108 104      CO2 13 - 22 26      Calcium 8.7 - 10.7 8.7      Alkaline Phos 25 - 125 88      AST 14 - 40 23      ALT 10 - 40 U/L 30         This result is from an external source.      Latest Ref Rng & Units 02/07/2022   12:00 AM  CBC  WBC  15.1      Hemoglobin 13.5 - 17.5 14.1      Hematocrit 41 - 53 42       Platelets 150 - 400 K/uL 335         This result is from an external source.  No images are attached to the encounter.  MR Brain W Wo Contrast  Result Date: 01/31/2022 CLINICAL DATA:  Skin cancer, staging EXAM: MRI HEAD WITHOUT AND WITH CONTRAST TECHNIQUE: Multiplanar, multiecho pulse sequences of the brain and surrounding structures were obtained without and with intravenous contrast. CONTRAST:  66m GADAVIST GADOBUTROL 1 MMOL/ML IV SOLN COMPARISON:  None Available. FINDINGS: Brain: There is no acute infarction or hemorrhage. Large chronic inferior right cerebellar infarct. Encephalomalacia/gliosis in the right frontal lobe extending from the calvarium to the right lateral ventricle likely reflects a prior catheter tract. There are some chronic blood products along this tract as well. Additional patchy and confluent areas of T2 hyperintensity in the supratentorial white matter are nonspecific may reflect moderate chronic microvascular ischemic changes. No intracranial mass or mass effect.  No abnormal enhancement. Vascular: Major vessel flow voids at the skull base are preserved. Skull and upper cervical spine: Normal marrow signal is preserved. Sinuses/Orbits: Paranasal sinuses are aerated. Orbits are unremarkable. Other: Sella is unremarkable.  Mastoid air cells are clear. IMPRESSION: No evidence of intracranial metastatic disease. Chronic findings detailed above. Electronically Signed   By: PMacy MisM.D.   On: 01/31/2022 18:47   VAS [place]OWER EXTREMITY VENOUS (DVT)  Result Date: 01/31/2022  Lower Venous DVT Study Patient Name:  VBASTION BOLGER Date of Exam:   01/31/2022 Medical Rec #: [id number]  Accession #:    [id number] of Birth: [birth year]  Patient Gender: M Patient Age:   89years [facility] [place] EXTREMITY VENOUS (DVT) Referring Phys: CIrene Pap--------------------------------------------------------------------------------   Indications: Edema.  Comparison Study: 12/16/21, negative for DVT. Performing Technologist: JBobetta Lime Examination Guidelines: A complete evaluation includes B-mode imaging, spectral Doppler, color Doppler, and power Doppler as needed of all accessible portions of each vessel. Bilateral testing is considered an integral part of a complete examination. Limited examinations for reoccurring indications may be performed as noted. The reflux portion of the exam is performed with the patient in reverse Trendelenburg.  +-----+---------------+---------+-----------+----------+--------------+ RIGHTCompressibilityPhasicitySpontaneityPropertiesThrombus Aging +-----+---------------+---------+-----------+----------+--------------+ CFV  Full           Yes      Yes                                 +-----+---------------+---------+-----------+----------+--------------+   +---------+---------------+---------+-----------+----------+--------------+ LEFT     CompressibilityPhasicitySpontaneityPropertiesThrombus Aging +---------+---------------+---------+-----------+----------+--------------+ CFV      Full           Yes      Yes                                 +---------+---------------+---------+-----------+----------+--------------+ SFJ      Full                                                        +---------+---------------+---------+-----------+----------+--------------+ FV Prox  Full                                                        +---------+---------------+---------+-----------+----------+--------------+  FV Mid   Full                                                        +---------+---------------+---------+-----------+----------+--------------+ FV DistalFull                                                        +---------+---------------+---------+-----------+----------+--------------+ PFV      Full                                                         +---------+---------------+---------+-----------+----------+--------------+ POP      Full           Yes      Yes                                 +---------+---------------+---------+-----------+----------+--------------+ PTV      Full                                                        +---------+---------------+---------+-----------+----------+--------------+ PERO     Full                                                        +---------+---------------+---------+-----------+----------+--------------+     Summary: RIGHT: - No evidence of deep vein thrombosis in the lower extremity. No indirect evidence of obstruction proximal to the inguinal ligament. - No cystic structure found in the popliteal fossa.  LEFT: - No evidence of common femoral vein obstruction.  *See table(s) above for measurements and observations. Electronically signed by Deitra Mayo MD on 01/31/2022 at 3:15:34 PM.    Final    DG Wrist Complete Right  Result Date: 01/30/2022 CLINICAL DATA:  Evaluate for osteomyelitis. History of pain and swelling at the wrist joint. EXAM: RIGHT WRIST - COMPLETE 3+ VIEW COMPARISON:  None Available. FINDINGS: There is no evidence of fracture or dislocation. There are mild degenerative changes seen and are more prominent at the distal radial intercarpal and carpometacarpal joints. There is small bony excrescence seen at the radial basal aspect of the second metacarpal. Soft tissues are unremarkable. IMPRESSION: Mild degenerative changes and are more prominent at the distal radial intercarpal and carpometacarpal joint with some narrowing of the joint space Electronically Signed   By: Frazier Richards M.D.   On: 01/30/2022 16:26   CT CHEST ABDOMEN PELVIS W CONTRAST  Result Date: 01/30/2022 CLINICAL DATA:  Metastatic disease evaluation. * Tracking Code: BO * EXAM: CT CHEST, ABDOMEN, AND PELVIS WITH CONTRAST TECHNIQUE: Multidetector CT imaging of the chest, abdomen and pelvis was  performed following the  standard protocol during bolus administration of intravenous contrast. RADIATION DOSE REDUCTION: This exam was performed according to the departmental dose-optimization program which includes automated exposure control, adjustment of the mA and/or kV according to patient size and/or use of iterative reconstruction technique. CONTRAST:  126m OMNIPAQUE IOHEXOL 300 MG/ML  SOLN COMPARISON:  CT chest 11/23/2004 and CT abdomen pelvis 07/07/2018. FINDINGS: CT CHEST FINDINGS Cardiovascular: Atherosclerotic calcification of the aorta, aortic valve and coronary arteries. Enlarged pulmonic trunk. Heart size normal. No pericardial effusion. Mediastinum/Nodes: No pathologically enlarged mediastinal, hilar or axillary lymph nodes. Esophagus is grossly unremarkable. Lungs/Pleura: There are at least 4 randomly distributed pulmonary nodules in the mid and lower lung zones, measuring up to 9 mm in the lateral segment right middle lobe (6/86). Scattered volume loss in the lung bases. No pleural fluid. Airway is unremarkable. Musculoskeletal: Degenerative changes in the spine. No worrisome lytic or sclerotic lesions. CT ABDOMEN PELVIS FINDINGS Hepatobiliary: Subcentimeter low-attenuation lesion in the dome of the liver is unchanged, indicative of a cyst. Liver and gallbladder are otherwise unremarkable. No biliary ductal dilatation. Pancreas: Negative. Spleen: Negative. Adrenals/Urinary Tract: Adrenal glands are unremarkable. Low-attenuation lesions in the kidneys measure up to 7.0 cm on the left and are indicative of cysts. No follow-up necessary. Ureters are decompressed. Bladder is largely obscured by streak artifact from a right hip arthroplasty. Stomach/Bowel: Partial gastrectomy. Stomach, small bowel, appendix and colon are otherwise unremarkable. Vascular/Lymphatic: Atherosclerotic calcification of the aorta. No pathologically enlarged lymph nodes. Reproductive: Prostate is normal in size. Other: No free  fluid. Bilateral inguinal hernias contain fat. Mesenteries and peritoneum are otherwise unremarkable. Musculoskeletal: Right hip arthroplasty. Degenerative changes in the left hip and spine. Postoperative changes in the spine. No worrisome lytic or sclerotic lesions. IMPRESSION: 1. Bilateral pulmonary nodules are worrisome for metastatic disease. A primary malignancy is not identified in the chest, abdomen or pelvis. 2. Aortic atherosclerosis (ICD10-I70.0). Coronary artery calcification. 3. Enlarged pulmonic trunk, indicative of pulmonary arterial hypertension. Electronically Signed   By: MLorin PicketM.D.   On: 01/30/2022 15:11   DG Elbow Complete Right  Result Date: 01/30/2022 CLINICAL DATA:  Right elbow pain and swelling, history of gout, recent back surgery EXAM: RIGHT ELBOW - COMPLETE 3+ VIEW COMPARISON:  None Available. FINDINGS: No displaced fracture or dislocation of the right elbow. Mild elbow joint arthrosis. Large elbow joint effusion. IMPRESSION: 1. No displaced fracture or dislocation of the right elbow. 2. Large elbow joint effusion. 3. Mild elbow joint arthrosis. No specific evidence of erosive arthropathy given reported history of gout. Electronically Signed   By: ADelanna AhmadiM.D.   On: 01/30/2022 14:48   DG Chest 2 View  Result Date: 01/30/2022 CLINICAL DATA:  Infection. EXAM: CHEST - 2 VIEW COMPARISON:  Chest x-ray November 04, 2008. FINDINGS: Streaky bibasilar subsegmental atelectasis and/or scar. No confluent consolidation. Possible trace bilateral pleural effusions. No visible pneumothorax. Cardiomediastinal silhouette is within normal limits. IMPRESSION: 1. Subsegmental bibasilar atelectasis/scar. 2. Possible trace bilateral pleural effusions. Electronically Signed   By: FMargaretha SheffieldM.D.   On: 01/30/2022 11:22   DG Lumbar Spine 2-3 Views  Result Date: 01/26/2022 CLINICAL DATA:  [id number] lumbar spine imaging for localization of the intervertebral discs EXAM:  LUMBAR SPINE - 2-3 VIEW COMPARISON:  Lumbar spine performed on the same day earlier. FINDINGS: Three cross-table lateral views of the lumbar spine were obtained for interpretation. There are surgical instruments and marker needles seen with tip of the marker needle projecting at the posterior aspect of the  L5-S1 disc level in image 2 and 1 of the marker needles at the lamina/disc space at L5-S1 and and second surgical instrument at the level of posterior mid body of L4 vertebral body in the image 3. There has been no significant interval change in the severe lumbar spondylosis. IMPRESSION: Surgical marking needles as described above Electronically Signed   By: Frazier Richards M.D.   On: 01/26/2022 10:27   DG Lumbar Spine 2-3 Views  Addendum Date: 01/26/2022   ADDENDUM REPORT: 01/26/2022 10:11 ADDENDUM: Please note that this study was performed at 6:15 am on 01/26/2022 not pm as it was initially mentioned on the lumbar spine X-rays. Electronically Signed   By: Frazier Richards M.D.   On: 01/26/2022 10:11   Result Date: 01/26/2022 CLINICAL DATA:  [id number] preoperative study EXAM: LUMBAR SPINE - 2-3 VIEW COMPARISON:  None Available. FINDINGS: Severe lumbar spondylosis with loss of the disc space from T12 through L5. Approximally 4 mm retrolisthesis at L1-L2, 7 mm retrolisthesis at L2-L3, 5 mm anterolisthesis at L4-L5 and 3 mm anterolisthesis at L5-S1. There are prominent marginal osteophytes and endplate sclerotic changes seen. Moderate facet hypertrophic changes as well in the lower lumbar spine. There is no evidence of lumbar spine fracture. IMPRESSION: Severe lumbar spondylosis with loss of the disc space, prominent marginal osteophytes and endplate sclerotic changes. Retrolisthesis and anterolisthesis as above. Electronically Signed: By: Frazier Richards M.D. On: 01/26/2022 08:28     Assessment and plan- Patient is a 76 y.o. male who presents to [facility] for initial meeting in preparation for starting  chemotherapy for the treatment of ***.   Chemo Care Clinic/High Risk for ER/Hospitalization during chemotherapy- We discussed the role of the chemo care clinic and identified patient specific risk factors. 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 , 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     Patient has past surgical history positive for: Past Surgical History:  Procedure Laterality Date   LUMBAR LAMINECTOMY/DECOMPRESSION MICRODISCECTOMY N/A 01/26/2022   Procedure: Microlumbar decompression Lumbar four-five, lateral mass fusion with autograft and allograft bone;  Surgeon: Susa Day, MD;  Location: [place];  Service: Orthopedics;  Laterality: N/A;   PARTIAL GASTRECTOMY     TOTAL HIP ARTHROPLASTY Right 06/18/2018   Procedure: RIGHT TOTAL HIP ARTHROPLASTY ANTERIOR APPROACH;  Surgeon: [name], MD;  Location: [facility];  Service: Orthopedics;  Laterality: Right;   The patient is a with newly diagnosed.  Patient presents to clinic today for chemotherapy education and palliative care consult.  We will start.  We will send in prescriptions for prochlorperazine and ondansetron.  The patient verbalizes understanding of and agreement to the plan as discussed today.  Provided general information including the following: 1.  Date of education: 02/24/2022 2.  Physician name: Dr. Hinton Rao 3.  Diagnosis: Melanoma 4.  Stage: 5.  Curative or palliative 6.  Chemotherapy plan including drugs and how often: Ipilimumab/ Nivolumab 7.  Start date: 92.  Other referrals: None at this time 9.  The patient is to call our office with any questions or concerns.  Our office number [phone], if after hours or  on  the weekend, call the same number and wait for the answering service.  There is always an oncologist on call 10.  Medications prescribed: Ondansetron, Prochlorperazine 11.  The patient has verbalized understanding of the treatment plan and has no barriers to adherence or understanding.  Obtained signed consent from patient.  Discussed symptoms including 1.  Low blood counts including red blood cells, white blood cells and platelets. 2. Infection including to avoid large crowds, wash hands frequently, and stay away from people who were sick.  If fever develops of 100.4 or higher, call our office. 3.  Mucositis-given instructions on mouth rinse (baking soda and salt mixture).  Keep mouth clean.  Use soft bristle toothbrush.  If mouth sores develop, call our clinic. 4.  Nausea/vomiting-gave prescriptions for ondansetron 4 mg every 4 hours as needed for nausea, may take around the clock if persistent.  Compazine 10 mg every 6 hours, may take around the clock if persistent. 5.  Diarrhea-use over-the-counter Imodium.  Call clinic if not controlled. 6.  Constipation-use senna, 1 to 2 tablets twice a day.  If no BM in 2 to 3 days call the clinic. 7.  Loss of appetite-try to eat small meals every 2-3 hours.  Call clinic if not eating. 8.  Taste changes-zinc 500 mg daily.  If becomes severe call clinic. 9.  Alcoholic beverages. 10.  Drink 2 to 3 quarts of water per day. 11.  Peripheral neuropathy-patient to call if numbness or tingling in hands or feet is persistent  Neulasta-will be given 24 to 48 hours after chemotherapy.  Gave information sheet on bone and joint pain.  Use Claritin or Pepcid.  May use ibuprofen or Aleve.  Call if symptoms persist or are unbearable.  Gave information on the supportive care team and how to contact them regarding services.  Discussed advanced directives.  The patient does not have their advanced directives but will look at the copy provided in their notebook and will call  with any questions. Spiritual Nutrition Financial Social worker Advanced directives  Answered questions to patient satisfaction.  Patient is to call with any further questions or concerns.   The medication prescribed to the patient will be printed out from [place] references This will give the following information: Name of your medication Approved uses Dose and schedule Storage and handling Handling body fluids and waste Drug and food interactions Possible side effects and management Pregnancy, sexual activity, and contraception Obtaining medication   We discussed that social determinants of health may have significant impacts on health and outcomes for cancer patients.  Today we discussed specific social determinants of performance status, alcohol use, depression, financial needs, food insecurity, housing, interpersonal violence, social connections, stress, tobacco use, and transportation.    After lengthy discussion the following were identified as areas of need:   Outpatient services: We discussed options including home based and outpatient services, DME and care program. We discusssed that patients who participate in regular physical activity report fewer negative impacts of cancer and treatments and report less fatigue.   Financial Concerns: We discussed that living with cancer can create tremendous financial burden.  We discussed options for assistance. I asked that if assistance is needed in affording medications or paying bills to please let us know so that we can provide assistance. We discussed options for food including social services and onsite food pantry.  We will also notify Mort Sawyers to see if cancer center can provide additional support.  Referral to Social work:  Introduced Education officer, museum Mort Sawyers and the services she can provide such as support with utility bill, cell phone and gas vouchers.   Support groups: We discussed options for support groups at the  cancer center. If interested, please notify nurse navigator to enroll. We discussed options for managing stress including healthy eating, exercise as well as participating in no charge counseling services at the cancer center and support groups.  If these are of interest, patient can notify either myself or primary nursing team.We discussed options for management including medications and referral to quit Smart program  Transportation: We discussed options for transportation.  I have notified primary oncology team who will help assist with arranging sandeep transportation for appointments when/if needed. We also discussed options for transportation on short notice/acute visits.  Palliative care services: We have palliative care services available in the cancer center to discuss goals of care and advanced care planning.  Please let us know if you have any questions or would like to speak to our palliative nurse practitioner.  Symptom Management Clinic: We discussed our symptom management clinic which is available for acute concerns while receiving treatment such as nausea, vomiting or diarrhea.  We can be reached via telephone at [phone] or through my chart.  We are available for virtual or in person visits on the same day from 830 to 4 PM Monday through Friday. He denies needing specific assistance at this time and He will be followed by Dr. [name] clinical team.  Plan: Discussed symptom management clinic. Discussed palliative care services. Discussed resources that are available here at the cancer center. Discussed medications and new prescriptions to begin treatment such as anti-nausea or steroids.   Disposition: RTC on   Visit Diagnosis No diagnosis found.  Patient expressed understanding and was in agreement with this plan. He also understands that He can call clinic at any time with any questions, concerns, or complaints.   I provided 30 minutes of  face to face  during this encounter, and >  50% was spent counseling as documented under my assessment & plan.   Dayton Scrape, FNP-BC

## 2022-02-23 NOTE — Progress Notes (Signed)
[facility]  [address] [place],  [place]  [address] [phone]  Clinic Day:  02/24/22  Referring physician: Consuello Closs, MD   ASSESSMENT & PLAN:   Malignant melanoma of the left foot He has had a punch biopsy and this reveals a Breslow thickness of 1.6 mm and Clark's level 4 with ulceration for a T2b N0 M0 clinically.  We now find no distant metastasis on PET scan.  I will request BRAF testing on his new pathology.  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  Multiple lesions of the lung These are quite suspicious for metastatic disease.  The PET scan is negative but we cannot absolutely rule out metastatic disease since these lesions are small.  Spinal stenosis at L4-5 He has had surgical decompression and resection of a synovial cyst as of June 8, 2 weeks ago.  Gouty arthritis He had an acute flare last week involving primarily his elbow.  Orders Placed This Encounter  Procedures   CBC and differential    This external order was created through the Results Console.   CBC    This external order was created through the Results Console.   Basic metabolic panel    This external order was created through the Results Console.   Comprehensive metabolic panel    This external order was created through the Results Console.   Hepatic function panel    This external order was created through the Results Console.   PET scanning has been done in order to complete his staging.  The lung lesions are suspicious for metastatic disease, but too small to be sure since the largest is only 9 mm and has an SUV of 2.2.  MRI of the brain was negative.  Even if the lung is negative and he is able to have surgical resection, he still would require adjuvant therapy.  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  I will therefore asked Dr. Lovie Macadamia to place a port for venous access and I have explained to the patient we will recommend immunotherapy after the staging is completed.  I have asked Dr. Laurance Flatten had to do an aggressive surgery on his foot and left calf.  I will also have testing for BRAF with a [employer] melanoma panel.  I explained the fact that he will need to be off corticosteroids to receive immunotherapy, and he has nearly tapered off of that.  I reviewed the schedule and potential toxicities of this treatment.  I think it would be difficult to biopsy one of the lung lesions to document that this is metastatic melanoma due to the size and the fact that we could not trust a negative result..  These lesions were not there on a scan in 2006 and appear highly likely to be metastases.  I discussed the assessment and treatment plan with the patient.  The patient and his family were provided an opportunity to ask questions and all were answered.  The patient agreed with the plan and demonstrated an understanding of the instructions.  The patient was advised to call back if the symptoms worsen or if the condition fails to improve as anticipated.   I provided 20 minutes of face-to-face time during this this encounter and > 50% was spent counseling as documented under my assessment and  plan.    Derwood Kaplan, MD [facility] [address] [place] [address] Dept: [phone] Dept Fax: [phone]   CHIEF COMPLAINT:  CC: Malignant melanoma  Current Treatment: Evaluation and immunotherapy   HISTORY OF PRESENT ILLNESS:  [name] is a 76 y.o. male with a history of malignant melanoma who is referred in consultation with Dr. Bertram Millard for assessment and management.  He had a lesion of the dorsum of the left foot in September 2022 which appeared to be a granuloma and was treated in the  office.  He later developed a another lesion lateral to this on the dorsum of the left foot which was not hyperpigmented but did bleed easily.  He also has several lesions of his left medial calf which are nodular and span approximately 6 cm.  He has had some increased edema of the left lower leg.  He was evaluated by dermatology on June 5, who felt this could be a squamous cell carcinoma versus tinea or a drug reaction.  He had biopsies done of both the foot lesion and the Lesion.  Pathology came back that the left dorsal foot shave biopsy revealed a malignant melanoma possibly a primary nodular ulcerated melanoma which had a Breslow thickness of at least 1.6 mm and a Clark's level 4.  Margins are positive as this was a shave biopsy.  Ulceration is present but no satellitosis.  The mitotic index is 4/mm and no lymphovascular invasion was identified.  No brisk tumor infiltrating lymphocytes are seen and tumor regression is absent.  This is felt to be at least a T2b lesion and wide excision was recommended.  The lesion of the left medial calf revealed a dermal map melanoma most consistent with metastatic disease.  This information was not available when the patient was admitted for back surgery on June 8 and he had a microlumbar decompression at L4-5 for spinal stenosis.  He was also found to have a synovial cyst at that time which was resected and benign.  He was readmitted several days later for acute pain of the right elbow and both arms.  Aspiration was obtained and this was diagnosed as gouty arthritis.  He did have temporary rehab after his back surgery.  His left leg was evaluated for deep venous thrombosis and was negative.  While he was recuperating from his surgery he had an x-ray which was abnormal which led to a CT scan which revealed at least 3 lung lesions.  MRI of the brain was negative and he is now referred for management of the malignant melanoma.  INTERVAL HISTORY:  I have reviewed his chart  and materials related to his cancer extensively and collaborated history with the patient. Summary of oncologic history is as follows: Oncology History   No history exists.    Abbott is seen in the clinic for follow up of his malignant melanoma.  He continues to have edema of the left leg.  Since the pet is appears negative for distant metastases, I have discussed aggressive surgery with Dr. Lovie Macadamia, still to be followed by adjuvant immunotherapy.  Surgery is scheduled for July 11.  He says he may have had some intentional weight loss of a few pounds but his appetite is okay.  His labs today are unremarkable other than a BUN of 32.  He is also on prednisone in a tapering course and is down to 5 mg and will be stopping  that soon.  He does admit to occasional cough but denies hemoptysis, chest pain, dyspnea, or wheezing. He denies fever, chills, night sweats, or other signs of infection. He denies cardiorespiratory and gastrointestinal issues.  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  Past Surgical History:  Procedure Laterality Date   LUMBAR LAMINECTOMY/DECOMPRESSION MICRODISCECTOMY N/A 01/26/2022   Procedure: Microlumbar decompression Lumbar four-five, lateral mass fusion with autograft and allograft bone;  Surgeon: Susa Day, MD;  Location: [facility];  Service: Orthopedics;  Laterality: N/A;   PARTIAL GASTRECTOMY     TOTAL HIP ARTHROPLASTY Right 06/18/2018   Procedure: RIGHT TOTAL HIP ARTHROPLASTY ANTERIOR APPROACH;  Surgeon: [name], MD;  Location: [facility];  Service: Orthopedics;  Laterality: Right;    Family History  Problem Relation Age  of Onset   Melanoma Brother     Social History:  reports that he has quit smoking. He has never used smokeless tobacco. He reports current alcohol use. He reports that he does not use drugs.The patient is married and accompanied by his wife today.  His daughter-in-law Freida Busman is also here today.  They drink about 1 glass of wine daily.  They have a son and daughter.  He is retired from working with [employer] and did have chemical exposures with that.  Allergies:  Allergies  Allergen Reactions   Morphine Itching    Patient states it is tolerable itching    Current Medications: Current Outpatient Medications  Medication Sig Dispense Refill   amLODipine (NORVASC) 5 MG tablet Take 5 mg by mouth daily.     aspirin EC 81 MG tablet Take 1 tablet by mouth daily.     clopidogrel (PLAVIX) 75 MG tablet Take 1 tablet by mouth daily.     docusate sodium (COLACE) 100 MG capsule Take 1 capsule (100 mg total) by mouth 2 (two) times daily as needed for mild constipation. 30 capsule 1   famotidine (PEPCID) 20 MG tablet Take 20 mg by mouth at bedtime as needed for heartburn or indigestion.     Flaxseed, Linseed, (FLAXSEED OIL) 1000 MG CAPS Take 1,000 mg by mouth daily.     folic acid (FOLVITE) 712 MCG tablet Take 400 mcg by mouth daily.     gabapentin (NEURONTIN) 100 MG capsule Take 300 mg by mouth 3 (three) times daily.     hydrochlorothiazide (HYDRODIURIL) 12.5 MG tablet Take 12.5 mg by mouth daily.     lisinopril (ZESTRIL) 20 MG tablet      Multiple Vitamin (MULTIVITAMIN WITH MINERALS) TABS tablet Take 1 tablet by mouth in the morning.     Omega-3 1000 MG CAPS Take by mouth.     polyethylene glycol (MIRALAX / GLYCOLAX) 17 g packet Take 17 g by mouth daily. 14 each 0   pravastatin (PRAVACHOL) 40 MG tablet Take 40 mg by mouth daily.     traZODone (DESYREL) 50 MG tablet Take 50 mg by mouth at bedtime as needed for sleep.     No current facility-administered medications for this visit.    REVIEW OF  SYSTEMS:  Review  of Systems  HENT:  Negative.    Eyes: Negative.   Cardiovascular:  Negative for leg swelling.  Gastrointestinal: Negative.   Endocrine: Negative.   Genitourinary: Negative.    Musculoskeletal:  Positive for arthralgias and back pain.  Skin:  Positive for wound.       He has a lesion on the dorsum of the left foot as well as some nodules of the medial left calf.  Neurological: Negative.   Hematological: Negative.   Psychiatric/Behavioral: Negative.        VITALS:  Blood pressure (!) 142/73, pulse 88, temperature 98 F (36.7 C), temperature source Oral, resp. rate 16, height 5' 9.5" (1.765 m), weight 232 lb 4.8 oz (105.4 kg), SpO2 93 %.  Wt Readings from Last 3 Encounters:  02/24/22 232 lb 4.8 oz (105.4 kg)  02/07/22 230 lb 14.4 oz (104.7 kg)  01/26/22 230 lb (104.3 kg)    Body mass index is 33.81 kg/m.  Performance status (ECOG): 1 - Symptomatic but completely ambulatory  PHYSICAL EXAM:  Physical Exam Constitutional:      Appearance: Normal appearance.  HENT:     Head: Normocephalic and atraumatic.     Nose: Nose normal.     Mouth/Throat:     Pharynx: Oropharynx is clear.  Eyes:     Extraocular Movements: Extraocular movements intact.     Conjunctiva/sclera: Conjunctivae normal.     Pupils: Pupils are equal, round, and reactive to light.  Cardiovascular:     Rate and Rhythm: Normal rate and regular rhythm.     Heart sounds: Normal heart sounds.  Pulmonary:     Breath sounds: Normal breath sounds.  Abdominal:     General: Bowel sounds are normal.     Palpations: Abdomen is soft.  Musculoskeletal:     Cervical back: Normal range of motion.     Left lower leg: Edema present.     Comments: He has a large back brace in place.  Skin:    General: Skin is warm and dry.     Findings: Lesion present.     Comments: He has a scaly excoriated lesion of the lateral dorsum of the left foot.  This appears ulcerated.  He has a cluster of several nodules of the  medial left calf which are firm and purpleish in color and spanning a length of 6 cm  Neurological:     General: No focal deficit present.     Mental Status: He is alert and oriented to person, place, and time.  Psychiatric:        Mood and Affect: Mood normal.        Behavior: Behavior normal.        Thought Content: Thought content normal.        Judgment: Judgment normal.     LABS:      Latest Ref Rng & Units 02/24/2022   12:00 AM 02/07/2022   12:00 AM 02/01/2022    6:09 AM  CBC  WBC  12.1     15.1     18.1   Hemoglobin 13.5 - 17.5 13.3     14.1     11.7   Hematocrit 41 - 53 40     42     34.4   Platelets 150 - 400 K/uL 261     335     251      This result is from an external source.      Latest Ref Rng & Units  02/24/2022   12:00 AM 02/07/2022   12:00 AM 02/01/2022    6:09 AM  CMP  Glucose 70 - 99 mg/dL   127   BUN 4 - 21 32     26     34   Creatinine 0.6 - 1.3 0.8     1.0     1.07   Sodium 137 - 147 141     140     139   Potassium 3.5 - 5.1 mEq/L 4.1     4.2     4.1   Chloride 99 - 108 106     104     104   CO2 13 - _0 Calcium 8.7 - 10.7 9.1     8.7     8.5   Total Protein 6.5 - 8.1 g/dL   5.9   Total Bilirubin 0.3 - 1.2 mg/dL   0.7   Alkaline Phos 25 - 125 107     88     52   AST 14 - 40 _1 ALT 10 - 40 U/L _2 This result is from an external source.     No results found for: "CEA1", "CEA" / No results found for: "CEA1", "CEA" No results found for: "PSA1" No results found for: "ULG493" No results found for: "CAN125"  No results found for: "TOTALPROTELP", "ALBUMINELP", "A1GS", "A2GS", "BETS", "BETA2SER", "GAMS", "MSPIKE", "SPEI" No results found for: "TIBC", "FERRITIN", "IRONPCTSAT" Lab Results  Component Value Date   LDH 160 02/07/2022    STUDIES:  No results found.

## 2022-02-24 ENCOUNTER — Inpatient Hospital Stay: Payer: Medicare HMO

## 2022-02-24 ENCOUNTER — Inpatient Hospital Stay: Payer: Medicare HMO | Admitting: Hematology and Oncology

## 2022-02-24 ENCOUNTER — Inpatient Hospital Stay: Payer: Medicare HMO | Attending: Oncology | Admitting: Oncology

## 2022-02-24 ENCOUNTER — Encounter: Payer: Self-pay | Admitting: Oncology

## 2022-02-24 VITALS — BP 142/73 | HR 88 | Temp 98.0°F | Resp 16 | Ht 69.5 in | Wt 232.3 lb

## 2022-02-24 DIAGNOSIS — C439 Malignant melanoma of skin, unspecified: Secondary | ICD-10-CM | POA: Diagnosis not present

## 2022-02-24 DIAGNOSIS — R918 Other nonspecific abnormal finding of lung field: Secondary | ICD-10-CM | POA: Diagnosis not present

## 2022-02-24 DIAGNOSIS — D649 Anemia, unspecified: Secondary | ICD-10-CM | POA: Diagnosis not present

## 2022-02-24 LAB — BASIC METABOLIC PANEL
BUN: 32 — AB (ref 4–21)
CO2: 27 — AB (ref 13–22)
Chloride: 106 (ref 99–108)
Creatinine: 0.8 (ref 0.6–1.3)
Glucose: 100
Potassium: 4.1 mEq/L (ref 3.5–5.1)
Sodium: 141 (ref 137–147)

## 2022-02-24 LAB — HEPATIC FUNCTION PANEL
ALT: 21 U/L (ref 10–40)
AST: 26 (ref 14–40)
Alkaline Phosphatase: 107 (ref 25–125)
Bilirubin, Total: 0.6

## 2022-02-24 LAB — CBC: RBC: 4.61 (ref 3.87–5.11)

## 2022-02-24 LAB — COMPREHENSIVE METABOLIC PANEL
Albumin: 4 (ref 3.5–5.0)
Calcium: 9.1 (ref 8.7–10.7)

## 2022-02-24 LAB — CBC AND DIFFERENTIAL
HCT: 40 — AB (ref 41–53)
Hemoglobin: 13.3 — AB (ref 13.5–17.5)
Neutrophils Absolute: 7.74
Platelets: 261 10*3/uL (ref 150–400)
WBC: 12.1

## 2022-02-28 DIAGNOSIS — L859 Epidermal thickening, unspecified: Secondary | ICD-10-CM | POA: Diagnosis not present

## 2022-02-28 DIAGNOSIS — R911 Solitary pulmonary nodule: Secondary | ICD-10-CM | POA: Diagnosis not present

## 2022-02-28 DIAGNOSIS — Z7902 Long term (current) use of antithrombotics/antiplatelets: Secondary | ICD-10-CM | POA: Diagnosis not present

## 2022-02-28 DIAGNOSIS — C434 Malignant melanoma of scalp and neck: Secondary | ICD-10-CM | POA: Diagnosis not present

## 2022-02-28 DIAGNOSIS — I10 Essential (primary) hypertension: Secondary | ICD-10-CM | POA: Diagnosis not present

## 2022-02-28 DIAGNOSIS — Z7982 Long term (current) use of aspirin: Secondary | ICD-10-CM | POA: Diagnosis not present

## 2022-02-28 DIAGNOSIS — C439 Malignant melanoma of skin, unspecified: Secondary | ICD-10-CM | POA: Diagnosis not present

## 2022-02-28 DIAGNOSIS — C4372 Malignant melanoma of left lower limb, including hip: Secondary | ICD-10-CM | POA: Diagnosis not present

## 2022-02-28 DIAGNOSIS — Z87891 Personal history of nicotine dependence: Secondary | ICD-10-CM | POA: Diagnosis not present

## 2022-02-28 DIAGNOSIS — Z8673 Personal history of transient ischemic attack (TIA), and cerebral infarction without residual deficits: Secondary | ICD-10-CM | POA: Diagnosis not present

## 2022-03-01 DIAGNOSIS — C4372 Malignant melanoma of left lower limb, including hip: Secondary | ICD-10-CM | POA: Diagnosis not present

## 2022-03-01 DIAGNOSIS — Z7982 Long term (current) use of aspirin: Secondary | ICD-10-CM | POA: Diagnosis not present

## 2022-03-01 DIAGNOSIS — Z7902 Long term (current) use of antithrombotics/antiplatelets: Secondary | ICD-10-CM | POA: Diagnosis not present

## 2022-03-01 DIAGNOSIS — Z483 Aftercare following surgery for neoplasm: Secondary | ICD-10-CM | POA: Diagnosis not present

## 2022-03-01 DIAGNOSIS — Z8673 Personal history of transient ischemic attack (TIA), and cerebral infarction without residual deficits: Secondary | ICD-10-CM | POA: Diagnosis not present

## 2022-03-01 DIAGNOSIS — Z87891 Personal history of nicotine dependence: Secondary | ICD-10-CM | POA: Diagnosis not present

## 2022-03-01 DIAGNOSIS — K219 Gastro-esophageal reflux disease without esophagitis: Secondary | ICD-10-CM | POA: Diagnosis not present

## 2022-03-01 DIAGNOSIS — I10 Essential (primary) hypertension: Secondary | ICD-10-CM | POA: Diagnosis not present

## 2022-03-06 DIAGNOSIS — C439 Malignant melanoma of skin, unspecified: Secondary | ICD-10-CM | POA: Diagnosis not present

## 2022-03-09 DIAGNOSIS — M5416 Radiculopathy, lumbar region: Secondary | ICD-10-CM | POA: Diagnosis not present

## 2022-03-15 NOTE — Progress Notes (Signed)
Erroneous encounter

## 2022-03-24 ENCOUNTER — Ambulatory Visit: Payer: Self-pay | Admitting: Oncology

## 2022-03-24 ENCOUNTER — Other Ambulatory Visit: Payer: Medicare HMO

## 2022-03-24 DIAGNOSIS — L97929 Non-pressure chronic ulcer of unspecified part of left lower leg with unspecified severity: Secondary | ICD-10-CM | POA: Diagnosis not present

## 2022-03-24 DIAGNOSIS — Z87891 Personal history of nicotine dependence: Secondary | ICD-10-CM | POA: Diagnosis not present

## 2022-03-24 DIAGNOSIS — C4372 Malignant melanoma of left lower limb, including hip: Secondary | ICD-10-CM | POA: Diagnosis not present

## 2022-03-27 DIAGNOSIS — Z7902 Long term (current) use of antithrombotics/antiplatelets: Secondary | ICD-10-CM | POA: Diagnosis not present

## 2022-03-27 DIAGNOSIS — C4372 Malignant melanoma of left lower limb, including hip: Secondary | ICD-10-CM | POA: Diagnosis not present

## 2022-03-27 DIAGNOSIS — K219 Gastro-esophageal reflux disease without esophagitis: Secondary | ICD-10-CM | POA: Diagnosis not present

## 2022-03-27 DIAGNOSIS — I10 Essential (primary) hypertension: Secondary | ICD-10-CM | POA: Diagnosis not present

## 2022-03-27 DIAGNOSIS — Z7982 Long term (current) use of aspirin: Secondary | ICD-10-CM | POA: Diagnosis not present

## 2022-03-27 DIAGNOSIS — Z8673 Personal history of transient ischemic attack (TIA), and cerebral infarction without residual deficits: Secondary | ICD-10-CM | POA: Diagnosis not present

## 2022-03-27 DIAGNOSIS — Z87891 Personal history of nicotine dependence: Secondary | ICD-10-CM | POA: Diagnosis not present

## 2022-03-27 DIAGNOSIS — Z483 Aftercare following surgery for neoplasm: Secondary | ICD-10-CM | POA: Diagnosis not present

## 2022-03-28 ENCOUNTER — Inpatient Hospital Stay: Payer: Medicare HMO | Attending: Oncology | Admitting: Genetic Counselor

## 2022-03-28 ENCOUNTER — Inpatient Hospital Stay: Payer: Medicare HMO

## 2022-03-28 DIAGNOSIS — Z79899 Other long term (current) drug therapy: Secondary | ICD-10-CM | POA: Insufficient documentation

## 2022-03-28 DIAGNOSIS — Z5112 Encounter for antineoplastic immunotherapy: Secondary | ICD-10-CM | POA: Insufficient documentation

## 2022-03-28 DIAGNOSIS — C4372 Malignant melanoma of left lower limb, including hip: Secondary | ICD-10-CM | POA: Insufficient documentation

## 2022-04-05 ENCOUNTER — Other Ambulatory Visit: Payer: Self-pay | Admitting: Oncology

## 2022-04-05 DIAGNOSIS — C439 Malignant melanoma of skin, unspecified: Secondary | ICD-10-CM

## 2022-04-06 ENCOUNTER — Inpatient Hospital Stay (INDEPENDENT_AMBULATORY_CARE_PROVIDER_SITE_OTHER): Payer: Medicare HMO | Admitting: Oncology

## 2022-04-06 ENCOUNTER — Inpatient Hospital Stay: Payer: Medicare HMO

## 2022-04-06 VITALS — BP 161/70 | HR 93 | Temp 96.9°F | Resp 20 | Ht 69.5 in | Wt 229.8 lb

## 2022-04-06 DIAGNOSIS — C439 Malignant melanoma of skin, unspecified: Secondary | ICD-10-CM

## 2022-04-06 DIAGNOSIS — C792 Secondary malignant neoplasm of skin: Secondary | ICD-10-CM

## 2022-04-06 LAB — COMPREHENSIVE METABOLIC PANEL
Albumin: 3.9 (ref 3.5–5.0)
Calcium: 8.7 (ref 8.7–10.7)

## 2022-04-06 LAB — CBC: RBC: 4.87 (ref 3.87–5.11)

## 2022-04-06 LAB — CBC AND DIFFERENTIAL
HCT: 40 — AB (ref 41–53)
Hemoglobin: 13.3 — AB (ref 13.5–17.5)
Neutrophils Absolute: 12.12
Platelets: 280 10*3/uL (ref 150–400)
WBC: 16.6

## 2022-04-06 LAB — BASIC METABOLIC PANEL
BUN: 21 (ref 4–21)
CO2: 25 — AB (ref 13–22)
Chloride: 104 (ref 99–108)
Creatinine: 0.8 (ref 0.6–1.3)
Glucose: 126
Potassium: 3.8 mEq/L (ref 3.5–5.1)
Sodium: 137 (ref 137–147)

## 2022-04-06 LAB — HEPATIC FUNCTION PANEL
ALT: 20 U/L (ref 10–40)
AST: 22 (ref 14–40)
Alkaline Phosphatase: 134 — AB (ref 25–125)
Bilirubin, Total: 0.6

## 2022-04-06 NOTE — Progress Notes (Signed)
[facility]  [address] [place],  [place]  [address] [phone]  Clinic Day:  04/06/2022  Referring physician: Consuello Closs, MD   ASSESSMENT & PLAN:   Malignant melanoma of the left foot He has had a punch biopsy and this reveals a Breslow thickness of 1.6 mm and Clark's level 4 with ulceration for a T2b N0 M0 clinically.  We now find no distant metastasis on PET scan.    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  Multiple lesions of the lung These are suspicious for metastatic disease.  The PET scan is negative but we cannot absolutely rule out metastatic disease since these lesions are small.  Spinal stenosis at L4-5 He has had surgical decompression and resection of a synovial cyst as of June 8, 2 weeks ago.  Gouty arthritis He had an acute flare last week involving primarily his elbow.     I have explained to the patient we will recommend immunotherapy after the staging is completed. His tumor is positive for BRAF mutation. PDL1 total score was 90%. I explained the fact that he will need to be off corticosteroids to receive immunotherapy, and he has nearly tapered off of that.  I reviewed the schedule and potential toxicities of this treatment. His last surgery was 03/24/2022, but he is healing fine. The surgeon said once healing has improved he will be able to start treatment. I will plan to see him back in 2 weeks. I added T4 and TSH to his labs.  I discussed the assessment and treatment plan with the patient.  The patient and his family were provided an opportunity to ask questions and all were answered.  The patient agreed with the plan and demonstrated an understanding of the instructions.  The patient was advised to call back if the symptoms worsen or if the condition fails to improve as anticipated.   I provided 30 minutes of  face-to-face time during this this encounter and > 50% was spent counseling as documented under my assessment and plan.    Derwood Kaplan, MD [facility] [address] [place] [address] Dept: [phone] Dept Fax: [phone]   CHIEF COMPLAINT:  CC: Malignant melanoma  Current Treatment: Evaluation and immunotherapy   HISTORY OF PRESENT ILLNESS:  [name] is a 76 y.o. male with a history of malignant melanoma who is referred in consultation with Dr. Bertram Millard for assessment and management.  He had a lesion of the dorsum of the left foot in September 2022 which appeared to be a granuloma and was treated in the office.  He later developed a another lesion lateral to this on the dorsum of the left foot which was not hyperpigmented but did bleed easily.  He also has several lesions of his left medial calf which are nodular and span approximately 6 cm.  He has had some increased edema of the left lower leg.  He was evaluated by dermatology on June 5, who felt this could be a squamous cell carcinoma versus tinea or a drug reaction.  He had biopsies done of both the foot lesion and the Lesion.  Pathology came back that the left dorsal foot shave biopsy revealed a malignant melanoma possibly a primary nodular ulcerated melanoma which had a Breslow thickness of at least 1.6 mm and a  Clark's level 4.  Margins are positive as this was a shave biopsy.  Ulceration is present but no satellitosis.  The mitotic index is 4/mm and no lymphovascular invasion was identified.  No brisk tumor infiltrating lymphocytes are seen and tumor regression is absent.  This is felt to be at least a T2b lesion and wide excision was recommended.  The lesion of the left medial calf revealed a dermal map melanoma most consistent with metastatic disease.  This information was not available when the patient was admitted for back surgery on June 8 and he had  a microlumbar decompression at L4-5 for spinal stenosis.  He was also found to have a synovial cyst at that time which was resected and benign.  He was readmitted several days later for acute pain of the right elbow and both arms.  Aspiration was obtained and this was diagnosed as gouty arthritis.  He did have temporary rehab after his back surgery.  His left leg was evaluated for deep venous thrombosis and was negative.  While he was recuperating from his surgery he had an x-ray which was abnormal which led to a CT scan which revealed at least 3 lung lesions.  MRI of the brain was negative and he is now referred for management of the malignant melanoma.  INTERVAL HISTORY:  I have reviewed his chart and materials related to his cancer extensively and collaborated history with the patient. Summary of oncologic history is as follows: Oncology History  Malignant melanoma, metastatic ([place])  01/23/2022 Cancer Staging   Staging form: Melanoma of the Skin, AJCC 8th Edition - Clinical stage from 01/23/2022: Stage III (cT2b, cN1c, cM0) - Signed by Derwood Kaplan, MD on 04/07/2022 Histopathologic type: Malignant melanoma, NOS (except juvenile melanoma M-8770/[id number]) Stage prefix: Initial diagnosis Laterality: Left Lymph-vascular invasion (LVI): LVI not present (absent)/not identified Diagnostic confirmation: Positive histology Specimen type: Biopsy / Limited Resection Staged by: Managing physician Mitotic count: 4 Mitotic unit: mm2 Clark's level: Level IV Tumor-infiltrating lymphocytes: Present and non-brisk Breslow depth (mm): 16 Ulceration of the epidermis: Yes Microsatellites: No Primary tumor regression: Absent Intransit/satellite metastasis: In-transit Lymph node clinically or radiologically detected: No Microscopic confirmation of metastasis: No Sentinel lymph node biopsy performed: No Completion or therapeutic lymph node dissection performed: No Matted nodes: No Stage used in treatment  planning: Yes National guidelines used in treatment planning: Yes Type of national guideline used in treatment planning: NCCN   01/31/2022 Initial Diagnosis   Malignant melanoma, metastatic ([place])   04/14/2022 -  Chemotherapy   Patient is on Treatment Plan : MELANOMA Nivolumab (3) + Ipilimumab (1) q21d / Nivolumab (240) q14d     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[place])  02/28/2022 Cancer Staging   Staging form: Melanoma of the Skin, AJCC 8th Edition - Clinical stage from 02/28/2022: Stage IV (cT2b(m), cN1c, cM1a) - Signed by Derwood Kaplan, MD on 04/07/2022 Histopathologic type: Malignant melanoma, NOS (except juvenile melanoma M-8770/0) Stage prefix: Initial diagnosis Laterality: Left Multiple tumors: Yes Lymph-vascular invasion (LVI): LVI present/identified, NOS Diagnostic confirmation: Positive histology Specimen type: Excision Staged by: Managing physician Intransit/satellite metastasis: Both in-transit and satellite Prognostic indicators: 02/28/22:  Wide excision of primary of foot - no residual melanoma,  Excision of skin of calf consistent with intransit mets of dermal/subcuticular   Melanoma with LVI, metastatic disease with multifocal lesions and multifocal positive margins 03/24/22: Re-excision with a few atypical melanocytes, no melanoma and clear margins  04/07/2022 Initial Diagnosis   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 He had shave biopsy of the melanoma of the left foot on January 23 2022. This was nodular malignant melanoma with a BRESLOW'S DEPTH of at least 1.6 mm and CLARKS LEVEL IV.  Last surgery was 03/24/2022, but states he is healing fine. The surgeon said once healing has improved he will be able to start treatment. We will aim for  August 25th 2023. He has had a port placed. His tumor is positive BRAF mutation. PDL1 total score was 90%. He continues to have edema of the left leg. He reports of his back feeling better, it does have its moments but overall has improved. His appetite is okay. He has lost 3 pounds since his last visit. White count is slightly elevated at 16.6 but we will continue to observe. Chemistries are normal. Hemoglobin is slightly low at 13.3. We will add baseline thyroid tests to his labs today. He does admit to occasional cough but denies hemoptysis, chest pain, dyspnea, or wheezing. He denies fever, chills, night sweats, or other signs of infection. He denies cardiorespiratory and gastrointestinal issues.   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 with in-transit metastases  Past Surgical History:  Procedure Laterality Date   LUMBAR LAMINECTOMY/DECOMPRESSION MICRODISCECTOMY N/A 01/26/2022   Procedure: Microlumbar decompression Lumbar four-five, lateral mass fusion with autograft and allograft bone;  Surgeon: Susa Day, MD;  Location: [facility];  Service: Orthopedics;  Laterality: N/A;   PARTIAL GASTRECTOMY     TOTAL HIP ARTHROPLASTY Right 06/18/2018   Procedure: RIGHT TOTAL HIP ARTHROPLASTY ANTERIOR APPROACH;  Surgeon: [name], MD;  Location: [facility];  Service: Orthopedics;  Laterality: Right;    Family History  Problem Relation Age of Onset   Melanoma Brother     Social History:  reports that he has quit smoking. He has never used smokeless tobacco. He reports current alcohol use. He reports that he does not use drugs.The patient is married and accompanied by his wife  today.  His daughter-in-law Freida Busman is also here today.  They drink about 1 glass of wine daily.  They have a son and daughter.  He is retired from working with [employer] and did have chemical exposures with that.  Allergies:  Allergies  Allergen Reactions   Morphine Itching    Patient states it is tolerable itching    Current Medications: Current Outpatient Medications  Medication Sig Dispense Refill   amLODipine (NORVASC) 5 MG tablet Take 5 mg by mouth daily.     clopidogrel (PLAVIX) 75 MG tablet Take 1 tablet by mouth daily.     docusate sodium (COLACE) 100 MG capsule 1 capsule 2 TIMES DAILY (route: oral)     famotidine (PEPCID) 20 MG tablet Take 20 mg by mouth at bedtime as needed for heartburn or indigestion.     Flaxseed, Linseed, (FLAXSEED OIL) 1000 MG  CAPS Take 1,000 mg by mouth daily.     folic acid (FOLVITE) 665 MCG tablet Take 400 mcg by mouth daily.     gabapentin (NEURONTIN) 300 MG capsule Take 1 capsule (300 mg total) by mouth 3 (three) times daily. 90 capsule 2   hydrochlorothiazide (HYDRODIURIL) 12.5 MG tablet Take 12.5 mg by mouth daily.     lisinopril (ZESTRIL) 20 MG tablet      Multiple Vitamin (MULTIVITAMIN WITH MINERALS) TABS tablet Take 1 tablet by mouth in the morning.     Omega-3 1000 MG CAPS Take by mouth.     ondansetron (ZOFRAN) 4 MG tablet Take 1 tablet (4 mg total) by mouth every 4 (four) hours as needed for nausea. 90 tablet 3   oxyCODONE (OXY IR/ROXICODONE) 5 MG immediate release tablet Take 1 tablet (5 mg total) by mouth every 6 (six) hours as needed for severe pain. 30 tablet 0   polyethylene glycol (MIRALAX / GLYCOLAX) 17 g packet Take 17 g by mouth daily. 14 each 0   pravastatin (PRAVACHOL) 40 MG tablet Take 40 mg by mouth daily.     prochlorperazine (COMPAZINE) 10 MG tablet Take 1 tablet (10 mg total) by mouth every 6 (six) hours as needed for nausea or vomiting. 90 tablet 3   traZODone (DESYREL) 50 MG tablet Take 50 mg by mouth at bedtime as needed for  sleep.     No current facility-administered medications for this visit.    REVIEW OF SYSTEMS:  Review of Systems  HENT:  Negative.    Eyes: Negative.   Cardiovascular:  Negative for leg swelling.  Gastrointestinal: Negative.   Endocrine: Negative.   Genitourinary: Negative.    Musculoskeletal:  Positive for arthralgias and back pain.  Skin:  Positive for wound.       He has a lesion on the dorsum of the left foot as well as some nodules of the medial left calf.  Neurological: Negative.   Hematological: Negative.   Psychiatric/Behavioral: Negative.        VITALS:  Blood pressure (!) 161/70, pulse 93, temperature (!) 96.9 F (36.1 C), temperature source Oral, resp. rate 20, height 5' 9.5" (1.765 m), weight 229 lb 12.8 oz (104.2 kg), SpO2 95 %.  Wt Readings from Last 3 Encounters:  04/25/22 229 lb 11.2 oz (104.2 kg)  04/14/22 230 lb 1.9 oz (104.4 kg)  04/07/22 228 lb 11.2 oz (103.7 kg)    Body mass index is 33.45 kg/m.  Performance status (ECOG): 1 - Symptomatic but completely ambulatory  PHYSICAL EXAM:  Physical Exam Constitutional:      Appearance: Normal appearance.  HENT:     Head: Normocephalic and atraumatic.     Nose: Nose normal.     Mouth/Throat:     Pharynx: Oropharynx is clear.  Eyes:     Extraocular Movements: Extraocular movements intact.     Conjunctiva/sclera: Conjunctivae normal.     Pupils: Pupils are equal, round, and reactive to light.  Cardiovascular:     Rate and Rhythm: Normal rate and regular rhythm.     Pulses: Normal pulses.     Heart sounds: Normal heart sounds. No murmur heard.    No gallop.  Pulmonary:     Breath sounds: Normal breath sounds.  Abdominal:     General: Bowel sounds are normal.     Palpations: Abdomen is soft.     Tenderness: There is no abdominal tenderness.  Musculoskeletal:     Cervical back: Normal range of  motion.     Left lower leg: Edema present.     Comments: 1+ edema  Skin:    General: Skin is warm and  dry.     Findings: Lesion present.     Comments: He has a scaly excoriated lesion of the lateral dorsum of the left foot.  This appears ulcerated.  He has a cluster of several nodules of the medial left calf which are firm and purpleish in color and spanning a length of 6 cm  Neurological:     General: No focal deficit present.     Mental Status: He is alert and oriented to person, place, and time.  Psychiatric:        Mood and Affect: Mood normal.        Behavior: Behavior normal.        Thought Content: Thought content normal.        Judgment: Judgment normal.     LABS:      Latest Ref Rng & Units 04/25/2022   12:00 AM 04/06/2022   12:00 AM 02/24/2022   12:00 AM  CBC  WBC  14.3     16.6     12.1      Hemoglobin 13.5 - 17.5 13.5     13.3     13.3      Hematocrit 41 - 53 40     40     40      Platelets 150 - 400 K/uL 262     280     261         This result is from an external source.      Latest Ref Rng & Units 04/25/2022   12:00 AM 04/06/2022   12:00 AM 02/24/2022   12:00 AM  CMP  BUN 4 - $R'21 25     21     'XT$ 32      Creatinine 0.6 - 1.3 0.9     0.8     0.8      Sodium 137 - 147 138     137     141      Potassium 3.5 - 5.1 mEq/L 4.1     3.8     4.1      Chloride 99 - 108 107     104     106      CO2 13 - $Re'22 24     25     27      'QFb$ Calcium 8.7 - 10.7 8.7     8.7     9.1      Alkaline Phos 25 - 125 93     134     107      AST 14 - 40 $Re'27     22     26      'lfk$ ALT 10 - 40 U/L $Remo'23     20     21         'XNORp$ This result is from an external source.     No results found for: "CEA1", "CEA" / No results found for: "CEA1", "CEA" No results found for: "PSA1" No results found for: "VOJ500" No results found for: "CAN125"  No results found for: "TOTALPROTELP", "ALBUMINELP", "A1GS", "A2GS", "BETS", "BETA2SER", "GAMS", "MSPIKE", "SPEI" No results found for: "TIBC", "FERRITIN", "IRONPCTSAT" Lab Results  Component Value Date   LDH 160 02/07/2022    STUDIES:  No results found.    I,Gabriella  Ballesteros,acting  as a scribe for Derwood Kaplan, MD.,have documented all relevant documentation on the behalf of Derwood Kaplan, MD,as directed by  Derwood Kaplan, MD while in the presence of Derwood Kaplan, MD.

## 2022-04-07 ENCOUNTER — Inpatient Hospital Stay: Payer: Medicare HMO | Admitting: Hematology and Oncology

## 2022-04-07 ENCOUNTER — Other Ambulatory Visit: Payer: Self-pay | Admitting: Oncology

## 2022-04-07 ENCOUNTER — Other Ambulatory Visit: Payer: Self-pay

## 2022-04-07 ENCOUNTER — Other Ambulatory Visit: Payer: Self-pay | Admitting: Pharmacist

## 2022-04-07 ENCOUNTER — Encounter: Payer: Self-pay | Admitting: Hematology and Oncology

## 2022-04-07 VITALS — BP 130/72 | HR 94 | Temp 98.2°F | Resp 20 | Ht 69.7 in | Wt 228.7 lb

## 2022-04-07 DIAGNOSIS — C4372 Malignant melanoma of left lower limb, including hip: Secondary | ICD-10-CM | POA: Diagnosis not present

## 2022-04-07 DIAGNOSIS — C439 Malignant melanoma of skin, unspecified: Secondary | ICD-10-CM

## 2022-04-07 DIAGNOSIS — C792 Secondary malignant neoplasm of skin: Secondary | ICD-10-CM | POA: Insufficient documentation

## 2022-04-07 DIAGNOSIS — Z5112 Encounter for antineoplastic immunotherapy: Secondary | ICD-10-CM | POA: Diagnosis not present

## 2022-04-07 DIAGNOSIS — Z79899 Other long term (current) drug therapy: Secondary | ICD-10-CM | POA: Diagnosis not present

## 2022-04-07 LAB — TSH: TSH: 1.166 u[IU]/mL (ref 0.350–4.500)

## 2022-04-07 MED ORDER — PROCHLORPERAZINE MALEATE 10 MG PO TABS: 10.0000 mg | ORAL_TABLET | Freq: Four times a day (QID) | ORAL | 3 refills | Status: DC | PRN

## 2022-04-07 MED ORDER — ONDANSETRON HCL 4 MG PO TABS: 4.0000 mg | ORAL_TABLET | ORAL | 3 refills | Status: DC | PRN

## 2022-04-07 NOTE — Progress Notes (Signed)
START OFF PATHWAY REGIMEN - Melanoma and Other Skin Cancers   [id number]:Ipilimumab 1 mg/kg IV D1 + Nivolumab 3 mg/kg IV D1 q21 Days x 4 Cycles Followed by Nivolumab 240 mg IV D1 q14 Days:   Cycles 1 through 4: A cycle is every 21 days:     Nivolumab      Ipilimumab    Cycles 5 and beyond: A cycle is every 14 days:     Nivolumab   **Always confirm dose/schedule in your pharmacy ordering system**  **Administration Notes: Good PS, wants aggressive approach  Clinician Citation: NCCN: combination is superior UpToDate: Ipi/nivo combination  Patient Characteristics: Melanoma, Cutaneous/Unknown Primary, Local/In Transit/Nodal Recurrence, Resected, BRAF V600 Activating Mutation Positive Disease Classification: Melanoma Disease Subtype: Cutaneous BRAF V600 Mutation Status: BRAF V600 Activating Mutation Positive Therapeutic Status: Local/In Transit/Nodal Recurrence Intent of Therapy: Curative Intent, Discussed with Patient

## 2022-04-07 NOTE — Progress Notes (Signed)
[place] NOTE Patient Care Team: Consuello Closs, MD as PCP - General (Nephrology) Derwood Kaplan, MD as Consulting Physician (Oncology)   Name of the patient: [name]  [id number]  1946/04/05   Date of visit: 04/07/22  Diagnosis- Melanoma  Chief complaint/Reason for visit- Initial Meeting for [facility], preparing for starting chemotherapy    Heme/Onc history:  Oncology History  Malignant melanoma, metastatic ([place])  01/23/2022 Cancer Staging   Staging form: Melanoma of the Skin, AJCC 8th Edition - Clinical stage from 01/23/2022: Stage III (cT2b, cN1c, cM0) - Signed by Derwood Kaplan, MD on 04/07/2022 Histopathologic type: Malignant melanoma, NOS (except juvenile melanoma M-8770/0) Stage prefix: Initial diagnosis Laterality: Left Lymph-vascular invasion (LVI): LVI not present (absent)/not identified Diagnostic confirmation: Positive histology Specimen type: Biopsy / Limited Resection Staged by: Managing physician Mitotic count: 4 Mitotic unit: mm2 Clark's level: Level IV Tumor-infiltrating lymphocytes: Present and non-brisk Breslow depth (mm): 16 Ulceration of the epidermis: Yes Microsatellites: No Primary tumor regression: Absent Intransit/satellite metastasis: In-transit Lymph node clinically or radiologically detected: No Microscopic confirmation of metastasis: No Sentinel lymph node biopsy performed: No Completion or therapeutic lymph node dissection performed: No Matted nodes: No Stage used in treatment planning: Yes National guidelines used in treatment planning: Yes Type of national guideline used in treatment planning: NCCN   01/31/2022 Initial Diagnosis   Malignant melanoma, metastatic ([place])   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[place])  02/28/2022 Cancer Staging   Staging form: Melanoma of the Skin, AJCC 8th Edition - Clinical stage from 02/28/2022: Stage IV (cT2b(m), cN1c, cM1a) - Signed by Derwood Kaplan, MD on  04/07/2022 Histopathologic type: Malignant melanoma, NOS (except juvenile melanoma M-8770/0) Stage prefix: Initial diagnosis Laterality: Left Multiple tumors: Yes Lymph-vascular invasion (LVI): LVI present/identified, NOS Diagnostic confirmation: Positive histology Specimen type: Excision Staged by: Managing physician Intransit/satellite metastasis: Both in-transit and satellite Prognostic indicators: 02/28/22:  Wide excision of primary of foot - no residual melanoma,  Excision of skin of calf consistent with intransit mets of dermal/subcuticular   Melanoma with LVI, metastatic disease with multifocal lesions and multifocal positive margins 03/24/22: Re-excision with a few atypical melanocytes, no melanoma and clear margins  04/07/2022 Initial Diagnosis   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 I introduced the chemo care clinic and we discussed that the role of the clinic is to assist those who are at an increased risk of emergency room visits and/or complications during the course of chemotherapy treatment. We discussed that the increased risk takes into account factors such as age, performance status, and co-morbidities. We also discussed that for some, this might include barriers to care such as not having a primary care provider, lack of insurance/transportation, or not being able to afford medications. We discussed that the goal of the program is to help prevent unplanned ER visits and help reduce complications during chemotherapy. We do this by discussing specific risk factors to each individual and identifying ways that we can help improve these risk factors and reduce barriers to care.   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 , 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     Past Surgical History:  Procedure Laterality Date   LUMBAR LAMINECTOMY/DECOMPRESSION MICRODISCECTOMY N/A 01/26/2022   Procedure: Microlumbar decompression Lumbar four-five, lateral mass fusion with autograft and allograft bone;  Surgeon: Susa Day, MD;  Location: [facility];  Service: Orthopedics;  Laterality: N/A;   PARTIAL GASTRECTOMY     TOTAL HIP ARTHROPLASTY Right 06/18/2018   Procedure: RIGHT TOTAL HIP ARTHROPLASTY ANTERIOR APPROACH;  Surgeon: [name], MD;  Location: [facility];  Service: Orthopedics;  Laterality: Right;    Social History   Socioeconomic History   Marital status: Married    Spouse name: Ann   Number of children: 2   Years of education: 12   Highest education level: 12th grade  Occupational History   Not on file  Tobacco Use   Smoking status: Former   Smokeless tobacco: Never   Tobacco comments:    quit 40 years ago   Vaping Use   Vaping Use: Never used  Substance and Sexual Activity   Alcohol use: Yes    Comment: moderate  ; 2-3 beers each weekend    Drug use: Never   Sexual activity: Not Currently  Other Topics Concern   Not on file  Social History Narrative   Not on file   Social Determinants of Health   Financial Resource Strain: Not on file  Food Insecurity: Not on file  Transportation Needs: Not on file  Physical Activity: Not on file  Stress: Not on file  Social Connections: Not on file  Intimate Partner Violence: Not on file    Family History  Problem Relation Age of Onset   Melanoma Brother      Current Outpatient Medications:    amLODipine (NORVASC) 5 MG tablet, Take 5 mg by mouth daily., Disp: , Rfl:    aspirin EC 81 MG tablet, Take 1 tablet by mouth daily., Disp: , Rfl:    clopidogrel (PLAVIX) 75 MG tablet, Take 1 tablet by mouth  daily., Disp: , Rfl:    docusate sodium (COLACE) 100 MG capsule, Take 1 capsule (100 mg total) by mouth 2 (two) times daily as needed for mild constipation., Disp: 30 capsule, Rfl: 1   famotidine (PEPCID) 20 MG tablet, Take 20 mg by mouth at bedtime as needed for heartburn or indigestion., Disp: , Rfl:    Flaxseed, Linseed, (FLAXSEED OIL) 1000 MG CAPS, Take 1,000 mg by mouth daily., Disp: , Rfl:    folic acid (FOLVITE) 161 MCG tablet, Take 400 mcg by mouth daily., Disp: , Rfl:    gabapentin (NEURONTIN) 100 MG capsule, Take 300 mg by mouth 3 (three) times daily., Disp: , Rfl:    hydrochlorothiazide (HYDRODIURIL) 12.5 MG tablet, Take 12.5 mg by mouth daily., Disp: , Rfl:    lisinopril (ZESTRIL) 20 MG tablet, , Disp: , Rfl:    Multiple Vitamin (MULTIVITAMIN WITH MINERALS) TABS tablet, Take 1 tablet by mouth in the morning., Disp: , Rfl:    Omega-3 1000 MG CAPS, Take by mouth., Disp: , Rfl:    polyethylene glycol (MIRALAX / GLYCOLAX) 17 g packet, Take 17 g by mouth daily., Disp: 14 each, Rfl: 0   pravastatin (PRAVACHOL) 40 MG tablet, Take 40 mg by mouth daily., Disp: , Rfl:    traZODone (DESYREL) 50 MG tablet, Take 50 mg by mouth at bedtime as needed for sleep., Disp: , Rfl:  Latest Ref Rng & Units 04/06/2022   12:00 AM  CMP  BUN 4 - 21 21      Creatinine 0.6 - 1.3 0.8      Sodium 137 - 147 137      Potassium 3.5 - 5.1 mEq/L 3.8      Chloride 99 - 108 104      CO2 13 - 22 25      Calcium 8.7 - 10.7 8.7      Alkaline Phos 25 - 125 134      AST 14 - 40 22      ALT 10 - 40 U/L 20         This result is from an external source.      Latest Ref Rng & Units 04/06/2022   12:00 AM  CBC  WBC  16.6      Hemoglobin 13.5 - 17.5 13.3      Hematocrit 41 - 53 40      Platelets 150 - 400 K/uL 280         This result is from an external source.    No images are attached to the encounter.  No results found.   Assessment and plan- Patient is a 76 y.o. male who presents to [facility]  for initial meeting in preparation for starting chemotherapy for the treatment of melanoma.   Chemo Care Clinic/High Risk for ER/Hospitalization during chemotherapy- We discussed the role of the chemo care clinic and identified patient specific risk factors. 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 , 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     Patient has past surgical history positive for: Past Surgical History:  Procedure Laterality Date   LUMBAR LAMINECTOMY/DECOMPRESSION MICRODISCECTOMY N/A 01/26/2022   Procedure: Microlumbar decompression Lumbar four-five, lateral mass fusion with autograft and allograft bone;  Surgeon: Susa Day, MD;  Location: [facility];  Service: Orthopedics;  Laterality: N/A;   PARTIAL GASTRECTOMY     TOTAL HIP ARTHROPLASTY Right 06/18/2018   Procedure: RIGHT TOTAL HIP ARTHROPLASTY ANTERIOR APPROACH;  Surgeon: [name], MD;  Location[facility];  Service: Orthopedics;  Laterality: Right;   Provided general information including the following: 1.  Date of education: 04/07/2022 2.  Physician name: Dr. Hinton Rao 3.  Diagnosis: Melanoma 4.  Stage: Stage IV 5.  Curative  6.  Chemotherapy plan including drugs and how often: 7.  Start date: 04/14/2022 8.  Other referrals: None at this time 9.  The patient is to call our office with any questions or concerns.  Our office number [phone], if after hours or on the weekend, call the same number and wait for the answering service.  There is always an oncologist on call 10.  Medications prescribed: Ondansetron, Prochlorperazine 11.  The patient has verbalized understanding of the treatment plan and has no barriers to  adherence or understanding.  Obtained signed consent from patient.  Discussed symptoms including 1.  Low blood counts including red blood cells, white blood cells and platelets. 2. Infection including to avoid large crowds, wash hands frequently, and stay away from people who were sick.  If fever develops of 100.4 or higher, call our office. 3.  Mucositis-given instructions on mouth rinse (baking soda and salt mixture).  Keep mouth clean.  Use soft bristle toothbrush.  If mouth sores develop, call our clinic. 4.  Nausea/vomiting-gave prescriptions for ondansetron 4 mg every 4 hours as needed for nausea, may take around the clock if persistent.  Compazine 10 mg every 6 hours, may take around the clock if persistent. 5.  Diarrhea-use over-the-counter Imodium.  Call clinic if not controlled. 6.  Constipation-use senna, 1 to 2 tablets twice a day.  If no BM in 2 to 3 days call the clinic. 7.  Loss of appetite-try to eat small meals every 2-3 hours.  Call clinic if not eating. 8.  Taste changes-zinc 500 mg daily.  If becomes severe call clinic. 9.  Alcoholic beverages. 10.  Drink 2 to 3 quarts of water per day. 11.  Peripheral neuropathy-patient to call if numbness or tingling in hands or feet is persistent  Neulasta-will be given 24 to 48 hours after chemotherapy.  Gave information sheet on bone and joint pain.  Use Claritin or Pepcid.  May use ibuprofen or Aleve.  Call if symptoms persist or are unbearable.  Gave information on the supportive care team and how to contact them regarding services.  Discussed advanced directives.  The patient does not have their advanced directives but will look at the copy provided in their notebook and will call with any questions. Spiritual Nutrition Financial Social worker Advanced directives  Answered questions to patient satisfaction.  Patient is to call with any further questions or concerns.   The medication prescribed to the patient will be printed  out from [place] references This will give the following information: Name of your medication Approved uses Dose and schedule Storage and handling Handling body fluids and waste Drug and food interactions Possible side effects and management Pregnancy, sexual activity, and contraception Obtaining medication   We discussed that social determinants of health may have significant impacts on health and outcomes for cancer patients.  Today we discussed specific social determinants of performance status, alcohol use, depression, financial needs, food insecurity, housing, interpersonal violence, social connections, stress, tobacco use, and transportation.    After lengthy discussion the following were identified as areas of need:   Outpatient services: We discussed options including home based and outpatient services, DME and care program. We discusssed that patients who participate in regular physical activity report fewer negative impacts of cancer and treatments and report less fatigue.   Financial Concerns: We discussed that living with cancer can create tremendous financial burden.  We discussed options for assistance. I asked that if assistance is needed in affording medications or paying bills to please let us know so that we can provide assistance. We discussed options for food including social services and onsite food pantry.  We will also notify Mort Sawyers to see if cancer center can provide additional support.  Referral to Social work: Introduced Education officer, museum Mort Sawyers and the services she can provide such as support with utility bill, cell phone and gas vouchers.   Support groups: We discussed options for support groups at the cancer center. If interested, please notify nurse navigator to enroll. We discussed options for managing stress including healthy eating, exercise as well as participating in no charge counseling services at the cancer center and support groups.  If these are of  interest, patient can notify either myself or primary nursing team.We discussed options for management  including medications and referral to quit Smart program  Transportation: We discussed options for transportation.  I have notified primary oncology team who will help assist with arranging cashawn transportation for appointments when/if needed. We also discussed options for transportation on short notice/acute visits.  Palliative care services: We have palliative care services available in the cancer center to discuss goals of care and advanced care planning.  Please let us know if you have any questions or would like to speak to our palliative nurse practitioner.  Symptom Management Clinic: We discussed our symptom management clinic which is available for acute concerns while receiving treatment such as nausea, vomiting or diarrhea.  We can be reached via telephone at [phone] or through my chart.  We are available for virtual or in person visits on the same day from 830 to 4 PM Monday through Friday. He denies needing specific assistance at this time and He will be followed by Dr. Hinton Rao clinical team.  Plan: Discussed symptom management clinic. Discussed palliative care services. Discussed resources that are available here at the cancer center. Discussed medications and new prescriptions to begin treatment such as anti-nausea or steroids.   Disposition: RTC on   Visit Diagnosis No diagnosis found.  Patient expressed understanding and was in agreement with this plan. He also understands that He can call clinic at any time with any questions, concerns, or complaints.   I provided 30 minutes of  face to face  during this encounter, and > 50% was spent counseling as documented under my assessment & plan.   Dayton Scrape, FNP- [facility]

## 2022-04-08 ENCOUNTER — Other Ambulatory Visit: Payer: Self-pay

## 2022-04-08 LAB — T4: T4, Total: 8 ug/dL (ref 4.5–12.0)

## 2022-04-11 ENCOUNTER — Encounter: Payer: Self-pay | Admitting: Oncology

## 2022-04-12 ENCOUNTER — Other Ambulatory Visit: Payer: Self-pay

## 2022-04-13 MED FILL — Nivolumab IV Soln 100 MG/10ML: INTRAVENOUS | Qty: 30 | Status: AC

## 2022-04-14 ENCOUNTER — Inpatient Hospital Stay: Payer: Medicare HMO

## 2022-04-14 VITALS — BP 148/81 | HR 88 | Temp 97.8°F | Resp 18 | Ht 69.7 in | Wt 230.1 lb

## 2022-04-14 DIAGNOSIS — C439 Malignant melanoma of skin, unspecified: Secondary | ICD-10-CM

## 2022-04-14 DIAGNOSIS — C792 Secondary malignant neoplasm of skin: Secondary | ICD-10-CM

## 2022-04-14 DIAGNOSIS — Z5112 Encounter for antineoplastic immunotherapy: Secondary | ICD-10-CM | POA: Diagnosis not present

## 2022-04-14 DIAGNOSIS — Z79899 Other long term (current) drug therapy: Secondary | ICD-10-CM | POA: Diagnosis not present

## 2022-04-14 DIAGNOSIS — C4372 Malignant melanoma of left lower limb, including hip: Secondary | ICD-10-CM | POA: Diagnosis not present

## 2022-04-14 MED ORDER — SODIUM CHLORIDE 0.9 % IV SOLN: Freq: Once | INTRAVENOUS | Status: AC

## 2022-04-14 MED ORDER — HEPARIN SOD (PORK) LOCK FLUSH 100 UNIT/ML IV SOLN
500.0000 [IU] | Freq: Once | INTRAVENOUS | Status: AC | PRN
  Administered 2022-04-14: 500 [IU]

## 2022-04-14 MED ORDER — SODIUM CHLORIDE 0.9 % IV SOLN
1.0000 mg/kg | Freq: Once | INTRAVENOUS | Status: AC
  Administered 2022-04-14: 100 mg via INTRAVENOUS
  Filled 2022-04-14: qty 20

## 2022-04-14 MED ORDER — DIPHENHYDRAMINE HCL 50 MG/ML IJ SOLN
25.0000 mg | Freq: Once | INTRAMUSCULAR | Status: AC
  Administered 2022-04-14: 25 mg via INTRAVENOUS
  Filled 2022-04-14: qty 1

## 2022-04-14 MED ORDER — SODIUM CHLORIDE 0.9% FLUSH
10.0000 mL | INTRAVENOUS | Status: DC | PRN
  Administered 2022-04-14: 10 mL

## 2022-04-14 MED ORDER — SODIUM CHLORIDE 0.9 % IV SOLN
2.8900 mg/kg | Freq: Once | INTRAVENOUS | Status: AC
  Administered 2022-04-14: 300 mg via INTRAVENOUS
  Filled 2022-04-14: qty 30

## 2022-04-14 MED ORDER — FAMOTIDINE IN NACL 20-0.9 MG/50ML-% IV SOLN
20.0000 mg | Freq: Once | INTRAVENOUS | Status: AC
  Administered 2022-04-14: 20 mg via INTRAVENOUS
  Filled 2022-04-14: qty 50

## 2022-04-14 NOTE — Progress Notes (Signed)
Ipilimumab (YERVOY) Patient Monitoring Assessment   Is the patient experiencing any of the following general symptoms?:  '[x]'$ Patient is not experiencing any of the general symptoms listed in this section.  '[]'$ Difficulty performing normal activities '[]'$ Feeling sluggish or cold all the time '[]'$ Unusual weight gain '[]'$ Constant or unusual headaches '[]'$ Feeling dizzy or faint '[]'$ Changes in eyesight (blurry vision, double vision, or other vision problems) '[]'$ Changes in mood or behavior (ex: decreased sex drive, irritability, or forgetfulness) '[]'$ Starting new medications (ex: steroids, other medications that lower immune response)   Gastrointestinal  Patient is having 1 bowel movements each day.  Is this different from baseline? '[]'$ Yes '[x]'$ No Are your stools watery or do they have a foul smell? '[]'$ Yes '[]'$ No Have you seen blood in your stools? '[]'$ Yes '[x]'$ No Are your stools dark, tarry, or sticky? '[]'$ Yes '[x]'$ No Are you having pain or tenderness in your belly? '[]'$ Yes '[x]'$ No  Skin Does your skin itch? '[]'$ Yes '[x]'$ No Do you have a rash? '[]'$ Yes '[x]'$ No Has your skin blistered and/or peeled? '[]'$ Yes '[x]'$ No Do you have sores in your mouth? '[]'$ Yes '[x]'$ No  Hepatic Has your urine been dark or tea colored? '[]'$ Yes '[x]'$ No Have you noticed your skin or the whites of your eyes are turning yellow? '[]'$ Yes '[x]'$ No Are you bleeding or bruising more easily than normal? '[]'$ Yes '[x]'$ No Are you nauseous and/or vomiting? '[]'$ Yes '[x]'$ No Do you have pain on the right side of your stomach? '[]'$ Yes '[x]'$ No  Neurologic  Are you having unusual weakness of legs, arms, or face? '[]'$ Yes '[x]'$ No Are you having numbness or tingling in your hands or feet? '[]'$ Yes '[x]'$ No  Emmaline Life

## 2022-04-14 NOTE — Patient Instructions (Signed)
[facility]  Discharge Instructions: Thank you for choosing [facility] to provide your oncology and hematology care.  If you have a lab appointment with the [facility], please go directly to the [facility] and check in at the registration area.   Wear comfortable clothing and clothing appropriate for easy access to any Portacath or PICC line.   We strive to give you quality time with your provider. You may need to reschedule your appointment if you arrive late (15 or more minutes).  Arriving late affects you and other patients whose appointments are after yours.  Also, if you miss three or more appointments without notifying the office, you may be dismissed from the clinic at the provider's discretion.      For prescription refill requests, have your pharmacy contact our office and allow 72 hours for refills to be completed.    Today you received the following chemotherapy and/or immunotherapy agents Ipilimumab Injection What is this medication? IPILIMUMAB (IP i LIM ue mab) treats some types of cancer. It works by helping your immune system slow or stop the spread of cancer cells. It is a monoclonal antibody. This medicine may be used for other purposes; ask your health care provider or pharmacist if you have questions. COMMON BRAND NAME(S): YERVOY What should I tell my care team before I take this medication? They need to know if you have any of these conditions: Allogeneic stem cell transplant (uses someone else's stem cells) Autoimmune diseases, such as Crohn disease, ulcerative colitis, lupus Nervous system problems, such as Guillain-Barre syndrome or myasthenia gravis Organ transplant An unusual or allergic reaction to ipilimumab, other medications, foods, dyes, or preservatives Pregnant or trying to get pregnant Breast-feeding How should I use this medication? This medication is infused into a vein. It is given by your care team in a  hospital or clinic setting. A special MedGuide will be given to you before each treatment. Be sure to read this information carefully each time. Talk to your care team about the use of this medication in children. While it may be prescribed for children as young as 12 years for selected conditions, precautions do apply. Overdosage: If you think you have taken too much of this medicine contact a poison control center or emergency room at once. NOTE: This medicine is only for you. Do not share this medicine with others. What if I miss a dose? Keep appointments for follow-up doses. It is important not to miss your dose. Call your care team if you are unable to keep an appointment. What may interact with this medication? Interactions are not expected. This list may not describe all possible interactions. Give your health care provider a list of all the medicines, herbs, non-prescription drugs, or dietary supplements you use. Also tell them if you smoke, drink alcohol, or use illegal drugs. Some items may interact with your medicine. What should I watch for while using this medication? Your condition will be monitored carefully while you are receiving this medication. You may need blood work while taking this medication. This medication may cause serious skin reactions. They can happen weeks to months after starting the medication. Contact your care team right away if you notice fevers or flu-like symptoms with a rash. The rash may be red or purple and then turn into blisters or peeling of the skin. You may also notice a red rash with swelling of the face, lips, or lymph nodes in your neck or  under your arms. Tell your care team right away if you have any change in your eyesight. Talk to your care team if you may be pregnant. Serious birth defects can occur if you take this medication during pregnancy and for 3 months after the last dose. You will need a negative pregnancy test before starting this  medication. Contraception is recommended while taking this medication and for 3 months after the last dose. Your care team can help you find the option that works for you. Do not breastfeed while taking this medication and for 3 months after the last dose. What side effects may I notice from receiving this medication? Side effects that you should report to your care team as soon as possible: Allergic reactions--skin rash, itching, hives, swelling of the face, lips, tongue, or throat Dry cough, shortness of breath or trouble breathing Eye pain, redness, irritation, or discharge with blurry or decreased vision Heart muscle inflammation--unusual weakness or fatigue, shortness of breath, chest pain, fast or irregular heartbeat, dizziness, swelling of the ankles, feet, or hands Hormone gland problems--headache, sensitivity to light, unusual weakness or fatigue, dizziness, fast or irregular heartbeat, increased sensitivity to cold or heat, excessive sweating, constipation, hair loss, increased thirst or amount of urine, tremors or shaking, irritability Infusion reactions--chest pain, shortness of breath or trouble breathing, feeling faint or lightheaded Kidney injury (glomerulonephritis)--decrease in the amount of urine, red or dark brown urine, foamy or bubbly urine, swelling of the ankles, hands, or feet Liver injury--right upper belly pain, loss of appetite, nausea, light-colored stool, dark yellow or brown urine, yellowing skin or eyes, unusual weakness or fatigue Pain, tingling, or numbness in the hands or feet, muscle weakness, change in vision, confusion or trouble speaking, loss of balance or coordination, trouble walking, seizures Rash, fever, and swollen lymph nodes Redness, blistering, peeling, or loosening of the skin, including inside the mouth Sudden or severe stomach pain, bloody diarrhea, fever, nausea, vomiting Side effects that usually do not require medical attention (report to your care  team if they continue or are bothersome): Bone, joint, or muscle pain Diarrhea Fatigue Loss of appetite Nausea Skin rash This list may not describe all possible side effects. Call your doctor for medical advice about side effects. You may report side effects to FDA at 1-800-FDA-1088. Where should I keep my medication? This medication is given in a hospital or clinic. It will not be stored at home. NOTE: This sheet is a summary. It may not cover all possible information. If you have questions about this medicine, talk to your doctor, pharmacist, or health care provider.  2023 Elsevier/Gold Standard (2021-12-20 00:00:00) Nivolumab Injection What is this medication? NIVOLUMAB (nye VOL ue mab) treats some types of cancer. It works by helping your immune system slow or stop the spread of cancer cells. It is a monoclonal antibody. This medicine may be used for other purposes; ask your health care provider or pharmacist if you have questions. COMMON BRAND NAME(S): Opdivo What should I tell my care team before I take this medication? They need to know if you have any of these conditions: Allogeneic stem cell transplant (uses someone else's stem cells) Autoimmune diseases, such as Crohn disease, ulcerative colitis, lupus History of chest radiation Nervous system problems, such as Guillain-Barre syndrome or myasthenia gravis Organ transplant An unusual or allergic reaction to nivolumab, other medications, foods, dyes, or preservatives Pregnant or trying to get pregnant Breast-feeding How should I use this medication? This medication is infused into a vein. It  is given in a hospital or clinic setting. A special MedGuide will be given to you before each treatment. Be sure to read this information carefully each time. Talk to your care team about the use of this medication in children. While it may be prescribed for children as young as 12 years for selected conditions, precautions do  apply. Overdosage: If you think you have taken too much of this medicine contact a poison control center or emergency room at once. NOTE: This medicine is only for you. Do not share this medicine with others. What if I miss a dose? Keep appointments for follow-up doses. It is important not to miss your dose. Call your care team if you are unable to keep an appointment. What may interact with this medication? Interactions have not been studied. This list may not describe all possible interactions. Give your health care provider a list of all the medicines, herbs, non-prescription drugs, or dietary supplements you use. Also tell them if you smoke, drink alcohol, or use illegal drugs. Some items may interact with your medicine. What should I watch for while using this medication? Your condition will be monitored carefully while you are receiving this medication. You may need blood work while taking this medication. This medication may cause serious skin reactions. They can happen weeks to months after starting the medication. Contact your care team right away if you notice fevers or flu-like symptoms with a rash. The rash may be red or purple and then turn into blisters or peeling of the skin. You may also notice a red rash with swelling of the face, lips, or lymph nodes in your neck or under your arms. Tell your care team right away if you have any change in your eyesight. Talk to your care team if you are pregnant or think you might be pregnant. A negative pregnancy test is required before starting this medication. A reliable form of contraception is recommended while taking this medication and for 5 months after the last dose. Talk to your care team about effective forms of contraception. Do not breast-feed while taking this medication and for 5 months after the last dose. What side effects may I notice from receiving this medication? Side effects that you should report to your care team as soon as  possible: Allergic reactions--skin rash, itching, hives, swelling of the face, lips, tongue, or throat Dry cough, shortness of breath or trouble breathing Eye pain, redness, irritation, or discharge with blurry or decreased vision Heart muscle inflammation--unusual weakness or fatigue, shortness of breath, chest pain, fast or irregular heartbeat, dizziness, swelling of the ankles, feet, or hands Hormone gland problems--headache, sensitivity to light, unusual weakness or fatigue, dizziness, fast or irregular heartbeat, increased sensitivity to cold or heat, excessive sweating, constipation, hair loss, increased thirst or amount of urine, tremors or shaking, irritability Infusion reactions--chest pain, shortness of breath or trouble breathing, feeling faint or lightheaded Kidney injury (glomerulonephritis)--decrease in the amount of urine, red or dark brown urine, foamy or bubbly urine, swelling of the ankles, hands, or feet Liver injury--right upper belly pain, loss of appetite, nausea, light-colored stool, dark yellow or brown urine, yellowing skin or eyes, unusual weakness or fatigue Pain, tingling, or numbness in the hands or feet, muscle weakness, change in vision, confusion or trouble speaking, loss of balance or coordination, trouble walking, seizures Rash, fever, and swollen lymph nodes Redness, blistering, peeling, or loosening of the skin, including inside the mouth Sudden or severe stomach pain, bloody diarrhea, fever, nausea,  vomiting Side effects that usually do not require medical attention (report these to your care team if they continue or are bothersome): Bone, joint, or muscle pain Diarrhea Fatigue Loss of appetite Nausea Skin rash This list may not describe all possible side effects. Call your doctor for medical advice about side effects. You may report side effects to FDA at 1-800-FDA-1088. Where should I keep my medication? This medication is given in a hospital or clinic. It  will not be stored at home. NOTE: This sheet is a summary. It may not cover all possible information. If you have questions about this medicine, talk to your doctor, pharmacist, or health care provider.  2023 Elsevier/Gold Standard (2021-07-08 00:00:00)    To help prevent nausea and vomiting after your treatment, we encourage you to take your nausea medication as directed.  BELOW ARE SYMPTOMS THAT SHOULD BE REPORTED IMMEDIATELY: *FEVER GREATER THAN 100.4 F (38 C) OR HIGHER *CHILLS OR SWEATING *NAUSEA AND VOMITING THAT IS NOT CONTROLLED WITH YOUR NAUSEA MEDICATION *UNUSUAL SHORTNESS OF BREATH *UNUSUAL BRUISING OR BLEEDING *URINARY PROBLEMS (pain or burning when urinating, or frequent urination) *BOWEL PROBLEMS (unusual diarrhea, constipation, pain near the anus) TENDERNESS IN MOUTH AND THROAT WITH OR WITHOUT PRESENCE OF ULCERS (sore throat, sores in mouth, or a toothache) UNUSUAL RASH, SWELLING OR PAIN  UNUSUAL VAGINAL DISCHARGE OR ITCHING   Items with * indicate a potential emergency and should be followed up as soon as possible or go to the Emergency Department if any problems should occur.  Please show the CHEMOTHERAPY ALERT CARD or IMMUNOTHERAPY ALERT CARD at check-in to the Emergency Department and triage nurse.  Should you have questions after your visit or need to cancel or reschedule your appointment, please contact [facility]  Dept: [phone]  and follow the prompts.  Office hours are 8:00 a.m. to 4:30 p.m. Monday - Friday. Please note that voicemails left after 4:00 p.m. may not be returned until the following business day.  We are closed weekends and major holidays. You have access to a nurse at all times for urgent questions. Please call the main number to the clinic Dept: [phone] and follow the prompts.  For any non-urgent questions, you may also contact your provider using MyChart. We now offer e-Visits for anyone 51 and older to request care  online for non-urgent symptoms. For details visit [email or web address].   Also download the MyChart app! Go to the app store, search "MyChart", open the app, select [facility], and log in with your MyChart username and password.  Masks are optional in the cancer centers. If you would like for your care team to wear a mask while they are taking care of you, please let them know. You may have one support person who is at least 75 years old accompany you for your appointments.

## 2022-04-17 ENCOUNTER — Telehealth: Payer: Self-pay

## 2022-04-17 NOTE — Telephone Encounter (Signed)
I spoke with pt. He states, "I'm doing fine". He denies any side effects. I reminded him to call us if he develops temp of 100.4 or higher, day or night. He verbalized understanding.

## 2022-04-18 ENCOUNTER — Encounter: Payer: Self-pay | Admitting: Oncology

## 2022-04-18 DIAGNOSIS — I10 Essential (primary) hypertension: Secondary | ICD-10-CM | POA: Diagnosis not present

## 2022-04-18 DIAGNOSIS — Z483 Aftercare following surgery for neoplasm: Secondary | ICD-10-CM | POA: Diagnosis not present

## 2022-04-18 DIAGNOSIS — Z7902 Long term (current) use of antithrombotics/antiplatelets: Secondary | ICD-10-CM | POA: Diagnosis not present

## 2022-04-18 DIAGNOSIS — K219 Gastro-esophageal reflux disease without esophagitis: Secondary | ICD-10-CM | POA: Diagnosis not present

## 2022-04-18 DIAGNOSIS — Z87891 Personal history of nicotine dependence: Secondary | ICD-10-CM | POA: Diagnosis not present

## 2022-04-18 DIAGNOSIS — Z8673 Personal history of transient ischemic attack (TIA), and cerebral infarction without residual deficits: Secondary | ICD-10-CM | POA: Diagnosis not present

## 2022-04-18 DIAGNOSIS — Z7982 Long term (current) use of aspirin: Secondary | ICD-10-CM | POA: Diagnosis not present

## 2022-04-18 DIAGNOSIS — C4372 Malignant melanoma of left lower limb, including hip: Secondary | ICD-10-CM | POA: Diagnosis not present

## 2022-04-20 DIAGNOSIS — Z4889 Encounter for other specified surgical aftercare: Secondary | ICD-10-CM | POA: Diagnosis not present

## 2022-04-25 ENCOUNTER — Encounter: Payer: Self-pay | Admitting: Hematology and Oncology

## 2022-04-25 ENCOUNTER — Inpatient Hospital Stay: Payer: Medicare HMO

## 2022-04-25 ENCOUNTER — Inpatient Hospital Stay: Payer: Medicare HMO | Attending: Oncology | Admitting: Hematology and Oncology

## 2022-04-25 DIAGNOSIS — C792 Secondary malignant neoplasm of skin: Secondary | ICD-10-CM

## 2022-04-25 DIAGNOSIS — Z79899 Other long term (current) drug therapy: Secondary | ICD-10-CM | POA: Insufficient documentation

## 2022-04-25 DIAGNOSIS — D649 Anemia, unspecified: Secondary | ICD-10-CM | POA: Diagnosis not present

## 2022-04-25 DIAGNOSIS — C439 Malignant melanoma of skin, unspecified: Secondary | ICD-10-CM

## 2022-04-25 DIAGNOSIS — Z5112 Encounter for antineoplastic immunotherapy: Secondary | ICD-10-CM | POA: Insufficient documentation

## 2022-04-25 DIAGNOSIS — C4372 Malignant melanoma of left lower limb, including hip: Secondary | ICD-10-CM | POA: Insufficient documentation

## 2022-04-25 LAB — COMPREHENSIVE METABOLIC PANEL
Albumin: 4 (ref 3.5–5.0)
Calcium: 8.7 (ref 8.7–10.7)

## 2022-04-25 LAB — BASIC METABOLIC PANEL
BUN: 25 — AB (ref 4–21)
CO2: 24 — AB (ref 13–22)
Chloride: 107 (ref 99–108)
Creatinine: 0.9 (ref 0.6–1.3)
Glucose: 117
Potassium: 4.1 mEq/L (ref 3.5–5.1)
Sodium: 138 (ref 137–147)

## 2022-04-25 LAB — CBC AND DIFFERENTIAL
HCT: 40 — AB (ref 41–53)
Hemoglobin: 13.5 (ref 13.5–17.5)
Neutrophils Absolute: 9.15
Platelets: 262 10*3/uL (ref 150–400)
WBC: 14.3

## 2022-04-25 LAB — HEPATIC FUNCTION PANEL
ALT: 23 U/L (ref 10–40)
AST: 27 (ref 14–40)
Alkaline Phosphatase: 93 (ref 25–125)
Bilirubin, Total: 0.7

## 2022-04-25 LAB — CBC
MCV: 83 (ref 80–94)
RBC: 4.88 (ref 3.87–5.11)

## 2022-04-25 MED ORDER — GABAPENTIN 300 MG PO CAPS: 300.0000 mg | ORAL_CAPSULE | Freq: Three times a day (TID) | ORAL | 2 refills | Status: DC

## 2022-04-25 MED ORDER — OXYCODONE HCL 5 MG PO TABS: 5.0000 mg | ORAL_TABLET | Freq: Four times a day (QID) | ORAL | 0 refills | Status: DC | PRN

## 2022-04-25 NOTE — Progress Notes (Signed)
[facility]  [address] 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  The left calf lesion was consistent with metastatic melanoma.  CT staging revealed multiple pulmonary nodules concerning for metastatic disease.  PET scan revealed increased uptake in the left foot.  The pulmonary nodules were suboptimally evaluated, but still highly suspicious for metastatic disease. He is receiving palliative ipilumumab/nivolumab.  He tolerated his 1st cycle well.  He will proceed with a 2nd cycle this week.  We will plan to see him back in 3 weeks for repeat clinical assessment prior to a 3rd cycle.   The patient understands the plans discussed today and is in agreement with them.  He knows to contact our office if he develops concerns prior to his next appointment.    Marvia Pickles, PA-C  [facility] AT [facility] [address] [place] [address] Dept: [phone] Dept Fax: [phone]   Orders Placed This Encounter  Procedures   CBC and differential    This external order was created through the Results Console.   CBC    This external order was created through the Results Console.   Basic metabolic panel    This external order was created through the Results Console.   Comprehensive metabolic panel    This external order was created through the Results Console.   Hepatic function panel    This external order was created through the Results Console.   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  Current Treatment:  Ipilimumab/nivolumab  HISTORY OF PRESENT ILLNESS:   Oncology History  Malignant melanoma, metastatic ([place])  01/23/2022 Cancer Staging   Staging form: Melanoma of the Skin, AJCC 8th Edition - Clinical stage from 01/23/2022: Stage III (cT2b, cN1c, cM0) - Signed by Derwood Kaplan, MD on 04/07/2022 Histopathologic type: Malignant melanoma, NOS (except juvenile melanoma M-8770/[id number]) Stage prefix: Initial diagnosis Laterality: Left Lymph-vascular invasion (LVI): LVI not present (absent)/not identified Diagnostic confirmation: Positive histology Specimen type: Biopsy / Limited Resection Staged by: Managing physician Mitotic count: 4 Mitotic unit: mm2 Clark's level: Level IV Tumor-infiltrating lymphocytes: Present and non-brisk Breslow depth (mm): 16 Ulceration of the epidermis: Yes Microsatellites: No Primary tumor regression: Absent Intransit/satellite metastasis: In-transit Lymph node clinically or radiologically detected: No Microscopic confirmation of metastasis: No Sentinel lymph node biopsy performed: No Completion or therapeutic lymph node dissection performed: No Matted nodes: No Stage used in treatment planning: Yes National guidelines used in treatment planning: Yes Type of national guideline used in treatment planning: NCCN   01/31/2022 Initial Diagnosis   Malignant melanoma, metastatic ([place])   04/14/2022 -  Chemotherapy   Patient is on Treatment Plan : MELANOMA Nivolumab (3) + Ipilimumab (1) q21d / Nivolumab (240) q14d     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[place])  02/28/2022 Cancer Staging   Staging form: Melanoma of the Skin, AJCC 8th Edition - Clinical stage from 02/28/2022: Stage IV (cT2b(m), cN1c, cM1a) - Signed by Derwood Kaplan, MD on 04/07/2022  Histopathologic type: Malignant melanoma, NOS (except juvenile melanoma M-8770/[id number]) Stage prefix: Initial diagnosis Laterality: Left Multiple tumors:  Yes Lymph-vascular invasion (LVI): LVI present/identified, NOS Diagnostic confirmation: Positive histology Specimen type: Excision Staged by: Managing physician Intransit/satellite metastasis: Both in-transit and satellite Prognostic indicators: 02/28/22:  Wide excision of primary of foot - no residual melanoma,  Excision of skin of calf consistent with intransit mets of dermal/subcuticular   Melanoma with LVI, metastatic disease with multifocal lesions and multifocal positive margins 03/24/22: Re-excision with a few atypical melanocytes, no melanoma and clear margins  04/07/2022 Initial Diagnosis   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  He states he tolerated his 1st cycle without difficulty.  He denies cough, shortness of breath, diarrhea or skin rash.  He states his foot and leg wounds are healing.  He continues to follow with Dr. Coralie Keens and sees him again next week. He denies fevers or chills. He denies pain. His appetite is good. His weight has been stable.  He requests a refill of gabapentin and oxycodone.  REVIEW OF SYSTEMS:  Review of Systems  Constitutional:  Negative for appetite change, chills, fatigue, fever and unexpected weight change.  HENT:   Negative for lump/mass, mouth sores and sore throat.   Respiratory:  Negative for cough and shortness of breath.   Cardiovascular:  Negative for chest pain and leg swelling.  Gastrointestinal:  Negative for abdominal pain, constipation, diarrhea, nausea and vomiting.  Genitourinary:  Negative for difficulty urinating, dysuria, frequency and hematuria.   Musculoskeletal:  Positive for back pain (chronic, stable). Negative for arthralgias and myalgias.  Skin:  Positive  for wound (left foot and leg). Negative for itching and rash.  Neurological:  Negative for dizziness, extremity weakness, headaches, light-headedness and numbness.  Hematological:  Negative for adenopathy.  Psychiatric/Behavioral:  Negative for depression and sleep disturbance. The patient is not nervous/anxious.      VITALS:  Blood pressure (!) 145/65, pulse 89, temperature 97.7 F (36.5 C), temperature source Oral, resp. rate 20, height 5' 9.7" (1.77 m), weight 229 lb 11.2 oz (104.2 kg), SpO2 94 %.  Wt Readings from Last 3 Encounters:  04/25/22 229 lb 11.2 oz (104.2 kg)  04/14/22 230 lb 1.9 oz (104.4 kg)  04/07/22 228 lb 11.2 oz (103.7 kg)    Body mass index is 33.24 kg/m.  Performance status (ECOG): 0 - Asymptomatic  PHYSICAL EXAM:  Physical Exam Vitals and nursing note reviewed.  Constitutional:      General: He is not in acute distress.    Appearance: Normal appearance. He is normal weight.  HENT:     Head: Normocephalic and atraumatic.     Mouth/Throat:     Mouth: Mucous membranes are moist.     Pharynx: Oropharynx is clear. No oropharyngeal exudate or posterior oropharyngeal erythema.  Eyes:     General: No scleral icterus.    Extraocular Movements: Extraocular movements intact.     Conjunctiva/sclera: Conjunctivae normal.     Pupils: Pupils are equal, round, and reactive to light.  Cardiovascular:     Rate and Rhythm: Normal rate and regular rhythm.     Heart sounds: Normal heart sounds. No murmur heard.    No friction  rub. No gallop.  Pulmonary:     Effort: Pulmonary effort is normal.     Breath sounds: Normal breath sounds. No wheezing, rhonchi or rales.  Abdominal:     General: Bowel sounds are normal. There is no distension.     Palpations: Abdomen is soft. There is no hepatomegaly, splenomegaly or mass.     Tenderness: There is no abdominal tenderness.  Musculoskeletal:        General: Normal range of motion.     Cervical back: Normal range of motion and  neck supple. No tenderness.     Right lower leg: No edema.     Left lower leg: No edema.  Lymphadenopathy:     Cervical: No cervical adenopathy.     Upper Body:     Right upper body: No supraclavicular or axillary adenopathy.     Left upper body: No supraclavicular or axillary adenopathy.     Lower Body: No right inguinal adenopathy. No left inguinal adenopathy.  Skin:    General: Skin is warm and dry.     Coloration: Skin is not jaundiced.     Findings: No rash.  Neurological:     Mental Status: He is alert and oriented to person, place, and time.     Cranial Nerves: No cranial nerve deficit.  Psychiatric:        Mood and Affect: Mood normal.        Behavior: Behavior normal.        Thought Content: Thought content normal.     LABS:      Latest Ref Rng & Units 04/25/2022   12:00 AM 04/06/2022   12:00 AM 02/24/2022   12:00 AM  CBC  WBC  14.3     16.6     12.1      Hemoglobin 13.5 - 17.5 13.5     13.3     13.3      Hematocrit 41 - 53 40     40     40      Platelets 150 - 400 K/uL 262     280     261         This result is from an external source.      Latest Ref Rng & Units 04/25/2022   12:00 AM 04/06/2022   12:00 AM 02/24/2022   12:00 AM  CMP  BUN 4 - '21 25     21     '$ 32      Creatinine 0.6 - 1.3 0.9     0.8     0.8      Sodium 137 - 147 138     137     141      Potassium 3.5 - 5.1 mEq/L 4.1     3.8     4.1      Chloride 99 - 108 107     104     106      CO2 13 - '22 24     25     27      '$ Calcium 8.7 - 10.7 8.7     8.7     9.1      Alkaline Phos 25 - 125 93     134     107      AST 14 - 40 '27     22     26      '$ ALT 10 - 40 U/L 23  20     21         This result is from an external source.     No results found for: "CEA1", "CEA" / No results found for: "CEA1", "CEA" No results found for: "PSA1" No results found for: "QQI297" No results found for: "CAN125"  No results found for: "TOTALPROTELP", "ALBUMINELP", "A1GS", "A2GS", "BETS", "BETA2SER", "GAMS", "MSPIKE",  "SPEI" No results found for: "TIBC", "FERRITIN", "IRONPCTSAT" Lab Results  Component Value Date   LDH 160 02/07/2022    STUDIES:  No results found.    HISTORY:   Past Medical History:  Diagnosis Date   Arthritis    History of colon polyps    History of kidney stones    Hypercholesteremia    Hypertension    Peptic ulcer    Stroke 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     Past Surgical History:  Procedure Laterality Date   LUMBAR LAMINECTOMY/DECOMPRESSION MICRODISCECTOMY N/A 01/26/2022   Procedure: Microlumbar decompression Lumbar four-five, lateral mass fusion with autograft and allograft bone;  Surgeon: Susa Day, MD;  Location: [place];  Service: Orthopedics;  Laterality: N/A;   PARTIAL GASTRECTOMY     TOTAL HIP ARTHROPLASTY Right 06/18/2018   Procedure: RIGHT TOTAL HIP ARTHROPLASTY ANTERIOR APPROACH;  Surgeon: [name], MD;  Location: [facility];  Service: Orthopedics;  Laterality: Right;    Family History  Problem Relation Age of Onset   Melanoma Brother     Social History:  reports that he has quit smoking. He has never used smokeless tobacco. He reports current alcohol use. He reports that he does not use drugs.The patient is accompanied by his wife today.  Allergies:  Allergies  Allergen Reactions   Morphine Itching    Patient states it is tolerable itching    Current Medications: Current Outpatient Medications  Medication Sig Dispense Refill   gabapentin (NEURONTIN) 300 MG capsule Take 1 capsule (300 mg total) by mouth 3 (three) times daily. 90 capsule 2   amLODipine (NORVASC) 5 MG tablet Take 5 mg by mouth daily.     clopidogrel (PLAVIX) 75 MG tablet Take 1 tablet by mouth daily.     docusate sodium (COLACE) 100 MG capsule 1 capsule 2 TIMES DAILY (route: oral)     famotidine (PEPCID) 20 MG tablet Take 20 mg by mouth at bedtime as needed for heartburn or indigestion.     Flaxseed, Linseed, (FLAXSEED  OIL) 1000 MG CAPS Take 1,000 mg by mouth daily.     folic acid (FOLVITE) 989 MCG tablet Take 400 mcg by mouth daily.     hydrochlorothiazide (HYDRODIURIL) 12.5 MG tablet Take 12.5 mg by mouth daily.     lisinopril (ZESTRIL) 20 MG tablet      Multiple Vitamin (MULTIVITAMIN WITH MINERALS) TABS tablet Take 1 tablet by mouth in the morning.     Omega-3 1000 MG CAPS Take by mouth.     ondansetron (ZOFRAN) 4 MG tablet Take 1 tablet (4 mg total) by mouth every 4 (four) hours as needed for nausea. 90 tablet 3   oxyCODONE (OXY IR/ROXICODONE) 5 MG immediate release tablet Take 1 tablet (5 mg total) by mouth every 6 (six) hours as needed for severe pain. 30 tablet 0   polyethylene glycol (MIRALAX / GLYCOLAX) 17 g packet Take 17 g by mouth daily. 14 each 0   pravastatin (PRAVACHOL) 40 MG tablet Take 40 mg by mouth daily.  prochlorperazine (COMPAZINE) 10 MG tablet Take 1 tablet (10 mg total) by mouth every 6 (six) hours as needed for nausea or vomiting. 90 tablet 3   traZODone (DESYREL) 50 MG tablet Take 50 mg by mouth at bedtime as needed for sleep.     No current facility-administered medications for this visit.

## 2022-04-25 NOTE — Assessment & Plan Note (Addendum)
Melanoma of the left foot and left calf diagnosed on shave biopsy at dermatology in June.  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  The left calf lesion was consistent with metastatic melanoma.  CT staging revealed multiple pulmonary nodules concerning for metastatic disease.  PET scan revealed increased uptake in the left foot.  The pulmonary nodules were suboptimally evaluated, but still highly suspicious for metastatic disease. He is receiving palliative ipilumumab/nivolumab.  He tolerated his 1st cycle well.  He will proceed with a 2nd cycle this week.  We will plan to see him back in 3 weeks for repeat clinical assessment prior to a 3rd cycle.

## 2022-05-01 ENCOUNTER — Encounter: Payer: Self-pay | Admitting: Oncology

## 2022-05-02 DIAGNOSIS — I10 Essential (primary) hypertension: Secondary | ICD-10-CM | POA: Diagnosis not present

## 2022-05-02 DIAGNOSIS — Z7902 Long term (current) use of antithrombotics/antiplatelets: Secondary | ICD-10-CM | POA: Diagnosis not present

## 2022-05-02 DIAGNOSIS — Z87891 Personal history of nicotine dependence: Secondary | ICD-10-CM | POA: Diagnosis not present

## 2022-05-02 DIAGNOSIS — K219 Gastro-esophageal reflux disease without esophagitis: Secondary | ICD-10-CM | POA: Diagnosis not present

## 2022-05-02 DIAGNOSIS — Z483 Aftercare following surgery for neoplasm: Secondary | ICD-10-CM | POA: Diagnosis not present

## 2022-05-02 DIAGNOSIS — C4372 Malignant melanoma of left lower limb, including hip: Secondary | ICD-10-CM | POA: Diagnosis not present

## 2022-05-02 DIAGNOSIS — Z8673 Personal history of transient ischemic attack (TIA), and cerebral infarction without residual deficits: Secondary | ICD-10-CM | POA: Diagnosis not present

## 2022-05-02 DIAGNOSIS — Z7982 Long term (current) use of aspirin: Secondary | ICD-10-CM | POA: Diagnosis not present

## 2022-05-02 NOTE — Progress Notes (Signed)
[name]  [address] [place],  [place]  [address] [phone]  Clinic Day: 05/04/2022  Referring physician: Consuello Closs, MD   ASSESSMENT & PLAN:   Malignant melanoma of the left foot He has had a punch biopsy and this reveals a Breslow thickness of 1.6 mm and Clark's level 4 with ulceration for a T2b N0 M0 clinically.  We now find no distant metastasis on PET scan.  His tumor is BRAF positive for mutation.  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  Multiple lesions of the lung These are suspicious for metastatic disease.  The PET scan is negative but we cannot absolutely rule out metastatic disease since these lesions are small.  Spinal stenosis at L4-5 He has had surgical decompression and resection of a synovial cyst as of June 8.  Gouty arthritis He had an acute flare last week involving primarily his elbow.  Leukocytosis He has had a leukocytosis for months but his white count now has increased from 14-31,000 with 81% neutrophils and so appears to be a leukemoid reaction.  He has no symptoms of infection but I will check a urinalysis and urine culture.  It could perhaps be related to his wound but this has been recently checked by the surgeon.   He is tolerating the immunotherapy well.  He will proceed with his second cycle.  I am checking a urinalysis and urine culture.  He has been instructed to watch for any symptoms of infection, and call us if he has fever.  His tumor is positive for BRAF mutation. PDL1 total score was 90%.  His last surgery was 03/24/2022, and he still has a wound that is being followed by the surgeon. I will plan to see him back in 3 weeks with CBC, CMP, T4 and TSH.  I will have him increase his famotidine from 20 mg daily to 40 mg daily.  I discussed the assessment and treatment plan with the patient.  The  patient and his family were provided an opportunity to ask questions and all were answered.  The patient agreed with the plan and demonstrated an understanding of the instructions.  The patient was advised to call back if the symptoms worsen or if the condition fails to improve as anticipated.   I provided 30 minutes of face-to-face time during this this encounter and > 50% was spent counseling as documented under my assessment and plan.    Derwood Kaplan, MD [facility] [address] [place] [address] [place] [address] Dept: [phone] Dept Fax: [phone]   CHIEF COMPLAINT:  CC: Malignant melanoma  Current Treatment: Evaluation and immunotherapy   HISTORY OF PRESENT ILLNESS:  [name] is a 76 y.o. male with a history of malignant melanoma who is referred in consultation with Dr. Bertram Millard for assessment and management.  He had a lesion of the dorsum of the left foot in September 2022 which appeared to be a granuloma and was treated in the office.  He later developed a another lesion lateral to this on the dorsum of the left foot which was not hyperpigmented but did bleed easily.  He also has several lesions of his left medial calf which are nodular and span approximately 6 cm.  He has had some increased edema of the left lower leg.  He was evaluated  by dermatology on June 5, who felt this could be a squamous cell carcinoma versus tinea or a drug reaction.  He had biopsies done of both the foot lesion and the Lesion.  Pathology came back that the left dorsal foot shave biopsy revealed a malignant melanoma possibly a primary nodular ulcerated melanoma which had a Breslow thickness of at least 1.6 mm and a Clark's level 4.  Margins are positive as this was a shave biopsy.  Ulceration is present but no satellitosis.  The mitotic index is 4/mm and no lymphovascular invasion was identified.  No brisk tumor infiltrating lymphocytes are  seen and tumor regression is absent.  This is felt to be at least a T2b lesion and wide excision was recommended.  The lesion of the left medial calf revealed a dermal map melanoma most consistent with metastatic disease.  This information was not available when the patient was admitted for back surgery on June 8 and he had a microlumbar decompression at L4-5 for spinal stenosis.  He was also found to have a synovial cyst at that time which was resected and benign.  He was readmitted several days later for acute pain of the right elbow and both arms.  Aspiration was obtained and this was diagnosed as gouty arthritis.  He did have temporary rehab after his back surgery.  His left leg was evaluated for deep venous thrombosis and was negative.  While he was recuperating from his surgery he had an x-ray which was abnormal which led to a CT scan which revealed at least 3 lung lesions.  MRI of the brain was negative and he is now referred for management of the malignant melanoma.  INTERVAL HISTORY:  I have reviewed his chart and materials related to his cancer extensively and collaborated history with the patient. Summary of oncologic history is as follows: Oncology History  Malignant melanoma, metastatic ([place])  01/23/2022 Cancer Staging   Staging form: Melanoma of the Skin, AJCC 8th Edition - Clinical stage from 01/23/2022: Stage III (cT2b, cN1c, cM0) - Signed by Derwood Kaplan, MD on 04/07/2022 Histopathologic type: Malignant melanoma, NOS (except juvenile melanoma M-8770/[id number]) Stage prefix: Initial diagnosis Laterality: Left Lymph-vascular invasion (LVI): LVI not present (absent)/not identified Diagnostic confirmation: Positive histology Specimen type: Biopsy / Limited Resection Staged by: Managing physician Mitotic count: 4 Mitotic unit: mm2 Clark's level: Level IV Tumor-infiltrating lymphocytes: Present and non-brisk Breslow depth (mm): 16 Ulceration of the epidermis: Yes Microsatellites:  No Primary tumor regression: Absent Intransit/satellite metastasis: In-transit Lymph node clinically or radiologically detected: No Microscopic confirmation of metastasis: No Sentinel lymph node biopsy performed: No Completion or therapeutic lymph node dissection performed: No Matted nodes: No Stage used in treatment planning: Yes National guidelines used in treatment planning: Yes Type of national guideline used in treatment planning: NCCN   01/31/2022 Initial Diagnosis   Malignant melanoma, metastatic ([place])   04/14/2022 -  Chemotherapy   Patient is on Treatment Plan : MELANOMA Nivolumab (3) + Ipilimumab (1) q21d / Nivolumab (240) q14d     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[place])  02/28/2022 Cancer Staging   Staging form: Melanoma of the Skin, AJCC 8th Edition - Clinical stage from 02/28/2022: Stage IV (cT2b(m), cN1c, cM1a) - Signed by Derwood Kaplan, MD on 04/07/2022 Histopathologic type: Malignant melanoma, NOS (except juvenile melanoma M-8770/[id number]) Stage prefix: Initial diagnosis Laterality: Left Multiple tumors: Yes Lymph-vascular invasion (LVI): LVI present/identified, NOS Diagnostic confirmation: Positive histology Specimen type: Excision Staged by: Managing physician Intransit/satellite metastasis: Both in-transit  and satellite Prognostic indicators: 02/28/22:  Wide excision of primary of foot - no residual melanoma,  Excision of skin of calf consistent with intransit mets of dermal/subcuticular   Melanoma with LVI, metastatic disease with multifocal lesions and multifocal positive margins 03/24/22: Re-excision with a few atypical melanocytes, no melanoma and clear margins   04/07/2022 Initial Diagnosis   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 He continues to have edema of the left leg.  The wound is coming along. He reports his back is feeling better, it does have its moments but overall has improved. His appetite is good. He has gained 4 pounds since his last visit.  His labs today reveal a dramatic increase in his leukocytosis from 14,000 to 31,000.  This is 81% neutrophils.  Yet he denies any symptoms of infection or fever. The rest of his labs are unremarkable. He denies hemoptysis, cough, chest pain, dyspnea, or wheezing. He denies fever, chills, night sweats, or other signs of infection. He denies cardiorespiratory and gastrointestinal issues.  He denies any dysuria or hematuria.   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 with in-transit metastases  Past Surgical History:  Procedure Laterality Date   LUMBAR LAMINECTOMY/DECOMPRESSION MICRODISCECTOMY N/A 01/26/2022   Procedure: Microlumbar decompression Lumbar four-five, lateral mass fusion with autograft and allograft bone;  Surgeon: Susa Day, MD;  Location: [facility];  Service: Orthopedics;  Laterality: N/A;   PARTIAL GASTRECTOMY     TOTAL HIP ARTHROPLASTY Right 06/18/2018   Procedure: RIGHT TOTAL HIP ARTHROPLASTY ANTERIOR APPROACH;  Surgeon: [name], MD;  Location: [facility];  Service: Orthopedics;  Laterality: Right;    Family History  Problem Relation Age of Onset   Melanoma Brother     Social History:  reports that he has quit smoking. He has never used smokeless tobacco. He reports that he does not currently use alcohol. He reports that he does not use drugs.The patient is married and accompanied by his wife today.  His daughter-in-law  Freida Busman is also here today.  They drink about 1 glass of wine daily.  They have a son and daughter.  He is retired from working with [employer] and did have chemical exposures with that.  Allergies:  Allergies  Allergen Reactions   Morphine Itching    Patient states it is tolerable itching    Current Medications: Current Outpatient Medications  Medication Sig Dispense Refill   famotidine (PEPCID) 40 MG tablet Take 1 tablet (40 mg total) by mouth daily. 30 tablet 5   amLODipine (NORVASC) 5 MG tablet Take 5 mg by mouth daily.     clopidogrel (PLAVIX) 75 MG tablet Take 1 tablet by mouth daily.     docusate sodium (COLACE) 100 MG capsule 1 capsule 2 TIMES DAILY (route: oral)     Flaxseed, Linseed, (FLAXSEED OIL) 1000  MG CAPS Take 1,000 mg by mouth daily.     folic acid (FOLVITE) 379 MCG tablet Take 400 mcg by mouth daily.     gabapentin (NEURONTIN) 300 MG capsule Take 1 capsule (300 mg total) by mouth 3 (three) times daily. 90 capsule 2   hydrochlorothiazide (HYDRODIURIL) 12.5 MG tablet Take 12.5 mg by mouth daily.     lisinopril (ZESTRIL) 20 MG tablet      Multiple Vitamin (MULTIVITAMIN WITH MINERALS) TABS tablet Take 1 tablet by mouth in the morning.     Omega-3 1000 MG CAPS Take by mouth.     ondansetron (ZOFRAN) 4 MG tablet Take 1 tablet (4 mg total) by mouth every 4 (four) hours as needed for nausea. 90 tablet 3   oxyCODONE (OXY IR/ROXICODONE) 5 MG immediate release tablet Take 1 tablet (5 mg total) by mouth every 6 (six) hours as needed for severe pain. 30 tablet 0   polyethylene glycol (MIRALAX / GLYCOLAX) 17 g packet Take 17 g by mouth daily. 14 each 0   pravastatin (PRAVACHOL) 40 MG tablet Take 40 mg by mouth daily.     prochlorperazine (COMPAZINE) 10 MG tablet Take 1 tablet (10 mg total) by mouth every 6 (six) hours as needed for nausea or vomiting. 90 tablet 3   traZODone (DESYREL) 50 MG tablet Take 50 mg by mouth at bedtime as needed for sleep.     No current facility-administered  medications for this visit.    REVIEW OF SYSTEMS:  Review of Systems  HENT:  Negative.    Eyes: Negative.   Cardiovascular:  Negative for leg swelling.  Gastrointestinal: Negative.   Endocrine: Negative.   Genitourinary: Negative.    Musculoskeletal:  Positive for arthralgias and back pain.  Skin:  Positive for wound.       He has a lesion on the dorsum of the left foot as well as some nodules of the medial left calf.  Neurological: Negative.   Hematological: Negative.   Psychiatric/Behavioral: Negative.        VITALS:  Blood pressure (!) 157/67, pulse 85, temperature 98 F (36.7 C), temperature source Oral, resp. rate 18, height 5' 9.69" (1.77 m), weight 233 lb 6.4 oz (105.9 kg), SpO2 95 %.  Wt Readings from Last 3 Encounters:  05/08/22 233 lb 1.9 oz (105.7 kg)  05/04/22 233 lb 6.4 oz (105.9 kg)  04/25/22 229 lb 11.2 oz (104.2 kg)    Body mass index is 33.79 kg/m.  Performance status (ECOG): 1 - Symptomatic but completely ambulatory  PHYSICAL EXAM:  Physical Exam Constitutional:      Appearance: Normal appearance.  HENT:     Head: Normocephalic and atraumatic.     Nose: Nose normal.     Mouth/Throat:     Pharynx: Oropharynx is clear.  Eyes:     Extraocular Movements: Extraocular movements intact.     Conjunctiva/sclera: Conjunctivae normal.     Pupils: Pupils are equal, round, and reactive to light.  Cardiovascular:     Rate and Rhythm: Normal rate and regular rhythm.     Pulses: Normal pulses.     Heart sounds: Normal heart sounds. No murmur heard.    No gallop.  Pulmonary:     Breath sounds: Normal breath sounds.  Abdominal:     General: Bowel sounds are normal.     Palpations: Abdomen is soft.     Tenderness: There is no abdominal tenderness.  Musculoskeletal:     Cervical back: Normal range of  motion.     Left lower leg: Edema present.     Comments: 1+ edema  Skin:    General: Skin is warm and dry.     Findings: Lesion present.     Comments: He has  a scaly excoriated lesion of the lateral dorsum of the left foot.  This appears ulcerated.  He has a cluster of several nodules of the medial left calf which are firm and purpleish in color and spanning a length of 6 cm  Neurological:     General: No focal deficit present.     Mental Status: He is alert and oriented to person, place, and time.  Psychiatric:        Mood and Affect: Mood normal.        Behavior: Behavior normal.        Thought Content: Thought content normal.        Judgment: Judgment normal.     LABS:      Latest Ref Rng & Units 05/04/2022   12:00 AM 04/25/2022   12:00 AM 04/06/2022   12:00 AM  CBC  WBC  31.0     14.3     16.6      Hemoglobin 13.5 - 17.5 13.4     13.5     13.3      Hematocrit 41 - 53 40     40     40      Platelets 150 - 400 K/uL 230     262     280         This result is from an external source.      Latest Ref Rng & Units 05/04/2022   12:00 AM 04/25/2022   12:00 AM 04/06/2022   12:00 AM  CMP  BUN 4 - _0 Creatinine 0.6 - 1.3 0.8     0.9     0.8      Sodium 137 - 147 137     138     137      Potassium 3.5 - 5.1 mEq/L 4.1     4.1     3.8      Chloride 99 - 108 103     107     104      CO2 13 - _1 Calcium 8.7 - 10.7 9.1     8.7     8.7      Alkaline Phos 25 - 125 95     93     134      AST 14 - 40 _2 ALT 10 - 40 U/L _3 This result is from an external source.     No results found for: "CEA1", "CEA" / No results found for: "CEA1", "CEA" No results found for: "PSA1" No results found for: "FIE332" No results found for: "CAN125"  No results found for: "TOTALPROTELP", "ALBUMINELP", "A1GS", "A2GS", "BETS", "BETA2SER", "GAMS", "MSPIKE", "SPEI" No results found for: "TIBC", "FERRITIN", "IRONPCTSAT" Lab Results  Component Value Date   LDH 160 02/07/2022    STUDIES:  No results found.  EXAM:02/16/2022 NUCLEAR MEDICINE PET WHOLE  BODY IMPRESSION: There are 2  small foci of asymmetric focally increased metabolic activity within the left foot which are nonspecific, although may correspond with the patients melanoma or previous surgery related to that. No definite nodal metastases identified in the left lower extremity. No definite evidence of metabolically active metastatic disease. The previously identified pulmonary nodules are suboptimally evaluated due to motion artifact and size, but do not show significan hypermetabolic activity. However based on distribution, these remain suspicious for metastatic disease and chest CT follow up is recommended Coronary and Aortic Atherosclerosis.       I,Gabriella Ballesteros,acting as a scribe for Derwood Kaplan, MD.,have documented all relevant documentation on the behalf of Derwood Kaplan, MD,as directed by  Derwood Kaplan, MD while in the presence of Derwood Kaplan, MD.

## 2022-05-03 ENCOUNTER — Ambulatory Visit: Payer: Self-pay | Admitting: Oncology

## 2022-05-03 ENCOUNTER — Other Ambulatory Visit: Payer: Medicare HMO

## 2022-05-04 ENCOUNTER — Other Ambulatory Visit: Payer: Self-pay | Admitting: Oncology

## 2022-05-04 ENCOUNTER — Inpatient Hospital Stay (INDEPENDENT_AMBULATORY_CARE_PROVIDER_SITE_OTHER): Payer: Medicare HMO | Admitting: Oncology

## 2022-05-04 ENCOUNTER — Inpatient Hospital Stay: Payer: Medicare HMO

## 2022-05-04 ENCOUNTER — Encounter: Payer: Self-pay | Admitting: Oncology

## 2022-05-04 VITALS — BP 157/67 | HR 85 | Temp 98.0°F | Resp 18 | Ht 69.69 in | Wt 233.4 lb

## 2022-05-04 DIAGNOSIS — C439 Malignant melanoma of skin, unspecified: Secondary | ICD-10-CM

## 2022-05-04 DIAGNOSIS — C792 Secondary malignant neoplasm of skin: Secondary | ICD-10-CM

## 2022-05-04 DIAGNOSIS — R300 Dysuria: Secondary | ICD-10-CM | POA: Diagnosis not present

## 2022-05-04 DIAGNOSIS — D72823 Leukemoid reaction: Secondary | ICD-10-CM

## 2022-05-04 DIAGNOSIS — K219 Gastro-esophageal reflux disease without esophagitis: Secondary | ICD-10-CM

## 2022-05-04 LAB — COMPREHENSIVE METABOLIC PANEL
Albumin: 4.1 (ref 3.5–5.0)
Calcium: 9.1 (ref 8.7–10.7)

## 2022-05-04 LAB — BASIC METABOLIC PANEL
BUN: 23 — AB (ref 4–21)
CO2: 26 — AB (ref 13–22)
Chloride: 103 (ref 99–108)
Creatinine: 0.8 (ref 0.6–1.3)
Glucose: 101
Potassium: 4.1 mEq/L (ref 3.5–5.1)
Sodium: 137 (ref 137–147)

## 2022-05-04 LAB — HEPATIC FUNCTION PANEL
ALT: 20 U/L (ref 10–40)
AST: 26 (ref 14–40)
Alkaline Phosphatase: 95 (ref 25–125)
Bilirubin, Total: 1

## 2022-05-04 LAB — CBC: RBC: 4.91 (ref 3.87–5.11)

## 2022-05-04 LAB — CBC AND DIFFERENTIAL
HCT: 40 — AB (ref 41–53)
Hemoglobin: 13.4 — AB (ref 13.5–17.5)
Neutrophils Absolute: 25.11
Platelets: 230 10*3/uL (ref 150–400)
WBC: 31

## 2022-05-04 MED ORDER — FAMOTIDINE 40 MG PO TABS: 40.0000 mg | ORAL_TABLET | Freq: Every day | ORAL | 5 refills | Status: DC

## 2022-05-05 ENCOUNTER — Ambulatory Visit: Payer: Medicare HMO

## 2022-05-05 MED FILL — Nivolumab IV Soln 100 MG/10ML: INTRAVENOUS | Qty: 30 | Status: AC

## 2022-05-05 MED FILL — Ipilimumab Soln for IV Infusion 200 MG/40ML (5 MG/ML): INTRAVENOUS | Qty: 20 | Status: AC

## 2022-05-08 ENCOUNTER — Inpatient Hospital Stay: Payer: Medicare HMO

## 2022-05-08 VITALS — BP 159/75 | HR 68 | Temp 98.1°F | Resp 16 | Wt 233.1 lb

## 2022-05-08 DIAGNOSIS — C792 Secondary malignant neoplasm of skin: Secondary | ICD-10-CM

## 2022-05-08 DIAGNOSIS — Z5112 Encounter for antineoplastic immunotherapy: Secondary | ICD-10-CM | POA: Diagnosis not present

## 2022-05-08 DIAGNOSIS — C4372 Malignant melanoma of left lower limb, including hip: Secondary | ICD-10-CM | POA: Diagnosis not present

## 2022-05-08 DIAGNOSIS — C439 Malignant melanoma of skin, unspecified: Secondary | ICD-10-CM

## 2022-05-08 DIAGNOSIS — Z79899 Other long term (current) drug therapy: Secondary | ICD-10-CM | POA: Diagnosis not present

## 2022-05-08 LAB — URINALYSIS, COMPLETE (UACMP) WITH MICROSCOPIC
Bilirubin: NEGATIVE
Ketones: NEGATIVE
Leukocytes,UA: NEGATIVE
Nitrite, UA: NEGATIVE
Occult Blood: NEGATIVE
Protein: NEGATIVE
RBC, Urine: 1
Specific Gravity: 1.027
Squamous Epithelial: 2
WBC Urine, dipstick: 1
pH, Urine: 5.5

## 2022-05-08 LAB — CHG CULTURE BACTERIAL QUANTTATIVE COLONY COUNT URINE: Urine Culture Result: NO GROWTH

## 2022-05-08 MED ORDER — HEPARIN SOD (PORK) LOCK FLUSH 100 UNIT/ML IV SOLN
500.0000 [IU] | Freq: Once | INTRAVENOUS | Status: AC | PRN
  Administered 2022-05-08: 500 [IU]

## 2022-05-08 MED ORDER — SODIUM CHLORIDE 0.9 % IV SOLN
2.8900 mg/kg | Freq: Once | INTRAVENOUS | Status: AC
  Administered 2022-05-08: 300 mg via INTRAVENOUS
  Filled 2022-05-08: qty 30

## 2022-05-08 MED ORDER — SODIUM CHLORIDE 0.9 % IV SOLN
1.0000 mg/kg | Freq: Once | INTRAVENOUS | Status: AC
  Administered 2022-05-08: 100 mg via INTRAVENOUS
  Filled 2022-05-08: qty 20

## 2022-05-08 MED ORDER — DIPHENHYDRAMINE HCL 50 MG/ML IJ SOLN
25.0000 mg | Freq: Once | INTRAMUSCULAR | Status: AC
  Administered 2022-05-08: 25 mg via INTRAVENOUS
  Filled 2022-05-08: qty 1

## 2022-05-08 MED ORDER — SODIUM CHLORIDE 0.9% FLUSH
10.0000 mL | INTRAVENOUS | Status: DC | PRN
  Administered 2022-05-08: 10 mL

## 2022-05-08 MED ORDER — SODIUM CHLORIDE 0.9 % IV SOLN: Freq: Once | INTRAVENOUS | Status: AC

## 2022-05-08 MED ORDER — FAMOTIDINE IN NACL 20-0.9 MG/50ML-% IV SOLN
20.0000 mg | Freq: Once | INTRAVENOUS | Status: AC
  Administered 2022-05-08: 20 mg via INTRAVENOUS
  Filled 2022-05-08: qty 50

## 2022-05-08 NOTE — Patient Instructions (Signed)
Ipilimumab Injection What is this medication? IPILIMUMAB (IP i LIM ue mab) treats some types of cancer. It works by helping your immune system slow or stop the spread of cancer cells. It is a monoclonal antibody. This medicine may be used for other purposes; ask your health care provider or pharmacist if you have questions. COMMON BRAND NAME(S): YERVOY What should I tell my care team before I take this medication? They need to know if you have any of these conditions: Allogeneic stem cell transplant (uses someone else's stem cells) Autoimmune diseases, such as Crohn disease, ulcerative colitis, lupus Nervous system problems, such as Guillain-Barre syndrome or myasthenia gravis Organ transplant An unusual or allergic reaction to ipilimumab, other medications, foods, dyes, or preservatives Pregnant or trying to get pregnant Breast-feeding How should I use this medication? This medication is infused into a vein. It is given by your care team in a hospital or clinic setting. A special MedGuide will be given to you before each treatment. Be sure to read this information carefully each time. Talk to your care team about the use of this medication in children. While it may be prescribed for children as young as 12 years for selected conditions, precautions do apply. Overdosage: If you think you have taken too much of this medicine contact a poison control center or emergency room at once. NOTE: This medicine is only for you. Do not share this medicine with others. What if I miss a dose? Keep appointments for follow-up doses. It is important not to miss your dose. Call your care team if you are unable to keep an appointment. What may interact with this medication? Interactions are not expected. This list may not describe all possible interactions. Give your health care provider a list of all the medicines, herbs, non-prescription drugs, or dietary supplements you use. Also tell them if you smoke,  drink alcohol, or use illegal drugs. Some items may interact with your medicine. What should I watch for while using this medication? Your condition will be monitored carefully while you are receiving this medication. You may need blood work while taking this medication. This medication may cause serious skin reactions. They can happen weeks to months after starting the medication. Contact your care team right away if you notice fevers or flu-like symptoms with a rash. The rash may be red or purple and then turn into blisters or peeling of the skin. You may also notice a red rash with swelling of the face, lips, or lymph nodes in your neck or under your arms. Tell your care team right away if you have any change in your eyesight. Talk to your care team if you may be pregnant. Serious birth defects can occur if you take this medication during pregnancy and for 3 months after the last dose. You will need a negative pregnancy test before starting this medication. Contraception is recommended while taking this medication and for 3 months after the last dose. Your care team can help you find the option that works for you. Do not breastfeed while taking this medication and for 3 months after the last dose. What side effects may I notice from receiving this medication? Side effects that you should report to your care team as soon as possible: Allergic reactions--skin rash, itching, hives, swelling of the face, lips, tongue, or throat Dry cough, shortness of breath or trouble breathing Eye pain, redness, irritation, or discharge with blurry or decreased vision Heart muscle inflammation--unusual weakness or fatigue, shortness of breath,   chest pain, fast or irregular heartbeat, dizziness, swelling of the ankles, feet, or hands Hormone gland problems--headache, sensitivity to light, unusual weakness or fatigue, dizziness, fast or irregular heartbeat, increased sensitivity to cold or heat, excessive sweating,  constipation, hair loss, increased thirst or amount of urine, tremors or shaking, irritability Infusion reactions--chest pain, shortness of breath or trouble breathing, feeling faint or lightheaded Kidney injury (glomerulonephritis)--decrease in the amount of urine, red or dark brown urine, foamy or bubbly urine, swelling of the ankles, hands, or feet Liver injury--right upper belly pain, loss of appetite, nausea, light-colored stool, dark yellow or brown urine, yellowing skin or eyes, unusual weakness or fatigue Pain, tingling, or numbness in the hands or feet, muscle weakness, change in vision, confusion or trouble speaking, loss of balance or coordination, trouble walking, seizures Rash, fever, and swollen lymph nodes Redness, blistering, peeling, or loosening of the skin, including inside the mouth Sudden or severe stomach pain, bloody diarrhea, fever, nausea, vomiting Side effects that usually do not require medical attention (report to your care team if they continue or are bothersome): Bone, joint, or muscle pain Diarrhea Fatigue Loss of appetite Nausea Skin rash This list may not describe all possible side effects. Call your doctor for medical advice about side effects. You may report side effects to FDA at 1-800-FDA-1088. Where should I keep my medication? This medication is given in a hospital or clinic. It will not be stored at home. NOTE: This sheet is a summary. It may not cover all possible information. If you have questions about this medicine, talk to your doctor, pharmacist, or health care provider.  2023 [employer] Standard (2021-12-20 00:00:00) Nivolumab Injection What is this medication? NIVOLUMAB (nye VOL ue mab) treats some types of cancer. It works by helping your immune system slow or stop the spread of cancer cells. It is a monoclonal antibody. This medicine may be used for other purposes; ask your health care provider or pharmacist if you have questions. COMMON  BRAND NAME(S): Opdivo What should I tell my care team before I take this medication? They need to know if you have any of these conditions: Allogeneic stem cell transplant (uses someone else's stem cells) Autoimmune diseases, such as Crohn disease, ulcerative colitis, lupus History of chest radiation Nervous system problems, such as Guillain-Barre syndrome or myasthenia gravis Organ transplant An unusual or allergic reaction to nivolumab, other medications, foods, dyes, or preservatives Pregnant or trying to get pregnant Breast-feeding How should I use this medication? This medication is infused into a vein. It is given in a hospital or clinic setting. A special MedGuide will be given to you before each treatment. Be sure to read this information carefully each time. Talk to your care team about the use of this medication in children. While it may be prescribed for children as young as 12 years for selected conditions, precautions do apply. Overdosage: If you think you have taken too much of this medicine contact a poison control center or emergency room at once. NOTE: This medicine is only for you. Do not share this medicine with others. What if I miss a dose? Keep appointments for follow-up doses. It is important not to miss your dose. Call your care team if you are unable to keep an appointment. What may interact with this medication? Interactions have not been studied. This list may not describe all possible interactions. Give your health care provider a list of all the medicines, herbs, non-prescription drugs, or dietary supplements you use. Also tell   them if you smoke, drink alcohol, or use illegal drugs. Some items may interact with your medicine. What should I watch for while using this medication? Your condition will be monitored carefully while you are receiving this medication. You may need blood work while taking this medication. This medication may cause serious skin reactions.  They can happen weeks to months after starting the medication. Contact your care team right away if you notice fevers or flu-like symptoms with a rash. The rash may be red or purple and then turn into blisters or peeling of the skin. You may also notice a red rash with swelling of the face, lips, or lymph nodes in your neck or under your arms. Tell your care team right away if you have any change in your eyesight. Talk to your care team if you are pregnant or think you might be pregnant. A negative pregnancy test is required before starting this medication. A reliable form of contraception is recommended while taking this medication and for 5 months after the last dose. Talk to your care team about effective forms of contraception. Do not breast-feed while taking this medication and for 5 months after the last dose. What side effects may I notice from receiving this medication? Side effects that you should report to your care team as soon as possible: Allergic reactions--skin rash, itching, hives, swelling of the face, lips, tongue, or throat Dry cough, shortness of breath or trouble breathing Eye pain, redness, irritation, or discharge with blurry or decreased vision Heart muscle inflammation--unusual weakness or fatigue, shortness of breath, chest pain, fast or irregular heartbeat, dizziness, swelling of the ankles, feet, or hands Hormone gland problems--headache, sensitivity to light, unusual weakness or fatigue, dizziness, fast or irregular heartbeat, increased sensitivity to cold or heat, excessive sweating, constipation, hair loss, increased thirst or amount of urine, tremors or shaking, irritability Infusion reactions--chest pain, shortness of breath or trouble breathing, feeling faint or lightheaded Kidney injury (glomerulonephritis)--decrease in the amount of urine, red or dark brown urine, foamy or bubbly urine, swelling of the ankles, hands, or feet Liver injury--right upper belly pain, loss  of appetite, nausea, light-colored stool, dark yellow or brown urine, yellowing skin or eyes, unusual weakness or fatigue Pain, tingling, or numbness in the hands or feet, muscle weakness, change in vision, confusion or trouble speaking, loss of balance or coordination, trouble walking, seizures Rash, fever, and swollen lymph nodes Redness, blistering, peeling, or loosening of the skin, including inside the mouth Sudden or severe stomach pain, bloody diarrhea, fever, nausea, vomiting Side effects that usually do not require medical attention (report these to your care team if they continue or are bothersome): Bone, joint, or muscle pain Diarrhea Fatigue Loss of appetite Nausea Skin rash This list may not describe all possible side effects. Call your doctor for medical advice about side effects. You may report side effects to FDA at 1-800-FDA-1088. Where should I keep my medication? This medication is given in a hospital or clinic. It will not be stored at home. NOTE: This sheet is a summary. It may not cover all possible information. If you have questions about this medicine, talk to your doctor, pharmacist, or health care provider.  2023 [employer] Standard (2021-07-08 00:00:00)  

## 2022-05-08 NOTE — Progress Notes (Signed)
Ipilimumab (YERVOY) Patient Monitoring Assessment   Is the patient experiencing any of the following general symptoms?:  '[]'$ Patient is not experiencing any of the general symptoms listed in this section.  '[]'$ Difficulty performing normal activities '[]'$ Feeling sluggish or cold all the time '[]'$ Unusual weight gain '[]'$ Constant or unusual headaches '[]'$ Feeling dizzy or faint '[]'$ Changes in eyesight (blurry vision, double vision, or other vision problems) '[]'$ Changes in mood or behavior (ex: decreased sex drive, irritability, or forgetfulness) '[]'$ Starting new medications (ex: steroids, other medications that lower immune response)   Gastrointestinal  Patient is having 1  bowel movements each day.  Is this different from baseline? '[]'$ Yes '[x]'$ No Are your stools watery or do they have a foul smell? '[]'$ Yes '[x]'$ No Have you seen blood in your stools? '[]'$ Yes '[x]'$ No Are your stools dark, tarry, or sticky? '[]'$ Yes '[x]'$ No Are you having pain or tenderness in your belly? '[]'$ Yes '[x]'$ No  Skin Does your skin itch? '[x]'$ Yes '[]'$ No Do you have a rash? '[]'$ Yes '[x]'$ No Has your skin blistered and/or peeled? '[]'$ Yes '[x]'$ No Do you have sores in your mouth? '[]'$ Yes '[x]'$ No  Hepatic Has your urine been dark or tea colored? '[]'$ Yes '[x]'$ No Have you noticed your skin or the whites of your eyes are turning yellow? '[]'$ Yes '[x]'$ No Are you bleeding or bruising more easily than normal? '[]'$ Yes '[x]'$ No Are you nauseous and/or vomiting? '[]'$ Yes '[x]'$ No Do you have pain on the right side of your stomach? '[]'$ Yes '[x]'$ No  Neurologic  Are you having unusual weakness of legs, arms, or face? '[]'$ Yes '[x]'$ No Are you having numbness or tingling in your hands or feet? '[]'$ Yes '[x]'$ No  Maxwell Marion

## 2022-05-15 DIAGNOSIS — T8189XA Other complications of procedures, not elsewhere classified, initial encounter: Secondary | ICD-10-CM | POA: Diagnosis not present

## 2022-05-15 DIAGNOSIS — C4372 Malignant melanoma of left lower limb, including hip: Secondary | ICD-10-CM | POA: Diagnosis not present

## 2022-05-15 DIAGNOSIS — Y839 Surgical procedure, unspecified as the cause of abnormal reaction of the patient, or of later complication, without mention of misadventure at the time of the procedure: Secondary | ICD-10-CM | POA: Diagnosis not present

## 2022-05-16 DIAGNOSIS — I10 Essential (primary) hypertension: Secondary | ICD-10-CM | POA: Diagnosis not present

## 2022-05-16 DIAGNOSIS — Z483 Aftercare following surgery for neoplasm: Secondary | ICD-10-CM | POA: Diagnosis not present

## 2022-05-16 DIAGNOSIS — Z87891 Personal history of nicotine dependence: Secondary | ICD-10-CM | POA: Diagnosis not present

## 2022-05-16 DIAGNOSIS — Z7982 Long term (current) use of aspirin: Secondary | ICD-10-CM | POA: Diagnosis not present

## 2022-05-16 DIAGNOSIS — K219 Gastro-esophageal reflux disease without esophagitis: Secondary | ICD-10-CM | POA: Diagnosis not present

## 2022-05-16 DIAGNOSIS — Z8673 Personal history of transient ischemic attack (TIA), and cerebral infarction without residual deficits: Secondary | ICD-10-CM | POA: Diagnosis not present

## 2022-05-16 DIAGNOSIS — C4372 Malignant melanoma of left lower limb, including hip: Secondary | ICD-10-CM | POA: Diagnosis not present

## 2022-05-16 DIAGNOSIS — Z7902 Long term (current) use of antithrombotics/antiplatelets: Secondary | ICD-10-CM | POA: Diagnosis not present

## 2022-05-21 ENCOUNTER — Encounter: Payer: Self-pay | Admitting: Oncology

## 2022-05-22 DIAGNOSIS — T8189XA Other complications of procedures, not elsewhere classified, initial encounter: Secondary | ICD-10-CM | POA: Diagnosis not present

## 2022-05-22 DIAGNOSIS — C439 Malignant melanoma of skin, unspecified: Secondary | ICD-10-CM | POA: Diagnosis not present

## 2022-05-24 DIAGNOSIS — H52209 Unspecified astigmatism, unspecified eye: Secondary | ICD-10-CM | POA: Diagnosis not present

## 2022-05-24 DIAGNOSIS — Z23 Encounter for immunization: Secondary | ICD-10-CM | POA: Diagnosis not present

## 2022-05-24 DIAGNOSIS — H524 Presbyopia: Secondary | ICD-10-CM | POA: Diagnosis not present

## 2022-05-24 DIAGNOSIS — H5203 Hypermetropia, bilateral: Secondary | ICD-10-CM | POA: Diagnosis not present

## 2022-05-26 ENCOUNTER — Encounter: Payer: Self-pay | Admitting: Oncology

## 2022-05-26 ENCOUNTER — Inpatient Hospital Stay: Payer: Medicare HMO | Attending: Oncology | Admitting: Oncology

## 2022-05-26 ENCOUNTER — Inpatient Hospital Stay: Payer: Medicare HMO

## 2022-05-26 VITALS — BP 147/74 | HR 84 | Temp 97.7°F | Resp 16 | Ht 69.69 in | Wt 229.0 lb

## 2022-05-26 DIAGNOSIS — C439 Malignant melanoma of skin, unspecified: Secondary | ICD-10-CM

## 2022-05-26 DIAGNOSIS — C4372 Malignant melanoma of left lower limb, including hip: Secondary | ICD-10-CM | POA: Diagnosis not present

## 2022-05-26 DIAGNOSIS — Z79899 Other long term (current) drug therapy: Secondary | ICD-10-CM | POA: Diagnosis not present

## 2022-05-26 DIAGNOSIS — C792 Secondary malignant neoplasm of skin: Secondary | ICD-10-CM

## 2022-05-26 DIAGNOSIS — R918 Other nonspecific abnormal finding of lung field: Secondary | ICD-10-CM

## 2022-05-26 DIAGNOSIS — Z5112 Encounter for antineoplastic immunotherapy: Secondary | ICD-10-CM | POA: Diagnosis not present

## 2022-05-26 DIAGNOSIS — D649 Anemia, unspecified: Secondary | ICD-10-CM | POA: Diagnosis not present

## 2022-05-26 LAB — CBC AND DIFFERENTIAL
HCT: 41 (ref 41–53)
Hemoglobin: 13.8 (ref 13.5–17.5)
Neutrophils Absolute: 6.66
Platelets: 351 10*3/uL (ref 150–400)
WBC: 12.1

## 2022-05-26 LAB — CBC: RBC: 5.04 (ref 3.87–5.11)

## 2022-05-26 LAB — BASIC METABOLIC PANEL
BUN: 19 (ref 4–21)
CO2: 27 — AB (ref 13–22)
Chloride: 105 (ref 99–108)
Creatinine: 0.9 (ref 0.6–1.3)
Glucose: 105
Potassium: 3.9 mEq/L (ref 3.5–5.1)
Sodium: 142 (ref 137–147)

## 2022-05-26 LAB — HEPATIC FUNCTION PANEL
ALT: 20 U/L (ref 10–40)
AST: 37 (ref 14–40)
Alkaline Phosphatase: 110 (ref 25–125)
Bilirubin, Total: 0.6

## 2022-05-26 LAB — TSH: TSH: 1.06 u[IU]/mL (ref 0.350–4.500)

## 2022-05-26 LAB — COMPREHENSIVE METABOLIC PANEL
Albumin: 4.3 (ref 3.5–5.0)
Calcium: 9.4 (ref 8.7–10.7)
eGFR: >60

## 2022-05-26 MED FILL — Ipilimumab Soln for IV Infusion 50 MG/10ML (5 MG/ML): INTRAVENOUS | Qty: 20 | Status: AC

## 2022-05-26 MED FILL — Nivolumab IV Soln 100 MG/10ML: INTRAVENOUS | Qty: 30 | Status: AC

## 2022-05-26 NOTE — Progress Notes (Signed)
[name]  [address] [place],  [place]  [address] [phone]  Clinic Day: 05/26/22  Referring physician: Derwood Kaplan, MD   ASSESSMENT & PLAN:   Malignant melanoma of the left foot He has had a punch biopsy and this reveals a Breslow thickness of 1.6 mm and Clark's level 4 with ulceration for a T2b N0 M0 clinically.  We now find no distant metastasis on PET scan.  His tumor is positive for BRAF mutation.  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  Multiple lesions of the lung These are suspicious for metastatic disease.  The PET scan is negative but we cannot absolutely rule out metastatic disease since these lesions are small.  Spinal stenosis at L4-5 He has had surgical decompression and resection of a synovial cyst as of June 8.  Gouty arthritis He had an acute flare last month involving primarily his elbow.  Leukocytosis He has had a leukocytosis for months but his white count increased to 31,000 with 81% neutrophils last month. He had no symptoms of infection and urinalysis and urine culture were negative.  We did not find an explanation but the [facility] are back down to 12.1.   He is tolerating the immunotherapy well.  He will proceed with his 3rd cycle on Monday. His tumor is positive for BRAF mutation. PDL1 total score was 90%.  His last surgery was 03/24/2022, and he still has a wound that is being followed by the surgeon and the wound center. He has persistent edema of the left leg. I will plan to see him back in 3 weeks with CBC, CMP, T4 and TSH. We will later plan to re-scan his lung. I discussed the assessment and treatment plan with the patient.  The patient and his family were provided an opportunity to ask questions and all were answered.  The patient agreed with the plan and demonstrated an understanding of the  instructions.  The patient was advised to call back if the symptoms worsen or if the condition fails to improve as anticipated.   I provided 20 minutes of face-to-face time during this this encounter and > 50% was spent counseling as documented under my assessment and plan.    Benjamin Kaplan, MD [place] [address] [place] [address] Dept: [phone] Dept Fax: [phone]   CHIEF COMPLAINT:  CC: Malignant melanoma  Current Treatment: Evaluation and immunotherapy   HISTORY OF PRESENT ILLNESS:  [name] is a 76 y.o. male with a history of malignant melanoma who is referred in consultation with Dr. Bertram Anthony for assessment and management.  He had a lesion of the dorsum of the left foot in September 2022 which appeared to be a granuloma and was treated in the office.  He later developed a another lesion lateral to this on the dorsum of the left foot which was not hyperpigmented but did bleed easily.  He also has several lesions of his left medial calf which are nodular and span approximately 6 cm.  He has had some increased edema of the left lower leg.  He was evaluated by dermatology on June 5, who felt this could be a squamous cell carcinoma versus tinea or a drug reaction.  He had biopsies done of both the foot lesion and the Lesion.  Pathology came back  that the left dorsal foot shave biopsy revealed a malignant melanoma possibly a primary nodular ulcerated melanoma which had a Breslow thickness of at least 1.6 mm and a Clark's level 4.  Margins are positive as this was a shave biopsy.  Ulceration is present but no satellitosis.  The mitotic index is 4/mm and no lymphovascular invasion was identified.  No brisk tumor infiltrating lymphocytes are seen and tumor regression is absent.  This is felt to be at least a T2b lesion and wide excision was recommended.  The lesion of the left medial calf revealed a dermal  map melanoma most consistent with metastatic disease.  This information was not available when the patient was admitted for back surgery on June 8 and he had a microlumbar decompression at L4-5 for spinal stenosis.  He was also found to have a synovial cyst at that time which was resected and benign.  He was readmitted several days later for acute pain of the right elbow and both arms.  Aspiration was obtained and this was diagnosed as gouty arthritis.  He did have temporary rehab after his back surgery.  His left leg was evaluated for deep venous thrombosis and was negative.  While he was recuperating from his surgery he had an x-ray which was abnormal which led to a CT scan which revealed at least 3 lung lesions.  MRI of the brain was negative and he is now referred for management of the malignant melanoma.  INTERVAL HISTORY:  I have reviewed his chart and materials related to his cancer extensively and collaborated history with the patient. Summary of oncologic history is as follows: Oncology History  Malignant melanoma, metastatic ([place])  01/23/2022 Cancer Staging   Staging form: Melanoma of the Skin, AJCC 8th Edition - Clinical stage from 01/23/2022: Stage III (cT2b, cN1c, cM0) - Signed by Benjamin Kaplan, MD on 04/07/2022 Histopathologic type: Malignant melanoma, NOS (except juvenile melanoma M-8770/[id number]) Stage prefix: Initial diagnosis Laterality: Left Lymph-vascular invasion (LVI): LVI not present (absent)/not identified Diagnostic confirmation: Positive histology Specimen type: Biopsy / Limited Resection Staged by: Managing physician Mitotic count: 4 Mitotic unit: mm2 Clark's level: Level IV Tumor-infiltrating lymphocytes: Present and non-brisk Breslow depth (mm): 16 Ulceration of the epidermis: Yes Microsatellites: No Primary tumor regression: Absent Intransit/satellite metastasis: In-transit Lymph node clinically or radiologically detected: No Microscopic confirmation of  metastasis: No Sentinel lymph node biopsy performed: No Completion or therapeutic lymph node dissection performed: No Matted nodes: No Stage used in treatment planning: Yes National guidelines used in treatment planning: Yes Type of national guideline used in treatment planning: NCCN   01/31/2022 Initial Diagnosis   Malignant melanoma, metastatic ([place])   04/14/2022 -  Chemotherapy   Patient is on Treatment Plan : MELANOMA Nivolumab (3) + Ipilimumab (1) q21d / Nivolumab (240) q14d     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[place])  02/28/2022 Cancer Staging   Staging form: Melanoma of the Skin, AJCC 8th Edition - Clinical stage from 02/28/2022: Stage IV (cT2b(m), cN1c, cM1a) - Signed by Benjamin Kaplan, MD on 04/07/2022 Histopathologic type: Malignant melanoma, NOS (except juvenile melanoma M-8770/[id number]) Stage prefix: Initial diagnosis Laterality: Left Multiple tumors: Yes Lymph-vascular invasion (LVI): LVI present/identified, NOS Diagnostic confirmation: Positive histology Specimen type: Excision Staged by: Managing physician Intransit/satellite metastasis: Both in-transit and satellite Prognostic indicators: 02/28/22:  Wide excision of primary of foot - no residual melanoma,  Excision of skin of calf consistent with intransit mets of dermal/subcuticular   Melanoma with LVI, metastatic disease with multifocal  lesions and multifocal positive margins 03/24/22: Re-excision with a few atypical melanocytes, no melanoma and clear margins   04/07/2022 Initial Diagnosis   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 He continues to have edema of the left leg.  The wound is coming along. He reports his back is feeling better, it does have occasional  pain. His appetite is good. His labs today reveal a dramatic decrease in his leukocytosis from 31,000 to 12,000.  We found no signs or symptoms of infection. The rest of his labs are unremarkable. He denies hemoptysis, cough, chest pain, dyspnea, or wheezing. He denies fever, chills, night sweats, or other signs of infection. He denies cardiorespiratory and gastrointestinal issues.  He denies any dysuria or hematuria.   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 with in-transit metastases  Past Surgical History:  Procedure Laterality Date   LUMBAR LAMINECTOMY/DECOMPRESSION MICRODISCECTOMY N/A 01/26/2022   Procedure: Microlumbar decompression Lumbar four-five, lateral mass fusion with autograft and allograft bone;  Surgeon: Susa Day, MD;  Location: [facility];  Service: Orthopedics;  Laterality: N/A;   PARTIAL GASTRECTOMY     TOTAL HIP ARTHROPLASTY Right 06/18/2018   Procedure: RIGHT TOTAL HIP ARTHROPLASTY ANTERIOR APPROACH;  Surgeon: [name], MD;  Location: [facility];  Service: Orthopedics;  Laterality: Right;    Family History  Problem Relation Age of Onset   Melanoma Brother     Social History:  reports that he has quit smoking. He has never used smokeless tobacco. He reports that he does not currently use alcohol. He reports that he does not use drugs.The patient is married and accompanied by his wife today.  His daughter-in-law Freida Busman is also here today.  They drink about 1 glass of wine daily.  They have a son and daughter.  He is retired from working with [employer] and did have chemical exposures with that.  Allergies:  Allergies   Allergen Reactions   Morphine Itching    Patient states it is tolerable itching    Current Medications: Current Outpatient Medications  Medication Sig Dispense Refill   amLODipine (NORVASC) 5 MG tablet Take 5 mg by mouth daily.     clopidogrel (PLAVIX) 75 MG tablet Take 1 tablet by mouth daily.     docusate sodium (COLACE) 100 MG capsule 1 capsule 2 TIMES DAILY (route: oral)     famotidine (PEPCID) 40 MG tablet Take 1 tablet (40 mg total) by mouth daily. 30 tablet 5   Flaxseed, Linseed, (FLAXSEED OIL) 1000 MG CAPS Take 1,000 mg by mouth daily.     folic acid (FOLVITE) 101 MCG tablet Take 400 mcg by mouth daily.     gabapentin (NEURONTIN) 300 MG capsule Take 1 capsule (300 mg total) by mouth 3 (three) times daily. 90 capsule 2  hydrochlorothiazide (HYDRODIURIL) 12.5 MG tablet Take 12.5 mg by mouth daily.     lisinopril (ZESTRIL) 20 MG tablet      Multiple Vitamin (MULTIVITAMIN WITH MINERALS) TABS tablet Take 1 tablet by mouth in the morning.     mupirocin ointment (BACTROBAN) 2 % Apply topically.     Omega-3 1000 MG CAPS Take by mouth.     ondansetron (ZOFRAN) 4 MG tablet Take 1 tablet (4 mg total) by mouth every 4 (four) hours as needed for nausea. 90 tablet 3   oxyCODONE (OXY IR/ROXICODONE) 5 MG immediate release tablet Take 1 tablet (5 mg total) by mouth every 6 (six) hours as needed for severe pain. 30 tablet 0   polyethylene glycol (MIRALAX / GLYCOLAX) 17 g packet Take 17 g by mouth daily. 14 each 0   polyethylene glycol powder (GLYCOLAX/MIRALAX) 17 GM/SCOOP powder SMARTSIG:17 Gram(s) By Mouth Daily PRN     pravastatin (PRAVACHOL) 40 MG tablet Take 40 mg by mouth daily.     prochlorperazine (COMPAZINE) 10 MG tablet Take 1 tablet (10 mg total) by mouth every 6 (six) hours as needed for nausea or vomiting. 90 tablet 3   traZODone (DESYREL) 50 MG tablet Take 50 mg by mouth at bedtime as needed for sleep.     No current facility-administered medications for this visit.    REVIEW OF  SYSTEMS:  Review of Systems  HENT:  Negative.    Eyes: Negative.   Cardiovascular:  Negative for leg swelling.  Gastrointestinal: Negative.   Endocrine: Negative.   Genitourinary: Negative.    Musculoskeletal:  Positive for back pain.  Skin:  Positive for wound.       He has a lesion on the dorsum of the left foot as well as some nodules of the medial left calf.  Neurological: Negative.   Hematological: Negative.   Psychiatric/Behavioral: Negative.        VITALS:  Blood pressure (!) 147/74, pulse 84, temperature 97.7 F (36.5 C), temperature source Oral, resp. rate 16, height 5' 9.69" (1.77 m), weight 229 lb (103.9 kg), SpO2 98 %.  Wt Readings from Last 3 Encounters:  06/16/22 225 lb 11.2 oz (102.4 kg)  05/29/22 229 lb (103.9 kg)  05/26/22 229 lb (103.9 kg)    Body mass index is 33.15 kg/m.  Performance status (ECOG): 1 - Symptomatic but completely ambulatory  PHYSICAL EXAM:  Physical Exam Constitutional:      Appearance: Normal appearance.  HENT:     Head: Normocephalic and atraumatic.     Nose: Nose normal.     Mouth/Throat:     Pharynx: Oropharynx is clear.  Eyes:     Extraocular Movements: Extraocular movements intact.     Conjunctiva/sclera: Conjunctivae normal.     Pupils: Pupils are equal, round, and reactive to light.  Cardiovascular:     Rate and Rhythm: Normal rate and regular rhythm.     Pulses: Normal pulses.     Heart sounds: Normal heart sounds. No murmur heard.    No gallop.  Pulmonary:     Breath sounds: Normal breath sounds.  Abdominal:     General: Bowel sounds are normal.     Palpations: Abdomen is soft.     Tenderness: There is no abdominal tenderness.  Musculoskeletal:     Cervical back: Normal range of motion.     Left lower leg: Edema present.     Comments: 1+ edema  Skin:    General: Skin is warm and dry.  Findings: Lesion present.     Comments: He has a scaly excoriated lesion of the lateral dorsum of the left foot.  This  appears ulcerated.  He has a cluster of several nodules of the medial left calf which are firm and purpleish in color and spanning a length of 6 cm  Neurological:     General: No focal deficit present.     Mental Status: He is alert and oriented to person, place, and time.  Psychiatric:        Mood and Affect: Mood normal.        Behavior: Behavior normal.        Thought Content: Thought content normal.        Judgment: Judgment normal.     LABS:      Latest Ref Rng & Units 06/16/2022   12:00 AM 05/26/2022   12:00 AM 05/04/2022   12:00 AM  CBC  WBC  13.9     12.1     31.0      Hemoglobin 13.5 - 17.5 14.0     13.8     13.4      Hematocrit 41 - 53 42     41     40      Platelets 150 - 400 K/uL 250     351     230         This result is from an external source.      Latest Ref Rng & Units 06/16/2022   12:00 AM 05/26/2022   12:00 AM 05/04/2022   12:00 AM  CMP  BUN 4 - _0 Creatinine 0.6 - 1.3 0.9     0.9     0.8      Sodium 137 - 147 139     142     137      Potassium 3.5 - 5.1 mEq/L 3.7     3.9     4.1      Chloride 99 - 108 105     105     103      CO2 13 - _1 Calcium 8.7 - 10.7 9.2     9.4     9.1      Alkaline Phos 25 - 125 107     110     95      AST 14 - 40 29     37     26      ALT 10 - 40 U/L _2 This result is from an external source.     No results found for: "CEA1", "CEA" / No results found for: "CEA1", "CEA" No results found for: "PSA1" No results found for: "FEX614" No results found for: "CAN125"  No results found for: "TOTALPROTELP", "ALBUMINELP", "A1GS", "A2GS", "BETS", "BETA2SER", "GAMS", "MSPIKE", "SPEI" No results found for: "TIBC", "FERRITIN", "IRONPCTSAT" Lab Results  Component Value Date   LDH 160 02/07/2022    STUDIES:  No results found.  EXAM:02/16/2022 NUCLEAR MEDICINE PET WHOLE BODY IMPRESSION: There are 2 small foci of asymmetric focally increased metabolic activity within  the left foot which are nonspecific, although may correspond with the patients melanoma or previous surgery related  to that. No definite nodal metastases identified in the left lower extremity. No definite evidence of metabolically active metastatic disease. The previously identified pulmonary nodules are suboptimally evaluated due to motion artifact and size, but do not show significan hypermetabolic activity. However based on distribution, these remain suspicious for metastatic disease and chest CT follow up is recommended Coronary and Aortic Atherosclerosis.       I,Gabriella Ballesteros,acting as a scribe for Benjamin Kaplan, MD.,have documented all relevant documentation on the behalf of Benjamin Kaplan, MD,as directed by  Benjamin Kaplan, MD while in the presence of Benjamin Kaplan, MD.

## 2022-05-28 LAB — T4: T4, Total: 7.5 ug/dL (ref 4.5–12.0)

## 2022-05-29 ENCOUNTER — Inpatient Hospital Stay: Payer: Medicare HMO

## 2022-05-29 VITALS — BP 148/75 | HR 70 | Temp 98.0°F | Resp 16 | Wt 229.0 lb

## 2022-05-29 DIAGNOSIS — C792 Secondary malignant neoplasm of skin: Secondary | ICD-10-CM

## 2022-05-29 DIAGNOSIS — C439 Malignant melanoma of skin, unspecified: Secondary | ICD-10-CM

## 2022-05-29 DIAGNOSIS — C4372 Malignant melanoma of left lower limb, including hip: Secondary | ICD-10-CM | POA: Diagnosis not present

## 2022-05-29 DIAGNOSIS — Z5112 Encounter for antineoplastic immunotherapy: Secondary | ICD-10-CM | POA: Diagnosis not present

## 2022-05-29 DIAGNOSIS — Z79899 Other long term (current) drug therapy: Secondary | ICD-10-CM | POA: Diagnosis not present

## 2022-05-29 MED ORDER — HEPARIN SOD (PORK) LOCK FLUSH 100 UNIT/ML IV SOLN
500.0000 [IU] | Freq: Once | INTRAVENOUS | Status: AC | PRN
  Administered 2022-05-29: 500 [IU]

## 2022-05-29 MED ORDER — DIPHENHYDRAMINE HCL 50 MG/ML IJ SOLN
25.0000 mg | Freq: Once | INTRAMUSCULAR | Status: AC
  Administered 2022-05-29: 25 mg via INTRAVENOUS
  Filled 2022-05-29: qty 1

## 2022-05-29 MED ORDER — SODIUM CHLORIDE 0.9 % IV SOLN
1.0000 mg/kg | Freq: Once | INTRAVENOUS | Status: AC
  Administered 2022-05-29: 100 mg via INTRAVENOUS
  Filled 2022-05-29: qty 20

## 2022-05-29 MED ORDER — SODIUM CHLORIDE 0.9 % IV SOLN
2.8900 mg/kg | Freq: Once | INTRAVENOUS | Status: AC
  Administered 2022-05-29: 300 mg via INTRAVENOUS
  Filled 2022-05-29: qty 30

## 2022-05-29 MED ORDER — SODIUM CHLORIDE 0.9% FLUSH
10.0000 mL | INTRAVENOUS | Status: DC | PRN
  Administered 2022-05-29: 10 mL

## 2022-05-29 MED ORDER — SODIUM CHLORIDE 0.9 % IV SOLN: Freq: Once | INTRAVENOUS | Status: AC

## 2022-05-29 MED ORDER — FAMOTIDINE IN NACL 20-0.9 MG/50ML-% IV SOLN
20.0000 mg | Freq: Once | INTRAVENOUS | Status: AC
  Administered 2022-05-29: 20 mg via INTRAVENOUS
  Filled 2022-05-29: qty 50

## 2022-05-29 NOTE — Patient Instructions (Signed)
Ipilimumab Injection What is this medication? IPILIMUMAB (IP i LIM ue mab) treats some types of cancer. It works by helping your immune system slow or stop the spread of cancer cells. It is a monoclonal antibody. This medicine may be used for other purposes; ask your health care provider or pharmacist if you have questions. COMMON BRAND NAME(S): YERVOY What should I tell my care team before I take this medication? They need to know if you have any of these conditions: Allogeneic stem cell transplant (uses someone else's stem cells) Autoimmune diseases, such as Crohn disease, ulcerative colitis, lupus Nervous system problems, such as Guillain-Barre syndrome or myasthenia gravis Organ transplant An unusual or allergic reaction to ipilimumab, other medications, foods, dyes, or preservatives Pregnant or trying to get pregnant Breast-feeding How should I use this medication? This medication is infused into a vein. It is given by your care team in a hospital or clinic setting. A special MedGuide will be given to you before each treatment. Be sure to read this information carefully each time. Talk to your care team about the use of this medication in children. While it may be prescribed for children as young as 12 years for selected conditions, precautions do apply. Overdosage: If you think you have taken too much of this medicine contact a poison control center or emergency room at once. NOTE: This medicine is only for you. Do not share this medicine with others. What if I miss a dose? Keep appointments for follow-up doses. It is important not to miss your dose. Call your care team if you are unable to keep an appointment. What may interact with this medication? Interactions are not expected. This list may not describe all possible interactions. Give your health care provider a list of all the medicines, herbs, non-prescription drugs, or dietary supplements you use. Also tell them if you smoke,  drink alcohol, or use illegal drugs. Some items may interact with your medicine. What should I watch for while using this medication? Your condition will be monitored carefully while you are receiving this medication. You may need blood work while taking this medication. This medication may cause serious skin reactions. They can happen weeks to months after starting the medication. Contact your care team right away if you notice fevers or flu-like symptoms with a rash. The rash may be red or purple and then turn into blisters or peeling of the skin. You may also notice a red rash with swelling of the face, lips, or lymph nodes in your neck or under your arms. Tell your care team right away if you have any change in your eyesight. Talk to your care team if you may be pregnant. Serious birth defects can occur if you take this medication during pregnancy and for 3 months after the last dose. You will need a negative pregnancy test before starting this medication. Contraception is recommended while taking this medication and for 3 months after the last dose. Your care team can help you find the option that works for you. Do not breastfeed while taking this medication and for 3 months after the last dose. What side effects may I notice from receiving this medication? Side effects that you should report to your care team as soon as possible: Allergic reactions--skin rash, itching, hives, swelling of the face, lips, tongue, or throat Dry cough, shortness of breath or trouble breathing Eye pain, redness, irritation, or discharge with blurry or decreased vision Heart muscle inflammation--unusual weakness or fatigue, shortness of breath,   chest pain, fast or irregular heartbeat, dizziness, swelling of the ankles, feet, or hands Hormone gland problems--headache, sensitivity to light, unusual weakness or fatigue, dizziness, fast or irregular heartbeat, increased sensitivity to cold or heat, excessive sweating,  constipation, hair loss, increased thirst or amount of urine, tremors or shaking, irritability Infusion reactions--chest pain, shortness of breath or trouble breathing, feeling faint or lightheaded Kidney injury (glomerulonephritis)--decrease in the amount of urine, red or dark brown urine, foamy or bubbly urine, swelling of the ankles, hands, or feet Liver injury--right upper belly pain, loss of appetite, nausea, light-colored stool, dark yellow or brown urine, yellowing skin or eyes, unusual weakness or fatigue Pain, tingling, or numbness in the hands or feet, muscle weakness, change in vision, confusion or trouble speaking, loss of balance or coordination, trouble walking, seizures Rash, fever, and swollen lymph nodes Redness, blistering, peeling, or loosening of the skin, including inside the mouth Sudden or severe stomach pain, bloody diarrhea, fever, nausea, vomiting Side effects that usually do not require medical attention (report to your care team if they continue or are bothersome): Bone, joint, or muscle pain Diarrhea Fatigue Loss of appetite Nausea Skin rash This list may not describe all possible side effects. Call your doctor for medical advice about side effects. You may report side effects to FDA at 1-800-FDA-1088. Where should I keep my medication? This medication is given in a hospital or clinic. It will not be stored at home. NOTE: This sheet is a summary. It may not cover all possible information. If you have questions about this medicine, talk to your doctor, pharmacist, or health care provider.  2023 [employer] Standard (2021-12-20 00:00:00) Nivolumab Injection What is this medication? NIVOLUMAB (nye VOL ue mab) treats some types of cancer. It works by helping your immune system slow or stop the spread of cancer cells. It is a monoclonal antibody. This medicine may be used for other purposes; ask your health care provider or pharmacist if you have questions. COMMON  BRAND NAME(S): Opdivo What should I tell my care team before I take this medication? They need to know if you have any of these conditions: Allogeneic stem cell transplant (uses someone else's stem cells) Autoimmune diseases, such as Crohn disease, ulcerative colitis, lupus History of chest radiation Nervous system problems, such as Guillain-Barre syndrome or myasthenia gravis Organ transplant An unusual or allergic reaction to nivolumab, other medications, foods, dyes, or preservatives Pregnant or trying to get pregnant Breast-feeding How should I use this medication? This medication is infused into a vein. It is given in a hospital or clinic setting. A special MedGuide will be given to you before each treatment. Be sure to read this information carefully each time. Talk to your care team about the use of this medication in children. While it may be prescribed for children as young as 12 years for selected conditions, precautions do apply. Overdosage: If you think you have taken too much of this medicine contact a poison control center or emergency room at once. NOTE: This medicine is only for you. Do not share this medicine with others. What if I miss a dose? Keep appointments for follow-up doses. It is important not to miss your dose. Call your care team if you are unable to keep an appointment. What may interact with this medication? Interactions have not been studied. This list may not describe all possible interactions. Give your health care provider a list of all the medicines, herbs, non-prescription drugs, or dietary supplements you use. Also tell   them if you smoke, drink alcohol, or use illegal drugs. Some items may interact with your medicine. What should I watch for while using this medication? Your condition will be monitored carefully while you are receiving this medication. You may need blood work while taking this medication. This medication may cause serious skin reactions.  They can happen weeks to months after starting the medication. Contact your care team right away if you notice fevers or flu-like symptoms with a rash. The rash may be red or purple and then turn into blisters or peeling of the skin. You may also notice a red rash with swelling of the face, lips, or lymph nodes in your neck or under your arms. Tell your care team right away if you have any change in your eyesight. Talk to your care team if you are pregnant or think you might be pregnant. A negative pregnancy test is required before starting this medication. A reliable form of contraception is recommended while taking this medication and for 5 months after the last dose. Talk to your care team about effective forms of contraception. Do not breast-feed while taking this medication and for 5 months after the last dose. What side effects may I notice from receiving this medication? Side effects that you should report to your care team as soon as possible: Allergic reactions--skin rash, itching, hives, swelling of the face, lips, tongue, or throat Dry cough, shortness of breath or trouble breathing Eye pain, redness, irritation, or discharge with blurry or decreased vision Heart muscle inflammation--unusual weakness or fatigue, shortness of breath, chest pain, fast or irregular heartbeat, dizziness, swelling of the ankles, feet, or hands Hormone gland problems--headache, sensitivity to light, unusual weakness or fatigue, dizziness, fast or irregular heartbeat, increased sensitivity to cold or heat, excessive sweating, constipation, hair loss, increased thirst or amount of urine, tremors or shaking, irritability Infusion reactions--chest pain, shortness of breath or trouble breathing, feeling faint or lightheaded Kidney injury (glomerulonephritis)--decrease in the amount of urine, red or dark brown urine, foamy or bubbly urine, swelling of the ankles, hands, or feet Liver injury--right upper belly pain, loss  of appetite, nausea, light-colored stool, dark yellow or brown urine, yellowing skin or eyes, unusual weakness or fatigue Pain, tingling, or numbness in the hands or feet, muscle weakness, change in vision, confusion or trouble speaking, loss of balance or coordination, trouble walking, seizures Rash, fever, and swollen lymph nodes Redness, blistering, peeling, or loosening of the skin, including inside the mouth Sudden or severe stomach pain, bloody diarrhea, fever, nausea, vomiting Side effects that usually do not require medical attention (report these to your care team if they continue or are bothersome): Bone, joint, or muscle pain Diarrhea Fatigue Loss of appetite Nausea Skin rash This list may not describe all possible side effects. Call your doctor for medical advice about side effects. You may report side effects to FDA at 1-800-FDA-1088. Where should I keep my medication? This medication is given in a hospital or clinic. It will not be stored at home. NOTE: This sheet is a summary. It may not cover all possible information. If you have questions about this medicine, talk to your doctor, pharmacist, or health care provider.  2023 [employer] Standard (2021-07-08 00:00:00)  

## 2022-05-29 NOTE — Progress Notes (Signed)
Ipilimumab (YERVOY) Patient Monitoring Assessment   Is the patient experiencing any of the following general symptoms?:  '[]'$ Patient is not experiencing any of the general symptoms listed in this section.  '[]'$ Difficulty performing normal activities '[]'$ Feeling sluggish or cold all the time '[]'$ Unusual weight gain '[]'$ Constant or unusual headaches '[]'$ Feeling dizzy or faint '[]'$ Changes in eyesight (blurry vision, double vision, or other vision problems) '[]'$ Changes in mood or behavior (ex: decreased sex drive, irritability, or forgetfulness) '[]'$ Starting new medications (ex: steroids, other medications that lower immune response)   Gastrointestinal  Patient is having 1 bowel movements each day.  Is this different from baseline? '[]'$ Yes '[x]'$ No Are your stools watery or do they have a foul smell? '[]'$ Yes '[x]'$ No Have you seen blood in your stools? '[]'$ Yes '[x]'$ No Are your stools dark, tarry, or sticky? '[]'$ Yes '[x]'$ No Are you having pain or tenderness in your belly? '[]'$ Yes '[x]'$ No  Skin Does your skin itch? '[x]'$ Yes '[]'$ No Do you have a rash? '[]'$ Yes '[x]'$ No Has your skin blistered and/or peeled? '[]'$ Yes '[x]'$ No Do you have sores in your mouth? '[]'$ Yes '[x]'$ No  Hepatic Has your urine been dark or tea colored? '[]'$ Yes '[]'$ No Have you noticed your skin or the whites of your eyes are turning yellow? '[]'$ Yes '[x]'$ No Are you bleeding or bruising more easily than normal? '[]'$ Yes '[x]'$ No Are you nauseous and/or vomiting? '[]'$ Yes '[x]'$ No Do you have pain on the right side of your stomach? '[]'$ Yes '[x]'$ No  Neurologic  Are you having unusual weakness of legs, arms, or face? '[]'$ Yes '[x]'$ No Are you having numbness or tingling in your hands or feet? '[]'$ Yes '[x]'$ No  [name]

## 2022-05-30 ENCOUNTER — Other Ambulatory Visit: Payer: Self-pay

## 2022-05-31 ENCOUNTER — Encounter: Payer: Self-pay | Admitting: Oncology

## 2022-05-31 DIAGNOSIS — Z7902 Long term (current) use of antithrombotics/antiplatelets: Secondary | ICD-10-CM | POA: Diagnosis not present

## 2022-05-31 DIAGNOSIS — Z7982 Long term (current) use of aspirin: Secondary | ICD-10-CM | POA: Diagnosis not present

## 2022-05-31 DIAGNOSIS — Z87891 Personal history of nicotine dependence: Secondary | ICD-10-CM | POA: Diagnosis not present

## 2022-05-31 DIAGNOSIS — C4372 Malignant melanoma of left lower limb, including hip: Secondary | ICD-10-CM | POA: Diagnosis not present

## 2022-05-31 DIAGNOSIS — I10 Essential (primary) hypertension: Secondary | ICD-10-CM | POA: Diagnosis not present

## 2022-05-31 DIAGNOSIS — K219 Gastro-esophageal reflux disease without esophagitis: Secondary | ICD-10-CM | POA: Diagnosis not present

## 2022-05-31 DIAGNOSIS — Z483 Aftercare following surgery for neoplasm: Secondary | ICD-10-CM | POA: Diagnosis not present

## 2022-05-31 DIAGNOSIS — Z8673 Personal history of transient ischemic attack (TIA), and cerebral infarction without residual deficits: Secondary | ICD-10-CM | POA: Diagnosis not present

## 2022-06-01 DIAGNOSIS — Y839 Surgical procedure, unspecified as the cause of abnormal reaction of the patient, or of later complication, without mention of misadventure at the time of the procedure: Secondary | ICD-10-CM | POA: Diagnosis not present

## 2022-06-01 DIAGNOSIS — C439 Malignant melanoma of skin, unspecified: Secondary | ICD-10-CM | POA: Diagnosis not present

## 2022-06-01 DIAGNOSIS — T8189XA Other complications of procedures, not elsewhere classified, initial encounter: Secondary | ICD-10-CM | POA: Diagnosis not present

## 2022-06-06 DIAGNOSIS — K219 Gastro-esophageal reflux disease without esophagitis: Secondary | ICD-10-CM | POA: Diagnosis not present

## 2022-06-06 DIAGNOSIS — C4372 Malignant melanoma of left lower limb, including hip: Secondary | ICD-10-CM | POA: Diagnosis not present

## 2022-06-06 DIAGNOSIS — Z7982 Long term (current) use of aspirin: Secondary | ICD-10-CM | POA: Diagnosis not present

## 2022-06-06 DIAGNOSIS — Z7902 Long term (current) use of antithrombotics/antiplatelets: Secondary | ICD-10-CM | POA: Diagnosis not present

## 2022-06-06 DIAGNOSIS — I10 Essential (primary) hypertension: Secondary | ICD-10-CM | POA: Diagnosis not present

## 2022-06-06 DIAGNOSIS — Z87891 Personal history of nicotine dependence: Secondary | ICD-10-CM | POA: Diagnosis not present

## 2022-06-06 DIAGNOSIS — Z483 Aftercare following surgery for neoplasm: Secondary | ICD-10-CM | POA: Diagnosis not present

## 2022-06-06 DIAGNOSIS — Z8673 Personal history of transient ischemic attack (TIA), and cerebral infarction without residual deficits: Secondary | ICD-10-CM | POA: Diagnosis not present

## 2022-06-08 DIAGNOSIS — C439 Malignant melanoma of skin, unspecified: Secondary | ICD-10-CM | POA: Diagnosis not present

## 2022-06-08 DIAGNOSIS — T8189XA Other complications of procedures, not elsewhere classified, initial encounter: Secondary | ICD-10-CM | POA: Diagnosis not present

## 2022-06-14 DIAGNOSIS — C439 Malignant melanoma of skin, unspecified: Secondary | ICD-10-CM | POA: Diagnosis not present

## 2022-06-14 DIAGNOSIS — T8189XA Other complications of procedures, not elsewhere classified, initial encounter: Secondary | ICD-10-CM | POA: Diagnosis not present

## 2022-06-16 ENCOUNTER — Inpatient Hospital Stay: Payer: Medicare HMO

## 2022-06-16 ENCOUNTER — Inpatient Hospital Stay (INDEPENDENT_AMBULATORY_CARE_PROVIDER_SITE_OTHER): Payer: Medicare HMO | Admitting: Hematology and Oncology

## 2022-06-16 ENCOUNTER — Encounter: Payer: Self-pay | Admitting: Hematology and Oncology

## 2022-06-16 DIAGNOSIS — C792 Secondary malignant neoplasm of skin: Secondary | ICD-10-CM

## 2022-06-16 DIAGNOSIS — D72829 Elevated white blood cell count, unspecified: Secondary | ICD-10-CM | POA: Diagnosis not present

## 2022-06-16 DIAGNOSIS — C439 Malignant melanoma of skin, unspecified: Secondary | ICD-10-CM

## 2022-06-16 DIAGNOSIS — D649 Anemia, unspecified: Secondary | ICD-10-CM | POA: Diagnosis not present

## 2022-06-16 HISTORY — DX: Elevated white blood cell count, unspecified: D72.829

## 2022-06-16 LAB — HEPATIC FUNCTION PANEL
ALT: 23 U/L (ref 10–40)
AST: 29 (ref 14–40)
Alkaline Phosphatase: 107 (ref 25–125)
Bilirubin, Total: 0.9

## 2022-06-16 LAB — CBC AND DIFFERENTIAL
HCT: 42 (ref 41–53)
Hemoglobin: 14 (ref 13.5–17.5)
Neutrophils Absolute: 8.9
Platelets: 250 10*3/uL (ref 150–400)
WBC: 13.9

## 2022-06-16 LAB — BASIC METABOLIC PANEL
BUN: 20 (ref 4–21)
CO2: 24 — AB (ref 13–22)
Chloride: 105 (ref 99–108)
Creatinine: 0.9 (ref 0.6–1.3)
Glucose: 96
Potassium: 3.7 mEq/L (ref 3.5–5.1)
Sodium: 139 (ref 137–147)

## 2022-06-16 LAB — COMPREHENSIVE METABOLIC PANEL
Albumin: 4.3 (ref 3.5–5.0)
Calcium: 9.2 (ref 8.7–10.7)

## 2022-06-16 LAB — CBC: RBC: 5.07 (ref 3.87–5.11)

## 2022-06-16 MED FILL — Ipilimumab Soln for IV Infusion 200 MG/40ML (5 MG/ML): INTRAVENOUS | Qty: 20 | Status: AC

## 2022-06-16 MED FILL — Nivolumab IV Soln 100 MG/10ML: INTRAVENOUS | Qty: 30 | Status: AC

## 2022-06-16 NOTE — Assessment & Plan Note (Addendum)
He has had a leukocytosis for months.  He has never had symptoms of infection.  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  The white count went up to 31,000 in September with 82% neutrophils, so appeared to be a leukemoid reaction.  A urine culture at that time was negative.  The white blood cell count has since decreased, but remains elevated in the same range as previous.  The patient remains without symptoms of infection.

## 2022-06-16 NOTE — Assessment & Plan Note (Signed)
Melanoma of the left foot and left calf diagnosed on shave biopsy at dermatology in June.  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  The left calf lesion was consistent with metastatic melanoma.  CT staging revealed multiple pulmonary nodules concerning for metastatic disease.  PET scan revealed increased uptake in the left foot.  The pulmonary nodules were suboptimally evaluated, but still highly suspicious for metastatic disease. He is receiving palliative ipilumumab/nivolumab.  He continues to tolerate this well, except for mild itching and rash.  He will proceed with a 4th cycle this week.  We will plan to see him back in 3 weeks for repeat clinical assessment prior to 5th cycle, which will now consist of single agent nivolumab.

## 2022-06-16 NOTE — Progress Notes (Signed)
[name]  [address] [place],  [place]  [address] [phone]  Clinic Day:  06/16/2022  Referring physician: Derwood Kaplan, MD  ASSESSMENT & PLAN:   Assessment & Plan: Malignant melanoma, metastatic ([place]) Melanoma of the left foot and left calf diagnosed on shave biopsy at dermatology in June.  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  The left calf lesion was consistent with metastatic melanoma.  CT staging revealed multiple pulmonary nodules concerning for metastatic disease.  PET scan revealed increased uptake in the left foot.  The pulmonary nodules were suboptimally evaluated, but still highly suspicious for metastatic disease. He is receiving palliative ipilumumab/nivolumab.  He continues to tolerate this well, except for mild itching and rash.  He will proceed with a 4th cycle this week.  We will plan to see him back in 3 weeks for repeat clinical assessment prior to 5th cycle, which will now consist of single agent nivolumab.  Leukocytosis He has had a leukocytosis for months.  He has never had symptoms of infection.  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  The white count went up to 31,000 in September with 82% neutrophils, so appeared to be a leukemoid reaction.  A urine culture at that time was negative.  The white blood cell count has since decreased, but remains elevated in the same range as previous.  The patient remains without symptoms of infection.   The patient understands the plans discussed today and is in agreement with them.  He knows to contact our office if he develops concerns prior to his next appointment.   I provided 15 minutes of face-to-face time during this encounter and > 50% was spent counseling as documented under my assessment and plan.    Benjamin Pickles, PA-C   [facility] [address] [place] [address] Dept: [phone] Dept Fax: [phone]   Orders Placed This Encounter  Procedures   CBC and differential    This external order was created through the Results Console.   CBC    This external order was created through the Results Console.   Basic metabolic panel    This external order was created through the Results Console.   Comprehensive metabolic panel    This external order was created through the Results Console.   Hepatic function panel    This external order was created through the Results Console.      CHIEF COMPLAINT:  CC: Metastatic melanoma  Current Treatment: Ipilimumab/nivolumab  HISTORY OF PRESENT ILLNESS:   Oncology History  Malignant melanoma, metastatic ([place])  01/23/2022 Cancer Staging   Staging form: Melanoma of the Skin, AJCC 8th Edition - Clinical stage from 01/23/2022: Stage III (cT2b, cN1c, cM0) - Signed by Derwood Kaplan, MD on 04/07/2022 Histopathologic type: Malignant melanoma, NOS (except juvenile melanoma M-8770/[id number]) Stage prefix: Initial diagnosis Laterality: Left Lymph-vascular invasion (LVI): LVI not present (absent)/not identified Diagnostic confirmation: Positive histology Specimen type: Biopsy / Limited Resection Staged by: Managing physician Mitotic count: 4 Mitotic unit: mm2 Clark's level: Level IV Tumor-infiltrating lymphocytes: Present and non-brisk Breslow depth (mm): 16 Ulceration of the epidermis: Yes Microsatellites: No Primary tumor regression: Absent Intransit/satellite metastasis: In-transit Lymph node clinically or radiologically detected: No Microscopic confirmation of metastasis: No Sentinel lymph node biopsy  performed: No Completion or therapeutic lymph node dissection performed: No Matted nodes: No Stage used in treatment planning: Yes National guidelines used in treatment planning: Yes Type of  national guideline used in treatment planning: NCCN   01/31/2022 Initial Diagnosis   Malignant melanoma, metastatic ([place])   04/14/2022 -  Chemotherapy   Patient is on Treatment Plan : MELANOMA Nivolumab (3) + Ipilimumab (1) q21d / Nivolumab (240) q14d     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[place])  02/28/2022 Cancer Staging   Staging form: Melanoma of the Skin, AJCC 8th Edition - Clinical stage from 02/28/2022: Stage IV (cT2b(m), cN1c, cM1a) - Signed by Derwood Kaplan, MD on 04/07/2022 Histopathologic type: Malignant melanoma, NOS (except juvenile melanoma M-8770/[id number]) Stage prefix: Initial diagnosis Laterality: Left Multiple tumors: Yes Lymph-vascular invasion (LVI): LVI present/identified, NOS Diagnostic confirmation: Positive histology Specimen type: Excision Staged by: Managing physician Intransit/satellite metastasis: Both in-transit and satellite Prognostic indicators: 02/28/22:  Wide excision of primary of foot - no residual melanoma,  Excision of skin of calf consistent with intransit mets of dermal/subcuticular   Melanoma with LVI, metastatic disease with multifocal lesions and multifocal positive margins 03/24/22: Re-excision with a few atypical melanocytes, no melanoma and clear margins   04/07/2022 Initial Diagnosis   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  He continues to tolerate this well, except for mild itching and rash across the abdomen.  He is using topical steroids with improvement.[name]  He follows with the wound center weekly and has home health, change his dressing left lateral knee weekly as well.  He continues to deny any symptoms of infection.  He has persistent left lower extremity edema and is  using his compression hose today.  He wanted to be sure this was okay as his wound is covered by the compression hose.  As long as it is not painful, it should be fine.  He denies fevers or chills. He denies pain. His appetite is good. His weight has decreased 5 pounds over last 3 weeks .  REVIEW OF SYSTEMS:  Review of Systems  Constitutional:  Negative for appetite change, chills, fatigue, fever and unexpected weight change.  HENT:   Negative for lump/mass, mouth sores and sore throat.   Respiratory:  Negative for cough and shortness of breath.   Cardiovascular:  Negative for chest pain and leg swelling.  Gastrointestinal:  Negative for abdominal pain, constipation, diarrhea, nausea and vomiting.  Genitourinary:  Negative for difficulty urinating, dysuria, frequency and hematuria.   Musculoskeletal:  Negative for arthralgias, back pain and myalgias.  Skin:  Negative for itching, rash and wound.  Neurological:  Negative for dizziness, extremity weakness, headaches, light-headedness and numbness.  Hematological:  Negative for adenopathy.  Psychiatric/Behavioral:  Negative for depression and sleep disturbance. The patient is not nervous/anxious.      VITALS:  Blood pressure (!) 163/78, pulse 92, temperature 98.6 F (37 C), temperature source Oral, resp. rate 20, height 5' 9.69" (1.77 m), weight 225 lb 11.2 oz (102.4 kg), SpO2 94 %.  Wt Readings from Last 3 Encounters:  06/16/22 225 lb 11.2 oz (102.4 kg)  05/29/22 229 lb (103.9 kg)  05/26/22 229 lb (103.9 kg)    Body mass  index is 32.67 kg/m.  Performance status (ECOG): 1 - Symptomatic but completely ambulatory  PHYSICAL EXAM:  Physical Exam Vitals and nursing note reviewed.  Constitutional:      General: He is not in acute distress.    Appearance: Normal appearance. He is normal weight.  HENT:     Head: Normocephalic and atraumatic.     Mouth/Throat:     Mouth: Mucous membranes are moist.     Pharynx: Oropharynx is clear. No  oropharyngeal exudate or posterior oropharyngeal erythema.  Eyes:     General: No scleral icterus.    Extraocular Movements: Extraocular movements intact.     Conjunctiva/sclera: Conjunctivae normal.     Pupils: Pupils are equal, round, and reactive to light.  Cardiovascular:     Rate and Rhythm: Normal rate and regular rhythm.     Heart sounds: Normal heart sounds. No murmur heard.    No friction rub. No gallop.  Pulmonary:     Effort: Pulmonary effort is normal.     Breath sounds: Normal breath sounds. No wheezing, rhonchi or rales.  Abdominal:     General: Bowel sounds are normal. There is no distension.     Palpations: Abdomen is soft. There is no hepatomegaly, splenomegaly or mass.     Tenderness: There is no abdominal tenderness.  Musculoskeletal:        General: Normal range of motion.     Cervical back: Normal range of motion and neck supple. No tenderness.     Right lower leg: No edema.     Left lower leg: No edema.  Lymphadenopathy:     Cervical: No cervical adenopathy.     Upper Body:     Right upper body: No supraclavicular or axillary adenopathy.     Left upper body: No supraclavicular or axillary adenopathy.     Lower Body: No right inguinal adenopathy. No left inguinal adenopathy.  Skin:    General: Skin is warm and dry.     Coloration: Skin is not jaundiced.     Findings: Rash (Slight erythematous rash of the abdomen) present.  Neurological:     Mental Status: He is alert and oriented to person, place, and time.     Cranial Nerves: No cranial nerve deficit.  Psychiatric:        Mood and Affect: Mood normal.        Behavior: Behavior normal.        Thought Content: Thought content normal.     LABS:      Latest Ref Rng & Units 06/16/2022   12:00 AM 05/26/2022   12:00 AM 05/04/2022   12:00 AM  CBC  WBC  13.9     12.1     31.0      Hemoglobin 13.5 - 17.5 14.0     13.8     13.4      Hematocrit 41 - 53 42     41     40      Platelets 150 - 400 K/uL 250      351     230         This result is from an external source.      Latest Ref Rng & Units 06/16/2022   12:00 AM 05/26/2022   12:00 AM 05/04/2022   12:00 AM  CMP  BUN 4 - '21 20     19     23      '$ Creatinine 0.6 - 1.3 0.9  0.9     0.8      Sodium 137 - 147 139     142     137      Potassium 3.5 - 5.1 mEq/L 3.7     3.9     4.1      Chloride 99 - 108 105     105     103      CO2 13 - '22 24     27     26      '$ Calcium 8.7 - 10.7 9.2     9.4     9.1      Alkaline Phos 25 - 125 107     110     95      AST 14 - 40 29     37     26      ALT 10 - 40 U/L '23     20     20         '$ This result is from an external source.     No results found for: "CEA1", "CEA" / No results found for: "CEA1", "CEA" No results found for: "PSA1" No results found for: "HYQ657" No results found for: "CAN125"  No results found for: "TOTALPROTELP", "ALBUMINELP", "A1GS", "A2GS", "BETS", "BETA2SER", "GAMS", "MSPIKE", "SPEI" No results found for: "TIBC", "FERRITIN", "IRONPCTSAT" Lab Results  Component Value Date   LDH 160 02/07/2022    STUDIES:  No results found.    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     Past Surgical History:  Procedure Laterality Date   LUMBAR LAMINECTOMY/DECOMPRESSION MICRODISCECTOMY N/A 01/26/2022   Procedure: Microlumbar decompression Lumbar four-five, lateral mass fusion with autograft and allograft bone;  Surgeon: Susa Day, MD;  Location: [place];  Service: Orthopedics;  Laterality: N/A;   PARTIAL GASTRECTOMY     TOTAL HIP ARTHROPLASTY Right 06/18/2018   Procedure: RIGHT TOTAL HIP ARTHROPLASTY ANTERIOR APPROACH;  Surgeon: [name], MD;  Location: [facility];  Service: Orthopedics;  Laterality: Right;    Family  History  Problem Relation Age of Onset   Melanoma Brother     Social History:  reports that he has quit smoking. He has never used smokeless tobacco. He reports that he does not currently use alcohol. He reports that he does not use drugs.The patient is accompanied by his wife today.  Allergies:  Allergies  Allergen Reactions   Morphine Itching    Patient states it is tolerable itching    Current Medications: Current Outpatient Medications  Medication Sig Dispense Refill   amLODipine (NORVASC) 5 MG tablet Take 5 mg by mouth daily.     clopidogrel (PLAVIX) 75 MG tablet Take 1 tablet by mouth daily.     docusate sodium (COLACE) 100 MG capsule 1 capsule 2 TIMES DAILY (route: oral)     famotidine (PEPCID) 40 MG tablet Take 1 tablet (40 mg total) by mouth daily. 30 tablet 5   Flaxseed, Linseed, (FLAXSEED OIL) 1000 MG CAPS Take 1,000 mg by mouth daily.     folic acid (FOLVITE) 846 MCG tablet Take 400 mcg by mouth daily.  gabapentin (NEURONTIN) 300 MG capsule Take 1 capsule (300 mg total) by mouth 3 (three) times daily. 90 capsule 2   hydrochlorothiazide (HYDRODIURIL) 12.5 MG tablet Take 12.5 mg by mouth daily.     lisinopril (ZESTRIL) 20 MG tablet      Multiple Vitamin (MULTIVITAMIN WITH MINERALS) TABS tablet Take 1 tablet by mouth in the morning.     mupirocin ointment (BACTROBAN) 2 % Apply topically.     Omega-3 1000 MG CAPS Take by mouth.     ondansetron (ZOFRAN) 4 MG tablet Take 1 tablet (4 mg total) by mouth every 4 (four) hours as needed for nausea. 90 tablet 3   oxyCODONE (OXY IR/ROXICODONE) 5 MG immediate release tablet Take 1 tablet (5 mg total) by mouth every 6 (six) hours as needed for severe pain. 30 tablet 0   polyethylene glycol (MIRALAX / GLYCOLAX) 17 g packet Take 17 g by mouth daily. 14 each 0   polyethylene glycol powder (GLYCOLAX/MIRALAX) 17 GM/SCOOP powder SMARTSIG:17 Gram(s) By Mouth Daily PRN     pravastatin (PRAVACHOL) 40 MG tablet Take 40 mg by mouth daily.      prochlorperazine (COMPAZINE) 10 MG tablet Take 1 tablet (10 mg total) by mouth every 6 (six) hours as needed for nausea or vomiting. 90 tablet 3   traZODone (DESYREL) 50 MG tablet Take 50 mg by mouth at bedtime as needed for sleep.     No current facility-administered medications for this visit.

## 2022-06-17 ENCOUNTER — Encounter: Payer: Self-pay | Admitting: Oncology

## 2022-06-19 ENCOUNTER — Inpatient Hospital Stay: Payer: Medicare HMO

## 2022-06-19 ENCOUNTER — Encounter: Payer: Self-pay | Admitting: Oncology

## 2022-06-19 VITALS — BP 153/72 | HR 68 | Temp 98.4°F | Resp 18 | Wt 227.0 lb

## 2022-06-19 DIAGNOSIS — C439 Malignant melanoma of skin, unspecified: Secondary | ICD-10-CM

## 2022-06-19 DIAGNOSIS — C792 Secondary malignant neoplasm of skin: Secondary | ICD-10-CM

## 2022-06-19 DIAGNOSIS — Z5112 Encounter for antineoplastic immunotherapy: Secondary | ICD-10-CM | POA: Diagnosis not present

## 2022-06-19 DIAGNOSIS — C4372 Malignant melanoma of left lower limb, including hip: Secondary | ICD-10-CM | POA: Diagnosis not present

## 2022-06-19 DIAGNOSIS — Z79899 Other long term (current) drug therapy: Secondary | ICD-10-CM | POA: Diagnosis not present

## 2022-06-19 MED ORDER — FAMOTIDINE IN NACL 20-0.9 MG/50ML-% IV SOLN
20.0000 mg | Freq: Once | INTRAVENOUS | Status: AC
  Administered 2022-06-19: 20 mg via INTRAVENOUS
  Filled 2022-06-19: qty 50

## 2022-06-19 MED ORDER — SODIUM CHLORIDE 0.9% FLUSH
10.0000 mL | INTRAVENOUS | Status: DC | PRN
  Administered 2022-06-19: 10 mL

## 2022-06-19 MED ORDER — SODIUM CHLORIDE 0.9 % IV SOLN: Freq: Once | INTRAVENOUS | Status: AC

## 2022-06-19 MED ORDER — SODIUM CHLORIDE 0.9 % IV SOLN
1.0000 mg/kg | Freq: Once | INTRAVENOUS | Status: AC
  Administered 2022-06-19: 100 mg via INTRAVENOUS
  Filled 2022-06-19: qty 20

## 2022-06-19 MED ORDER — SODIUM CHLORIDE 0.9 % IV SOLN
2.8900 mg/kg | Freq: Once | INTRAVENOUS | Status: AC
  Administered 2022-06-19: 300 mg via INTRAVENOUS
  Filled 2022-06-19: qty 30

## 2022-06-19 MED ORDER — HEPARIN SOD (PORK) LOCK FLUSH 100 UNIT/ML IV SOLN
500.0000 [IU] | Freq: Once | INTRAVENOUS | Status: AC | PRN
  Administered 2022-06-19: 500 [IU]

## 2022-06-19 MED ORDER — DIPHENHYDRAMINE HCL 50 MG/ML IJ SOLN
25.0000 mg | Freq: Once | INTRAMUSCULAR | Status: AC
  Administered 2022-06-19: 25 mg via INTRAVENOUS
  Filled 2022-06-19: qty 1

## 2022-06-19 NOTE — Patient Instructions (Signed)
Ipilimumab Injection What is this medication? IPILIMUMAB (IP i LIM ue mab) treats some types of cancer. It works by helping your immune system slow or stop the spread of cancer cells. It is a monoclonal antibody. This medicine may be used for other purposes; ask your health care provider or pharmacist if you have questions. COMMON BRAND NAME(S): YERVOY What should I tell my care team before I take this medication? They need to know if you have any of these conditions: Allogeneic stem cell transplant (uses someone else's stem cells) Autoimmune diseases, such as Crohn disease, ulcerative colitis, lupus Nervous system problems, such as Guillain-Barre syndrome or myasthenia gravis Organ transplant An unusual or allergic reaction to ipilimumab, other medications, foods, dyes, or preservatives Pregnant or trying to get pregnant Breast-feeding How should I use this medication? This medication is infused into a vein. It is given by your care team in a hospital or clinic setting. A special MedGuide will be given to you before each treatment. Be sure to read this information carefully each time. Talk to your care team about the use of this medication in children. While it may be prescribed for children as young as 12 years for selected conditions, precautions do apply. Overdosage: If you think you have taken too much of this medicine contact a poison control center or emergency room at once. NOTE: This medicine is only for you. Do not share this medicine with others. What if I miss a dose? Keep appointments for follow-up doses. It is important not to miss your dose. Call your care team if you are unable to keep an appointment. What may interact with this medication? Interactions are not expected. This list may not describe all possible interactions. Give your health care provider a list of all the medicines, herbs, non-prescription drugs, or dietary supplements you use. Also tell them if you smoke,  drink alcohol, or use illegal drugs. Some items may interact with your medicine. What should I watch for while using this medication? Your condition will be monitored carefully while you are receiving this medication. You may need blood work while taking this medication. This medication may cause serious skin reactions. They can happen weeks to months after starting the medication. Contact your care team right away if you notice fevers or flu-like symptoms with a rash. The rash may be red or purple and then turn into blisters or peeling of the skin. You may also notice a red rash with swelling of the face, lips, or lymph nodes in your neck or under your arms. Tell your care team right away if you have any change in your eyesight. Talk to your care team if you may be pregnant. Serious birth defects can occur if you take this medication during pregnancy and for 3 months after the last dose. You will need a negative pregnancy test before starting this medication. Contraception is recommended while taking this medication and for 3 months after the last dose. Your care team can help you find the option that works for you. Do not breastfeed while taking this medication and for 3 months after the last dose. What side effects may I notice from receiving this medication? Side effects that you should report to your care team as soon as possible: Allergic reactions--skin rash, itching, hives, swelling of the face, lips, tongue, or throat Dry cough, shortness of breath or trouble breathing Eye pain, redness, irritation, or discharge with blurry or decreased vision Heart muscle inflammation--unusual weakness or fatigue, shortness of breath,  chest pain, fast or irregular heartbeat, dizziness, swelling of the ankles, feet, or hands Hormone gland problems--headache, sensitivity to light, unusual weakness or fatigue, dizziness, fast or irregular heartbeat, increased sensitivity to cold or heat, excessive sweating,  constipation, hair loss, increased thirst or amount of urine, tremors or shaking, irritability Infusion reactions--chest pain, shortness of breath or trouble breathing, feeling faint or lightheaded Kidney injury (glomerulonephritis)--decrease in the amount of urine, red or dark brown urine, foamy or bubbly urine, swelling of the ankles, hands, or feet Liver injury--right upper belly pain, loss of appetite, nausea, light-colored stool, dark yellow or brown urine, yellowing skin or eyes, unusual weakness or fatigue Pain, tingling, or numbness in the hands or feet, muscle weakness, change in vision, confusion or trouble speaking, loss of balance or coordination, trouble walking, seizures Rash, fever, and swollen lymph nodes Redness, blistering, peeling, or loosening of the skin, including inside the mouth Sudden or severe stomach pain, bloody diarrhea, fever, nausea, vomiting Side effects that usually do not require medical attention (report to your care team if they continue or are bothersome): Bone, joint, or muscle pain Diarrhea Fatigue Loss of appetite Nausea Skin rash This list may not describe all possible side effects. Call your doctor for medical advice about side effects. You may report side effects to FDA at 1-800-FDA-1088. Where should I keep my medication? This medication is given in a hospital or clinic. It will not be stored at home. NOTE: This sheet is a summary. It may not cover all possible information. If you have questions about this medicine, talk to your doctor, pharmacist, or health care provider.  2023 [employer] Standard (2021-12-20 00:00:00) Nivolumab Injection What is this medication? NIVOLUMAB (nye VOL ue mab) treats some types of cancer. It works by helping your immune system slow or stop the spread of cancer cells. It is a monoclonal antibody. This medicine may be used for other purposes; ask your health care provider or pharmacist if you have questions. COMMON  BRAND NAME(S): Opdivo What should I tell my care team before I take this medication? They need to know if you have any of these conditions: Allogeneic stem cell transplant (uses someone else's stem cells) Autoimmune diseases, such as Crohn disease, ulcerative colitis, lupus History of chest radiation Nervous system problems, such as Guillain-Barre syndrome or myasthenia gravis Organ transplant An unusual or allergic reaction to nivolumab, other medications, foods, dyes, or preservatives Pregnant or trying to get pregnant Breast-feeding How should I use this medication? This medication is infused into a vein. It is given in a hospital or clinic setting. A special MedGuide will be given to you before each treatment. Be sure to read this information carefully each time. Talk to your care team about the use of this medication in children. While it may be prescribed for children as young as 12 years for selected conditions, precautions do apply. Overdosage: If you think you have taken too much of this medicine contact a poison control center or emergency room at once. NOTE: This medicine is only for you. Do not share this medicine with others. What if I miss a dose? Keep appointments for follow-up doses. It is important not to miss your dose. Call your care team if you are unable to keep an appointment. What may interact with this medication? Interactions have not been studied. This list may not describe all possible interactions. Give your health care provider a list of all the medicines, herbs, non-prescription drugs, or dietary supplements you use. Also tell   them if you smoke, drink alcohol, or use illegal drugs. Some items may interact with your medicine. What should I watch for while using this medication? Your condition will be monitored carefully while you are receiving this medication. You may need blood work while taking this medication. This medication may cause serious skin reactions.  They can happen weeks to months after starting the medication. Contact your care team right away if you notice fevers or flu-like symptoms with a rash. The rash may be red or purple and then turn into blisters or peeling of the skin. You may also notice a red rash with swelling of the face, lips, or lymph nodes in your neck or under your arms. Tell your care team right away if you have any change in your eyesight. Talk to your care team if you are pregnant or think you might be pregnant. A negative pregnancy test is required before starting this medication. A reliable form of contraception is recommended while taking this medication and for 5 months after the last dose. Talk to your care team about effective forms of contraception. Do not breast-feed while taking this medication and for 5 months after the last dose. What side effects may I notice from receiving this medication? Side effects that you should report to your care team as soon as possible: Allergic reactions--skin rash, itching, hives, swelling of the face, lips, tongue, or throat Dry cough, shortness of breath or trouble breathing Eye pain, redness, irritation, or discharge with blurry or decreased vision Heart muscle inflammation--unusual weakness or fatigue, shortness of breath, chest pain, fast or irregular heartbeat, dizziness, swelling of the ankles, feet, or hands Hormone gland problems--headache, sensitivity to light, unusual weakness or fatigue, dizziness, fast or irregular heartbeat, increased sensitivity to cold or heat, excessive sweating, constipation, hair loss, increased thirst or amount of urine, tremors or shaking, irritability Infusion reactions--chest pain, shortness of breath or trouble breathing, feeling faint or lightheaded Kidney injury (glomerulonephritis)--decrease in the amount of urine, red or dark brown urine, foamy or bubbly urine, swelling of the ankles, hands, or feet Liver injury--right upper belly pain, loss  of appetite, nausea, light-colored stool, dark yellow or brown urine, yellowing skin or eyes, unusual weakness or fatigue Pain, tingling, or numbness in the hands or feet, muscle weakness, change in vision, confusion or trouble speaking, loss of balance or coordination, trouble walking, seizures Rash, fever, and swollen lymph nodes Redness, blistering, peeling, or loosening of the skin, including inside the mouth Sudden or severe stomach pain, bloody diarrhea, fever, nausea, vomiting Side effects that usually do not require medical attention (report these to your care team if they continue or are bothersome): Bone, joint, or muscle pain Diarrhea Fatigue Loss of appetite Nausea Skin rash This list may not describe all possible side effects. Call your doctor for medical advice about side effects. You may report side effects to FDA at 1-800-FDA-1088. Where should I keep my medication? This medication is given in a hospital or clinic. It will not be stored at home. NOTE: This sheet is a summary. It may not cover all possible information. If you have questions about this medicine, talk to your doctor, pharmacist, or health care provider.  2023 [employer] Standard (2021-12-05 00:00:00)

## 2022-06-19 NOTE — Progress Notes (Signed)
Ipilimumab (YERVOY) Patient Monitoring Assessment   Is the patient experiencing any of the following general symptoms?:  '[]'$ Patient is not experiencing any of the general symptoms listed in this section.  '[]'$ Difficulty performing normal activities '[]'$ Feeling sluggish or cold all the time '[]'$ Unusual weight gain '[]'$ Constant or unusual headaches '[]'$ Feeling dizzy or faint '[]'$ Changes in eyesight (blurry vision, double vision, or other vision problems) '[]'$ Changes in mood or behavior (ex: decreased sex drive, irritability, or forgetfulness) '[]'$ Starting new medications (ex: steroids, other medications that lower immune response)   Gastrointestinal  Patient is having 1 bowel movements each day.  Is this different from baseline? '[]'$ Yes '[x]'$ No Are your stools watery or do they have a foul smell? '[]'$ Yes '[x]'$ No Have you seen blood in your stools? '[]'$ Yes '[x]'$ No Are your stools dark, tarry, or sticky? '[]'$ Yes '[]'$ No Are you having pain or tenderness in your belly? '[]'$ Yes '[]'$ No  Skin Does your skin itch? '[x]'$ Yes '[]'$ No Do you have a rash? '[]'$ Yes '[x]'$ No Has your skin blistered and/or peeled? '[]'$ Yes '[x]'$ No Do you have sores in your mouth? '[]'$ Yes '[x]'$ No  Hepatic Has your urine been dark or tea colored? '[]'$ Yes '[x]'$ No Have you noticed your skin or the whites of your eyes are turning yellow? '[]'$ Yes '[]'$ No Are you bleeding or bruising more easily than normal? '[]'$ Yes '[x]'$ No Are you nauseous and/or vomiting? '[]'$ Yes '[x]'$ No Do you have pain on the right side of your stomach? '[]'$ Yes '[x]'$ No  Neurologic  Are you having unusual weakness of legs, arms, or face? '[]'$ Yes '[x]'$ No Are you having numbness or tingling in your hands or feet? '[]'$ Yes '[x]'$ No  [name]

## 2022-06-21 DIAGNOSIS — T8189XA Other complications of procedures, not elsewhere classified, initial encounter: Secondary | ICD-10-CM | POA: Diagnosis not present

## 2022-06-21 DIAGNOSIS — C439 Malignant melanoma of skin, unspecified: Secondary | ICD-10-CM | POA: Diagnosis not present

## 2022-06-22 DIAGNOSIS — I10 Essential (primary) hypertension: Secondary | ICD-10-CM | POA: Diagnosis not present

## 2022-06-22 DIAGNOSIS — Z7902 Long term (current) use of antithrombotics/antiplatelets: Secondary | ICD-10-CM | POA: Diagnosis not present

## 2022-06-22 DIAGNOSIS — Z8673 Personal history of transient ischemic attack (TIA), and cerebral infarction without residual deficits: Secondary | ICD-10-CM | POA: Diagnosis not present

## 2022-06-22 DIAGNOSIS — K219 Gastro-esophageal reflux disease without esophagitis: Secondary | ICD-10-CM | POA: Diagnosis not present

## 2022-06-22 DIAGNOSIS — Z7982 Long term (current) use of aspirin: Secondary | ICD-10-CM | POA: Diagnosis not present

## 2022-06-22 DIAGNOSIS — Z483 Aftercare following surgery for neoplasm: Secondary | ICD-10-CM | POA: Diagnosis not present

## 2022-06-22 DIAGNOSIS — Z87891 Personal history of nicotine dependence: Secondary | ICD-10-CM | POA: Diagnosis not present

## 2022-06-22 DIAGNOSIS — C4372 Malignant melanoma of left lower limb, including hip: Secondary | ICD-10-CM | POA: Diagnosis not present

## 2022-06-27 DIAGNOSIS — Z87891 Personal history of nicotine dependence: Secondary | ICD-10-CM | POA: Diagnosis not present

## 2022-06-27 DIAGNOSIS — K219 Gastro-esophageal reflux disease without esophagitis: Secondary | ICD-10-CM | POA: Diagnosis not present

## 2022-06-27 DIAGNOSIS — C4372 Malignant melanoma of left lower limb, including hip: Secondary | ICD-10-CM | POA: Diagnosis not present

## 2022-06-27 DIAGNOSIS — I10 Essential (primary) hypertension: Secondary | ICD-10-CM | POA: Diagnosis not present

## 2022-06-27 DIAGNOSIS — Z7982 Long term (current) use of aspirin: Secondary | ICD-10-CM | POA: Diagnosis not present

## 2022-06-27 DIAGNOSIS — Z7902 Long term (current) use of antithrombotics/antiplatelets: Secondary | ICD-10-CM | POA: Diagnosis not present

## 2022-06-27 DIAGNOSIS — Z483 Aftercare following surgery for neoplasm: Secondary | ICD-10-CM | POA: Diagnosis not present

## 2022-06-27 DIAGNOSIS — Z8673 Personal history of transient ischemic attack (TIA), and cerebral infarction without residual deficits: Secondary | ICD-10-CM | POA: Diagnosis not present

## 2022-06-28 DIAGNOSIS — T8189XA Other complications of procedures, not elsewhere classified, initial encounter: Secondary | ICD-10-CM | POA: Diagnosis not present

## 2022-06-28 DIAGNOSIS — C439 Malignant melanoma of skin, unspecified: Secondary | ICD-10-CM | POA: Diagnosis not present

## 2022-07-05 ENCOUNTER — Other Ambulatory Visit: Payer: Self-pay | Admitting: Oncology

## 2022-07-05 DIAGNOSIS — C792 Secondary malignant neoplasm of skin: Secondary | ICD-10-CM

## 2022-07-05 DIAGNOSIS — C439 Malignant melanoma of skin, unspecified: Secondary | ICD-10-CM | POA: Diagnosis not present

## 2022-07-05 DIAGNOSIS — T8189XA Other complications of procedures, not elsewhere classified, initial encounter: Secondary | ICD-10-CM | POA: Diagnosis not present

## 2022-07-06 DIAGNOSIS — K219 Gastro-esophageal reflux disease without esophagitis: Secondary | ICD-10-CM | POA: Diagnosis not present

## 2022-07-06 DIAGNOSIS — I10 Essential (primary) hypertension: Secondary | ICD-10-CM | POA: Diagnosis not present

## 2022-07-06 DIAGNOSIS — Z87891 Personal history of nicotine dependence: Secondary | ICD-10-CM | POA: Diagnosis not present

## 2022-07-06 DIAGNOSIS — C4372 Malignant melanoma of left lower limb, including hip: Secondary | ICD-10-CM | POA: Diagnosis not present

## 2022-07-06 DIAGNOSIS — Z7902 Long term (current) use of antithrombotics/antiplatelets: Secondary | ICD-10-CM | POA: Diagnosis not present

## 2022-07-06 DIAGNOSIS — Z8673 Personal history of transient ischemic attack (TIA), and cerebral infarction without residual deficits: Secondary | ICD-10-CM | POA: Diagnosis not present

## 2022-07-06 DIAGNOSIS — Z483 Aftercare following surgery for neoplasm: Secondary | ICD-10-CM | POA: Diagnosis not present

## 2022-07-06 DIAGNOSIS — Z7982 Long term (current) use of aspirin: Secondary | ICD-10-CM | POA: Diagnosis not present

## 2022-07-07 ENCOUNTER — Encounter: Payer: Self-pay | Admitting: Oncology

## 2022-07-07 ENCOUNTER — Inpatient Hospital Stay: Payer: Medicare HMO | Attending: Oncology | Admitting: Oncology

## 2022-07-07 ENCOUNTER — Inpatient Hospital Stay: Payer: Medicare HMO

## 2022-07-07 VITALS — BP 162/77 | HR 83 | Temp 97.7°F | Resp 18 | Ht 69.69 in | Wt 232.0 lb

## 2022-07-07 DIAGNOSIS — Z5112 Encounter for antineoplastic immunotherapy: Secondary | ICD-10-CM | POA: Insufficient documentation

## 2022-07-07 DIAGNOSIS — C792 Secondary malignant neoplasm of skin: Secondary | ICD-10-CM

## 2022-07-07 DIAGNOSIS — R918 Other nonspecific abnormal finding of lung field: Secondary | ICD-10-CM

## 2022-07-07 DIAGNOSIS — Z79899 Other long term (current) drug therapy: Secondary | ICD-10-CM | POA: Insufficient documentation

## 2022-07-07 DIAGNOSIS — C439 Malignant melanoma of skin, unspecified: Secondary | ICD-10-CM | POA: Diagnosis not present

## 2022-07-07 DIAGNOSIS — C4372 Malignant melanoma of left lower limb, including hip: Secondary | ICD-10-CM | POA: Insufficient documentation

## 2022-07-07 LAB — CMP (CANCER CENTER ONLY)
ALT: 26 U/L (ref 0–44)
AST: 26 U/L (ref 15–41)
Albumin: 3.8 g/dL (ref 3.5–5.0)
Alkaline Phosphatase: 96 U/L (ref 38–126)
Anion gap: 7 (ref 5–15)
BUN: 28 mg/dL — ABNORMAL HIGH (ref 8–23)
CO2: 25 mmol/L (ref 22–32)
Calcium: 8.8 mg/dL — ABNORMAL LOW (ref 8.9–10.3)
Chloride: 106 mmol/L (ref 98–111)
Creatinine: 1.01 mg/dL (ref 0.61–1.24)
GFR, Estimated: >60 mL/min (ref >60)
Glucose, Bld: 124 mg/dL — ABNORMAL HIGH (ref 70–99)
Potassium: 3.9 mmol/L (ref 3.5–5.1)
Sodium: 138 mmol/L (ref 135–145)
Total Bilirubin: 0.8 mg/dL (ref 0.3–1.2)
Total Protein: 7.1 g/dL (ref 6.5–8.1)

## 2022-07-07 LAB — CBC WITH DIFFERENTIAL (CANCER CENTER ONLY)
Abs Immature Granulocytes: 0.05 10*3/uL (ref 0.00–0.07)
Basophils Absolute: 0 10*3/uL (ref 0.0–0.1)
Basophils Relative: 0 %
Eosinophils Absolute: 0.5 10*3/uL (ref 0.0–0.5)
Eosinophils Relative: 3 %
HCT: 41.4 % (ref 39.0–52.0)
Hemoglobin: 13.4 g/dL (ref 13.0–17.0)
Immature Granulocytes: 0 %
Lymphocytes Relative: 21 %
Lymphs Abs: 3 10*3/uL (ref 0.7–4.0)
MCH: 27.6 pg (ref 26.0–34.0)
MCHC: 32.4 g/dL (ref 30.0–36.0)
MCV: 85.2 fL (ref 80.0–100.0)
Monocytes Absolute: 0.9 10*3/uL (ref 0.1–1.0)
Monocytes Relative: 6 %
Neutro Abs: 10 10*3/uL — ABNORMAL HIGH (ref 1.7–7.7)
Neutrophils Relative %: 70 %
Platelet Count: 285 10*3/uL (ref 150–400)
RBC: 4.86 MIL/uL (ref 4.22–5.81)
RDW: 14.2 % (ref 11.5–15.5)
WBC Count: 14.5 10*3/uL — ABNORMAL HIGH (ref 4.0–10.5)
nRBC: 0 % (ref 0.0–0.2)

## 2022-07-07 MED FILL — Nivolumab IV Soln 100 MG/10ML: INTRAVENOUS | Qty: 24 | Status: AC

## 2022-07-07 NOTE — Progress Notes (Signed)
[facility]  [address] [place],  [place]  [address] [phone]  Clinic Day: 07/07/22  Referring physician: Derwood Kaplan, MD   ASSESSMENT & PLAN:   Malignant melanoma of the left foot He has had a punch biopsy and this reveals a Breslow thickness of 1.6 mm and Clark's level 4 with ulceration for a T2b N0 M0 clinically.  We find no distant metastasis on PET scan.  His tumor is positive for BRAF mutation.  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  Multiple lesions of the lung These are suspicious for metastatic disease.  The PET scan is negative but we cannot absolutely rule out metastatic disease since these lesions are small.  Spinal stenosis at L4-5 He has had surgical decompression and resection of a synovial cyst as of January 26 2022.  Leukocytosis He has had a leukocytosis for months but his white count increased to 31,000 with 81% neutrophils last month. He had no symptoms of infection and urinalysis and urine culture were negative.  We did not find an explanation but the WBC's came back down to 12.1, and today are 14.5 with predominantly neutrophils.   He is tolerating the immunotherapy well.  He will proceed with his 5th cycle on 07/10/22. His tumor is positive for BRAF mutation. PDL1 total score was 90%.  His last surgery was 03/24/2022, and he still has a wound that is being followed by the surgeon and the wound center. He has persistent edema of the left leg but has seen gradual improvement of the wound and swelling. I will plan to see him with repeat labs in 2 weeks with CBC, CMP, T4 and TSH. We will then go to 4 week intervals. We will repeat a CT chest, abdomen, pelvis in late December 2023 before his follow up appointment with me on 08/18/22. I discussed the assessment and treatment plan with the patient.  The patient and  his family were provided an opportunity to ask questions and all were answered.  The patient agreed with the plan and demonstrated an understanding of the instructions.  The patient was advised to call back if the symptoms worsen or if the condition fails to improve as anticipated.  No orders of the defined types were placed in this encounter.   I provided 30 minutes of face-to-face time during this this encounter and > 50% was spent counseling as documented under my assessment and plan.    Derwood Kaplan, MD [facility] [address] [place] [address] Dept: [phone] Dept Fax: [phone]   CHIEF COMPLAINT:  CC: Malignant melanoma  Current Treatment: Evaluation and immunotherapy   HISTORY OF PRESENT ILLNESS:  [name] is a 76 y.o. male with a history of malignant melanoma who is referred in consultation with Dr. Bertram Millard for assessment and management.  He had a lesion of the dorsum of the left foot in September 2022 which appeared to be a granuloma and was treated in the office.  He later developed a another lesion lateral to this on the dorsum of the left foot which was not hyperpigmented but did bleed easily.  He also has several lesions of his left medial calf which are nodular and span approximately 6 cm.  He has had some increased edema of the left lower leg.  He was  evaluated by dermatology on June 5, who felt this could be a squamous cell carcinoma versus tinea or a drug reaction.  He had biopsies done of both the foot lesion and the Lesion.  Pathology came back that the left dorsal foot shave biopsy revealed a malignant melanoma possibly a primary nodular ulcerated melanoma which had a Breslow thickness of at least 1.6 mm and a Clark's level 4.  Margins are positive as this was a shave biopsy.  Ulceration is present but no satellitosis.  The mitotic index is 4/mm and no lymphovascular invasion was  identified.  No brisk tumor infiltrating lymphocytes are seen and tumor regression is absent.  This is felt to be at least a T2b lesion and wide excision was recommended.  The lesion of the left medial calf revealed a dermal map melanoma most consistent with metastatic disease.  This information was not available when the patient was admitted for back surgery on June 8 and he had a microlumbar decompression at L4-5 for spinal stenosis.  He was also found to have a synovial cyst at that time which was resected and benign.  He was readmitted several days later for acute pain of the right elbow and both arms.  Aspiration was obtained and this was diagnosed as gouty arthritis.  He did have temporary rehab after his back surgery.  His left leg was evaluated for deep venous thrombosis and was negative.  While he was recuperating from his surgery he had an x-ray which was abnormal which led to a CT scan which revealed at least 3 lung lesions.  MRI of the brain was negative and he is now referred for management of the malignant melanoma.  INTERVAL HISTORY:  I have reviewed his chart and materials related to his cancer extensively and collaborated history with the patient. Summary of oncologic history is as follows: Oncology History  Malignant melanoma, metastatic ([place])  01/23/2022 Cancer Staging   Staging form: Melanoma of the Skin, AJCC 8th Edition - Clinical stage from 01/23/2022: Stage III (cT2b, cN1c, cM0) - Signed by Derwood Kaplan, MD on 04/07/2022 Histopathologic type: Malignant melanoma, NOS (except juvenile melanoma M-8770/[id number]) Stage prefix: Initial diagnosis Laterality: Left Lymph-vascular invasion (LVI): LVI not present (absent)/not identified Diagnostic confirmation: Positive histology Specimen type: Biopsy / Limited Resection Staged by: Managing physician Mitotic count: 4 Mitotic unit: mm2 Clark's level: Level IV Tumor-infiltrating lymphocytes: Present and non-brisk Breslow depth (mm):  16 Ulceration of the epidermis: Yes Microsatellites: No Primary tumor regression: Absent Intransit/satellite metastasis: In-transit Lymph node clinically or radiologically detected: No Microscopic confirmation of metastasis: No Sentinel lymph node biopsy performed: No Completion or therapeutic lymph node dissection performed: No Matted nodes: No Stage used in treatment planning: Yes National guidelines used in treatment planning: Yes Type of national guideline used in treatment planning: NCCN   01/31/2022 Initial Diagnosis   Malignant melanoma, metastatic ([facility])   04/14/2022 -  Chemotherapy   Patient is on Treatment Plan : MELANOMA Nivolumab (480) q28d (changed from q14d on 12/4)     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[place])  02/28/2022 Cancer Staging   Staging form: Melanoma of the Skin, AJCC 8th Edition - Clinical stage from 02/28/2022: Stage IV (cT2b(m), cN1c, cM1a) - Signed by Derwood Kaplan, MD on 04/07/2022 Histopathologic type: Malignant melanoma, NOS (except juvenile melanoma M-8770/0) Stage prefix: Initial diagnosis Laterality: Left Multiple tumors: Yes Lymph-vascular invasion (LVI): LVI present/identified, NOS Diagnostic confirmation: Positive histology Specimen type: Excision Staged by: Managing physician Intransit/satellite metastasis: Both in-transit and  satellite Prognostic indicators: 02/28/22:  Wide excision of primary of foot - no residual melanoma,  Excision of skin of calf consistent with intransit mets of dermal/subcuticular   Melanoma with LVI, metastatic disease with multifocal lesions and multifocal positive margins 03/24/22: Re-excision with a few atypical melanocytes, no melanoma and clear margins   04/07/2022 Initial Diagnosis   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 He states that he is doing well overall. He has now completed his 4 cycles of combined immunotherapy. We will now start maintenance Nivolumab. He would like to go to a 4 week interval. I will see him back this time in 2 weeks with treatment the following week. He will then go to every 4 weeks which brings him back at the end of 08/18/22. That is 6 months from his last scans so I will schedule a CT chest abdomen pelvis 2 days prior to that appointment. He does continue to have a left lower leg wound that is being monitored and treated by wound care. He has seen gradual improvement of the LLE edema and wound. He denies signs of infection such as sore throat, sinus drainage, cough, or urinary symptoms.  He denies fevers or recurrent chills. He denies pain. He denies nausea, vomiting, chest pain, dyspnea or cough. His weight has increased 5 pounds over last 3 weeks .   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 , 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     Past Surgical History:  Procedure Laterality Date   LUMBAR LAMINECTOMY/DECOMPRESSION MICRODISCECTOMY N/A 01/26/2022   Procedure: Microlumbar decompression Lumbar four-five, lateral mass fusion with autograft and allograft bone;  Surgeon: Susa Day, MD;  Location: [facility];  Service: Orthopedics;  Laterality: N/A;   PARTIAL GASTRECTOMY     TOTAL HIP ARTHROPLASTY Right 06/18/2018   Procedure: RIGHT TOTAL HIP ARTHROPLASTY ANTERIOR APPROACH;  Surgeon: [name], MD;  Location: [facility];  Service: Orthopedics;  Laterality: Right;    Family History  Problem Relation  Age of Onset   Melanoma Brother     Social History:  reports that he has quit smoking. He has never used smokeless tobacco. He reports that he does not currently use alcohol. He reports that he does not use drugs.    Allergies:  Allergies  Allergen Reactions   Morphine Itching    Patient states it is tolerable itching    Current Medications: Current Outpatient Medications  Medication Sig Dispense Refill   amLODipine (NORVASC) 5 MG tablet Take 5 mg by mouth daily.     clopidogrel (PLAVIX) 75 MG tablet Take 1 tablet by mouth daily.     docusate sodium (COLACE) 100 MG capsule 1 capsule 2 TIMES DAILY (route: oral)     famotidine (PEPCID) 40 MG tablet Take 1 tablet (  40 mg total) by mouth daily. 30 tablet 5   Flaxseed, Linseed, (FLAXSEED OIL) 1000 MG CAPS Take 1,000 mg by mouth daily.     folic acid (FOLVITE) 203 MCG tablet Take 400 mcg by mouth daily.     gabapentin (NEURONTIN) 300 MG capsule Take 1 capsule (300 mg total) by mouth 3 (three) times daily. 90 capsule 2   hydrochlorothiazide (HYDRODIURIL) 12.5 MG tablet Take 12.5 mg by mouth daily.     lisinopril (ZESTRIL) 20 MG tablet      Multiple Vitamin (MULTIVITAMIN WITH MINERALS) TABS tablet Take 1 tablet by mouth in the morning.     mupirocin ointment (BACTROBAN) 2 % Apply topically.     Omega-3 1000 MG CAPS Take by mouth.     ondansetron (ZOFRAN) 4 MG tablet Take 1 tablet (4 mg total) by mouth every 4 (four) hours as needed for nausea. 90 tablet 3   oxyCODONE (OXY IR/ROXICODONE) 5 MG immediate release tablet Take 1 tablet (5 mg total) by mouth every 6 (six) hours as needed for severe pain. 30 tablet 0   polyethylene glycol (MIRALAX / GLYCOLAX) 17 g packet Take 17 g by mouth daily. 14 each 0   polyethylene glycol powder (GLYCOLAX/MIRALAX) 17 GM/SCOOP powder SMARTSIG:17 Gram(s) By Mouth Daily PRN     pravastatin (PRAVACHOL) 40 MG tablet Take 40 mg by mouth daily.     prochlorperazine (COMPAZINE) 10 MG tablet Take 1 tablet (10 mg total)  by mouth every 6 (six) hours as needed for nausea or vomiting. 90 tablet 3   traZODone (DESYREL) 50 MG tablet Take 50 mg by mouth at bedtime as needed for sleep.     No current facility-administered medications for this visit.    REVIEW OF SYSTEMS:  Review of Systems  Constitutional:  Negative for appetite change, chills, fever and unexpected weight change.  HENT:  Negative.  Negative for lump/mass, mouth sores and sore throat.   Eyes: Negative.   Respiratory: Negative.  Negative for chest tightness, cough, hemoptysis, shortness of breath and wheezing.   Cardiovascular:  Positive for leg swelling (LLE). Negative for chest pain and palpitations.  Gastrointestinal: Negative.  Negative for abdominal distention, abdominal pain, blood in stool, constipation, diarrhea, nausea and vomiting.  Endocrine: Negative.  Negative for hot flashes.  Genitourinary: Negative.  Negative for difficulty urinating, dysuria, frequency and hematuria.   Musculoskeletal:  Negative for arthralgias, back pain, flank pain and gait problem.  Skin:  Positive for wound.  Neurological: Negative.  Negative for dizziness, extremity weakness, gait problem, headaches, light-headedness, numbness, seizures and speech difficulty.  Hematological: Negative.  Negative for adenopathy. Does not bruise/bleed easily.  Psychiatric/Behavioral: Negative.  Negative for depression and sleep disturbance. The patient is not nervous/anxious.       VITALS:  Blood pressure (!) 162/77, pulse 83, temperature 97.7 F (36.5 C), temperature source Oral, resp. rate 18, height 5' 9.69" (1.77 m), weight 232 lb (105.2 kg), SpO2 96 %.  Wt Readings from Last 3 Encounters:  07/24/22 232 lb (105.2 kg)  07/20/22 234 lb (106.1 kg)  07/07/22 232 lb (105.2 kg)    Body mass index is 33.59 kg/m.  Performance status (ECOG): 1 - Symptomatic but completely ambulatory  PHYSICAL EXAM:  Physical Exam Vitals and nursing note reviewed.  Constitutional:       General: He is not in acute distress.    Appearance: Normal appearance. He is normal weight. He is not ill-appearing or toxic-appearing.  HENT:     Head: Normocephalic  and atraumatic.     Mouth/Throat:     Mouth: Mucous membranes are moist.     Pharynx: Oropharynx is clear. No oropharyngeal exudate or posterior oropharyngeal erythema.  Eyes:     General: No scleral icterus.    Extraocular Movements: Extraocular movements intact.     Conjunctiva/sclera: Conjunctivae normal.     Pupils: Pupils are equal, round, and reactive to light.  Cardiovascular:     Rate and Rhythm: Normal rate and regular rhythm.     Pulses: Normal pulses.     Heart sounds: Normal heart sounds. No murmur heard.    No friction rub. No gallop.  Pulmonary:     Effort: Pulmonary effort is normal. No respiratory distress.     Breath sounds: Normal breath sounds. No wheezing, rhonchi or rales.  Abdominal:     General: Bowel sounds are normal. There is no distension.     Palpations: Abdomen is soft. There is no hepatomegaly, splenomegaly or mass.     Tenderness: There is no abdominal tenderness.  Musculoskeletal:        General: No swelling or tenderness. Normal range of motion.     Cervical back: Normal range of motion and neck supple. No tenderness.     Right lower leg: No edema.     Left lower leg: 1+ Edema present.  Lymphadenopathy:     Cervical: No cervical adenopathy.     Upper Body:     Right upper body: No supraclavicular or axillary adenopathy.     Left upper body: No supraclavicular or axillary adenopathy.     Lower Body: No right inguinal adenopathy. No left inguinal adenopathy.  Skin:    General: Skin is warm and dry.     Coloration: Skin is not jaundiced.     Findings: No rash.  Neurological:     Mental Status: He is alert and oriented to person, place, and time.     Cranial Nerves: No cranial nerve deficit.  Psychiatric:        Mood and Affect: Mood normal.        Behavior: Behavior normal.         Thought Content: Thought content normal.     LABS:      Latest Ref Rng & Units 07/20/2022    1:35 PM 07/07/2022    2:27 PM 06/16/2022   12:00 AM  CBC  WBC 4.0 - 10.5 K/uL 12.4  14.5  13.9      Hemoglobin 13.0 - 17.0 g/dL 13.5  13.4  14.0      Hematocrit 39.0 - 52.0 % 41.3  41.4  42      Platelets 150 - 400 K/uL 270  285  250         This result is from an external source.      Latest Ref Rng & Units 07/20/2022    1:35 PM 07/07/2022    2:27 PM 06/16/2022   12:00 AM  CMP  Glucose 70 - 99 mg/dL 131  124    BUN 8 - 23 mg/dL _0 Creatinine 0.61 - 1.24 mg/dL 1.02  1.01  0.9      Sodium 135 - 145 mmol/L 138  138  139      Potassium 3.5 - 5.1 mmol/L 3.7  3.9  3.7      Chloride 98 - 111 mmol/L 104  106  105      CO2 22 -  32 mmol/L _0 Calcium 8.9 - 10.3 mg/dL 8.8  8.8  9.2      Total Protein 6.5 - 8.1 g/dL 6.8  7.1    Total Bilirubin 0.3 - 1.2 mg/dL 0.6  0.8    Alkaline Phos 38 - 126 U/L 85  96  107      AST 15 - 41 U/L _1 ALT 0 - 44 U/L _2 This result is from an external source.     No results found for: "CEA1", "CEA" / No results found for: "CEA1", "CEA" No results found for: "PSA1" No results found for: "AEW257" No results found for: "CAN125"  No results found for: "TOTALPROTELP", "ALBUMINELP", "A1GS", "A2GS", "BETS", "BETA2SER", "GAMS", "MSPIKE", "SPEI" No results found for: "TIBC", "FERRITIN", "IRONPCTSAT" Lab Results  Component Value Date   LDH 160 02/07/2022    STUDIES:  No results found.     EXAM:02/16/2022 NUCLEAR MEDICINE PET WHOLE BODY IMPRESSION: There are 2 small foci of asymmetric focally increased metabolic activity within the left foot which are nonspecific, although may correspond with the patients melanoma or previous surgery related to that. No definite nodal metastases identified in the left lower extremity. No definite evidence of metabolically active metastatic disease. The previously  identified pulmonary nodules are suboptimally evaluated due to motion artifact and size, but do not show significan hypermetabolic activity. However based on distribution, these remain suspicious for metastatic disease and chest CT follow up is recommended Coronary and Aortic Atherosclerosis.    I,Alexis Herring,acting as a scribe for Derwood Kaplan, MD.,have documented all relevant documentation on the behalf of Derwood Kaplan, MD,as directed by  Derwood Kaplan, MD while in the presence of Derwood Kaplan, MD.  I have reviewed this report as typed by the medical scribe, and it is complete and accurate.  Derwood Kaplan   07/25/22 6:37 AM

## 2022-07-08 ENCOUNTER — Other Ambulatory Visit: Payer: Self-pay

## 2022-07-10 ENCOUNTER — Inpatient Hospital Stay: Payer: Medicare HMO

## 2022-07-10 ENCOUNTER — Telehealth: Payer: Self-pay

## 2022-07-10 DIAGNOSIS — C792 Secondary malignant neoplasm of skin: Secondary | ICD-10-CM

## 2022-07-10 DIAGNOSIS — Z5112 Encounter for antineoplastic immunotherapy: Secondary | ICD-10-CM | POA: Diagnosis not present

## 2022-07-10 DIAGNOSIS — C439 Malignant melanoma of skin, unspecified: Secondary | ICD-10-CM

## 2022-07-10 DIAGNOSIS — Z79899 Other long term (current) drug therapy: Secondary | ICD-10-CM | POA: Diagnosis not present

## 2022-07-10 DIAGNOSIS — C4372 Malignant melanoma of left lower limb, including hip: Secondary | ICD-10-CM | POA: Diagnosis not present

## 2022-07-10 MED ORDER — SODIUM CHLORIDE 0.9 % IV SOLN
240.0000 mg | Freq: Once | INTRAVENOUS | Status: AC
  Administered 2022-07-10: 240 mg via INTRAVENOUS
  Filled 2022-07-10: qty 24

## 2022-07-10 MED ORDER — SODIUM CHLORIDE 0.9% FLUSH
10.0000 mL | INTRAVENOUS | Status: DC | PRN
  Administered 2022-07-10: 10 mL

## 2022-07-10 MED ORDER — HEPARIN SOD (PORK) LOCK FLUSH 100 UNIT/ML IV SOLN
500.0000 [IU] | Freq: Once | INTRAVENOUS | Status: AC | PRN
  Administered 2022-07-10: 500 [IU]

## 2022-07-10 MED ORDER — SODIUM CHLORIDE 0.9 % IV SOLN: Freq: Once | INTRAVENOUS | Status: AC

## 2022-07-10 NOTE — Telephone Encounter (Signed)
-----   Message from Derwood Kaplan, MD sent at 07/10/2022  3:15 PM EST ----- Regarding: call Tell him labs look good but he is dehydrated & needs to drink more fluids. WBC's a liitle high as usual

## 2022-07-10 NOTE — Telephone Encounter (Addendum)
-----   Message from Derwood Kaplan, MD sent at 07/10/2022  3:15 PM EST ----- Regarding: call Tell him labs look good but he is dehydrated & needs to drink more fluids. WBC's a liitle high as usual   Patient would like for his scans to be done this year if possible due to already meeting his deductible.

## 2022-07-10 NOTE — Telephone Encounter (Signed)
Patient notified

## 2022-07-10 NOTE — Patient Instructions (Signed)
Nivolumab Injection What is this medication? NIVOLUMAB (nye VOL ue mab) treats some types of cancer. It works by helping your immune system slow or stop the spread of cancer cells. It is a monoclonal antibody. This medicine may be used for other purposes; ask your health care provider or pharmacist if you have questions. COMMON BRAND NAME(S): Opdivo What should I tell my care team before I take this medication? They need to know if you have any of these conditions: Allogeneic stem cell transplant (uses someone else's stem cells) Autoimmune diseases, such as Crohn disease, ulcerative colitis, lupus History of chest radiation Nervous system problems, such as Guillain-Barre syndrome or myasthenia gravis Organ transplant An unusual or allergic reaction to nivolumab, other medications, foods, dyes, or preservatives Pregnant or trying to get pregnant Breast-feeding How should I use this medication? This medication is infused into a vein. It is given in a hospital or clinic setting. A special MedGuide will be given to you before each treatment. Be sure to read this information carefully each time. Talk to your care team about the use of this medication in children. While it may be prescribed for children as young as 12 years for selected conditions, precautions do apply. Overdosage: If you think you have taken too much of this medicine contact a poison control center or emergency room at once. NOTE: This medicine is only for you. Do not share this medicine with others. What if I miss a dose? Keep appointments for follow-up doses. It is important not to miss your dose. Call your care team if you are unable to keep an appointment. What may interact with this medication? Interactions have not been studied. This list may not describe all possible interactions. Give your health care provider a list of all the medicines, herbs, non-prescription drugs, or dietary supplements you use. Also tell them if you  smoke, drink alcohol, or use illegal drugs. Some items may interact with your medicine. What should I watch for while using this medication? Your condition will be monitored carefully while you are receiving this medication. You may need blood work while taking this medication. This medication may cause serious skin reactions. They can happen weeks to months after starting the medication. Contact your care team right away if you notice fevers or flu-like symptoms with a rash. The rash may be red or purple and then turn into blisters or peeling of the skin. You may also notice a red rash with swelling of the face, lips, or lymph nodes in your neck or under your arms. Tell your care team right away if you have any change in your eyesight. Talk to your care team if you are pregnant or think you might be pregnant. A negative pregnancy test is required before starting this medication. A reliable form of contraception is recommended while taking this medication and for 5 months after the last dose. Talk to your care team about effective forms of contraception. Do not breast-feed while taking this medication and for 5 months after the last dose. What side effects may I notice from receiving this medication? Side effects that you should report to your care team as soon as possible: Allergic reactions--skin rash, itching, hives, swelling of the face, lips, tongue, or throat Dry cough, shortness of breath or trouble breathing Eye pain, redness, irritation, or discharge with blurry or decreased vision Heart muscle inflammation--unusual weakness or fatigue, shortness of breath, chest pain, fast or irregular heartbeat, dizziness, swelling of the ankles, feet, or hands Hormone   gland problems--headache, sensitivity to light, unusual weakness or fatigue, dizziness, fast or irregular heartbeat, increased sensitivity to cold or heat, excessive sweating, constipation, hair loss, increased thirst or amount of urine,  tremors or shaking, irritability Infusion reactions--chest pain, shortness of breath or trouble breathing, feeling faint or lightheaded Kidney injury (glomerulonephritis)--decrease in the amount of urine, red or dark brown urine, foamy or bubbly urine, swelling of the ankles, hands, or feet Liver injury--right upper belly pain, loss of appetite, nausea, light-colored stool, dark yellow or brown urine, yellowing skin or eyes, unusual weakness or fatigue Pain, tingling, or numbness in the hands or feet, muscle weakness, change in vision, confusion or trouble speaking, loss of balance or coordination, trouble walking, seizures Rash, fever, and swollen lymph nodes Redness, blistering, peeling, or loosening of the skin, including inside the mouth Sudden or severe stomach pain, bloody diarrhea, fever, nausea, vomiting Side effects that usually do not require medical attention (report these to your care team if they continue or are bothersome): Bone, joint, or muscle pain Diarrhea Fatigue Loss of appetite Nausea Skin rash This list may not describe all possible side effects. Call your doctor for medical advice about side effects. You may report side effects to FDA at 1-800-FDA-1088. Where should I keep my medication? This medication is given in a hospital or clinic. It will not be stored at home. NOTE: This sheet is a summary. It may not cover all possible information. If you have questions about this medicine, talk to your doctor, pharmacist, or health care provider.  2023 Elsevier/Gold Standard (2021-12-05 00:00:00)  

## 2022-07-12 DIAGNOSIS — T8189XA Other complications of procedures, not elsewhere classified, initial encounter: Secondary | ICD-10-CM | POA: Diagnosis not present

## 2022-07-12 DIAGNOSIS — C439 Malignant melanoma of skin, unspecified: Secondary | ICD-10-CM | POA: Diagnosis not present

## 2022-07-14 DIAGNOSIS — Z7902 Long term (current) use of antithrombotics/antiplatelets: Secondary | ICD-10-CM | POA: Diagnosis not present

## 2022-07-14 DIAGNOSIS — Z7982 Long term (current) use of aspirin: Secondary | ICD-10-CM | POA: Diagnosis not present

## 2022-07-14 DIAGNOSIS — C4372 Malignant melanoma of left lower limb, including hip: Secondary | ICD-10-CM | POA: Diagnosis not present

## 2022-07-14 DIAGNOSIS — Z483 Aftercare following surgery for neoplasm: Secondary | ICD-10-CM | POA: Diagnosis not present

## 2022-07-14 DIAGNOSIS — Z87891 Personal history of nicotine dependence: Secondary | ICD-10-CM | POA: Diagnosis not present

## 2022-07-14 DIAGNOSIS — Z8673 Personal history of transient ischemic attack (TIA), and cerebral infarction without residual deficits: Secondary | ICD-10-CM | POA: Diagnosis not present

## 2022-07-14 DIAGNOSIS — K219 Gastro-esophageal reflux disease without esophagitis: Secondary | ICD-10-CM | POA: Diagnosis not present

## 2022-07-14 DIAGNOSIS — I10 Essential (primary) hypertension: Secondary | ICD-10-CM | POA: Diagnosis not present

## 2022-07-18 ENCOUNTER — Encounter: Payer: Self-pay | Admitting: Oncology

## 2022-07-19 DIAGNOSIS — I739 Peripheral vascular disease, unspecified: Secondary | ICD-10-CM | POA: Diagnosis not present

## 2022-07-19 DIAGNOSIS — T8189XA Other complications of procedures, not elsewhere classified, initial encounter: Secondary | ICD-10-CM | POA: Diagnosis not present

## 2022-07-20 ENCOUNTER — Other Ambulatory Visit: Payer: Self-pay | Admitting: Pharmacist

## 2022-07-20 ENCOUNTER — Encounter: Payer: Self-pay | Admitting: Oncology

## 2022-07-20 ENCOUNTER — Other Ambulatory Visit: Payer: Self-pay | Admitting: Oncology

## 2022-07-20 ENCOUNTER — Inpatient Hospital Stay: Payer: Medicare HMO

## 2022-07-20 ENCOUNTER — Inpatient Hospital Stay (INDEPENDENT_AMBULATORY_CARE_PROVIDER_SITE_OTHER): Payer: Medicare HMO | Admitting: Oncology

## 2022-07-20 VITALS — BP 160/72 | HR 72 | Temp 97.4°F | Resp 18 | Ht 69.69 in | Wt 234.0 lb

## 2022-07-20 DIAGNOSIS — Z79899 Other long term (current) drug therapy: Secondary | ICD-10-CM | POA: Diagnosis not present

## 2022-07-20 DIAGNOSIS — C439 Malignant melanoma of skin, unspecified: Secondary | ICD-10-CM | POA: Diagnosis not present

## 2022-07-20 DIAGNOSIS — C792 Secondary malignant neoplasm of skin: Secondary | ICD-10-CM

## 2022-07-20 DIAGNOSIS — C4372 Malignant melanoma of left lower limb, including hip: Secondary | ICD-10-CM | POA: Diagnosis not present

## 2022-07-20 DIAGNOSIS — Z5112 Encounter for antineoplastic immunotherapy: Secondary | ICD-10-CM | POA: Diagnosis not present

## 2022-07-20 LAB — CBC WITH DIFFERENTIAL (CANCER CENTER ONLY)
Abs Immature Granulocytes: 0.05 10*3/uL (ref 0.00–0.07)
Basophils Absolute: 0 10*3/uL (ref 0.0–0.1)
Basophils Relative: 0 %
Eosinophils Absolute: 0.5 10*3/uL (ref 0.0–0.5)
Eosinophils Relative: 4 %
HCT: 41.3 % (ref 39.0–52.0)
Hemoglobin: 13.5 g/dL (ref 13.0–17.0)
Immature Granulocytes: 0 %
Lymphocytes Relative: 29 %
Lymphs Abs: 3.6 10*3/uL (ref 0.7–4.0)
MCH: 28.2 pg (ref 26.0–34.0)
MCHC: 32.7 g/dL (ref 30.0–36.0)
MCV: 86.4 fL (ref 80.0–100.0)
Monocytes Absolute: 1.1 10*3/uL — ABNORMAL HIGH (ref 0.1–1.0)
Monocytes Relative: 9 %
Neutro Abs: 7.2 10*3/uL (ref 1.7–7.7)
Neutrophils Relative %: 58 %
Platelet Count: 270 10*3/uL (ref 150–400)
RBC: 4.78 MIL/uL (ref 4.22–5.81)
RDW: 14.6 % (ref 11.5–15.5)
WBC Count: 12.4 10*3/uL — ABNORMAL HIGH (ref 4.0–10.5)
nRBC: 0 % (ref 0.0–0.2)

## 2022-07-20 LAB — CMP (CANCER CENTER ONLY)
ALT: 21 U/L (ref 0–44)
AST: 22 U/L (ref 15–41)
Albumin: 3.9 g/dL (ref 3.5–5.0)
Alkaline Phosphatase: 85 U/L (ref 38–126)
Anion gap: 6 (ref 5–15)
BUN: 23 mg/dL (ref 8–23)
CO2: 28 mmol/L (ref 22–32)
Calcium: 8.8 mg/dL — ABNORMAL LOW (ref 8.9–10.3)
Chloride: 104 mmol/L (ref 98–111)
Creatinine: 1.02 mg/dL (ref 0.61–1.24)
GFR, Estimated: >60 mL/min (ref >60)
Glucose, Bld: 131 mg/dL — ABNORMAL HIGH (ref 70–99)
Potassium: 3.7 mmol/L (ref 3.5–5.1)
Sodium: 138 mmol/L (ref 135–145)
Total Bilirubin: 0.6 mg/dL (ref 0.3–1.2)
Total Protein: 6.8 g/dL (ref 6.5–8.1)

## 2022-07-20 LAB — TSH: TSH: 1.223 u[IU]/mL (ref 0.350–4.500)

## 2022-07-20 NOTE — Progress Notes (Signed)
[facility]  [address] [place],  [place]  [address] [phone]  Clinic Day: 07/20/22  Referring physician: Consuello Closs, MD   ASSESSMENT & PLAN:   Malignant melanoma of the left foot He has had a punch biopsy and this reveals a Breslow thickness of 1.6 mm and Clark's level 4 with ulceration for a T2b N0 M0 clinically.  We now find no distant metastasis on PET scan.  His tumor is positive for BRAF mutation.  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  Multiple lesions of the lung These are suspicious for metastatic disease.  The PET scan is negative but we cannot absolutely rule out metastatic disease since these lesions are small.  Spinal stenosis at L4-5 He has had surgical decompression and resection of a synovial cyst as of January 26 2022.  Leukocytosis He has had a leukocytosis for months but his white count increased to 31,000 with 81% neutrophils in October 2023. He had no symptoms of infection and urinalysis and urine culture were negative.  We did not find an explanation but the [facility] are back down to 12.1.    Plan:We will now start maintenance Nivolumab, he will now go to every 4 weeks which brings him back at the end of 08/18/22. I will plan to see him with repeat labs in 4 weeks with CBC and CMP.  We will repeat a CT chest, abdomen, pelvis  on 08/16/22. I discussed the assessment and treatment plan with the patient.  The patient and his family were provided an opportunity to ask questions and all were answered.  The patient agreed with the plan and demonstrated an understanding of the instructions.  The patient was advised to call back if the symptoms worsen or if the condition fails to improve as anticipated.     I provided 30 minutes of face-to-face time during this this encounter and > 50% was spent counseling as documented  under my assessment and plan.    Benjamin Kaplan, MD [facility] [address] [place] [address] Dept: [phone] Dept Fax: [phone]   CHIEF COMPLAINT:  CC: Malignant melanoma  Current Treatment: Evaluation and immunotherapy   HISTORY OF PRESENT ILLNESS:  [name] is a 76 y.o. male with a history of malignant melanoma who is referred in consultation with Dr. Bertram Anthony for assessment and management.  He had a lesion of the dorsum of the left foot in September 2022 which appeared to be a granuloma and was treated in the office.  He later developed a another lesion lateral to this on the dorsum of the left foot which was not hyperpigmented but did bleed easily.  He also has several lesions of his left medial calf which are nodular and span approximately 6 cm.  He has had some increased edema of the left lower leg.  He was evaluated by dermatology on June 5, who felt this could be a squamous cell carcinoma versus tinea or a drug reaction.  He had biopsies done of both the foot lesion and the Lesion.  Pathology came back that the left dorsal foot shave biopsy revealed a malignant melanoma possibly a primary nodular ulcerated melanoma which had a Breslow thickness of at least 1.6 mm and a Clark's level 4.  Margins are positive as this was a shave biopsy.  Ulceration is present but no satellitosis.  The mitotic index is 4/mm and no lymphovascular invasion was identified.  No brisk tumor infiltrating lymphocytes are seen and tumor regression is absent.  This is felt to be at least a T2b lesion and wide excision was recommended.  The lesion of the left medial calf revealed a dermal map melanoma most consistent with metastatic disease.  This information was not available when the patient was admitted for back surgery on June 8 and he had a microlumbar decompression at L4-5 for spinal stenosis.  He was also found to have a  synovial cyst at that time which was resected and benign.  He was readmitted several days later for acute pain of the right elbow and both arms.  Aspiration was obtained and this was diagnosed as gouty arthritis.  He did have temporary rehab after his back surgery.  His left leg was evaluated for deep venous thrombosis and was negative.  While he was recuperating from his surgery he had an x-ray which was abnormal which led to a CT scan which revealed at least 3 lung lesions.  MRI of the brain was negative and he is now referred for management of the malignant melanoma.  INTERVAL HISTORY:  I have reviewed his chart and materials related to his cancer extensively and collaborated history with the patient. Summary of oncologic history is as follows: Oncology History  Malignant melanoma, metastatic ([place])  01/23/2022 Cancer Staging   Staging form: Melanoma of the Skin, AJCC 8th Edition - Clinical stage from 01/23/2022: Stage III (cT2b, cN1c, cM0) - Signed by Benjamin Kaplan, MD on 04/07/2022 Histopathologic type: Malignant melanoma, NOS (except juvenile melanoma M-8770/[id number]) Stage prefix: Initial diagnosis Laterality: Left Lymph-vascular invasion (LVI): LVI not present (absent)/not identified Diagnostic confirmation: Positive histology Specimen type: Biopsy / Limited Resection Staged by: Managing physician Mitotic count: 4 Mitotic unit: mm2 Clark's level: Level IV Tumor-infiltrating lymphocytes: Present and non-brisk Breslow depth (mm): 16 Ulceration of the epidermis: Yes Microsatellites: No Primary tumor regression: Absent Intransit/satellite metastasis: In-transit Lymph node clinically or radiologically detected: No Microscopic confirmation of metastasis: No Sentinel lymph node biopsy performed: No Completion or therapeutic lymph node dissection performed: No Matted nodes: No Stage used in treatment planning: Yes National guidelines used in treatment planning: Yes Type of national  guideline used in treatment planning: NCCN   01/31/2022 Initial Diagnosis   Malignant melanoma, metastatic ([place])   04/14/2022 -  Chemotherapy   Patient is on Treatment Plan : MELANOMA Nivolumab (480) q28d (changed from q14d on 12/4)     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[place])  02/28/2022 Cancer Staging   Staging form: Melanoma of the Skin, AJCC 8th Edition - Clinical stage from 02/28/2022: Stage IV (cT2b(m), cN1c, cM1a) - Signed by Benjamin Kaplan, MD on 04/07/2022 Histopathologic type: Malignant melanoma, NOS (except juvenile melanoma M-8770/[id number]) Stage prefix: Initial diagnosis Laterality: Left Multiple tumors: Yes Lymph-vascular invasion (LVI): LVI present/identified, NOS Diagnostic confirmation: Positive histology Specimen type: Excision Staged by: Managing physician Intransit/satellite metastasis: Both in-transit and satellite Prognostic indicators: 02/28/22:  Wide excision of primary of foot - no residual melanoma,  Excision of skin of calf consistent with intransit mets of dermal/subcuticular   Melanoma with LVI, metastatic disease with multifocal lesions and multifocal positive margins 03/24/22: Re-excision with a few atypical melanocytes, no melanoma and clear margins   04/07/2022 Initial Diagnosis   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 He states that he is doing well overall. He has now completed his 4 cycles of combined immunotherapy. We will now start maintenance Nivolumab. He would like to go to a 4 week interval. He will now go to every 4 weeks which brings him back at the end of 08/18/22. That is 6 months from his last scans so I will schedule a CT chest abdomen pelvis 2 days prior to that appointment. He states his wound in the left upper leg is healing  well, he has visited the wound center.  He denies signs of infection such as sore throat, sinus drainage, cough, or urinary symptoms.  He denies fevers or recurrent chills. He denies pain. He denies nausea, vomiting, chest pain, dyspnea or cough. His weight increased 2 pounds since his last visit.    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 , 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     Past Surgical History:  Procedure Laterality Date   LUMBAR LAMINECTOMY/DECOMPRESSION MICRODISCECTOMY N/A 01/26/2022   Procedure: Microlumbar decompression Lumbar four-five, lateral mass fusion with autograft and allograft bone;  Surgeon: Susa Day, MD;  Location: [facility];  Service: Orthopedics;  Laterality: N/A;   PARTIAL GASTRECTOMY     TOTAL HIP ARTHROPLASTY Right 06/18/2018   Procedure: RIGHT TOTAL HIP ARTHROPLASTY ANTERIOR APPROACH;  Surgeon: [name], MD;  Location: [facility];  Service: Orthopedics;  Laterality: Right;    Family History  Problem Relation Age of Onset   Melanoma Brother     Social History:  reports that he has quit smoking. He has never used smokeless tobacco. He reports that he does not currently use alcohol. He reports that he does not use drugs.    Allergies:  Allergies  Allergen Reactions   Morphine Itching    Patient states it is tolerable itching    Current Medications: Current Outpatient Medications  Medication Sig Dispense Refill   amLODipine (NORVASC) 5 MG tablet Take 5 mg by mouth daily.     clopidogrel (PLAVIX) 75 MG tablet Take 1 tablet by mouth daily.     docusate sodium (COLACE) 100 MG capsule 1 capsule 2  TIMES DAILY (route: oral)     famotidine (PEPCID) 40 MG tablet Take 1 tablet (40 mg total) by mouth daily. 30 tablet 5   Flaxseed, Linseed, (FLAXSEED OIL) 1000 MG CAPS Take 1,000 mg by mouth daily.     folic acid (FOLVITE) 569 MCG tablet Take 400 mcg by mouth daily.     gabapentin (NEURONTIN) 300 MG capsule Take 1 capsule (300 mg total) by mouth 3 (three) times daily. 90 capsule 2   hydrochlorothiazide (HYDRODIURIL) 12.5 MG tablet Take 12.5 mg by mouth daily.     lisinopril (ZESTRIL) 20 MG tablet      Multiple Vitamin (MULTIVITAMIN WITH MINERALS) TABS tablet Take 1 tablet by mouth in the morning.  mupirocin ointment (BACTROBAN) 2 % Apply topically.     Omega-3 1000 MG CAPS Take by mouth.     ondansetron (ZOFRAN) 4 MG tablet Take 1 tablet (4 mg total) by mouth every 4 (four) hours as needed for nausea. 90 tablet 3   oxyCODONE (OXY IR/ROXICODONE) 5 MG immediate release tablet Take 1 tablet (5 mg total) by mouth every 6 (six) hours as needed for severe pain. 30 tablet 0   polyethylene glycol (MIRALAX / GLYCOLAX) 17 g packet Take 17 g by mouth daily. 14 each 0   polyethylene glycol powder (GLYCOLAX/MIRALAX) 17 GM/SCOOP powder SMARTSIG:17 Gram(s) By Mouth Daily PRN     pravastatin (PRAVACHOL) 40 MG tablet Take 40 mg by mouth daily.     prochlorperazine (COMPAZINE) 10 MG tablet Take 1 tablet (10 mg total) by mouth every 6 (six) hours as needed for nausea or vomiting. 90 tablet 3   traZODone (DESYREL) 50 MG tablet Take 50 mg by mouth at bedtime as needed for sleep.     No current facility-administered medications for this visit.    REVIEW OF SYSTEMS:  Review of Systems  Constitutional:  Negative for appetite change, chills, fever and unexpected weight change.  HENT:  Negative.  Negative for lump/mass, mouth sores and sore throat.   Eyes: Negative.   Respiratory: Negative.  Negative for chest tightness, cough, hemoptysis, shortness of breath and wheezing.   Cardiovascular:  Positive for leg  swelling (LLE). Negative for chest pain and palpitations.  Gastrointestinal: Negative.  Negative for abdominal distention, abdominal pain, blood in stool, constipation, diarrhea, nausea and vomiting.  Endocrine: Negative.  Negative for hot flashes.  Genitourinary: Negative.  Negative for difficulty urinating, dysuria, frequency and hematuria.   Musculoskeletal:  Negative for arthralgias, back pain, flank pain and gait problem.  Skin:  Positive for wound.  Neurological: Negative.  Negative for dizziness, extremity weakness, gait problem, headaches, light-headedness, numbness, seizures and speech difficulty.  Hematological: Negative.  Negative for adenopathy. Does not bruise/bleed easily.  Psychiatric/Behavioral: Negative.  Negative for depression and sleep disturbance. The patient is not nervous/anxious.       VITALS:  Blood pressure (!) 160/72, pulse 72, temperature (!) 97.4 F (36.3 C), temperature source Oral, resp. rate 18, height 5' 9.69" (1.77 m), weight 234 lb (106.1 kg), SpO2 96 %.  Wt Readings from Last 3 Encounters:  07/24/22 232 lb (105.2 kg)  07/20/22 234 lb (106.1 kg)  07/07/22 232 lb (105.2 kg)    Body mass index is 33.87 kg/m.  Performance status (ECOG): 1 - Symptomatic but completely ambulatory  PHYSICAL EXAM:  Physical Exam Vitals and nursing note reviewed.  Constitutional:      General: He is not in acute distress.    Appearance: Normal appearance. He is normal weight. He is not ill-appearing or toxic-appearing.  HENT:     Head: Normocephalic and atraumatic.     Mouth/Throat:     Mouth: Mucous membranes are moist.     Pharynx: Oropharynx is clear. No oropharyngeal exudate or posterior oropharyngeal erythema.  Eyes:     General: No scleral icterus.    Extraocular Movements: Extraocular movements intact.     Conjunctiva/sclera: Conjunctivae normal.     Pupils: Pupils are equal, round, and reactive to light.  Cardiovascular:     Rate and Rhythm: Normal rate and  regular rhythm.     Pulses: Normal pulses.     Heart sounds: Normal heart sounds. No murmur heard.    No friction rub. No  gallop.  Pulmonary:     Effort: Pulmonary effort is normal. No respiratory distress.     Breath sounds: Normal breath sounds. No wheezing, rhonchi or rales.  Abdominal:     General: Bowel sounds are normal. There is no distension.     Palpations: Abdomen is soft. There is no hepatomegaly, splenomegaly or mass.     Tenderness: There is no abdominal tenderness.  Musculoskeletal:        General: No swelling or tenderness. Normal range of motion.     Cervical back: Normal range of motion and neck supple. No tenderness.     Right lower leg: No edema.     Left lower leg: 1+ Edema present.     Comments: 2+ edema of the left leg, trace on the right. Large wound o f the left leg. Healing wound measuring 7-8 cm across with a lot of keloid granulation tissue. Healing well. No sign of recurrence.    Lymphadenopathy:     Cervical: No cervical adenopathy.     Upper Body:     Right upper body: No supraclavicular or axillary adenopathy.     Left upper body: No supraclavicular or axillary adenopathy.     Lower Body: No right inguinal adenopathy. No left inguinal adenopathy.  Skin:    General: Skin is warm and dry.     Coloration: Skin is not jaundiced.     Findings: No rash.  Neurological:     Mental Status: He is alert and oriented to person, place, and time.     Cranial Nerves: No cranial nerve deficit.  Psychiatric:        Mood and Affect: Mood normal.        Behavior: Behavior normal.        Thought Content: Thought content normal.     LABS:      Latest Ref Rng & Units 07/20/2022    1:35 PM 07/07/2022    2:27 PM 06/16/2022   12:00 AM  CBC  WBC 4.0 - 10.5 K/uL 12.4  14.5  13.9      Hemoglobin 13.0 - 17.0 g/dL 13.5  13.4  14.0      Hematocrit 39.0 - 52.0 % 41.3  41.4  42      Platelets 150 - 400 K/uL 270  285  250         This result is from an external source.       Latest Ref Rng & Units 07/20/2022    1:35 PM 07/07/2022    2:27 PM 06/16/2022   12:00 AM  CMP  Glucose 70 - 99 mg/dL 131  124    BUN 8 - 23 mg/dL _0 Creatinine 0.61 - 1.24 mg/dL 1.02  1.01  0.9      Sodium 135 - 145 mmol/L 138  138  139      Potassium 3.5 - 5.1 mmol/L 3.7  3.9  3.7      Chloride 98 - 111 mmol/L 104  106  105      CO2 22 - 32 mmol/L _1 Calcium 8.9 - 10.3 mg/dL 8.8  8.8  9.2      Total Protein 6.5 - 8.1 g/dL 6.8  7.1    Total Bilirubin 0.3 - 1.2 mg/dL 0.6  0.8    Alkaline Phos 38 - 126 U/L 85  96  107  AST 15 - 41 U/L _0 ALT 0 - 44 U/L _1 This result is from an external source.     No results found for: "CEA1", "CEA" / No results found for: "CEA1", "CEA" No results found for: "PSA1" No results found for: "FEO712" No results found for: "CAN125"  No results found for: "TOTALPROTELP", "ALBUMINELP", "A1GS", "A2GS", "BETS", "BETA2SER", "GAMS", "MSPIKE", "SPEI" No results found for: "TIBC", "FERRITIN", "IRONPCTSAT" Lab Results  Component Value Date   LDH 160 02/07/2022    STUDIES:  No results found.     EXAM:02/16/2022 NUCLEAR MEDICINE PET WHOLE BODY IMPRESSION: There are 2 small foci of asymmetric focally increased metabolic activity within the left foot which are nonspecific, although may correspond with the patients melanoma or previous surgery related to that. No definite nodal metastases identified in the left lower extremity. No definite evidence of metabolically active metastatic disease. The previously identified pulmonary nodules are suboptimally evaluated due to motion artifact and size, but do not show significan hypermetabolic activity. However based on distribution, these remain suspicious for metastatic disease and chest CT follow up is recommended Coronary and Aortic Atherosclerosis.     I,Gabriella Ballesteros,acting as a scribe for Benjamin Kaplan, MD.,have documented all  relevant documentation on the behalf of Benjamin Kaplan, MD,as directed by  Benjamin Kaplan, MD while in the presence of Benjamin Kaplan, MD.   I have reviewed this report as typed by the medical scribe, and it is complete and accurate.  [name]   08/15/22 7:27 AM

## 2022-07-20 NOTE — Progress Notes (Signed)
Change nivolumab to 480 mg every 28 days starting 12/4 per Dr. Hinton Rao

## 2022-07-21 ENCOUNTER — Other Ambulatory Visit: Payer: Self-pay

## 2022-07-21 MED FILL — Nivolumab IV Soln 240 MG/24ML: INTRAVENOUS | Qty: 48 | Status: AC

## 2022-07-22 LAB — T4: T4, Total: 7.4 ug/dL (ref 4.5–12.0)

## 2022-07-24 ENCOUNTER — Inpatient Hospital Stay: Payer: Medicare HMO | Attending: Oncology

## 2022-07-24 VITALS — BP 149/79 | HR 77 | Temp 98.0°F | Resp 18 | Ht 69.69 in | Wt 232.0 lb

## 2022-07-24 DIAGNOSIS — C4372 Malignant melanoma of left lower limb, including hip: Secondary | ICD-10-CM | POA: Diagnosis not present

## 2022-07-24 DIAGNOSIS — C439 Malignant melanoma of skin, unspecified: Secondary | ICD-10-CM

## 2022-07-24 DIAGNOSIS — C792 Secondary malignant neoplasm of skin: Secondary | ICD-10-CM

## 2022-07-24 DIAGNOSIS — Z5112 Encounter for antineoplastic immunotherapy: Secondary | ICD-10-CM | POA: Diagnosis not present

## 2022-07-24 DIAGNOSIS — Z79899 Other long term (current) drug therapy: Secondary | ICD-10-CM | POA: Diagnosis not present

## 2022-07-24 MED ORDER — SODIUM CHLORIDE 0.9% FLUSH
10.0000 mL | INTRAVENOUS | Status: DC | PRN
  Administered 2022-07-24: 10 mL

## 2022-07-24 MED ORDER — HEPARIN SOD (PORK) LOCK FLUSH 100 UNIT/ML IV SOLN
500.0000 [IU] | Freq: Once | INTRAVENOUS | Status: AC | PRN
  Administered 2022-07-24: 500 [IU]

## 2022-07-24 MED ORDER — SODIUM CHLORIDE 0.9 % IV SOLN: Freq: Once | INTRAVENOUS | Status: AC

## 2022-07-24 MED ORDER — SODIUM CHLORIDE 0.9 % IV SOLN
480.0000 mg | Freq: Once | INTRAVENOUS | Status: AC
  Administered 2022-07-24: 480 mg via INTRAVENOUS
  Filled 2022-07-24: qty 48

## 2022-07-24 NOTE — Patient Instructions (Signed)
Nivolumab Injection What is this medication? NIVOLUMAB (nye VOL ue mab) treats some types of cancer. It works by helping your immune system slow or stop the spread of cancer cells. It is a monoclonal antibody. This medicine may be used for other purposes; ask your health care provider or pharmacist if you have questions. COMMON BRAND NAME(S): Opdivo What should I tell my care team before I take this medication? They need to know if you have any of these conditions: Allogeneic stem cell transplant (uses someone else's stem cells) Autoimmune diseases, such as Crohn disease, ulcerative colitis, lupus History of chest radiation Nervous system problems, such as Guillain-Barre syndrome or myasthenia gravis Organ transplant An unusual or allergic reaction to nivolumab, other medications, foods, dyes, or preservatives Pregnant or trying to get pregnant Breast-feeding How should I use this medication? This medication is infused into a vein. It is given in a hospital or clinic setting. A special MedGuide will be given to you before each treatment. Be sure to read this information carefully each time. Talk to your care team about the use of this medication in children. While it may be prescribed for children as young as 12 years for selected conditions, precautions do apply. Overdosage: If you think you have taken too much of this medicine contact a poison control center or emergency room at once. NOTE: This medicine is only for you. Do not share this medicine with others. What if I miss a dose? Keep appointments for follow-up doses. It is important not to miss your dose. Call your care team if you are unable to keep an appointment. What may interact with this medication? Interactions have not been studied. This list may not describe all possible interactions. Give your health care provider a list of all the medicines, herbs, non-prescription drugs, or dietary supplements you use. Also tell them if you  smoke, drink alcohol, or use illegal drugs. Some items may interact with your medicine. What should I watch for while using this medication? Your condition will be monitored carefully while you are receiving this medication. You may need blood work while taking this medication. This medication may cause serious skin reactions. They can happen weeks to months after starting the medication. Contact your care team right away if you notice fevers or flu-like symptoms with a rash. The rash may be red or purple and then turn into blisters or peeling of the skin. You may also notice a red rash with swelling of the face, lips, or lymph nodes in your neck or under your arms. Tell your care team right away if you have any change in your eyesight. Talk to your care team if you are pregnant or think you might be pregnant. A negative pregnancy test is required before starting this medication. A reliable form of contraception is recommended while taking this medication and for 5 months after the last dose. Talk to your care team about effective forms of contraception. Do not breast-feed while taking this medication and for 5 months after the last dose. What side effects may I notice from receiving this medication? Side effects that you should report to your care team as soon as possible: Allergic reactions--skin rash, itching, hives, swelling of the face, lips, tongue, or throat Dry cough, shortness of breath or trouble breathing Eye pain, redness, irritation, or discharge with blurry or decreased vision Heart muscle inflammation--unusual weakness or fatigue, shortness of breath, chest pain, fast or irregular heartbeat, dizziness, swelling of the ankles, feet, or hands Hormone   gland problems--headache, sensitivity to light, unusual weakness or fatigue, dizziness, fast or irregular heartbeat, increased sensitivity to cold or heat, excessive sweating, constipation, hair loss, increased thirst or amount of urine,  tremors or shaking, irritability Infusion reactions--chest pain, shortness of breath or trouble breathing, feeling faint or lightheaded Kidney injury (glomerulonephritis)--decrease in the amount of urine, red or dark brown urine, foamy or bubbly urine, swelling of the ankles, hands, or feet Liver injury--right upper belly pain, loss of appetite, nausea, light-colored stool, dark yellow or brown urine, yellowing skin or eyes, unusual weakness or fatigue Pain, tingling, or numbness in the hands or feet, muscle weakness, change in vision, confusion or trouble speaking, loss of balance or coordination, trouble walking, seizures Rash, fever, and swollen lymph nodes Redness, blistering, peeling, or loosening of the skin, including inside the mouth Sudden or severe stomach pain, bloody diarrhea, fever, nausea, vomiting Side effects that usually do not require medical attention (report these to your care team if they continue or are bothersome): Bone, joint, or muscle pain Diarrhea Fatigue Loss of appetite Nausea Skin rash This list may not describe all possible side effects. Call your doctor for medical advice about side effects. You may report side effects to FDA at 1-800-FDA-1088. Where should I keep my medication? This medication is given in a hospital or clinic. It will not be stored at home. NOTE: This sheet is a summary. It may not cover all possible information. If you have questions about this medicine, talk to your doctor, pharmacist, or health care provider.  2023 Elsevier/Gold Standard (2021-12-05 00:00:00)  

## 2022-07-25 ENCOUNTER — Encounter: Payer: Self-pay | Admitting: Oncology

## 2022-07-25 ENCOUNTER — Telehealth: Payer: Self-pay | Admitting: Oncology

## 2022-07-25 NOTE — Telephone Encounter (Signed)
CT C/A/P has been scheduled for 08/16/22 @ 11:30 am; Check in @ 10:30   LVM notifying pt of date,time and instructions.

## 2022-07-26 DIAGNOSIS — C439 Malignant melanoma of skin, unspecified: Secondary | ICD-10-CM | POA: Diagnosis not present

## 2022-07-26 DIAGNOSIS — T8189XA Other complications of procedures, not elsewhere classified, initial encounter: Secondary | ICD-10-CM | POA: Diagnosis not present

## 2022-07-27 DIAGNOSIS — C439 Malignant melanoma of skin, unspecified: Secondary | ICD-10-CM | POA: Diagnosis not present

## 2022-08-07 ENCOUNTER — Ambulatory Visit: Payer: Medicare HMO

## 2022-08-10 DIAGNOSIS — J40 Bronchitis, not specified as acute or chronic: Secondary | ICD-10-CM | POA: Diagnosis not present

## 2022-08-10 DIAGNOSIS — J329 Chronic sinusitis, unspecified: Secondary | ICD-10-CM | POA: Diagnosis not present

## 2022-08-10 DIAGNOSIS — Z6832 Body mass index (BMI) 32.0-32.9, adult: Secondary | ICD-10-CM | POA: Diagnosis not present

## 2022-08-10 DIAGNOSIS — R062 Wheezing: Secondary | ICD-10-CM | POA: Diagnosis not present

## 2022-08-15 ENCOUNTER — Encounter: Payer: Self-pay | Admitting: Oncology

## 2022-08-16 DIAGNOSIS — J929 Pleural plaque without asbestos: Secondary | ICD-10-CM | POA: Diagnosis not present

## 2022-08-16 DIAGNOSIS — N281 Cyst of kidney, acquired: Secondary | ICD-10-CM | POA: Diagnosis not present

## 2022-08-16 DIAGNOSIS — J849 Interstitial pulmonary disease, unspecified: Secondary | ICD-10-CM | POA: Diagnosis not present

## 2022-08-16 DIAGNOSIS — R918 Other nonspecific abnormal finding of lung field: Secondary | ICD-10-CM | POA: Diagnosis not present

## 2022-08-16 DIAGNOSIS — C801 Malignant (primary) neoplasm, unspecified: Secondary | ICD-10-CM | POA: Diagnosis not present

## 2022-08-16 DIAGNOSIS — C792 Secondary malignant neoplasm of skin: Secondary | ICD-10-CM | POA: Diagnosis not present

## 2022-08-16 DIAGNOSIS — C439 Malignant melanoma of skin, unspecified: Secondary | ICD-10-CM | POA: Diagnosis not present

## 2022-08-16 DIAGNOSIS — Z96641 Presence of right artificial hip joint: Secondary | ICD-10-CM | POA: Diagnosis not present

## 2022-08-16 LAB — HEPATIC FUNCTION PANEL
ALT: 27 U/L (ref 10–40)
AST: 32 (ref 14–40)
Alkaline Phosphatase: 108 (ref 25–125)
Bilirubin, Total: 0.9

## 2022-08-16 LAB — BASIC METABOLIC PANEL
BUN: 23 — AB (ref 4–21)
CO2: 26 — AB (ref 13–22)
Chloride: 102 (ref 99–108)
Creatinine: 0.8 (ref 0.6–1.3)
Glucose: 104
Potassium: 4.1 mEq/L (ref 3.5–5.1)
Sodium: 139 (ref 137–147)

## 2022-08-16 LAB — COMPREHENSIVE METABOLIC PANEL
Albumin: 3.9 (ref 3.5–5.0)
Calcium: 8.7 (ref 8.7–10.7)

## 2022-08-16 LAB — CBC AND DIFFERENTIAL
HCT: 41 (ref 41–53)
Hemoglobin: 13.9 (ref 13.5–17.5)
Neutrophils Absolute: 10.18
Platelets: 273 10*3/uL (ref 150–400)
WBC: 15.9

## 2022-08-16 LAB — CBC: RBC: 4.93 (ref 3.87–5.11)

## 2022-08-18 ENCOUNTER — Other Ambulatory Visit: Payer: Self-pay | Admitting: Oncology

## 2022-08-18 ENCOUNTER — Encounter: Payer: Self-pay | Admitting: Oncology

## 2022-08-18 ENCOUNTER — Inpatient Hospital Stay (INDEPENDENT_AMBULATORY_CARE_PROVIDER_SITE_OTHER): Payer: Medicare HMO | Admitting: Oncology

## 2022-08-18 VITALS — BP 164/70 | HR 79 | Temp 97.8°F | Resp 18 | Ht 69.69 in | Wt 234.9 lb

## 2022-08-18 DIAGNOSIS — C439 Malignant melanoma of skin, unspecified: Secondary | ICD-10-CM

## 2022-08-18 DIAGNOSIS — C792 Secondary malignant neoplasm of skin: Secondary | ICD-10-CM

## 2022-08-18 DIAGNOSIS — R918 Other nonspecific abnormal finding of lung field: Secondary | ICD-10-CM | POA: Diagnosis not present

## 2022-08-18 MED FILL — Nivolumab IV Soln 240 MG/24ML: INTRAVENOUS | Qty: 48 | Status: AC

## 2022-08-18 NOTE — Progress Notes (Addendum)
[place]  [address] [facility] [place],  [place]  [address] [phone]  Clinic Day: 08/18/22   Referring physician: Consuello Closs, MD   ASSESSMENT & PLAN:   Malignant melanoma of the left foot He had a punch biopsy and this reveals a Breslow thickness of 1.6 mm and Clark's level 4 with ulceration for a T2b N0 M0 clinically.  We found no distant metastasis on PET scan.  His tumor is positive for BRAF mutation.  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  Multiple lesions of the lung These were suspicious for metastatic disease but have now resolved on the CT scan on 08/12/22.  The PET scan was negative but we cannot absolutely rule out metastatic disease since these lesions are small. We discussed the fact that the resolution of these lesions could represent a response to treatment, or may not have been malignant at all. This would affect the duration of his immunotherapy.   Spinal stenosis at L4-5 He has had surgical decompression and resection of a synovial cyst as of January 26 2022.  Leukocytosis He has had a leukocytosis for months but his white count increased to 31,000 with 81% neutrophils in October 2023. He had no symptoms of infection and urinalysis and urine culture were negative.  We did not find an explanation but the [facility] were back down to 12.1, and today is 15.9 with normal differential. He is recovering from the flu.   Plan: We will proceed with his treatment next week unless he has worsening of his respiratory status. He will be seen 4 weeks from now with CBC and CMP and have his infusion the following Tuesday. The patient and his family were provided an opportunity to ask questions and all were answered.  The patient agreed with the plan and demonstrated an understanding of the instructions.  The patient was advised to call back if  the symptoms worsen or if the condition fails to improve as anticipated.    I provided 30 minutes of face-to-face time during this this encounter and > 50% was spent counseling as documented under my assessment and plan.    Derwood Kaplan, MD [facility] [address] [place] [address] Dept: [phone] Dept Fax: [phone]   CHIEF COMPLAINT:  CC: Malignant melanoma  Current Treatment: Evaluation and immunotherapy   HISTORY OF PRESENT ILLNESS:  [name] is a 76 y.o. male with a history of malignant melanoma who is referred in consultation with Dr. Bertram Millard for assessment and management.  He had a lesion of the dorsum of the left foot in September 2022 which appeared to be a granuloma and was treated in the office.  He later developed a another lesion lateral to this on the dorsum of the left foot which was not hyperpigmented but did bleed easily.  He also has several lesions of his left medial calf which are nodular and span approximately 6 cm.  He has had some increased edema of the left lower leg.  He was evaluated by dermatology on June 5, who felt this could be a squamous cell carcinoma versus tinea or a drug reaction.  He had biopsies done of both the foot lesion and the Lesion.  Pathology came back that the left dorsal foot shave biopsy revealed a malignant melanoma possibly a primary nodular  ulcerated melanoma which had a Breslow thickness of at least 1.6 mm and a Clark's level 4.  Margins are positive as this was a shave biopsy.  Ulceration is present but no satellitosis.  The mitotic index is 4/mm and no lymphovascular invasion was identified.  No brisk tumor infiltrating lymphocytes are seen and tumor regression is absent.  This is felt to be at least a T2b lesion and wide excision was recommended.  The lesion of the left medial calf revealed a dermal map melanoma most consistent with metastatic  disease.  This information was not available when the patient was admitted for back surgery on June 8 and he had a microlumbar decompression at L4-5 for spinal stenosis.  He was also found to have a synovial cyst at that time which was resected and benign.  He was readmitted several days later for acute pain of the right elbow and both arms.  Aspiration was obtained and this was diagnosed as gouty arthritis.  He did have temporary rehab after his back surgery.  His left leg was evaluated for deep venous thrombosis and was negative.  While he was recuperating from his surgery he had an x-ray which was abnormal which led to a CT scan which revealed at least 3 lung lesions.  MRI of the brain was negative and he is now referred for management of the malignant melanoma.  Oncology History  Malignant melanoma, metastatic ([place])  01/23/2022 Cancer Staging   Staging form: Melanoma of the Skin, AJCC 8th Edition - Clinical stage from 01/23/2022: Stage III (cT2b, cN1c, cM0) - Signed by Derwood Kaplan, MD on 04/07/2022 Histopathologic type: Malignant melanoma, NOS (except juvenile melanoma M-8770/[id number]) Stage prefix: Initial diagnosis Laterality: Left Lymph-vascular invasion (LVI): LVI not present (absent)/not identified Diagnostic confirmation: Positive histology Specimen type: Biopsy / Limited Resection Staged by: Managing physician Mitotic count: 4 Mitotic unit: mm2 Clark's level: Level IV Tumor-infiltrating lymphocytes: Present and non-brisk Breslow depth (mm): 16 Ulceration of the epidermis: Yes Microsatellites: No Primary tumor regression: Absent Intransit/satellite metastasis: In-transit Lymph node clinically or radiologically detected: No Microscopic confirmation of metastasis: No Sentinel lymph node biopsy performed: No Completion or therapeutic lymph node dissection performed: No Matted nodes: No Stage used in treatment planning: Yes National guidelines used in treatment planning: Yes Type  of national guideline used in treatment planning: NCCN   01/31/2022 Initial Diagnosis   Malignant melanoma, metastatic ([place])   04/14/2022 -  Chemotherapy   Patient is on Treatment Plan : MELANOMA Nivolumab (480) q28d (changed from q14d on 12/4)     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[place])  02/28/2022 Cancer Staging   Staging form: Melanoma of the Skin, AJCC 8th Edition - Clinical stage from 02/28/2022: Stage IV (cT2b(m), cN1c, cM1a) - Signed by Derwood Kaplan, MD on 04/07/2022 Histopathologic type: Malignant melanoma, NOS (except juvenile melanoma M-8770/[id number]) Stage prefix: Initial diagnosis Laterality: Left Multiple tumors: Yes Lymph-vascular invasion (LVI): LVI present/identified, NOS Diagnostic confirmation: Positive histology Specimen type: Excision Staged by: Managing physician Intransit/satellite metastasis: Both in-transit and satellite Prognostic indicators: 02/28/22:  Wide excision of primary of foot - no residual melanoma,  Excision of skin of calf consistent with intransit mets of dermal/subcuticular   Melanoma with LVI, metastatic disease with multifocal lesions and multifocal positive margins 03/24/22: Re-excision with a few atypical melanocytes, no melanoma and clear margins   04/07/2022 Initial Diagnosis   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 States that he is feeling ok but he tested positive for the flu 08/10/22 and is recovering from it but only has a cough and congestion with clear phlegm. I advised him to keep monitoring the color of the phlegm and his temperature. The CT scan shows the nodules of the right upper lobe and right middle lobe have resolved. However, he now has an area of consolidation in the right middle lobe which is new and consistent  with infection or inflammation. I believe he was starting to develop a bit of pneumonia from his influenza. He will be seen 4 weeks from now with CBC and CMP and have his infusion the following Tuesday. I plan to repeat his scan in 6 months. His chemistries are normal and his white counts are still elevated same as previous tests. His medial left lower leg wound is healing nicely. He denies signs of infection such as sore throat, sinus drainage or urinary symptoms.  He denies fevers or recurrent chills. He denies pain. He denies nausea, vomiting, chest pain, dyspnea or cough. His weight has increased 2 pounds over last month .   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 , 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     Past Surgical History:  Procedure Laterality Date   LUMBAR LAMINECTOMY/DECOMPRESSION MICRODISCECTOMY N/A 01/26/2022   Procedure: Microlumbar decompression Lumbar four-five, lateral mass fusion with autograft and allograft bone;  Surgeon: Susa Day, MD;  Location: [facility];  Service: Orthopedics;  Laterality: N/A;   PARTIAL GASTRECTOMY     TOTAL HIP ARTHROPLASTY Right 06/18/2018   Procedure: RIGHT TOTAL HIP ARTHROPLASTY ANTERIOR APPROACH;  Surgeon: [name], MD;  Location: [facility];  Service: Orthopedics;  Laterality: Right;    Family History  Problem Relation Age of Onset   Melanoma Brother     Social History:  reports that he has quit smoking. He has never used smokeless tobacco. He reports that he does not currently use alcohol. He reports that he does not use drugs.    Allergies:  Allergies  Allergen Reactions    Morphine Itching    Patient states it is tolerable itching    Current Medications: Current Outpatient Medications  Medication Sig Dispense Refill   amLODipine (NORVASC) 5 MG tablet Take 5 mg by mouth daily.     clopidogrel (PLAVIX) 75 MG tablet Take 1 tablet by mouth daily.     docusate sodium (COLACE) 100 MG capsule 1 capsule 2 TIMES DAILY (route: oral)     famotidine (PEPCID) 40 MG tablet Take 1 tablet (40 mg total) by mouth daily. 30 tablet 5   Flaxseed, Linseed, (FLAXSEED OIL) 1000 MG CAPS Take 1,000 mg by mouth daily.     folic acid (FOLVITE) 427 MCG tablet Take 400 mcg by mouth daily.     gabapentin (NEURONTIN) 300 MG capsule Take 1 capsule (300 mg  total) by mouth 3 (three) times daily. 90 capsule 2   hydrochlorothiazide (HYDRODIURIL) 12.5 MG tablet Take 12.5 mg by mouth daily.     lisinopril (ZESTRIL) 20 MG tablet      Multiple Vitamin (MULTIVITAMIN WITH MINERALS) TABS tablet Take 1 tablet by mouth in the morning.     mupirocin ointment (BACTROBAN) 2 % Apply topically.     Omega-3 1000 MG CAPS Take by mouth.     ondansetron (ZOFRAN) 4 MG tablet Take 1 tablet (4 mg total) by mouth every 4 (four) hours as needed for nausea. 90 tablet 3   oxyCODONE (OXY IR/ROXICODONE) 5 MG immediate release tablet Take 1 tablet (5 mg total) by mouth every 6 (six) hours as needed for severe pain. 30 tablet 0   polyethylene glycol (MIRALAX / GLYCOLAX) 17 g packet Take 17 g by mouth daily. 14 each 0   polyethylene glycol powder (GLYCOLAX/MIRALAX) 17 GM/SCOOP powder SMARTSIG:17 Gram(s) By Mouth Daily PRN     pravastatin (PRAVACHOL) 40 MG tablet Take 40 mg by mouth daily.     prochlorperazine (COMPAZINE) 10 MG tablet Take 1 tablet (10 mg total) by mouth every 6 (six) hours as needed for nausea or vomiting. 90 tablet 3   traZODone (DESYREL) 50 MG tablet Take 50 mg by mouth at bedtime as needed for sleep.     No current facility-administered medications for this visit.    REVIEW OF SYSTEMS:  Review of  Systems  Constitutional: Negative.  Negative for appetite change, chills, diaphoresis, fatigue, fever and unexpected weight change.  HENT:  Negative.  Negative for hearing loss, lump/mass, mouth sores, nosebleeds, sore throat, tinnitus, trouble swallowing and voice change.   Eyes: Negative.  Negative for eye problems and icterus.  Respiratory:  Positive for cough. Negative for chest tightness, hemoptysis, shortness of breath and wheezing.   Cardiovascular:  Positive for leg swelling (LLE). Negative for chest pain and palpitations.  Gastrointestinal: Negative.  Negative for abdominal distention, abdominal pain, blood in stool, constipation, diarrhea, nausea, rectal pain and vomiting.  Endocrine: Negative.  Negative for hot flashes.  Genitourinary: Negative.  Negative for bladder incontinence, difficulty urinating, dyspareunia, dysuria, frequency, hematuria, nocturia, pelvic pain and penile discharge.   Musculoskeletal: Negative.  Negative for arthralgias, back pain, flank pain, gait problem, myalgias, neck pain and neck stiffness.  Skin:  Positive for wound (healing nicely). Negative for itching and rash.  Neurological: Negative.  Negative for dizziness, extremity weakness, gait problem, headaches, light-headedness, numbness, seizures and speech difficulty.  Hematological: Negative.  Negative for adenopathy. Does not bruise/bleed easily.  Psychiatric/Behavioral: Negative.  Negative for confusion, decreased concentration, depression, sleep disturbance and suicidal ideas. The patient is not nervous/anxious.      VITALS:  Blood pressure (!) 164/70, pulse 79, temperature 97.8 F (36.6 C), temperature source Oral, resp. rate 18, height 5' 9.69" (1.77 m), weight 234 lb 14.4 oz (106.5 kg), SpO2 94 %.  Wt Readings from Last 3 Encounters:  08/22/22 233 lb (105.7 kg)  08/18/22 234 lb 14.4 oz (106.5 kg)  07/24/22 232 lb (105.2 kg)    Body mass index is 34.01 kg/m.  Performance status (ECOG): 1 -  Symptomatic but completely ambulatory  PHYSICAL EXAM:  Physical Exam Vitals and nursing note reviewed.  Constitutional:      General: He is not in acute distress.    Appearance: Normal appearance. He is normal weight. He is not ill-appearing, toxic-appearing or diaphoretic.  HENT:     Head: Normocephalic and atraumatic.  Right Ear: Tympanic membrane, ear canal and external ear normal. There is no impacted cerumen.     Left Ear: Tympanic membrane, ear canal and external ear normal. There is no impacted cerumen.     Nose: Congestion present.     Mouth/Throat:     Mouth: Mucous membranes are moist.     Pharynx: Oropharynx is clear. No oropharyngeal exudate or posterior oropharyngeal erythema.  Eyes:     General: No scleral icterus.       Right eye: No discharge.        Left eye: No discharge.     Extraocular Movements: Extraocular movements intact.     Conjunctiva/sclera: Conjunctivae normal.     Pupils: Pupils are equal, round, and reactive to light.  Neck:     Vascular: No carotid bruit.  Cardiovascular:     Rate and Rhythm: Normal rate and regular rhythm.     Pulses: Normal pulses.     Heart sounds: Normal heart sounds. No murmur heard.    No friction rub. No gallop.  Pulmonary:     Effort: Pulmonary effort is normal. No respiratory distress.     Breath sounds: Normal breath sounds. No stridor. No wheezing, rhonchi or rales.  Chest:     Chest wall: No tenderness.  Abdominal:     General: Bowel sounds are normal. There is no distension.     Palpations: Abdomen is soft. There is no hepatomegaly, splenomegaly or mass.     Tenderness: There is no abdominal tenderness. There is no right CVA tenderness, left CVA tenderness, guarding or rebound.     Hernia: No hernia is present.  Musculoskeletal:        General: No swelling, tenderness, deformity or signs of injury. Normal range of motion.     Cervical back: Normal range of motion and neck supple. No rigidity or tenderness.      Right lower leg: No edema.     Left lower leg: 1+ Edema present.     Comments: 2+ edema of the left lower extremity. Wound healing well.  Feet:     Comments: 2+ edema of the left lower extremity Lymphadenopathy:     Cervical: No cervical adenopathy.     Upper Body:     Right upper body: No supraclavicular or axillary adenopathy.     Left upper body: No supraclavicular or axillary adenopathy.     Lower Body: No right inguinal adenopathy. No left inguinal adenopathy.  Skin:    General: Skin is warm and dry.     Coloration: Skin is not jaundiced or pale.     Findings: No bruising, erythema, lesion or rash.  Neurological:     General: No focal deficit present.     Mental Status: He is alert and oriented to person, place, and time. Mental status is at baseline.     Cranial Nerves: No cranial nerve deficit.     Sensory: No sensory deficit.     Motor: No weakness.     Coordination: Coordination normal.     Gait: Gait normal.     Deep Tendon Reflexes: Reflexes normal.  Psychiatric:        Mood and Affect: Mood normal.        Behavior: Behavior normal.        Thought Content: Thought content normal.        Judgment: Judgment normal.     LABS:      Latest Ref Rng & Units 08/16/2022  12:00 AM 07/20/2022    1:35 PM 07/07/2022    2:27 PM  CBC  WBC  15.9     12.4  14.5   Hemoglobin 13.5 - 17.5 13.9     13.5  13.4   Hematocrit 41 - 53 41     41.3  41.4   Platelets 150 - 400 K/uL 273     270  285      This result is from an external source.      Latest Ref Rng & Units 08/16/2022   12:00 AM 07/20/2022    1:35 PM 07/07/2022    2:27 PM  CMP  Glucose 70 - 99 mg/dL  131  124   BUN 4 - _0 Creatinine 0.6 - 1.3 0.8     1.02  1.01   Sodium 137 - 147 139     138  138   Potassium 3.5 - 5.1 mEq/L 4.1     3.7  3.9   Chloride 99 - 108 102     104  106   CO2 13 - _1 Calcium 8.7 - 10.7 8.7     8.8  8.8   Total Protein 6.5 - 8.1 g/dL  6.8  7.1   Total  Bilirubin 0.3 - 1.2 mg/dL  0.6  0.8   Alkaline Phos 25 - 125 108     85  96   AST 14 - 40 32     22  26   ALT 10 - 40 U/L _2 This result is from an external source.             Component Ref Range & Units 2 d ago (08/16/22) 4 wk ago (07/20/22) 1 mo ago (07/07/22) 2 mo ago (06/16/22) 2 mo ago (05/26/22) 3 mo ago (05/04/22) 3 mo ago (05/04/22)  Glucose  104 131 High  R, CM 124 High  R, CM 96 105       No results found for: "CEA1", "CEA" / No results found for: "CEA1", "CEA" No results found for: "PSA1" No results found for: "PPJ093" No results found for: "CAN125"  No results found for: "TOTALPROTELP", "ALBUMINELP", "A1GS", "A2GS", "BETS", "BETA2SER", "GAMS", "MSPIKE", "SPEI" No results found for: "TIBC", "FERRITIN", "IRONPCTSAT" Lab Results  Component Value Date   LDH 160 02/07/2022    STUDIES:  No results found.  Exam: 08/16/22 CT Chest, Abdomen, and Pelvis with contrast Chest Impressions The 2 dominant nodules in the RIGHT lung previously documented are not readily identified; however, there is a new branching nodularity/consolidation in RIGHT middle lobe. RIGHT middle lobe findings favor infectious or inflammatory process. No metastatic mediastinal adenopathy identified No cutaneous lesion.   Abdomen / Pelvis Impression No evidence of metastatic disease in the abdomen pelvis. Benign Bosniak 1 renal cysts. No follow-up recommended. No lymphadenopathy or peritoneal metastasis.     EXAM:02/16/2022 NUCLEAR MEDICINE PET WHOLE BODY IMPRESSION: There are 2 small foci of asymmetric focally increased metabolic activity within the left foot which are nonspecific, although may correspond with the patients melanoma or previous surgery related to that. No definite nodal metastases identified in the left lower extremity. No definite evidence of metabolically active metastatic disease. The previously identified pulmonary nodules are suboptimally evaluated due to  motion artifact and size, but do not show significan hypermetabolic activity.  However based on distribution, these remain suspicious for metastatic disease and chest CT follow up is recommended Coronary and Aortic Atherosclerosis.      I,Jasmine M Lassiter,acting as a scribe for Derwood Kaplan, MD.,have documented all relevant documentation on the behalf of Derwood Kaplan, MD,as directed by  Derwood Kaplan, MD while in the presence of Derwood Kaplan, MD.   I have reviewed this report as typed by the medical scribe, and it is complete and accurate.  Derwood Kaplan   09/08/22 6:44 PM

## 2022-08-19 ENCOUNTER — Other Ambulatory Visit: Payer: Self-pay

## 2022-08-22 ENCOUNTER — Inpatient Hospital Stay: Payer: Medicare HMO | Attending: Oncology

## 2022-08-22 VITALS — BP 147/72 | HR 80 | Temp 98.0°F | Resp 18 | Ht 69.69 in | Wt 233.0 lb

## 2022-08-22 DIAGNOSIS — Z79899 Other long term (current) drug therapy: Secondary | ICD-10-CM | POA: Diagnosis not present

## 2022-08-22 DIAGNOSIS — C439 Malignant melanoma of skin, unspecified: Secondary | ICD-10-CM

## 2022-08-22 DIAGNOSIS — C4372 Malignant melanoma of left lower limb, including hip: Secondary | ICD-10-CM | POA: Diagnosis not present

## 2022-08-22 DIAGNOSIS — Z5112 Encounter for antineoplastic immunotherapy: Secondary | ICD-10-CM | POA: Diagnosis not present

## 2022-08-22 DIAGNOSIS — C792 Secondary malignant neoplasm of skin: Secondary | ICD-10-CM

## 2022-08-22 MED ORDER — HEPARIN SOD (PORK) LOCK FLUSH 100 UNIT/ML IV SOLN: 500.0000 [IU] | Freq: Once | INTRAVENOUS | Status: DC | PRN

## 2022-08-22 MED ORDER — SODIUM CHLORIDE 0.9% FLUSH: 10.0000 mL | INTRAVENOUS | Status: DC | PRN

## 2022-08-22 MED ORDER — SODIUM CHLORIDE 0.9 % IV SOLN: Freq: Once | INTRAVENOUS | Status: AC

## 2022-08-22 MED ORDER — SODIUM CHLORIDE 0.9 % IV SOLN
480.0000 mg | Freq: Once | INTRAVENOUS | Status: AC
  Administered 2022-08-22: 480 mg via INTRAVENOUS
  Filled 2022-08-22: qty 48

## 2022-08-22 NOTE — Patient Instructions (Signed)
Nivolumab Injection What is this medication? NIVOLUMAB (nye VOL ue mab) treats some types of cancer. It works by helping your immune system slow or stop the spread of cancer cells. It is a monoclonal antibody. This medicine may be used for other purposes; ask your health care provider or pharmacist if you have questions. COMMON BRAND NAME(S): Opdivo What should I tell my care team before I take this medication? They need to know if you have any of these conditions: Allogeneic stem cell transplant (uses someone else's stem cells) Autoimmune diseases, such as Crohn disease, ulcerative colitis, lupus History of chest radiation Nervous system problems, such as Guillain-Barre syndrome or myasthenia gravis Organ transplant An unusual or allergic reaction to nivolumab, other medications, foods, dyes, or preservatives Pregnant or trying to get pregnant Breast-feeding How should I use this medication? This medication is infused into a vein. It is given in a hospital or clinic setting. A special MedGuide will be given to you before each treatment. Be sure to read this information carefully each time. Talk to your care team about the use of this medication in children. While it may be prescribed for children as young as 12 years for selected conditions, precautions do apply. Overdosage: If you think you have taken too much of this medicine contact a poison control center or emergency room at once. NOTE: This medicine is only for you. Do not share this medicine with others. What if I miss a dose? Keep appointments for follow-up doses. It is important not to miss your dose. Call your care team if you are unable to keep an appointment. What may interact with this medication? Interactions have not been studied. This list may not describe all possible interactions. Give your health care provider a list of all the medicines, herbs, non-prescription drugs, or dietary supplements you use. Also tell them if you  smoke, drink alcohol, or use illegal drugs. Some items may interact with your medicine. What should I watch for while using this medication? Your condition will be monitored carefully while you are receiving this medication. You may need blood work while taking this medication. This medication may cause serious skin reactions. They can happen weeks to months after starting the medication. Contact your care team right away if you notice fevers or flu-like symptoms with a rash. The rash may be red or purple and then turn into blisters or peeling of the skin. You may also notice a red rash with swelling of the face, lips, or lymph nodes in your neck or under your arms. Tell your care team right away if you have any change in your eyesight. Talk to your care team if you are pregnant or think you might be pregnant. A negative pregnancy test is required before starting this medication. A reliable form of contraception is recommended while taking this medication and for 5 months after the last dose. Talk to your care team about effective forms of contraception. Do not breast-feed while taking this medication and for 5 months after the last dose. What side effects may I notice from receiving this medication? Side effects that you should report to your care team as soon as possible: Allergic reactions--skin rash, itching, hives, swelling of the face, lips, tongue, or throat Dry cough, shortness of breath or trouble breathing Eye pain, redness, irritation, or discharge with blurry or decreased vision Heart muscle inflammation--unusual weakness or fatigue, shortness of breath, chest pain, fast or irregular heartbeat, dizziness, swelling of the ankles, feet, or hands Hormone   gland problems--headache, sensitivity to light, unusual weakness or fatigue, dizziness, fast or irregular heartbeat, increased sensitivity to cold or heat, excessive sweating, constipation, hair loss, increased thirst or amount of urine,  tremors or shaking, irritability Infusion reactions--chest pain, shortness of breath or trouble breathing, feeling faint or lightheaded Kidney injury (glomerulonephritis)--decrease in the amount of urine, red or dark brown urine, foamy or bubbly urine, swelling of the ankles, hands, or feet Liver injury--right upper belly pain, loss of appetite, nausea, light-colored stool, dark yellow or brown urine, yellowing skin or eyes, unusual weakness or fatigue Pain, tingling, or numbness in the hands or feet, muscle weakness, change in vision, confusion or trouble speaking, loss of balance or coordination, trouble walking, seizures Rash, fever, and swollen lymph nodes Redness, blistering, peeling, or loosening of the skin, including inside the mouth Sudden or severe stomach pain, bloody diarrhea, fever, nausea, vomiting Side effects that usually do not require medical attention (report these to your care team if they continue or are bothersome): Bone, joint, or muscle pain Diarrhea Fatigue Loss of appetite Nausea Skin rash This list may not describe all possible side effects. Call your doctor for medical advice about side effects. You may report side effects to FDA at 1-800-FDA-1088. Where should I keep my medication? This medication is given in a hospital or clinic. It will not be stored at home. NOTE: This sheet is a summary. It may not cover all possible information. If you have questions about this medicine, talk to your doctor, pharmacist, or health care provider.  2023 Elsevier/Gold Standard (2021-12-05 00:00:00)  

## 2022-08-24 ENCOUNTER — Encounter: Payer: Self-pay | Admitting: Oncology

## 2022-09-06 ENCOUNTER — Encounter: Payer: Self-pay | Admitting: Oncology

## 2022-09-08 ENCOUNTER — Encounter: Payer: Self-pay | Admitting: Oncology

## 2022-09-15 ENCOUNTER — Inpatient Hospital Stay: Payer: Medicare HMO

## 2022-09-15 ENCOUNTER — Encounter: Payer: Self-pay | Admitting: Oncology

## 2022-09-15 ENCOUNTER — Inpatient Hospital Stay (INDEPENDENT_AMBULATORY_CARE_PROVIDER_SITE_OTHER): Payer: Medicare HMO | Admitting: Oncology

## 2022-09-15 VITALS — BP 147/82 | HR 74 | Temp 98.2°F | Resp 16 | Ht 69.69 in | Wt 230.7 lb

## 2022-09-15 DIAGNOSIS — C439 Malignant melanoma of skin, unspecified: Secondary | ICD-10-CM

## 2022-09-15 DIAGNOSIS — Z5112 Encounter for antineoplastic immunotherapy: Secondary | ICD-10-CM | POA: Diagnosis not present

## 2022-09-15 DIAGNOSIS — C792 Secondary malignant neoplasm of skin: Secondary | ICD-10-CM

## 2022-09-15 DIAGNOSIS — D72829 Elevated white blood cell count, unspecified: Secondary | ICD-10-CM | POA: Diagnosis not present

## 2022-09-15 DIAGNOSIS — Z79899 Other long term (current) drug therapy: Secondary | ICD-10-CM | POA: Diagnosis not present

## 2022-09-15 DIAGNOSIS — C4372 Malignant melanoma of left lower limb, including hip: Secondary | ICD-10-CM | POA: Diagnosis not present

## 2022-09-15 LAB — CMP (CANCER CENTER ONLY)
ALT: 21 U/L (ref 0–44)
AST: 23 U/L (ref 15–41)
Albumin: 4.2 g/dL (ref 3.5–5.0)
Alkaline Phosphatase: 72 U/L (ref 38–126)
Anion gap: 14 (ref 5–15)
BUN: 31 mg/dL — ABNORMAL HIGH (ref 8–23)
CO2: 21 mmol/L — ABNORMAL LOW (ref 22–32)
Calcium: 9 mg/dL (ref 8.9–10.3)
Chloride: 101 mmol/L (ref 98–111)
Creatinine: 1.04 mg/dL (ref 0.61–1.24)
GFR, Estimated: >60 mL/min (ref >60)
Glucose, Bld: 95 mg/dL (ref 70–99)
Potassium: 4.2 mmol/L (ref 3.5–5.1)
Sodium: 136 mmol/L (ref 135–145)
Total Bilirubin: 1 mg/dL (ref 0.3–1.2)
Total Protein: 7.1 g/dL (ref 6.5–8.1)

## 2022-09-15 LAB — CBC WITH DIFFERENTIAL (CANCER CENTER ONLY)
Abs Immature Granulocytes: 0.08 10*3/uL — ABNORMAL HIGH (ref 0.00–0.07)
Basophils Absolute: 0 10*3/uL (ref 0.0–0.1)
Basophils Relative: 0 %
Eosinophils Absolute: 0.4 10*3/uL (ref 0.0–0.5)
Eosinophils Relative: 3 %
HCT: 44.9 % (ref 39.0–52.0)
Hemoglobin: 15.1 g/dL (ref 13.0–17.0)
Immature Granulocytes: 1 %
Lymphocytes Relative: 23 %
Lymphs Abs: 3.6 10*3/uL (ref 0.7–4.0)
MCH: 28.6 pg (ref 26.0–34.0)
MCHC: 33.6 g/dL (ref 30.0–36.0)
MCV: 85 fL (ref 80.0–100.0)
Monocytes Absolute: 1.3 10*3/uL — ABNORMAL HIGH (ref 0.1–1.0)
Monocytes Relative: 8 %
Neutro Abs: 10.3 10*3/uL — ABNORMAL HIGH (ref 1.7–7.7)
Neutrophils Relative %: 65 %
Platelet Count: 246 10*3/uL (ref 150–400)
RBC: 5.28 MIL/uL (ref 4.22–5.81)
RDW: 13.9 % (ref 11.5–15.5)
WBC Count: 15.6 10*3/uL — ABNORMAL HIGH (ref 4.0–10.5)
nRBC: 0 % (ref 0.0–0.2)

## 2022-09-15 NOTE — Progress Notes (Signed)
[facility]  [address] [place],  [place]  [address] [phone]  Clinic Day: 09/15/22   Referring physician: Consuello Closs, MD   ASSESSMENT & PLAN:   Malignant melanoma of the left foot He had a punch biopsy and this reveals a Breslow thickness of 1.6 mm and Clark's level 4 with ulceration for a T2b N0 M0 clinically.  We found no distant metastasis on PET scan.  His tumor is positive for BRAF mutation. He had this treated with wide local excision.   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   Multiple lesions of the lung These were suspicious for metastatic disease but have now resolved on the CT scan on 08/12/22.  The PET scan was negative but we cannot absolutely rule out metastatic disease since these lesions are small. We discussed the fact that the resolution of these lesions could represent a response to treatment, or may not have been malignant at all. This would affect the duration of his immunotherapy.   Spinal stenosis at L4-5 He has had surgical decompression and resection of a synovial cyst as of January 26 2022.  Leukocytosis He has had a leukocytosis for months but his white count increased to 31,000 with 81% neutrophils in October 2023. He had no symptoms of infection and urinalysis and urine culture were negative.  We did not find an explanation but the [facility] were back down to 12.1, and today is 15.9 with normal differential. He is recovering from the flu.   Plan: He is scheduled for day 1 cycle 8 on 09/19/2022.  I will see him back in 4 weeks with CBC and CMP and we will continue every 4 weeks. His labs today are pending. The patient was provided an opportunity to ask questions and all were answered.  The patient agreed with the plan and demonstrated an understanding of the  instructions.  The patient was advised to call back if the symptoms worsen or if the condition fails to improve as anticipated.  I provided 15 minutes of face-to-face time during this this encounter and > 50% was spent counseling as documented under my assessment and plan.    Derwood Kaplan, MD [facility] [address] [place] [address] Dept: [phone] Dept Fax: [phone]   CHIEF COMPLAINT:  CC: Malignant melanoma  Current Treatment: Evaluation and immunotherapy   HISTORY OF PRESENT ILLNESS:  [name] is a 77 y.o. male with a history of malignant melanoma who is referred in consultation with Dr. Bertram Millard for assessment and management.  He had a lesion of the dorsum of the left foot in September 2022 which appeared to be a granuloma and was treated in the office.  He later developed a another lesion lateral to this on the dorsum of the left foot which was not hyperpigmented but did bleed easily.  He also has several lesions of his left medial calf which are nodular and span approximately 6 cm.  He has had some increased edema of the left lower leg.  He was evaluated by dermatology on June 5, who felt this could be a squamous cell carcinoma versus tinea or a drug reaction.  He had biopsies done of both the foot lesion and the Lesion.  Pathology came back that the left  dorsal foot shave biopsy revealed a malignant melanoma possibly a primary nodular ulcerated melanoma which had a Breslow thickness of at least 1.6 mm and a Clark's level 4.  Margins are positive as this was a shave biopsy.  Ulceration is present but no satellitosis.  The mitotic index is 4/mm and no lymphovascular invasion was identified.  No brisk tumor infiltrating lymphocytes are seen and tumor regression is absent.  This is felt to be at least a T2b lesion and wide excision was recommended.  The lesion of the left medial calf revealed a dermal map  melanoma most consistent with metastatic disease.  This information was not available when the patient was admitted for back surgery on June 8 and he had a microlumbar decompression at L4-5 for spinal stenosis.  He was also found to have a synovial cyst at that time which was resected and benign.  He was readmitted several days later for acute pain of the right elbow and both arms.  Aspiration was obtained and this was diagnosed as gouty arthritis.  He did have temporary rehab after his back surgery.  His left leg was evaluated for deep venous thrombosis and was negative.  While he was recuperating from his surgery he had an x-ray which was abnormal which led to a CT scan which revealed at least 3 lung lesions.  MRI of the brain was negative and he is now referred for management of the malignant melanoma.  Oncology History  Malignant melanoma, metastatic ([place])  01/23/2022 Cancer Staging   Staging form: Melanoma of the Skin, AJCC 8th Edition - Clinical stage from 01/23/2022: Stage III (cT2b, cN1c, cM0) - Signed by Derwood Kaplan, MD on 04/07/2022 Histopathologic type: Malignant melanoma, NOS (except juvenile melanoma M-8770/[id number]) Stage prefix: Initial diagnosis Laterality: Left Lymph-vascular invasion (LVI): LVI not present (absent)/not identified Diagnostic confirmation: Positive histology Specimen type: Biopsy / Limited Resection Staged by: Managing physician Mitotic count: 4 Mitotic unit: mm2 Clark's level: Level IV Tumor-infiltrating lymphocytes: Present and non-brisk Breslow depth (mm): 16 Ulceration of the epidermis: Yes Microsatellites: No Primary tumor regression: Absent Intransit/satellite metastasis: In-transit Lymph node clinically or radiologically detected: No Microscopic confirmation of metastasis: No Sentinel lymph node biopsy performed: No Completion or therapeutic lymph node dissection performed: No Matted nodes: No Stage used in treatment planning: Yes National  guidelines used in treatment planning: Yes Type of national guideline used in treatment planning: NCCN   01/31/2022 Initial Diagnosis   Malignant melanoma, metastatic ([place])   04/14/2022 -  Chemotherapy   Patient is on Treatment Plan : MELANOMA Nivolumab (480) q28d (changed from q14d on 12/4)     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[place])  02/28/2022 Cancer Staging   Staging form: Melanoma of the Skin, AJCC 8th Edition - Clinical stage from 02/28/2022: Stage IV (cT2b(m), cN1c, cM1a) - Signed by Derwood Kaplan, MD on 04/07/2022 Histopathologic type: Malignant melanoma, NOS (except juvenile melanoma M-8770/[id number]) Stage prefix: Initial diagnosis Laterality: Left Multiple tumors: Yes Lymph-vascular invasion (LVI): LVI present/identified, NOS Diagnostic confirmation: Positive histology Specimen type: Excision Staged by: Managing physician Intransit/satellite metastasis: Both in-transit and satellite Prognostic indicators: 02/28/22:  Wide excision of primary of foot - no residual melanoma,  Excision of skin of calf consistent with intransit mets of dermal/subcuticular   Melanoma with LVI, metastatic disease with multifocal lesions and multifocal positive margins 03/24/22: Re-excision with a few atypical melanocytes, no melanoma and clear margins   04/07/2022 Initial Diagnosis   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 The CT scan shows the nodules of the right upper lobe and right middle lobe have resolved. However, he now has an area of consolidation in the right middle lobe which is new and consistent with infection or inflammation. We feel it is related to the infection he had last month but will follow-up later. Patient states that he is feeling well and had gotten over the flu  and RSV in December, 2023. He is scheduled for day 1 cycle 8 on 09/19/2022.  I will see him back in 4 weeks with CBC and CMP. His labs today are pending. He denies signs of infection such as sore throat, sinus drainage, cough, or urinary symptoms.  He denies fevers or recurrent chills. He denies pain. He denies nausea, vomiting, chest pain, dyspnea or cough. His appetite is well and he is actively trying to lose some weight gained in December, 2023. His weight has decreased 3 pounds over last 3 weeks .   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 , 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     Past Surgical History:  Procedure Laterality Date   LUMBAR LAMINECTOMY/DECOMPRESSION MICRODISCECTOMY N/A 01/26/2022   Procedure: Microlumbar decompression Lumbar four-five, lateral mass fusion with autograft and allograft bone;  Surgeon: Susa Day, MD;  Location: [facility];  Service: Orthopedics;  Laterality: N/A;   PARTIAL GASTRECTOMY     TOTAL HIP ARTHROPLASTY Right 06/18/2018   Procedure: RIGHT TOTAL HIP ARTHROPLASTY ANTERIOR APPROACH;  Surgeon: [name], MD;  Location: [facility];  Service: Orthopedics;  Laterality: Right;    Family History  Problem Relation Age of Onset   Melanoma Brother     Social History:  reports that he has quit smoking. He has never used smokeless tobacco. He reports that he does not currently use alcohol. He reports that he does not use drugs.    Allergies:  Allergies  Allergen Reactions   Morphine Itching    Patient states it is tolerable itching    Current Medications: Current Outpatient Medications  Medication Sig  Dispense Refill   amLODipine (NORVASC) 5 MG tablet Take 5 mg by mouth daily.     clopidogrel (PLAVIX) 75 MG tablet Take 1 tablet by mouth daily.     docusate sodium (COLACE) 100 MG capsule 1 capsule 2 TIMES DAILY (route: oral)     famotidine (PEPCID) 40 MG tablet Take 1 tablet (40 mg total) by mouth daily. 30 tablet 5   Flaxseed, Linseed, (FLAXSEED OIL) 1000 MG CAPS Take 1,000 mg by mouth daily.     folic acid (FOLVITE) A999333 MCG tablet Take 400 mcg by mouth daily.     gabapentin (NEURONTIN) 300 MG capsule Take 1 capsule (300 mg total) by mouth 3 (three) times daily. 90 capsule 2   hydrochlorothiazide (HYDRODIURIL) 12.5 MG tablet Take  12.5 mg by mouth daily.     lisinopril (ZESTRIL) 20 MG tablet      Multiple Vitamin (MULTIVITAMIN WITH MINERALS) TABS tablet Take 1 tablet by mouth in the morning.     mupirocin ointment (BACTROBAN) 2 % Apply topically.     Omega-3 1000 MG CAPS Take by mouth.     ondansetron (ZOFRAN) 4 MG tablet Take 1 tablet (4 mg total) by mouth every 4 (four) hours as needed for nausea. 90 tablet 3   oxyCODONE (OXY IR/ROXICODONE) 5 MG immediate release tablet Take 1 tablet (5 mg total) by mouth every 6 (six) hours as needed for severe pain. 30 tablet 0   polyethylene glycol (MIRALAX / GLYCOLAX) 17 g packet Take 17 g by mouth daily. 14 each 0   polyethylene glycol powder (GLYCOLAX/MIRALAX) 17 GM/SCOOP powder SMARTSIG:17 Gram(s) By Mouth Daily PRN     pravastatin (PRAVACHOL) 40 MG tablet Take 40 mg by mouth daily.     prochlorperazine (COMPAZINE) 10 MG tablet Take 1 tablet (10 mg total) by mouth every 6 (six) hours as needed for nausea or vomiting. 90 tablet 3   traZODone (DESYREL) 50 MG tablet Take 50 mg by mouth at bedtime as needed for sleep.     No current facility-administered medications for this visit.    REVIEW OF SYSTEMS:  Review of Systems  Constitutional: Negative.  Negative for appetite change, chills, diaphoresis, fatigue, fever and unexpected weight change.   HENT:  Negative.  Negative for hearing loss, lump/mass, mouth sores, nosebleeds, sore throat, tinnitus, trouble swallowing and voice change.   Eyes: Negative.  Negative for eye problems and icterus.  Respiratory: Negative.  Negative for chest tightness, cough, hemoptysis, shortness of breath and wheezing.   Cardiovascular:  Positive for leg swelling (LLE). Negative for chest pain and palpitations.  Gastrointestinal: Negative.  Negative for abdominal distention, abdominal pain, blood in stool, constipation, diarrhea, nausea, rectal pain and vomiting.  Endocrine: Negative.  Negative for hot flashes.  Genitourinary: Negative.  Negative for bladder incontinence, difficulty urinating, dyspareunia, dysuria, frequency, hematuria, nocturia, pelvic pain and penile discharge.   Musculoskeletal: Negative.  Negative for arthralgias, back pain, flank pain, gait problem, myalgias, neck pain and neck stiffness.  Skin:  Positive for itching. Negative for rash and wound.       Well healed scar below the knee.  Neurological: Negative.  Negative for dizziness, extremity weakness, gait problem, headaches, light-headedness, numbness, seizures and speech difficulty.  Hematological: Negative.  Negative for adenopathy. Does not bruise/bleed easily.  Psychiatric/Behavioral: Negative.  Negative for confusion, decreased concentration, depression, sleep disturbance and suicidal ideas. The patient is not nervous/anxious.      VITALS:  Blood pressure (!) 147/82, pulse 74, temperature 98.2 F (36.8 C), temperature source Oral, resp. rate 16, height 5' 9.69" (1.77 m), weight 230 lb 11.2 oz (104.6 kg), SpO2 96 %.  Wt Readings from Last 3 Encounters:  09/19/22 235 lb 1.3 oz (106.6 kg)  09/15/22 230 lb 11.2 oz (104.6 kg)  08/22/22 233 lb (105.7 kg)    Body mass index is 33.4 kg/m.  Performance status (ECOG): 1 - Symptomatic but completely ambulatory  PHYSICAL EXAM:  Physical Exam Vitals and nursing note reviewed.   Constitutional:      General: He is not in acute distress.    Appearance: Normal appearance. He is normal weight. He is not ill-appearing, toxic-appearing or diaphoretic.  HENT:     Head: Normocephalic and atraumatic.     Right Ear: Tympanic membrane, ear  canal and external ear normal. There is no impacted cerumen.     Left Ear: Tympanic membrane, ear canal and external ear normal. There is no impacted cerumen.     Nose: Nose normal. No congestion or rhinorrhea.     Mouth/Throat:     Mouth: Mucous membranes are moist.     Pharynx: Oropharynx is clear. No oropharyngeal exudate or posterior oropharyngeal erythema.  Eyes:     General: No scleral icterus.       Right eye: No discharge.        Left eye: No discharge.     Extraocular Movements: Extraocular movements intact.     Conjunctiva/sclera: Conjunctivae normal.     Pupils: Pupils are equal, round, and reactive to light.  Neck:     Vascular: No carotid bruit.  Cardiovascular:     Rate and Rhythm: Normal rate and regular rhythm.     Pulses: Normal pulses.     Heart sounds: Normal heart sounds. No murmur heard.    No friction rub. No gallop.  Pulmonary:     Effort: Pulmonary effort is normal. No respiratory distress.     Breath sounds: Normal breath sounds. No stridor. No wheezing, rhonchi or rales.  Chest:     Chest wall: No tenderness.  Abdominal:     General: Bowel sounds are normal. There is no distension.     Palpations: Abdomen is soft. There is no hepatomegaly, splenomegaly or mass.     Tenderness: There is no abdominal tenderness. There is no right CVA tenderness, left CVA tenderness, guarding or rebound.     Hernia: No hernia is present.  Musculoskeletal:        General: No swelling, tenderness, deformity or signs of injury. Normal range of motion.     Cervical back: Normal range of motion and neck supple. No rigidity or tenderness.     Right lower leg: No edema.     Left lower leg: No edema.     Comments: 1+ edema of  the left lower extremity. Large scar in the medial upper calf, healing well, with no evidence of reoccurrence. It is slightly itchy.   Lymphadenopathy:     Cervical: No cervical adenopathy.     Upper Body:     Right upper body: No supraclavicular or axillary adenopathy.     Left upper body: No supraclavicular or axillary adenopathy.     Lower Body: No right inguinal adenopathy. No left inguinal adenopathy.  Skin:    General: Skin is warm and dry.     Coloration: Skin is not jaundiced or pale.     Findings: No bruising, erythema, lesion or rash.  Neurological:     General: No focal deficit present.     Mental Status: He is alert and oriented to person, place, and time. Mental status is at baseline.     Cranial Nerves: No cranial nerve deficit.     Sensory: No sensory deficit.     Motor: No weakness.     Coordination: Coordination normal.     Gait: Gait normal.     Deep Tendon Reflexes: Reflexes normal.  Psychiatric:        Mood and Affect: Mood normal.        Behavior: Behavior normal.        Thought Content: Thought content normal.        Judgment: Judgment normal.     LABS:      Latest Ref Rng & Units 09/15/2022  10:15 AM 08/16/2022   12:00 AM 07/20/2022    1:35 PM  CBC  WBC 4.0 - 10.5 K/uL 15.6  15.9     12.4   Hemoglobin 13.0 - 17.0 g/dL 15.1  13.9     13.5   Hematocrit 39.0 - 52.0 % 44.9  41     41.3   Platelets 150 - 400 K/uL 246  273     270      This result is from an external source.      Latest Ref Rng & Units 09/15/2022   10:15 AM 08/16/2022   12:00 AM 07/20/2022    1:35 PM  CMP  Glucose 70 - 99 mg/dL 95   131   BUN 8 - 23 mg/dL 31  23     23   $ Creatinine 0.61 - 1.24 mg/dL 1.04  0.8     1.02   Sodium 135 - 145 mmol/L 136  139     138   Potassium 3.5 - 5.1 mmol/L 4.2  4.1     3.7   Chloride 98 - 111 mmol/L 101  102     104   CO2 22 - 32 mmol/L 21  26     28   $ Calcium 8.9 - 10.3 mg/dL 9.0  8.7     8.8   Total Protein 6.5 - 8.1 g/dL 7.1   6.8   Total  Bilirubin 0.3 - 1.2 mg/dL 1.0   0.6   Alkaline Phos 38 - 126 U/L 72  108     85   AST 15 - 41 U/L 23  32     22   ALT 0 - 44 U/L 21  27     21      $ This result is from an external source.             Component Ref Range & Units 2 d ago (08/16/22) 4 wk ago (07/20/22) 1 mo ago (07/07/22) 2 mo ago (06/16/22) 2 mo ago (05/26/22) 3 mo ago (05/04/22) 3 mo ago (05/04/22)  Glucose  104 131 High  R, CM 124 High  R, CM 96 105       No results found for: "CEA1", "CEA" / No results found for: "CEA1", "CEA" No results found for: "PSA1" No results found for: "[id number]" No results found for: "CAN125"  No results found for: "TOTALPROTELP", "ALBUMINELP", "A1GS", "A2GS", "BETS", "BETA2SER", "GAMS", "MSPIKE", "SPEI" No results found for: "TIBC", "FERRITIN", "IRONPCTSAT" Lab Results  Component Value Date   LDH 160 02/07/2022    STUDIES:  No results found.  Exam: 08/16/22 CT Chest, Abdomen, and Pelvis with contrast Chest Impressions The 2 dominant nodules in the RIGHT lung previously documented are not readily identified; however, there is a new branching nodularity/consolidation in RIGHT middle lobe. RIGHT middle lobe findings favor infectious or inflammatory process. No metastatic mediastinal adenopathy identified No cutaneous lesion.   Abdomen / Pelvis Impression No evidence of metastatic disease in the abdomen pelvis. Benign Bosniak 1 renal cysts. No follow-up recommended. No lymphadenopathy or peritoneal metastasis.         I,Jasmine M Lassiter,acting as a scribe for Derwood Kaplan, MD.,have documented all relevant documentation on the behalf of Derwood Kaplan, MD,as directed by  Derwood Kaplan, MD while in the presence of Derwood Kaplan, MD.   I have reviewed this report as typed by the medical scribe, and it is complete and accurate.  Derwood Kaplan  10/01/22 10:07 AM

## 2022-09-17 ENCOUNTER — Other Ambulatory Visit: Payer: Self-pay

## 2022-09-18 MED FILL — Nivolumab IV Soln 100 MG/10ML: INTRAVENOUS | Qty: 48 | Status: AC

## 2022-09-19 ENCOUNTER — Inpatient Hospital Stay: Payer: Medicare HMO | Attending: Oncology

## 2022-09-19 VITALS — BP 107/66 | HR 72 | Temp 98.7°F | Resp 18 | Ht 69.69 in | Wt 235.1 lb

## 2022-09-19 DIAGNOSIS — Z5112 Encounter for antineoplastic immunotherapy: Secondary | ICD-10-CM | POA: Insufficient documentation

## 2022-09-19 DIAGNOSIS — C439 Malignant melanoma of skin, unspecified: Secondary | ICD-10-CM

## 2022-09-19 DIAGNOSIS — C4372 Malignant melanoma of left lower limb, including hip: Secondary | ICD-10-CM | POA: Insufficient documentation

## 2022-09-19 DIAGNOSIS — Z79899 Other long term (current) drug therapy: Secondary | ICD-10-CM | POA: Diagnosis not present

## 2022-09-19 DIAGNOSIS — C792 Secondary malignant neoplasm of skin: Secondary | ICD-10-CM

## 2022-09-19 MED ORDER — SODIUM CHLORIDE 0.9 % IV SOLN
480.0000 mg | Freq: Once | INTRAVENOUS | Status: AC
  Administered 2022-09-19: 480 mg via INTRAVENOUS
  Filled 2022-09-19: qty 48

## 2022-09-19 MED ORDER — SODIUM CHLORIDE 0.9% FLUSH
10.0000 mL | INTRAVENOUS | Status: DC | PRN
  Administered 2022-09-19: 10 mL

## 2022-09-19 MED ORDER — HEPARIN SOD (PORK) LOCK FLUSH 100 UNIT/ML IV SOLN
500.0000 [IU] | Freq: Once | INTRAVENOUS | Status: AC | PRN
  Administered 2022-09-19: 500 [IU]

## 2022-09-19 MED ORDER — SODIUM CHLORIDE 0.9 % IV SOLN: Freq: Once | INTRAVENOUS | Status: AC

## 2022-09-19 NOTE — Patient Instructions (Signed)
Nivolumab Injection What is this medication? NIVOLUMAB (nye VOL ue mab) treats some types of cancer. It works by helping your immune system slow or stop the spread of cancer cells. It is a monoclonal antibody. This medicine may be used for other purposes; ask your health care provider or pharmacist if you have questions. COMMON BRAND NAME(S): Opdivo What should I tell my care team before I take this medication? They need to know if you have any of these conditions: Allogeneic stem cell transplant (uses someone else's stem cells) Autoimmune diseases, such as Crohn disease, ulcerative colitis, lupus History of chest radiation Nervous system problems, such as Guillain-Barre syndrome or myasthenia gravis Organ transplant An unusual or allergic reaction to nivolumab, other medications, foods, dyes, or preservatives Pregnant or trying to get pregnant Breast-feeding How should I use this medication? This medication is infused into a vein. It is given in a hospital or clinic setting. A special MedGuide will be given to you before each treatment. Be sure to read this information carefully each time. Talk to your care team about the use of this medication in children. While it may be prescribed for children as young as 12 years for selected conditions, precautions do apply. Overdosage: If you think you have taken too much of this medicine contact a poison control center or emergency room at once. NOTE: This medicine is only for you. Do not share this medicine with others. What if I miss a dose? Keep appointments for follow-up doses. It is important not to miss your dose. Call your care team if you are unable to keep an appointment. What may interact with this medication? Interactions have not been studied. This list may not describe all possible interactions. Give your health care provider a list of all the medicines, herbs, non-prescription drugs, or dietary supplements you use. Also tell them if you  smoke, drink alcohol, or use illegal drugs. Some items may interact with your medicine. What should I watch for while using this medication? Your condition will be monitored carefully while you are receiving this medication. You may need blood work while taking this medication. This medication may cause serious skin reactions. They can happen weeks to months after starting the medication. Contact your care team right away if you notice fevers or flu-like symptoms with a rash. The rash may be red or purple and then turn into blisters or peeling of the skin. You may also notice a red rash with swelling of the face, lips, or lymph nodes in your neck or under your arms. Tell your care team right away if you have any change in your eyesight. Talk to your care team if you are pregnant or think you might be pregnant. A negative pregnancy test is required before starting this medication. A reliable form of contraception is recommended while taking this medication and for 5 months after the last dose. Talk to your care team about effective forms of contraception. Do not breast-feed while taking this medication and for 5 months after the last dose. What side effects may I notice from receiving this medication? Side effects that you should report to your care team as soon as possible: Allergic reactions--skin rash, itching, hives, swelling of the face, lips, tongue, or throat Dry cough, shortness of breath or trouble breathing Eye pain, redness, irritation, or discharge with blurry or decreased vision Heart muscle inflammation--unusual weakness or fatigue, shortness of breath, chest pain, fast or irregular heartbeat, dizziness, swelling of the ankles, feet, or hands Hormone  gland problems--headache, sensitivity to light, unusual weakness or fatigue, dizziness, fast or irregular heartbeat, increased sensitivity to cold or heat, excessive sweating, constipation, hair loss, increased thirst or amount of urine,  tremors or shaking, irritability Infusion reactions--chest pain, shortness of breath or trouble breathing, feeling faint or lightheaded Kidney injury (glomerulonephritis)--decrease in the amount of urine, red or dark brown urine, foamy or bubbly urine, swelling of the ankles, hands, or feet Liver injury--right upper belly pain, loss of appetite, nausea, light-colored stool, dark yellow or brown urine, yellowing skin or eyes, unusual weakness or fatigue Pain, tingling, or numbness in the hands or feet, muscle weakness, change in vision, confusion or trouble speaking, loss of balance or coordination, trouble walking, seizures Rash, fever, and swollen lymph nodes Redness, blistering, peeling, or loosening of the skin, including inside the mouth Sudden or severe stomach pain, bloody diarrhea, fever, nausea, vomiting Side effects that usually do not require medical attention (report these to your care team if they continue or are bothersome): Bone, joint, or muscle pain Diarrhea Fatigue Loss of appetite Nausea Skin rash This list may not describe all possible side effects. Call your doctor for medical advice about side effects. You may report side effects to FDA at 1-800-FDA-1088. Where should I keep my medication? This medication is given in a hospital or clinic. It will not be stored at home. NOTE: This sheet is a summary. It may not cover all possible information. If you have questions about this medicine, talk to your doctor, pharmacist, or health care provider.  2023 [employer] (2021-12-05 00:00:00)

## 2022-10-01 ENCOUNTER — Encounter: Payer: Self-pay | Admitting: Oncology

## 2022-10-11 NOTE — Progress Notes (Signed)
[name]  [address] [place],  [place]  [address] [phone]  Clinic Day: 10/13/22   Referring physician: Derwood Kaplan, MD  ASSESSMENT & PLAN:   Malignant melanoma of the left foot He had a punch biopsy and this reveals a Breslow thickness of 1.6 mm and Clark's level 4 with ulceration for a T2b N0 M0 clinically.  We found no distant metastasis on PET scan.  His tumor is positive for BRAF mutation. He had this treated with wide local excision.   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   Multiple lesions of the lung These were suspicious for metastatic disease but have now resolved on the CT scan on 08/12/22.  The PET scan was negative but we cannot absolutely rule out metastatic disease since these lesions are small. We discussed the fact that the resolution of these lesions could represent a response to treatment, or may not have been malignant at all. This would affect the duration of his immunotherapy.   Spinal stenosis at L4-5 He has had surgical decompression and resection of a synovial cyst as of January 26 2022.  Leukocytosis He has had chronic leukocytosis and no symptoms of infection and urinalysis and urine culture were negative.  We did not find an explanation but the [facility] continue to fluctuate up and down.  Plan: He had repeat scans recently which were clear. We discussed repeating scans in 3-6 months. His labs today are pending. I will see him back in 4 weeks with CBC and CMP. The patient was provided an opportunity to ask questions and all were answered.  The patient agreed with the plan and demonstrated an understanding of the instructions.  The patient was advised to call back if the symptoms worsen or if the condition fails to improve as anticipated.  I provided 18  minutes of face-to-face time during this this encounter and > 50% was spent counseling as documented under my assessment and plan.   Derwood Kaplan, MD [facility] [address] [place] [address] Dept: [phone] Dept Fax: [phone]   CHIEF COMPLAINT:  CC: Malignant melanoma  Current Treatment: Evaluation and immunotherapy   HISTORY OF PRESENT ILLNESS:  [name] is a 77 y.o. male with a history of malignant melanoma who is referred in consultation with Dr. Bertram Millard for assessment and management.  He had a lesion of the dorsum of the left foot in September 2022 which appeared to be a granuloma and was treated in the office.  He later developed a another lesion lateral to this on the dorsum of the left foot which was not hyperpigmented but did bleed easily.  He also has several lesions of his left medial calf which are nodular and span approximately 6 cm.  He has had some increased edema of the left lower leg.  He was evaluated by dermatology on June 5, who felt this could be a squamous cell carcinoma versus tinea or a drug reaction.  He had biopsies done of both the foot lesion and the Lesion.  Pathology came back that the left dorsal foot shave biopsy revealed a malignant melanoma possibly a primary nodular ulcerated melanoma which had a Breslow thickness of at least 1.6 mm and a Clark's level 4.  Margins are positive  as this was a shave biopsy.  Ulceration is present but no satellitosis.  The mitotic index is 4/mm and no lymphovascular invasion was identified.  No brisk tumor infiltrating lymphocytes are seen and tumor regression is absent.  This is felt to be at least a T2b lesion and wide excision was recommended.  The lesion of the left medial calf revealed a dermal map melanoma most consistent with metastatic disease.  This information was not available when the patient was admitted for back surgery on June 8  and he had a microlumbar decompression at L4-5 for spinal stenosis.  He was also found to have a synovial cyst at that time which was resected and benign.  He was readmitted several days later for acute pain of the right elbow and both arms.  Aspiration was obtained and this was diagnosed as gouty arthritis.  He did have temporary rehab after his back surgery.  His left leg was evaluated for deep venous thrombosis and was negative.  While he was recuperating from his surgery he had an x-ray which was abnormal which led to a CT scan which revealed at least 3 lung lesions.  MRI of the brain was negative and he is now referred for management of the malignant melanoma.  Oncology History  Malignant melanoma, metastatic ([place])  01/23/2022 Cancer Staging   Staging form: Melanoma of the Skin, AJCC 8th Edition - Clinical stage from 01/23/2022: Stage III (cT2b, cN1c, cM0) - Signed by Derwood Kaplan, MD on 04/07/2022 Histopathologic type: Malignant melanoma, NOS (except juvenile melanoma M-8770/[id number]) Stage prefix: Initial diagnosis Laterality: Left Lymph-vascular invasion (LVI): LVI not present (absent)/not identified Diagnostic confirmation: Positive histology Specimen type: Biopsy / Limited Resection Staged by: Managing physician Mitotic count: 4 Mitotic unit: mm2 Clark's level: Level IV Tumor-infiltrating lymphocytes: Present and non-brisk Breslow depth (mm): 16 Ulceration of the epidermis: Yes Microsatellites: No Primary tumor regression: Absent Intransit/satellite metastasis: In-transit Lymph node clinically or radiologically detected: No Microscopic confirmation of metastasis: No Sentinel lymph node biopsy performed: No Completion or therapeutic lymph node dissection performed: No Matted nodes: No Stage used in treatment planning: Yes National guidelines used in treatment planning: Yes Type of national guideline used in treatment planning: NCCN   01/31/2022 Initial Diagnosis   Malignant  melanoma, metastatic ([place])   04/14/2022 -  Chemotherapy   Patient is on Treatment Plan : MELANOMA Nivolumab (480) q28d (changed from q14d on 12/4)     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[place])  02/28/2022 Cancer Staging   Staging form: Melanoma of the Skin, AJCC 8th Edition - Clinical stage from 02/28/2022: Stage IV (cT2b(m), cN1c, cM1a) - Signed by Derwood Kaplan, MD on 04/07/2022 Histopathologic type: Malignant melanoma, NOS (except juvenile melanoma M-8770/0) Stage prefix: Initial diagnosis Laterality: Left Multiple tumors: Yes Lymph-vascular invasion (LVI): LVI present/identified, NOS Diagnostic confirmation: Positive histology Specimen type: Excision Staged by: Managing physician Intransit/satellite metastasis: Both in-transit and satellite Prognostic indicators: 02/28/22:  Wide excision of primary of foot - no residual melanoma,  Excision of skin of calf consistent with intransit mets of dermal/subcuticular   Melanoma with LVI, metastatic disease with multifocal lesions and multifocal positive margins 03/24/22: Re-excision with a few atypical melanocytes, no melanoma and clear margins   04/07/2022 Initial Diagnosis   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 The patient states that he is feeling great and has no complaints. He does have sinus drainage with clear mucous and I advised him to monitor the color of it. He had repeat scans recently which were clear. We discussed repeating scans in 3-6 months. His labs today are pending. He will receive his immunotherapy next week.  I will see him back in 4 weeks with CBC and CMP.  He denies signs of infection such as sore throat, sinus drainage, cough, or urinary symptoms.  He denies fevers or recurrent chills. He denies pain.  He denies nausea, vomiting, chest pain, dyspnea or cough. His weight has increased 1 pounds over last month . He is accompanied at today's visit with his wife.   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 , 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    Past Surgical History:  Procedure Laterality Date   LUMBAR LAMINECTOMY/DECOMPRESSION MICRODISCECTOMY N/A 01/26/2022   Procedure: Microlumbar decompression Lumbar four-five, lateral mass fusion with autograft and allograft bone;  Surgeon: Susa Day, MD;  Location: [facility];  Service: Orthopedics;  Laterality: N/A;   PARTIAL GASTRECTOMY     TOTAL HIP ARTHROPLASTY Right 06/18/2018   Procedure: RIGHT TOTAL HIP ARTHROPLASTY ANTERIOR APPROACH;  Surgeon: [name], MD;  Location: [facility];  Service: Orthopedics;  Laterality: Right;    Family History  Problem Relation Age of Onset   Lung cancer Sister    Melanoma Brother     Social History:  reports that he has quit smoking. He has never used smokeless tobacco. He reports that he does not currently use alcohol. He reports that he does not use drugs.    Allergies:  Allergies  Allergen Reactions   Morphine Itching    Patient states it is tolerable itching    Current Medications: Current Outpatient Medications  Medication Sig Dispense Refill   amLODipine (NORVASC) 5 MG tablet Take 5 mg by mouth daily.     clopidogrel (PLAVIX) 75 MG tablet Take 1 tablet by mouth daily.     docusate sodium (COLACE) 100 MG capsule 1 capsule 2 TIMES DAILY (route: oral)     famotidine (PEPCID) 40 MG tablet Take 1 tablet (40 mg total) by mouth daily. 30 tablet 5    Flaxseed, Linseed, (FLAXSEED OIL) 1000 MG CAPS Take 1,000 mg by mouth daily.     folic acid (FOLVITE) A999333 MCG tablet Take 400 mcg by mouth daily.     gabapentin (NEURONTIN) 300 MG capsule Take 1 capsule (300 mg total) by mouth 3 (three) times daily. 90 capsule 2   hydrochlorothiazide (HYDRODIURIL) 12.5 MG tablet Take 12.5 mg by mouth daily.     lisinopril (ZESTRIL) 20 MG tablet      Multiple Vitamin (MULTIVITAMIN WITH MINERALS) TABS tablet Take 1 tablet by mouth in the morning.     mupirocin ointment (BACTROBAN) 2 % Apply topically.     Omega-3 1000 MG CAPS Take by mouth.     ondansetron (ZOFRAN) 4 MG tablet  Take 1 tablet (4 mg total) by mouth every 4 (four) hours as needed for nausea. 90 tablet 3   oxyCODONE (OXY IR/ROXICODONE) 5 MG immediate release tablet Take 1 tablet (5 mg total) by mouth every 6 (six) hours as needed for severe pain. 30 tablet 0   polyethylene glycol (MIRALAX / GLYCOLAX) 17 g packet Take 17 g by mouth daily. 14 each 0   polyethylene glycol powder (GLYCOLAX/MIRALAX) 17 GM/SCOOP powder SMARTSIG:17 Gram(s) By Mouth Daily PRN     pravastatin (PRAVACHOL) 40 MG tablet Take 40 mg by mouth daily.     prochlorperazine (COMPAZINE) 10 MG tablet Take 1 tablet (10 mg total) by mouth every 6 (six) hours as needed for nausea or vomiting. 90 tablet 3   traZODone (DESYREL) 50 MG tablet Take 50 mg by mouth at bedtime as needed for sleep.     No current facility-administered medications for this visit.    REVIEW OF SYSTEMS:  Review of Systems  Constitutional: Negative.  Negative for appetite change, chills, diaphoresis, fatigue, fever and unexpected weight change.  HENT:  Negative.  Negative for hearing loss, lump/mass, mouth sores, nosebleeds, sore throat, tinnitus, trouble swallowing and voice change.        Sinus drainage.  Eyes: Negative.  Negative for eye problems and icterus.  Respiratory: Negative.  Negative for chest tightness, cough, hemoptysis, shortness of breath and  wheezing.   Cardiovascular:  Positive for leg swelling (LLE). Negative for chest pain and palpitations.  Gastrointestinal: Negative.  Negative for abdominal distention, abdominal pain, blood in stool, constipation, diarrhea, nausea, rectal pain and vomiting.  Endocrine: Negative.  Negative for hot flashes.  Genitourinary: Negative.  Negative for bladder incontinence, difficulty urinating, dyspareunia, dysuria, frequency, hematuria, nocturia, pelvic pain and penile discharge.   Musculoskeletal: Negative.  Negative for arthralgias, back pain, flank pain, gait problem, myalgias, neck pain and neck stiffness.  Skin: Negative.  Negative for itching, rash and wound.       Well healed scar below the knee.  Neurological: Negative.  Negative for dizziness, extremity weakness, gait problem, headaches, light-headedness, numbness, seizures and speech difficulty.  Hematological: Negative.  Negative for adenopathy. Does not bruise/bleed easily.  Psychiatric/Behavioral: Negative.  Negative for confusion, decreased concentration, depression, sleep disturbance and suicidal ideas. The patient is not nervous/anxious.      VITALS:  Blood pressure 138/71, pulse 75, temperature 98.1 F (36.7 C), temperature source Oral, resp. rate 18, height 5' 9.69" (1.77 m), weight 231 lb 4.8 oz (104.9 kg), SpO2 99 %.  Wt Readings from Last 3 Encounters:  10/16/22 234 lb (106.1 kg)  10/13/22 231 lb 4.8 oz (104.9 kg)  09/19/22 235 lb 1.3 oz (106.6 kg)    Body mass index is 33.48 kg/m.  Performance status (ECOG): 1 - Symptomatic but completely ambulatory  PHYSICAL EXAM:  Physical Exam Vitals and nursing note reviewed.  Constitutional:      General: He is not in acute distress.    Appearance: Normal appearance. He is normal weight. He is not ill-appearing, toxic-appearing or diaphoretic.  HENT:     Head: Normocephalic and atraumatic.     Right Ear: Tympanic membrane, ear canal and external ear normal. There is no impacted  cerumen.     Left Ear: Tympanic membrane, ear canal and external ear normal. There is no impacted cerumen.     Nose: Nose normal. No congestion or rhinorrhea.     Mouth/Throat:     Mouth: Mucous membranes are moist.     Pharynx:  Oropharynx is clear. No oropharyngeal exudate or posterior oropharyngeal erythema.  Eyes:     General: No scleral icterus.       Right eye: No discharge.        Left eye: No discharge.     Extraocular Movements: Extraocular movements intact.     Conjunctiva/sclera: Conjunctivae normal.     Pupils: Pupils are equal, round, and reactive to light.  Neck:     Vascular: No carotid bruit.  Cardiovascular:     Rate and Rhythm: Normal rate and regular rhythm.     Pulses: Normal pulses.     Heart sounds: Normal heart sounds. No murmur heard.    No friction rub. No gallop.  Pulmonary:     Effort: Pulmonary effort is normal. No respiratory distress.     Breath sounds: Normal breath sounds. No stridor. No wheezing, rhonchi or rales.  Chest:     Chest wall: No tenderness.  Abdominal:     General: Bowel sounds are normal. There is no distension.     Palpations: Abdomen is soft. There is no hepatomegaly, splenomegaly or mass.     Tenderness: There is no abdominal tenderness. There is no right CVA tenderness, left CVA tenderness, guarding or rebound.     Hernia: No hernia is present.  Musculoskeletal:        General: No swelling, tenderness, deformity or signs of injury. Normal range of motion.     Cervical back: Normal range of motion and neck supple. No rigidity or tenderness.     Right lower leg: No edema.     Left lower leg: 2+ Edema present.     Comments: 2+ edema of the left lower extremity. Large scar in the medial upper calf, healing well, with no evidence of reoccurrence.  Lymphadenopathy:     Cervical: No cervical adenopathy.     Upper Body:     Right upper body: No supraclavicular or axillary adenopathy.     Left upper body: No supraclavicular or axillary  adenopathy.     Lower Body: No right inguinal adenopathy. No left inguinal adenopathy.  Skin:    General: Skin is warm and dry.     Coloration: Skin is not jaundiced or pale.     Findings: No bruising, erythema, lesion or rash.  Neurological:     General: No focal deficit present.     Mental Status: He is alert and oriented to person, place, and time. Mental status is at baseline.     Cranial Nerves: No cranial nerve deficit.     Sensory: No sensory deficit.     Motor: No weakness.     Coordination: Coordination normal.     Gait: Gait normal.     Deep Tendon Reflexes: Reflexes normal.  Psychiatric:        Mood and Affect: Mood normal.        Behavior: Behavior normal.        Thought Content: Thought content normal.        Judgment: Judgment normal.     LABS:      Latest Ref Rng & Units 10/13/2022   12:00 AM 09/15/2022   10:15 AM 08/16/2022   12:00 AM  CBC  WBC  16.6     15.6  15.9      Hemoglobin 13.5 - 17.5 13.9     15.1  13.9      Hematocrit 41 - 53 41     44.9  41  Platelets 150 - 400 K/uL 261     246  273         This result is from an external source.      Latest Ref Rng & Units 10/13/2022   12:00 AM 09/15/2022   10:15 AM 08/16/2022   12:00 AM  CMP  Glucose 70 - 99 mg/dL  95    BUN 4 - '21 19     31  23      '$ Creatinine 0.6 - 1.3 0.9     1.04  0.8      Sodium 137 - 147 139     136  139      Potassium 3.5 - 5.1 mEq/L 4.3     4.2  4.1      Chloride 99 - 108 105     101  102      CO2 13 - '22 25     21  26      '$ Calcium 8.7 - 10.7 9.0     9.0  8.7      Total Protein 6.5 - 8.1 g/dL  7.1    Total Bilirubin 0.3 - 1.2 mg/dL  1.0    Alkaline Phos 25 - 125 95     72  108      AST 14 - 40 111     23  32      ALT 10 - 40 U/L '22     21  27         '$ This result is from an external source.   Component Ref Range & Units 2 d ago (08/16/22) 4 wk ago (07/20/22) 1 mo ago (07/07/22) 2 mo ago (06/16/22) 2 mo ago (05/26/22)  Glucose  104 131 High  R, CM 124 High  R, CM 96  105     No results found for: "CEA1", "CEA" / No results found for: "CEA1", "CEA" No results found for: "PSA1" No results found for: "[id number]" No results found for: "CAN125"  No results found for: "TOTALPROTELP", "ALBUMINELP", "A1GS", "A2GS", "BETS", "BETA2SER", "GAMS", "MSPIKE", "SPEI" No results found for: "TIBC", "FERRITIN", "IRONPCTSAT" Lab Results  Component Value Date   LDH 160 02/07/2022    STUDIES:  No results found.  Exam: 08/16/22 CT Chest, Abdomen, and Pelvis with contrast Chest Impressions: The 2 dominant nodules in the RIGHT lung previously documented are not readily identified; however, there is a new branching nodularity/consolidation in RIGHT middle lobe. RIGHT middle lobe findings favor infectious or inflammatory process. No metastatic mediastinal adenopathy identified No cutaneous lesion.  Abdomen / Pelvis Impression: No evidence of metastatic disease in the abdomen pelvis. Benign Bosniak 1 renal cysts. No follow-up recommended. No lymphadenopathy or peritoneal metastasis.         I,Jasmine M Lassiter,acting as a scribe for Derwood Kaplan, MD.,have documented all relevant documentation on the behalf of Derwood Kaplan, MD,as directed by  Derwood Kaplan, MD while in the presence of Derwood Kaplan, MD.   I have reviewed this report as typed by the medical scribe, and it is complete and accurate.  Derwood Kaplan   10/27/22 7:10 AM

## 2022-10-13 ENCOUNTER — Inpatient Hospital Stay: Payer: Medicare HMO | Attending: Oncology | Admitting: Oncology

## 2022-10-13 ENCOUNTER — Telehealth: Payer: Self-pay | Admitting: Oncology

## 2022-10-13 ENCOUNTER — Encounter: Payer: Self-pay | Admitting: Oncology

## 2022-10-13 ENCOUNTER — Inpatient Hospital Stay: Payer: Medicare HMO

## 2022-10-13 VITALS — BP 138/71 | HR 75 | Temp 98.1°F | Resp 18 | Ht 69.69 in | Wt 231.3 lb

## 2022-10-13 DIAGNOSIS — D649 Anemia, unspecified: Secondary | ICD-10-CM | POA: Diagnosis not present

## 2022-10-13 DIAGNOSIS — C792 Secondary malignant neoplasm of skin: Secondary | ICD-10-CM | POA: Diagnosis not present

## 2022-10-13 DIAGNOSIS — Z5112 Encounter for antineoplastic immunotherapy: Secondary | ICD-10-CM | POA: Diagnosis not present

## 2022-10-13 DIAGNOSIS — C439 Malignant melanoma of skin, unspecified: Secondary | ICD-10-CM | POA: Diagnosis not present

## 2022-10-13 DIAGNOSIS — C4372 Malignant melanoma of left lower limb, including hip: Secondary | ICD-10-CM | POA: Insufficient documentation

## 2022-10-13 DIAGNOSIS — R918 Other nonspecific abnormal finding of lung field: Secondary | ICD-10-CM | POA: Diagnosis not present

## 2022-10-13 DIAGNOSIS — Z79899 Other long term (current) drug therapy: Secondary | ICD-10-CM | POA: Insufficient documentation

## 2022-10-13 LAB — CBC AND DIFFERENTIAL
HCT: 41 (ref 41–53)
Hemoglobin: 13.9 (ref 13.5–17.5)
Neutrophils Absolute: 11.62
Platelets: 261 10*3/uL (ref 150–400)
WBC: 16.6

## 2022-10-13 LAB — BASIC METABOLIC PANEL
BUN: 19 (ref 4–21)
CO2: 25 — AB (ref 13–22)
Chloride: 105 (ref 99–108)
Creatinine: 0.9 (ref 0.6–1.3)
Glucose: 90
Potassium: 4.3 mEq/L (ref 3.5–5.1)
Sodium: 139 (ref 137–147)

## 2022-10-13 LAB — COMPREHENSIVE METABOLIC PANEL
Albumin: 4.3 (ref 3.5–5.0)
Calcium: 9 (ref 8.7–10.7)

## 2022-10-13 LAB — HEPATIC FUNCTION PANEL
ALT: 22 U/L (ref 10–40)
AST: 111 — AB (ref 14–40)
Alkaline Phosphatase: 95 (ref 25–125)
Bilirubin, Total: 1.2

## 2022-10-13 LAB — CBC: RBC: 4.86 (ref 3.87–5.11)

## 2022-10-13 LAB — TSH: TSH: 1.551 u[IU]/mL (ref 0.350–4.500)

## 2022-10-13 MED FILL — Nivolumab IV Soln 240 MG/24ML: INTRAVENOUS | Qty: 48 | Status: AC

## 2022-10-13 NOTE — Telephone Encounter (Signed)
Patient has been scheduled for follow-up visit per 10/13/22 LOS.  Pt given an appt calendar with date and time.

## 2022-10-14 ENCOUNTER — Other Ambulatory Visit: Payer: Self-pay

## 2022-10-14 LAB — T4: T4, Total: 8.1 ug/dL (ref 4.5–12.0)

## 2022-10-16 ENCOUNTER — Inpatient Hospital Stay: Payer: Medicare HMO

## 2022-10-16 ENCOUNTER — Other Ambulatory Visit: Payer: Self-pay | Admitting: Oncology

## 2022-10-16 VITALS — BP 157/73 | HR 78 | Temp 97.5°F | Resp 20 | Ht 69.69 in | Wt 234.0 lb

## 2022-10-16 DIAGNOSIS — C439 Malignant melanoma of skin, unspecified: Secondary | ICD-10-CM

## 2022-10-16 DIAGNOSIS — C4372 Malignant melanoma of left lower limb, including hip: Secondary | ICD-10-CM | POA: Diagnosis not present

## 2022-10-16 DIAGNOSIS — Z5112 Encounter for antineoplastic immunotherapy: Secondary | ICD-10-CM | POA: Diagnosis not present

## 2022-10-16 DIAGNOSIS — J019 Acute sinusitis, unspecified: Secondary | ICD-10-CM

## 2022-10-16 DIAGNOSIS — Z79899 Other long term (current) drug therapy: Secondary | ICD-10-CM | POA: Diagnosis not present

## 2022-10-16 DIAGNOSIS — C792 Secondary malignant neoplasm of skin: Secondary | ICD-10-CM

## 2022-10-16 MED ORDER — SODIUM CHLORIDE 0.9 % IV SOLN
480.0000 mg | Freq: Once | INTRAVENOUS | Status: AC
  Administered 2022-10-16: 480 mg via INTRAVENOUS
  Filled 2022-10-16: qty 48

## 2022-10-16 MED ORDER — SODIUM CHLORIDE 0.9% FLUSH
10.0000 mL | INTRAVENOUS | Status: DC | PRN
  Administered 2022-10-16: 10 mL

## 2022-10-16 MED ORDER — AMOXICILLIN-POT CLAVULANATE 875-125 MG PO TABS: 1.0000 | ORAL_TABLET | Freq: Two times a day (BID) | ORAL | 0 refills | Status: AC

## 2022-10-16 MED ORDER — HEPARIN SOD (PORK) LOCK FLUSH 100 UNIT/ML IV SOLN
500.0000 [IU] | Freq: Once | INTRAVENOUS | Status: AC | PRN
  Administered 2022-10-16: 500 [IU]

## 2022-10-16 MED ORDER — SODIUM CHLORIDE 0.9 % IV SOLN: Freq: Once | INTRAVENOUS | Status: AC

## 2022-10-16 NOTE — Progress Notes (Signed)
Dr Hinton Rao notified that pt saw her Friday and had c/o allergy syptoms but nasal drainage was clear at the time.  Over the weekend it turned yellow/green. Dr Hinton Rao will call in Augmentin bid x 10 days to his usual pharm. Pt was warned that sometimes you can have loose bowels with augmentin.  Pt verbalizes understanding.

## 2022-10-16 NOTE — Patient Instructions (Signed)
Nivolumab Injection What is this medication? NIVOLUMAB (nye VOL ue mab) treats some types of cancer. It works by helping your immune system slow or stop the spread of cancer cells. It is a monoclonal antibody. This medicine may be used for other purposes; ask your health care provider or pharmacist if you have questions. COMMON BRAND NAME(S): Opdivo What should I tell my care team before I take this medication? They need to know if you have any of these conditions: Allogeneic stem cell transplant (uses someone else's stem cells) Autoimmune diseases, such as Crohn disease, ulcerative colitis, lupus History of chest radiation Nervous system problems, such as Guillain-Barre syndrome or myasthenia gravis Organ transplant An unusual or allergic reaction to nivolumab, other medications, foods, dyes, or preservatives Pregnant or trying to get pregnant Breast-feeding How should I use this medication? This medication is infused into a vein. It is given in a hospital or clinic setting. A special MedGuide will be given to you before each treatment. Be sure to read this information carefully each time. Talk to your care team about the use of this medication in children. While it may be prescribed for children as young as 12 years for selected conditions, precautions do apply. Overdosage: If you think you have taken too much of this medicine contact a poison control center or emergency room at once. NOTE: This medicine is only for you. Do not share this medicine with others. What if I miss a dose? Keep appointments for follow-up doses. It is important not to miss your dose. Call your care team if you are unable to keep an appointment. What may interact with this medication? Interactions have not been studied. This list may not describe all possible interactions. Give your health care provider a list of all the medicines, herbs, non-prescription drugs, or dietary supplements you use. Also tell them if you  smoke, drink alcohol, or use illegal drugs. Some items may interact with your medicine. What should I watch for while using this medication? Your condition will be monitored carefully while you are receiving this medication. You may need blood work while taking this medication. This medication may cause serious skin reactions. They can happen weeks to months after starting the medication. Contact your care team right away if you notice fevers or flu-like symptoms with a rash. The rash may be red or purple and then turn into blisters or peeling of the skin. You may also notice a red rash with swelling of the face, lips, or lymph nodes in your neck or under your arms. Tell your care team right away if you have any change in your eyesight. Talk to your care team if you are pregnant or think you might be pregnant. A negative pregnancy test is required before starting this medication. A reliable form of contraception is recommended while taking this medication and for 5 months after the last dose. Talk to your care team about effective forms of contraception. Do not breast-feed while taking this medication and for 5 months after the last dose. What side effects may I notice from receiving this medication? Side effects that you should report to your care team as soon as possible: Allergic reactions--skin rash, itching, hives, swelling of the face, lips, tongue, or throat Dry cough, shortness of breath or trouble breathing Eye pain, redness, irritation, or discharge with blurry or decreased vision Heart muscle inflammation--unusual weakness or fatigue, shortness of breath, chest pain, fast or irregular heartbeat, dizziness, swelling of the ankles, feet, or hands Hormone  gland problems--headache, sensitivity to light, unusual weakness or fatigue, dizziness, fast or irregular heartbeat, increased sensitivity to cold or heat, excessive sweating, constipation, hair loss, increased thirst or amount of urine,  tremors or shaking, irritability Infusion reactions--chest pain, shortness of breath or trouble breathing, feeling faint or lightheaded Kidney injury (glomerulonephritis)--decrease in the amount of urine, red or dark brown urine, foamy or bubbly urine, swelling of the ankles, hands, or feet Liver injury--right upper belly pain, loss of appetite, nausea, light-colored stool, dark yellow or brown urine, yellowing skin or eyes, unusual weakness or fatigue Pain, tingling, or numbness in the hands or feet, muscle weakness, change in vision, confusion or trouble speaking, loss of balance or coordination, trouble walking, seizures Rash, fever, and swollen lymph nodes Redness, blistering, peeling, or loosening of the skin, including inside the mouth Sudden or severe stomach pain, bloody diarrhea, fever, nausea, vomiting Side effects that usually do not require medical attention (report these to your care team if they continue or are bothersome): Bone, joint, or muscle pain Diarrhea Fatigue Loss of appetite Nausea Skin rash This list may not describe all possible side effects. Call your doctor for medical advice about side effects. You may report side effects to FDA at 1-800-FDA-1088. Where should I keep my medication? This medication is given in a hospital or clinic. It will not be stored at home. NOTE: This sheet is a summary. It may not cover all possible information. If you have questions about this medicine, talk to your doctor, pharmacist, or health care provider.  2023 [employer] Standard (2014-06-29 00:00:00)

## 2022-10-27 ENCOUNTER — Encounter: Payer: Self-pay | Admitting: Oncology

## 2022-11-01 ENCOUNTER — Encounter: Payer: Self-pay | Admitting: Oncology

## 2022-11-03 ENCOUNTER — Other Ambulatory Visit: Payer: Self-pay

## 2022-11-03 DIAGNOSIS — C439 Malignant melanoma of skin, unspecified: Secondary | ICD-10-CM

## 2022-11-03 DIAGNOSIS — K219 Gastro-esophageal reflux disease without esophagitis: Secondary | ICD-10-CM

## 2022-11-03 MED ORDER — TRAZODONE HCL 50 MG PO TABS: 50.0000 mg | ORAL_TABLET | Freq: Every evening | ORAL | 5 refills | Status: DC | PRN

## 2022-11-03 MED ORDER — FAMOTIDINE 40 MG PO TABS: 40.0000 mg | ORAL_TABLET | Freq: Every day | ORAL | 5 refills | Status: DC

## 2022-11-09 ENCOUNTER — Inpatient Hospital Stay (INDEPENDENT_AMBULATORY_CARE_PROVIDER_SITE_OTHER): Payer: Medicare HMO | Admitting: Oncology

## 2022-11-09 ENCOUNTER — Encounter: Payer: Self-pay | Admitting: Oncology

## 2022-11-09 ENCOUNTER — Inpatient Hospital Stay: Payer: Medicare HMO | Attending: Oncology

## 2022-11-09 VITALS — BP 150/77 | HR 87 | Temp 98.3°F | Resp 16 | Ht 69.69 in | Wt 231.9 lb

## 2022-11-09 DIAGNOSIS — R918 Other nonspecific abnormal finding of lung field: Secondary | ICD-10-CM

## 2022-11-09 DIAGNOSIS — C4372 Malignant melanoma of left lower limb, including hip: Secondary | ICD-10-CM | POA: Insufficient documentation

## 2022-11-09 DIAGNOSIS — C439 Malignant melanoma of skin, unspecified: Secondary | ICD-10-CM

## 2022-11-09 DIAGNOSIS — Z5112 Encounter for antineoplastic immunotherapy: Secondary | ICD-10-CM | POA: Insufficient documentation

## 2022-11-09 DIAGNOSIS — Z79899 Other long term (current) drug therapy: Secondary | ICD-10-CM | POA: Diagnosis not present

## 2022-11-09 DIAGNOSIS — C792 Secondary malignant neoplasm of skin: Secondary | ICD-10-CM

## 2022-11-09 LAB — CBC WITH DIFFERENTIAL (CANCER CENTER ONLY)
Abs Immature Granulocytes: 0.07 10*3/uL (ref 0.00–0.07)
Basophils Absolute: 0.1 10*3/uL (ref 0.0–0.1)
Basophils Relative: 0 %
Eosinophils Absolute: 0.8 10*3/uL — ABNORMAL HIGH (ref 0.0–0.5)
Eosinophils Relative: 5 %
HCT: 43.7 % (ref 39.0–52.0)
Hemoglobin: 14.4 g/dL (ref 13.0–17.0)
Immature Granulocytes: 0 %
Lymphocytes Relative: 23 %
Lymphs Abs: 4.1 10*3/uL — ABNORMAL HIGH (ref 0.7–4.0)
MCH: 28.6 pg (ref 26.0–34.0)
MCHC: 33 g/dL (ref 30.0–36.0)
MCV: 86.9 fL (ref 80.0–100.0)
Monocytes Absolute: 1.4 10*3/uL — ABNORMAL HIGH (ref 0.1–1.0)
Monocytes Relative: 8 %
Neutro Abs: 11.6 10*3/uL — ABNORMAL HIGH (ref 1.7–7.7)
Neutrophils Relative %: 64 %
Platelet Count: 293 10*3/uL (ref 150–400)
RBC: 5.03 MIL/uL (ref 4.22–5.81)
RDW: 13.6 % (ref 11.5–15.5)
WBC Count: 18 10*3/uL — ABNORMAL HIGH (ref 4.0–10.5)
nRBC: 0 % (ref 0.0–0.2)

## 2022-11-09 LAB — CMP (CANCER CENTER ONLY)
ALT: 22 U/L (ref 0–44)
AST: 23 U/L (ref 15–41)
Albumin: 4.5 g/dL (ref 3.5–5.0)
Alkaline Phosphatase: 86 U/L (ref 38–126)
Anion gap: 7 (ref 5–15)
BUN: 22 mg/dL (ref 8–23)
CO2: 22 mmol/L (ref 22–32)
Calcium: 8.9 mg/dL (ref 8.9–10.3)
Chloride: 104 mmol/L (ref 98–111)
Creatinine: 1.17 mg/dL (ref 0.61–1.24)
GFR, Estimated: >60 mL/min (ref >60)
Glucose, Bld: 139 mg/dL — ABNORMAL HIGH (ref 70–99)
Potassium: 3.7 mmol/L (ref 3.5–5.1)
Sodium: 133 mmol/L — ABNORMAL LOW (ref 135–145)
Total Bilirubin: 1 mg/dL (ref 0.3–1.2)
Total Protein: 7.5 g/dL (ref 6.5–8.1)

## 2022-11-09 LAB — TSH: TSH: 1.49 u[IU]/mL (ref 0.350–4.500)

## 2022-11-09 NOTE — Progress Notes (Signed)
[facility]  [address] [place],  [place]  [address] [phone]  Clinic Day: 11/09/22    Referring physician: [name], MD  ASSESSMENT & PLAN:   Malignant melanoma of the left foot He had a punch biopsy and this reveals a Breslow thickness of 1.6 mm and Clark's level 4 with ulceration for a T2b N0 M0 clinically.  We found no distant metastasis on PET scan.  His tumor is positive for BRAF mutation. He had this treated with wide local excision.   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   Multiple lesions of the lung These were suspicious for metastatic disease but have now resolved on the CT scan on 08/12/22.  The PET scan was negative but we cannot absolutely rule out metastatic disease since these lesions are small. We discussed the fact that the resolution of these lesions could represent a response to treatment, or may not have been malignant at all. This would affect the duration of his immunotherapy.   Spinal stenosis at L4-5 He has had surgical decompression and resection of a synovial cyst as of January 26 2022.  Leukocytosis He has had chronic leukocytosis and no symptoms of infection and urinalysis and urine culture were negative.  We did not find an explanation but the [facility] continue to fluctuate up and down.  Plan: He had repeat scans recently which were clear. We discussed repeating scans in 3-6 months. His labs today are pending. He will receive his treatment next week. I will see him back in 4 weeks with CBC and CMP. The patient was provided an opportunity to ask questions and all were answered.  The patient agreed with the plan and demonstrated an understanding of the instructions.  The patient was advised to call back if the symptoms worsen or if the  condition fails to improve as anticipated.  I provided 15 minutes of face-to-face time during this this encounter and > 50% was spent counseling as documented under my assessment and plan.   [name], MD [facility] AT [facility] [address] [place] [address] Dept: [phone] Dept Fax: [phone]   CHIEF COMPLAINT:  CC: Malignant melanoma  Current Treatment: Evaluation and immunotherapy   HISTORY OF PRESENT ILLNESS:  [name] is a 77 y.o. male with a history of malignant melanoma who is referred in consultation with Dr. Luna Kitchens for assessment and management.  He had a lesion of the dorsum of the left foot in September 2022 which appeared to be a granuloma and was treated in the office.  He later developed a another lesion lateral to this on the dorsum of the left foot which was not hyperpigmented but did bleed easily.  He also has several lesions of his left medial calf which are nodular and span approximately 6 cm.  He has had some increased edema of the left lower leg.  He was evaluated by dermatology on June 5, who felt this could be a squamous cell carcinoma versus tinea or a drug reaction.  He had biopsies done of both the foot lesion and the Lesion.  Pathology came back that the left dorsal foot shave biopsy revealed a malignant melanoma possibly a primary nodular ulcerated melanoma which had a Breslow thickness of at  least 1.6 mm and a Clark's level 4.  Margins are positive as this was a shave biopsy.  Ulceration is present but no satellitosis.  The mitotic index is 4/mm and no lymphovascular invasion was identified.  No brisk tumor infiltrating lymphocytes are seen and tumor regression is absent.  This is felt to be at least a T2b lesion and wide excision was recommended.  The lesion of the left medial calf revealed a dermal map melanoma most consistent with metastatic disease.  This information was not  available when the patient was admitted for back surgery on June 8 and he had a microlumbar decompression at L4-5 for spinal stenosis.  He was also found to have a synovial cyst at that time which was resected and benign.  He was readmitted several days later for acute pain of the right elbow and both arms.  Aspiration was obtained and this was diagnosed as gouty arthritis.  He did have temporary rehab after his back surgery.  His left leg was evaluated for deep venous thrombosis and was negative.  While he was recuperating from his surgery he had an x-ray which was abnormal which led to a CT scan which revealed at least 3 lung lesions.  MRI of the brain was negative and he is now referred for management of the malignant melanoma.  Oncology History  Malignant melanoma, metastatic (HCC)  01/23/2022 Cancer Staging   Staging form: Melanoma of the Skin, AJCC 8th Edition - Clinical stage from 01/23/2022: Stage III (cT2b, cN1c, cM0) - Signed by [name], MD on 04/07/2022 Histopathologic type: Malignant melanoma, NOS (except juvenile melanoma M-8770/[id number]) Stage prefix: Initial diagnosis Laterality: Left Lymph-vascular invasion (LVI): LVI not present (absent)/not identified Diagnostic confirmation: Positive histology Specimen type: Biopsy / Limited Resection Staged by: Managing physician Mitotic count: 4 Mitotic unit: mm2 Clark's level: Level IV Tumor-infiltrating lymphocytes: Present and non-brisk Breslow depth (mm): 16 Ulceration of the epidermis: Yes Microsatellites: No Primary tumor regression: Absent Intransit/satellite metastasis: In-transit Lymph node clinically or radiologically detected: No Microscopic confirmation of metastasis: No Sentinel lymph node biopsy performed: No Completion or therapeutic lymph node dissection performed: No Matted nodes: No Stage used in treatment planning: Yes National guidelines used in treatment planning: Yes Type of national guideline used in  treatment planning: NCCN   01/31/2022 Initial Diagnosis   Malignant melanoma, metastatic (HCC)   04/14/2022 -  Chemotherapy   Patient is on Treatment Plan : MELANOMA Nivolumab (480) q28d (changed from q14d on 12/4)     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)  02/28/2022 Cancer Staging   Staging form: Melanoma of the Skin, AJCC 8th Edition - Clinical stage from 02/28/2022: Stage IV (cT2b(m), cN1c, cM1a) - Signed by [name], MD on 04/07/2022 Histopathologic type: Malignant melanoma, NOS (except juvenile melanoma M-8770/[id number]) Stage prefix: Initial diagnosis Laterality: Left Multiple tumors: Yes Lymph-vascular invasion (LVI): LVI present/identified, NOS Diagnostic confirmation: Positive histology Specimen type: Excision Staged by: Managing physician Intransit/satellite metastasis: Both in-transit and satellite Prognostic indicators: 02/28/22:  Wide excision of primary of foot - no residual melanoma,  Excision of skin of calf consistent with intransit mets of dermal/subcuticular   Melanoma with LVI, metastatic disease with multifocal lesions and multifocal positive margins 03/24/22: Re-excision with a few atypical melanocytes, no melanoma and clear margins   04/07/2022 Initial Diagnosis   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 He states that he has congestion and a cough. He notes he did have yellow phlegm but is now clear after a course of antibiotics. He states it may be from allergies. I have suggested that he can take nasal spray if needed. He denies signs of infection such as sore throat, or urinary symptoms.  He denies fevers or recurrent chills. He denies pain. He denies nausea, vomiting, chest pain, or dyspnea. His weight has lost 3 pounds since his last  visit.   HISTORY:   Past Medical History:  Diagnosis Date   Arthritis    gouty arthritis June 2023   History of colon polyps    History of kidney stones    Hypercholesteremia    Hypertension    Leukocytosis 06/16/2022   Malignant melanoma (HCC)    of the left foot with in-transit metastases   Peptic ulcer    Spinal stenosis at L4-L5 level    Stroke 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 , 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    Past Surgical History:  Procedure Laterality Date   LUMBAR LAMINECTOMY/DECOMPRESSION MICRODISCECTOMY N/A 01/26/2022   Procedure: Microlumbar decompression Lumbar four-five, lateral mass fusion with autograft and allograft bone;  Surgeon: Jene Every, MD;  Location: [facility] OR;  Service: Orthopedics;  Laterality: N/A;   PARTIAL GASTRECTOMY     TOTAL HIP ARTHROPLASTY Right 06/18/2018   Procedure: RIGHT TOTAL HIP ARTHROPLASTY ANTERIOR APPROACH;  Surgeon: Durene Romans, MD;  Location: [facility] ORS;  Service: Orthopedics;  Laterality: Right;    Family History  Problem Relation Age of Onset   Lung cancer Sister    Melanoma Brother     Social History:  reports that he has quit smoking. He has never used smokeless tobacco. He reports that he does not currently use alcohol. He reports that he does not use drugs.    Allergies:  Allergies  Allergen Reactions   Morphine Itching    Patient states it is tolerable itching    Current Medications: Current Outpatient Medications  Medication Sig Dispense Refill   amLODipine (NORVASC) 5 MG tablet Take 5 mg by mouth daily.     clopidogrel (PLAVIX) 75 MG tablet Take 1 tablet by mouth daily.     docusate sodium (COLACE) 100 MG capsule 1 capsule 2 TIMES DAILY (route: oral)     famotidine (PEPCID) 40 MG tablet Take 1 tablet (40 mg total) by mouth daily. 30 tablet 5   Flaxseed, Linseed, (FLAXSEED OIL) 1000 MG CAPS Take 1,000 mg by mouth daily.     folic acid (FOLVITE) 400 MCG tablet Take 400 mcg by mouth daily.      gabapentin (NEURONTIN) 300 MG capsule Take 1 capsule (300 mg total) by mouth 3 (three) times daily. 90 capsule 2   hydrochlorothiazide (HYDRODIURIL) 12.5 MG tablet Take 12.5 mg by mouth daily.     lisinopril (ZESTRIL) 20 MG tablet      Multiple Vitamin (MULTIVITAMIN WITH MINERALS) TABS tablet Take 1 tablet by mouth in the morning.     mupirocin ointment (BACTROBAN) 2 % Apply topically.     Omega-3 1000 MG CAPS Take by mouth.     ondansetron (ZOFRAN) 4 MG tablet Take 1 tablet (4 mg total) by mouth every 4 (four) hours as needed for nausea. 90 tablet 3   oxyCODONE (OXY IR/ROXICODONE) 5 MG immediate release tablet Take 1 tablet (5  mg total) by mouth every 6 (six) hours as needed for severe pain. 30 tablet 0   polyethylene glycol (MIRALAX / GLYCOLAX) 17 g packet Take 17 g by mouth daily. 14 each 0   polyethylene glycol powder (GLYCOLAX/MIRALAX) 17 GM/SCOOP powder SMARTSIG:17 Gram(s) By Mouth Daily PRN     pravastatin (PRAVACHOL) 40 MG tablet Take 40 mg by mouth daily.     prochlorperazine (COMPAZINE) 10 MG tablet Take 1 tablet (10 mg total) by mouth every 6 (six) hours as needed for nausea or vomiting. 90 tablet 3   traZODone (DESYREL) 50 MG tablet Take 1 tablet (50 mg total) by mouth at bedtime as needed for sleep. 30 tablet 5   No current facility-administered medications for this visit.    REVIEW OF SYSTEMS:  Review of Systems  Constitutional: Negative.  Negative for appetite change, chills, diaphoresis, fatigue, fever and unexpected weight change.  HENT:  Negative.  Negative for hearing loss, lump/mass, mouth sores, nosebleeds, sore throat, tinnitus, trouble swallowing and voice change.        Sinus drainage.  Eyes: Negative.  Negative for eye problems and icterus.  Respiratory: Negative.  Negative for chest tightness, cough, hemoptysis, shortness of breath and wheezing.   Cardiovascular:  Positive for leg swelling (LLE). Negative for chest pain and palpitations.  Gastrointestinal:  Negative.  Negative for abdominal distention, abdominal pain, blood in stool, constipation, diarrhea, nausea, rectal pain and vomiting.  Endocrine: Negative.  Negative for hot flashes.  Genitourinary: Negative.  Negative for bladder incontinence, difficulty urinating, dyspareunia, dysuria, frequency, hematuria, nocturia, pelvic pain and penile discharge.   Musculoskeletal: Negative.  Negative for arthralgias, back pain, flank pain, gait problem, myalgias, neck pain and neck stiffness.  Skin: Negative.  Negative for itching, rash and wound.       Well healed scar below the knee.  Neurological: Negative.  Negative for dizziness, extremity weakness, gait problem, headaches, light-headedness, numbness, seizures and speech difficulty.  Hematological: Negative.  Negative for adenopathy. Does not bruise/bleed easily.  Psychiatric/Behavioral: Negative.  Negative for confusion, decreased concentration, depression, sleep disturbance and suicidal ideas. The patient is not nervous/anxious.      VITALS:  Blood pressure (!) 150/77, pulse 87, temperature 98.3 F (36.8 C), temperature source Oral, resp. rate 16, height 5' 9.69" (1.77 m), weight 231 lb 14.4 oz (105.2 kg), SpO2 97 %.  Wt Readings from Last 3 Encounters:  11/13/22 233 lb 0.6 oz (105.7 kg)  11/09/22 231 lb 14.4 oz (105.2 kg)  10/16/22 234 lb (106.1 kg)    Body mass index is 33.57 kg/m.  Performance status (ECOG): 1 - Symptomatic but completely ambulatory  PHYSICAL EXAM:  Physical Exam Vitals and nursing note reviewed.  Constitutional:      General: He is not in acute distress.    Appearance: Normal appearance. He is normal weight. He is not ill-appearing, toxic-appearing or diaphoretic.  HENT:     Head: Normocephalic and atraumatic.     Right Ear: Tympanic membrane, ear canal and external ear normal. There is no impacted cerumen.     Left Ear: Tympanic membrane, ear canal and external ear normal. There is no impacted cerumen.     Nose:  Nose normal. No congestion or rhinorrhea.     Mouth/Throat:     Mouth: Mucous membranes are moist.     Pharynx: Oropharynx is clear. No oropharyngeal exudate or posterior oropharyngeal erythema.  Eyes:     General: No scleral icterus.       Right eye:  No discharge.        Left eye: No discharge.     Extraocular Movements: Extraocular movements intact.     Conjunctiva/sclera: Conjunctivae normal.     Pupils: Pupils are equal, round, and reactive to light.  Neck:     Vascular: No carotid bruit.  Cardiovascular:     Rate and Rhythm: Normal rate and regular rhythm.     Pulses: Normal pulses.     Heart sounds: Normal heart sounds. No murmur heard.    No friction rub. No gallop.  Pulmonary:     Effort: Pulmonary effort is normal. No respiratory distress.     Breath sounds: Normal breath sounds. No stridor. No wheezing, rhonchi or rales.  Chest:     Chest wall: No tenderness.  Abdominal:     General: Bowel sounds are normal. There is no distension.     Palpations: Abdomen is soft. There is no hepatomegaly, splenomegaly or mass.     Tenderness: There is no abdominal tenderness. There is no right CVA tenderness, left CVA tenderness, guarding or rebound.     Hernia: No hernia is present.  Musculoskeletal:        General: No swelling, tenderness, deformity or signs of injury. Normal range of motion.     Cervical back: Normal range of motion and neck supple. No rigidity or tenderness.     Right lower leg: No edema.     Left lower leg: 2+ Edema present.     Comments: 2+ edema of the left lower extremity. Firm scar, medial left knee, no evidence of recurrence.   Feet:     Comments: Left foot, lateral dorsal aspect has a raised firm scar which appears benign.  Lymphadenopathy:     Cervical: No cervical adenopathy.     Upper Body:     Right upper body: No supraclavicular or axillary adenopathy.     Left upper body: No supraclavicular or axillary adenopathy.     Lower Body: No right inguinal  adenopathy. No left inguinal adenopathy.  Skin:    General: Skin is warm and dry.     Coloration: Skin is not jaundiced or pale.     Findings: No bruising, erythema, lesion or rash.  Neurological:     General: No focal deficit present.     Mental Status: He is alert and oriented to person, place, and time. Mental status is at baseline.     Cranial Nerves: No cranial nerve deficit.     Sensory: No sensory deficit.     Motor: No weakness.     Coordination: Coordination normal.     Gait: Gait normal.     Deep Tendon Reflexes: Reflexes normal.  Psychiatric:        Mood and Affect: Mood normal.        Behavior: Behavior normal.        Thought Content: Thought content normal.        Judgment: Judgment normal.     LABS:      Latest Ref Rng & Units 11/09/2022    1:26 PM 10/13/2022   12:00 AM 09/15/2022   10:15 AM  CBC  WBC 4.0 - 10.5 K/uL 18.0  16.6     15.6   Hemoglobin 13.0 - 17.0 g/dL 31.5  17.6     16.0   Hematocrit 39.0 - 52.0 % 43.7  41     44.9   Platelets 150 - 400 K/uL 293  261     246  This result is from an external source.      Latest Ref Rng & Units 11/09/2022    1:26 PM 10/13/2022   12:00 AM 09/15/2022   10:15 AM  CMP  Glucose 70 - 99 mg/dL 811   95   BUN 8 - 23 mg/dL 22  19     31    Creatinine 0.61 - 1.24 mg/dL 9.14  0.9     7.82   Sodium 135 - 145 mmol/L 133  139     136   Potassium 3.5 - 5.1 mmol/L 3.7  4.3     4.2   Chloride 98 - 111 mmol/L 104  105     101   CO2 22 - 32 mmol/L 22  25     21    Calcium 8.9 - 10.3 mg/dL 8.9  9.0     9.0   Total Protein 6.5 - 8.1 g/dL 7.5   7.1   Total Bilirubin 0.3 - 1.2 mg/dL 1.0   1.0   Alkaline Phos 38 - 126 U/L 86  95     72   AST 15 - 41 U/L 23  111     23   ALT 0 - 44 U/L 22  22     21       This result is from an external source.   Component Ref Range & Units 2 d ago (08/16/22) 4 wk ago (07/20/22) 1 mo ago (07/07/22) 2 mo ago (06/16/22) 2 mo ago (05/26/22)  Glucose  104 131 High  R, CM 124 High  R, CM 96 105      No results found for: "CEA1", "CEA" / No results found for: "CEA1", "CEA" No results found for: "PSA1" No results found for: "NFA213" No results found for: "CAN125"  No results found for: "TOTALPROTELP", "ALBUMINELP", "A1GS", "A2GS", "BETS", "BETA2SER", "GAMS", "MSPIKE", "SPEI" No results found for: "TIBC", "FERRITIN", "IRONPCTSAT" Lab Results  Component Value Date   LDH 160 02/07/2022    STUDIES:  No results found.  Exam: 08/16/22 CT Chest, Abdomen, and Pelvis with contrast Chest Impressions: The 2 dominant nodules in the RIGHT lung previously documented are not readily identified; however, there is a new branching nodularity/consolidation in RIGHT middle lobe. RIGHT middle lobe findings favor infectious or inflammatory process. No metastatic mediastinal adenopathy identified No cutaneous lesion.  Abdomen / Pelvis Impression: No evidence of metastatic disease in the abdomen pelvis. Benign Bosniak 1 renal cysts. No follow-up recommended. No lymphadenopathy or peritoneal metastasis.          I,Gabriella Ballesteros,acting as a scribe for [name], MD.,have documented all relevant documentation on the behalf of [name], MD,as directed by  [name], MD while in the presence of [name], MD.    I have reviewed this report as typed by the medical scribe, and it is complete and accurate.  [name]   12/03/22 10:40 AM

## 2022-11-10 ENCOUNTER — Ambulatory Visit: Payer: Medicare HMO | Admitting: Oncology

## 2022-11-10 ENCOUNTER — Other Ambulatory Visit: Payer: Medicare HMO

## 2022-11-10 MED FILL — Nivolumab IV Soln 240 MG/24ML: INTRAVENOUS | Qty: 48 | Status: AC

## 2022-11-11 LAB — T4: T4, Total: 9.3 ug/dL (ref 4.5–12.0)

## 2022-11-13 ENCOUNTER — Inpatient Hospital Stay: Payer: Medicare HMO

## 2022-11-13 VITALS — BP 156/70 | HR 79 | Temp 97.6°F | Resp 16 | Ht 69.69 in | Wt 233.0 lb

## 2022-11-13 DIAGNOSIS — Z5112 Encounter for antineoplastic immunotherapy: Secondary | ICD-10-CM | POA: Diagnosis not present

## 2022-11-13 DIAGNOSIS — C792 Secondary malignant neoplasm of skin: Secondary | ICD-10-CM

## 2022-11-13 DIAGNOSIS — C439 Malignant melanoma of skin, unspecified: Secondary | ICD-10-CM

## 2022-11-13 DIAGNOSIS — C4372 Malignant melanoma of left lower limb, including hip: Secondary | ICD-10-CM | POA: Diagnosis not present

## 2022-11-13 DIAGNOSIS — Z79899 Other long term (current) drug therapy: Secondary | ICD-10-CM | POA: Diagnosis not present

## 2022-11-13 MED ORDER — HEPARIN SOD (PORK) LOCK FLUSH 100 UNIT/ML IV SOLN
500.0000 [IU] | Freq: Once | INTRAVENOUS | Status: AC | PRN
  Administered 2022-11-13: 500 [IU]

## 2022-11-13 MED ORDER — SODIUM CHLORIDE 0.9 % IV SOLN
480.0000 mg | Freq: Once | INTRAVENOUS | Status: AC
  Administered 2022-11-13: 480 mg via INTRAVENOUS
  Filled 2022-11-13: qty 48

## 2022-11-13 MED ORDER — SODIUM CHLORIDE 0.9 % IV SOLN: Freq: Once | INTRAVENOUS | Status: AC

## 2022-11-13 MED ORDER — SODIUM CHLORIDE 0.9% FLUSH
10.0000 mL | INTRAVENOUS | Status: DC | PRN
  Administered 2022-11-13: 10 mL

## 2022-11-13 NOTE — Patient Instructions (Signed)
Nivolumab Injection What is this medication? NIVOLUMAB (nye VOL ue mab) treats some types of cancer. It works by helping your immune system slow or stop the spread of cancer cells. It is a monoclonal antibody. This medicine may be used for other purposes; ask your health care provider or pharmacist if you have questions. COMMON BRAND NAME(S): Opdivo What should I tell my care team before I take this medication? They need to know if you have any of these conditions: Allogeneic stem cell transplant (uses someone else's stem cells) Autoimmune diseases, such as Crohn disease, ulcerative colitis, lupus History of chest radiation Nervous system problems, such as Guillain-Barre syndrome or myasthenia gravis Organ transplant An unusual or allergic reaction to nivolumab, other medications, foods, dyes, or preservatives Pregnant or trying to get pregnant Breast-feeding How should I use this medication? This medication is infused into a vein. It is given in a hospital or clinic setting. A special MedGuide will be given to you before each treatment. Be sure to read this information carefully each time. Talk to your care team about the use of this medication in children. While it may be prescribed for children as young as 12 years for selected conditions, precautions do apply. Overdosage: If you think you have taken too much of this medicine contact a poison control center or emergency room at once. NOTE: This medicine is only for you. Do not share this medicine with others. What if I miss a dose? Keep appointments for follow-up doses. It is important not to miss your dose. Call your care team if you are unable to keep an appointment. What may interact with this medication? Interactions have not been studied. This list may not describe all possible interactions. Give your health care provider a list of all the medicines, herbs, non-prescription drugs, or dietary supplements you use. Also tell them if you  smoke, drink alcohol, or use illegal drugs. Some items may interact with your medicine. What should I watch for while using this medication? Your condition will be monitored carefully while you are receiving this medication. You may need blood work while taking this medication. This medication may cause serious skin reactions. They can happen weeks to months after starting the medication. Contact your care team right away if you notice fevers or flu-like symptoms with a rash. The rash may be red or purple and then turn into blisters or peeling of the skin. You may also notice a red rash with swelling of the face, lips, or lymph nodes in your neck or under your arms. Tell your care team right away if you have any change in your eyesight. Talk to your care team if you are pregnant or think you might be pregnant. A negative pregnancy test is required before starting this medication. A reliable form of contraception is recommended while taking this medication and for 5 months after the last dose. Talk to your care team about effective forms of contraception. Do not breast-feed while taking this medication and for 5 months after the last dose. What side effects may I notice from receiving this medication? Side effects that you should report to your care team as soon as possible: Allergic reactions--skin rash, itching, hives, swelling of the face, lips, tongue, or throat Dry cough, shortness of breath or trouble breathing Eye pain, redness, irritation, or discharge with blurry or decreased vision Heart muscle inflammation--unusual weakness or fatigue, shortness of breath, chest pain, fast or irregular heartbeat, dizziness, swelling of the ankles, feet, or hands Hormone   gland problems--headache, sensitivity to light, unusual weakness or fatigue, dizziness, fast or irregular heartbeat, increased sensitivity to cold or heat, excessive sweating, constipation, hair loss, increased thirst or amount of urine,  tremors or shaking, irritability Infusion reactions--chest pain, shortness of breath or trouble breathing, feeling faint or lightheaded Kidney injury (glomerulonephritis)--decrease in the amount of urine, red or dark brown urine, foamy or bubbly urine, swelling of the ankles, hands, or feet Liver injury--right upper belly pain, loss of appetite, nausea, light-colored stool, dark yellow or brown urine, yellowing skin or eyes, unusual weakness or fatigue Pain, tingling, or numbness in the hands or feet, muscle weakness, change in vision, confusion or trouble speaking, loss of balance or coordination, trouble walking, seizures Rash, fever, and swollen lymph nodes Redness, blistering, peeling, or loosening of the skin, including inside the mouth Sudden or severe stomach pain, bloody diarrhea, fever, nausea, vomiting Side effects that usually do not require medical attention (report these to your care team if they continue or are bothersome): Bone, joint, or muscle pain Diarrhea Fatigue Loss of appetite Nausea Skin rash This list may not describe all possible side effects. Call your doctor for medical advice about side effects. You may report side effects to FDA at 1-800-FDA-1088. Where should I keep my medication? This medication is given in a hospital or clinic. It will not be stored at home. NOTE: This sheet is a summary. It may not cover all possible information. If you have questions about this medicine, talk to your doctor, pharmacist, or health care provider.  2023 [employer] Standard (2014-06-29 00:00:00)  

## 2022-11-15 DIAGNOSIS — Z6832 Body mass index (BMI) 32.0-32.9, adult: Secondary | ICD-10-CM | POA: Diagnosis not present

## 2022-11-15 DIAGNOSIS — J40 Bronchitis, not specified as acute or chronic: Secondary | ICD-10-CM | POA: Diagnosis not present

## 2022-11-15 DIAGNOSIS — J329 Chronic sinusitis, unspecified: Secondary | ICD-10-CM | POA: Diagnosis not present

## 2022-11-21 ENCOUNTER — Other Ambulatory Visit: Payer: Self-pay

## 2022-12-03 ENCOUNTER — Encounter: Payer: Self-pay | Admitting: Oncology

## 2022-12-06 NOTE — Progress Notes (Signed)
[facility]  [address] [place],  [place]  [address] [phone]  Clinic Day: 12/07/22  Referring physician: [name], MD  ASSESSMENT & PLAN:  Assessment: Malignant melanoma of the left foot He had a punch biopsy and this reveals a Breslow thickness of 1.6 mm and Clark's level 4 with ulceration for a T2b N0 M0 clinically.  We found no distant metastasis on PET scan.  His tumor is positive for BRAF mutation. He had this treated with wide local excision.   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   Multiple lesions of the lung These were suspicious for metastatic disease but have now resolved on the CT scan on 08/12/22.  The PET scan was negative but we cannot absolutely rule out metastatic disease since these lesions are small. We discussed the fact that the resolution of these lesions could represent a response to treatment, or may not have been malignant at all. This would affect the duration of his immunotherapy.   Spinal stenosis at L4-5 He has had surgical decompression and resection of a synovial cyst as of January 26 2022.  Leukocytosis He has had chronic leukocytosis and no symptoms of infection and urinalysis and urine culture were negative.  We did not find an explanation but the [facility] continue to fluctuate up and down.  Plan: He inquired if he can continue taking Nivolumab. I advised that it makes me nervous to stop, as the lesions of his left knee were metastatic and the lung lesions disappeared after treatment. He will continue it as normal for now. The difference is whether we are treating him for adjuvant immunotherapy or metastatic disease. There is no way to absolutely know about the lung lesions, but the skin lesions of the knee were clearly metastatic.  His  day 1 cycle 11 of Nivolumab is on 12/11/2022. His labs today are pending. I will see him back in 4 weeks with CBC and CMP. The patient was provided an opportunity to ask questions and all were answered.  The patient agreed with the plan and demonstrated an understanding of the instructions.  The patient was advised to call back if the symptoms worsen or if the condition fails to improve as anticipated.  I provided 20 minutes of face-to-face time during this this encounter and > 50% was spent counseling as documented under my assessment and plan.   [name], MD [facility] AT [facility] [address] [place] [address] Dept: [phone] Dept Fax: [phone]   CHIEF COMPLAINT:  CC: Malignant melanoma  Current Treatment: Evaluation and immunotherapy   HISTORY OF PRESENT ILLNESS:  [name] is a 77 y.o. male with a history of malignant melanoma who is referred in consultation with Dr. Luna Kitchens for assessment and management.  He had a lesion of the dorsum of the left foot in September 2022 which appeared to be a granuloma and was treated in the office.  He later developed a another lesion lateral to this on the dorsum of the left foot which was not hyperpigmented but did bleed easily.  He also has several lesions of his left medial calf which are nodular and span approximately 6 cm.  He has had some increased edema of the left lower leg.  He  was evaluated by dermatology on June 5, who felt this could be a squamous cell carcinoma versus tinea or a drug reaction.  He had biopsies done of both the foot lesion and the Lesion.  Pathology came back that the left dorsal foot shave biopsy revealed a malignant melanoma possibly a primary nodular ulcerated melanoma which had a Breslow thickness of at least 1.6 mm and a Clark's level 4.  Margins are positive as this was a shave biopsy.  Ulceration is present but no satellitosis.  The mitotic  index is 4/mm and no lymphovascular invasion was identified.  No brisk tumor infiltrating lymphocytes are seen and tumor regression is absent.  This is felt to be at least a T2b lesion and wide excision was recommended.  The lesion of the left medial calf revealed a dermal map melanoma most consistent with metastatic disease.  This information was not available when the patient was admitted for back surgery on June 8 and he had a microlumbar decompression at L4-5 for spinal stenosis.  He was also found to have a synovial cyst at that time which was resected and benign.  He was readmitted several days later for acute pain of the right elbow and both arms.  Aspiration was obtained and this was diagnosed as gouty arthritis.  He did have temporary rehab after his back surgery.  His left leg was evaluated for deep venous thrombosis and was negative.  While he was recuperating from his surgery he had an x-ray which was abnormal which led to a CT scan which revealed at least 3 lung lesions.  MRI of the brain was negative and he is now referred for management of the malignant melanoma.  Oncology History  Malignant melanoma, metastatic (HCC)  01/23/2022 Cancer Staging   Staging form: Melanoma of the Skin, AJCC 8th Edition - Clinical stage from 01/23/2022: Stage III (cT2b, cN1c, cM0) - Signed by [name], MD on 04/07/2022 Histopathologic type: Malignant melanoma, NOS (except juvenile melanoma M-8770/[id number]) Stage prefix: Initial diagnosis Laterality: Left Lymph-vascular invasion (LVI): LVI not present (absent)/not identified Diagnostic confirmation: Positive histology Specimen type: Biopsy / Limited Resection Staged by: Managing physician Mitotic count: 4 Mitotic unit: mm2 Clark's level: Level IV Tumor-infiltrating lymphocytes: Present and non-brisk Breslow depth (mm): 16 Ulceration of the epidermis: Yes Microsatellites: No Primary tumor regression: Absent Intransit/satellite metastasis:  In-transit Lymph node clinically or radiologically detected: No Microscopic confirmation of metastasis: No Sentinel lymph node biopsy performed: No Completion or therapeutic lymph node dissection performed: No Matted nodes: No Stage used in treatment planning: Yes National guidelines used in treatment planning: Yes Type of national guideline used in treatment planning: NCCN   01/31/2022 Initial Diagnosis   Malignant melanoma, metastatic (HCC)   04/14/2022 -  Chemotherapy   Patient is on Treatment Plan : MELANOMA Nivolumab (480) q28d (changed from q14d on 12/4)     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)  02/28/2022 Cancer Staging   Staging form: Melanoma of the Skin, AJCC 8th Edition - Clinical stage from 02/28/2022: Stage IV (cT2b(m), cN1c, cM1a) - Signed by [name], MD on 04/07/2022 Histopathologic type: Malignant melanoma, NOS (except juvenile melanoma M-8770/[id number]) Stage prefix: Initial diagnosis Laterality: Left Multiple tumors: Yes Lymph-vascular invasion (LVI): LVI present/identified, NOS Diagnostic confirmation: Positive histology Specimen type: Excision Staged by: Managing physician Intransit/satellite metastasis: Both in-transit and satellite Prognostic indicators: 02/28/22:  Wide excision of primary of foot - no residual melanoma,  Excision of skin of calf consistent with intransit mets of dermal/subcuticular  Melanoma with LVI, metastatic disease with multifocal lesions and multifocal positive margins 03/24/22: Re-excision with a few atypical melanocytes, no melanoma and clear margins   04/07/2022 Initial Diagnosis   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 Patient states that he  feels well and has no complaints of pain. His sodium was low at 133  and glucose was high at 139 on 11/09/2022, he informed me that he tries to include or salt most foods. I recommended he increase his fluids high in electrolytes. He inquired if he can continue taking Nivolumab. I advised that it makes me nervous to stop as the lesions of his left knee were metastatic and the lung lesions disappeared after treatment. He will continue it as normal for now. His day 1 cycle 11 of Nivolumab is on 12/11/2022. His labs today are pending. I will see him back in 4 weeks with CBC and CMP.  He denies signs of infection such as sore throat, sinus drainage, cough, or urinary symptoms.  He denies fevers or recurrent chills. He denies pain. He denies nausea, vomiting, chest pain, dyspnea or cough. His appetite is good and his weight has been stable. He is accompanied at today's appointment by his wife.   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 , 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    Past Surgical History:  Procedure Laterality Date   LUMBAR LAMINECTOMY/DECOMPRESSION MICRODISCECTOMY N/A 01/26/2022   Procedure: Microlumbar decompression Lumbar four-five, lateral mass fusion with autograft and allograft bone;  Surgeon: Jene Every, MD;  Location: [facility] OR;  Service: Orthopedics;  Laterality: N/A;   PARTIAL GASTRECTOMY     TOTAL HIP ARTHROPLASTY Right 06/18/2018   Procedure: RIGHT TOTAL HIP ARTHROPLASTY ANTERIOR APPROACH;  Surgeon: Durene Romans, MD;  Location: [facility] ORS;  Service: Orthopedics;  Laterality: Right;    Family History  Problem Relation Age of Onset   Lung cancer Sister    Melanoma Brother     Social History:   reports that he has quit smoking. He has never used smokeless tobacco. He reports that he does not currently use alcohol. He reports that he does not use drugs.    Allergies:  Allergies  Allergen Reactions   Morphine Itching    Patient states it is tolerable itching    Current Medications: Current Outpatient Medications  Medication Sig Dispense Refill   amLODipine (NORVASC) 5 MG tablet Take 5 mg by mouth daily.     clopidogrel (PLAVIX) 75 MG tablet Take 1 tablet by mouth daily.     docusate sodium (COLACE) 100 MG capsule 1 capsule 2 TIMES DAILY (route: oral)     famotidine (PEPCID) 40 MG tablet Take 1 tablet (40 mg total) by mouth daily. 30 tablet 5   Flaxseed, Linseed, (FLAXSEED OIL) 1000 MG CAPS  Take 1,000 mg by mouth daily.     folic acid (FOLVITE) 400 MCG tablet Take 400 mcg by mouth daily.     gabapentin (NEURONTIN) 300 MG capsule Take 1 capsule (300 mg total) by mouth 3 (three) times daily. 90 capsule 2   hydrochlorothiazide (HYDRODIURIL) 12.5 MG tablet Take 12.5 mg by mouth daily.     lisinopril (ZESTRIL) 20 MG tablet      Multiple Vitamin (MULTIVITAMIN WITH MINERALS) TABS tablet Take 1 tablet by mouth in the morning.     mupirocin ointment (BACTROBAN) 2 % Apply topically.     Omega-3 1000 MG CAPS Take by mouth.     ondansetron (ZOFRAN) 4 MG tablet Take 1 tablet (4 mg total) by mouth every 4 (four) hours as needed for nausea. 90 tablet 3   oxyCODONE (OXY IR/ROXICODONE) 5 MG immediate release tablet Take 1 tablet (5 mg total) by mouth every 6 (six) hours as needed for severe pain. 30 tablet 0   polyethylene glycol (MIRALAX / GLYCOLAX) 17 g packet Take 17 g by mouth daily. 14 each 0   polyethylene glycol powder (GLYCOLAX/MIRALAX) 17 GM/SCOOP powder SMARTSIG:17 Gram(s) By Mouth Daily PRN     pravastatin (PRAVACHOL) 40 MG tablet Take 40 mg by mouth daily.     prochlorperazine (COMPAZINE) 10 MG tablet Take 1 tablet (10 mg total) by mouth every 6 (six) hours as needed for nausea or  vomiting. 90 tablet 3   traZODone (DESYREL) 50 MG tablet Take 1 tablet (50 mg total) by mouth at bedtime as needed for sleep. 30 tablet 5   No current facility-administered medications for this visit.    REVIEW OF SYSTEMS:  Review of Systems  Constitutional: Negative.  Negative for appetite change, chills, diaphoresis, fatigue, fever and unexpected weight change.  HENT:  Negative.  Negative for hearing loss, lump/mass, mouth sores, nosebleeds, sore throat, tinnitus, trouble swallowing and voice change.   Eyes: Negative.  Negative for eye problems and icterus.  Respiratory: Negative.  Negative for chest tightness, cough, hemoptysis, shortness of breath and wheezing.   Cardiovascular:  Positive for leg swelling (LLE). Negative for chest pain and palpitations.  Gastrointestinal: Negative.  Negative for abdominal distention, abdominal pain, blood in stool, constipation, diarrhea, nausea, rectal pain and vomiting.  Endocrine: Negative.  Negative for hot flashes.  Genitourinary: Negative.  Negative for bladder incontinence, difficulty urinating, dyspareunia, dysuria, frequency, hematuria, nocturia, pelvic pain and penile discharge.   Musculoskeletal: Negative.  Negative for arthralgias, back pain, flank pain, gait problem, myalgias, neck pain and neck stiffness.  Skin: Negative.  Negative for itching, rash and wound.       Well healed scar below the knee.  Neurological: Negative.  Negative for dizziness, extremity weakness, gait problem, headaches, light-headedness, numbness, seizures and speech difficulty.  Hematological: Negative.  Negative for adenopathy. Does not bruise/bleed easily.  Psychiatric/Behavioral: Negative.  Negative for confusion, decreased concentration, depression, sleep disturbance and suicidal ideas. The patient is not nervous/anxious.      VITALS:  Blood pressure (!) 144/68, pulse 67, temperature 98.2 F (36.8 C), temperature source Oral, resp. rate 16, height 5' 9.69" (1.77  m), weight 233 lb 8 oz (105.9 kg), SpO2 98 %.  Wt Readings from Last 3 Encounters:  12/11/22 235 lb 1.9 oz (106.6 kg)  12/07/22 233 lb 8 oz (105.9 kg)  11/13/22 233 lb 0.6 oz (105.7 kg)    Body mass index is 33.8 kg/m.  Performance status (ECOG): 1 - Symptomatic but completely ambulatory  PHYSICAL  EXAM:  Physical Exam Vitals and nursing note reviewed.  Constitutional:      General: He is not in acute distress.    Appearance: Normal appearance. He is normal weight. He is not ill-appearing, toxic-appearing or diaphoretic.  HENT:     Head: Normocephalic and atraumatic.     Right Ear: Tympanic membrane, ear canal and external ear normal. There is no impacted cerumen.     Left Ear: Tympanic membrane, ear canal and external ear normal. There is no impacted cerumen.     Nose: Nose normal. No congestion or rhinorrhea.     Mouth/Throat:     Mouth: Mucous membranes are moist.     Pharynx: Oropharynx is clear. No oropharyngeal exudate or posterior oropharyngeal erythema.  Eyes:     General: No scleral icterus.       Right eye: No discharge.        Left eye: No discharge.     Extraocular Movements: Extraocular movements intact.     Conjunctiva/sclera: Conjunctivae normal.     Pupils: Pupils are equal, round, and reactive to light.  Neck:     Vascular: No carotid bruit.  Cardiovascular:     Rate and Rhythm: Normal rate and regular rhythm.     Pulses: Normal pulses.     Heart sounds: Normal heart sounds. No murmur heard.    No friction rub. No gallop.  Pulmonary:     Effort: Pulmonary effort is normal. No respiratory distress.     Breath sounds: Normal breath sounds. No stridor. No wheezing, rhonchi or rales.  Chest:     Chest wall: No tenderness.  Abdominal:     General: Bowel sounds are normal. There is no distension.     Palpations: Abdomen is soft. There is no hepatomegaly, splenomegaly or mass.     Tenderness: There is no abdominal tenderness. There is no right CVA tenderness,  left CVA tenderness, guarding or rebound.     Hernia: No hernia is present.  Musculoskeletal:        General: No swelling, tenderness, deformity or signs of injury. Normal range of motion.     Cervical back: Normal range of motion and neck supple. No rigidity or tenderness.     Right lower leg: No edema.     Left lower leg: 1+ Edema present.     Comments: 1+ edema of the left lower extremity. Large firm scar, medial left knee and extending posteriorly, no evidence of recurrence.   Feet:     Comments: Left foot, lateral dorsal aspect has a raised firm scar which appears benign.  Lymphadenopathy:     Cervical: No cervical adenopathy.     Upper Body:     Right upper body: No supraclavicular or axillary adenopathy.     Left upper body: No supraclavicular or axillary adenopathy.     Lower Body: No right inguinal adenopathy. No left inguinal adenopathy.  Skin:    General: Skin is warm and dry.     Coloration: Skin is not jaundiced or pale.     Findings: No bruising, erythema, lesion or rash.  Neurological:     General: No focal deficit present.     Mental Status: He is alert and oriented to person, place, and time. Mental status is at baseline.     Cranial Nerves: No cranial nerve deficit.     Sensory: No sensory deficit.     Motor: No weakness.     Coordination: Coordination normal.  Gait: Gait normal.     Deep Tendon Reflexes: Reflexes normal.  Psychiatric:        Mood and Affect: Mood normal.        Behavior: Behavior normal.        Thought Content: Thought content normal.        Judgment: Judgment normal.     LABS:      Latest Ref Rng & Units 12/07/2022    1:36 PM 11/09/2022    1:26 PM 10/13/2022   12:00 AM  CBC  WBC 4.0 - 10.5 K/uL 11.0  18.0  16.6      Hemoglobin 13.0 - 17.0 g/dL 16.1  09.6  04.5      Hematocrit 39.0 - 52.0 % 41.7  43.7  41      Platelets 150 - 400 K/uL 223  293  261         This result is from an external source.      Latest Ref Rng & Units  12/07/2022    1:36 PM 11/09/2022    1:26 PM 10/13/2022   12:00 AM  CMP  Glucose 70 - 99 mg/dL 409  811    BUN 8 - 23 mg/dL 27  22  19       Creatinine 0.61 - 1.24 mg/dL 9.14  7.82  0.9      Sodium 135 - 145 mmol/L 137  133  139      Potassium 3.5 - 5.1 mmol/L 4.0  3.7  4.3      Chloride 98 - 111 mmol/L 104  104  105      CO2 22 - 32 mmol/L 24  22  25       Calcium 8.9 - 10.3 mg/dL 8.7  8.9  9.0      Total Protein 6.5 - 8.1 g/dL 7.2  7.5    Total Bilirubin 0.3 - 1.2 mg/dL 1.0  1.0    Alkaline Phos 38 - 126 U/L 78  86  95      AST 15 - 41 U/L 24  23  111      ALT 0 - 44 U/L 21  22  22          This result is from an external source.   Component Ref Range & Units 11/09/2022 1 mo ago 4 mo ago 6 mo ago 8 mo ago  TSH 0.350 - 4.500 uIU/mL 1.490 1.551 CM 1.223 CM 1.060 CM 1.166 CM   ef Range & Units 11/09/2022 1 mo ago 4 mo ago 6 mo ago 8 mo ago  T4, Total 4.5 - 12.0 ug/dL 9.3 8.1 CM 7.4 CM 7.5 CM 8.0 CM    No results found for: "CEA1", "CEA" / No results found for: "CEA1", "CEA" No results found for: "PSA1" No results found for: "NFA213" No results found for: "CAN125"  No results found for: "TOTALPROTELP", "ALBUMINELP", "A1GS", "A2GS", "BETS", "BETA2SER", "GAMS", "MSPIKE", "SPEI" No results found for: "TIBC", "FERRITIN", "IRONPCTSAT" Lab Results  Component Value Date   LDH 160 02/07/2022    STUDIES:  No results found.  Exam: 08/16/22 CT Chest, Abdomen, and Pelvis with contrast Chest Impressions: The 2 dominant nodules in the RIGHT lung previously documented are not readily identified; however, there is a new branching nodularity/consolidation in RIGHT middle lobe. RIGHT middle lobe findings favor infectious or inflammatory process. No metastatic mediastinal adenopathy identified No cutaneous lesion.  Abdomen / Pelvis Impression: No evidence of metastatic disease in the abdomen pelvis. Benign  Bosniak 1 renal cysts. No follow-up recommended. No lymphadenopathy or peritoneal  metastasis.        I,Jasmine M Lassiter,acting as a scribe for [name], MD.,have documented all relevant documentation on the behalf of [name], MD,as directed by  [name], MD while in the presence of [name], MD.  I have reviewed this report as typed by the medical scribe, and it is complete and accurate.  [name]   12/21/22 7:00 AM

## 2022-12-07 ENCOUNTER — Telehealth: Payer: Self-pay | Admitting: Oncology

## 2022-12-07 ENCOUNTER — Inpatient Hospital Stay: Payer: Medicare HMO | Attending: Oncology

## 2022-12-07 ENCOUNTER — Encounter: Payer: Self-pay | Admitting: Oncology

## 2022-12-07 ENCOUNTER — Inpatient Hospital Stay (INDEPENDENT_AMBULATORY_CARE_PROVIDER_SITE_OTHER): Payer: Medicare HMO | Admitting: Oncology

## 2022-12-07 VITALS — BP 144/68 | HR 67 | Temp 98.2°F | Resp 16 | Ht 69.69 in | Wt 233.5 lb

## 2022-12-07 DIAGNOSIS — C439 Malignant melanoma of skin, unspecified: Secondary | ICD-10-CM | POA: Diagnosis not present

## 2022-12-07 DIAGNOSIS — C792 Secondary malignant neoplasm of skin: Secondary | ICD-10-CM

## 2022-12-07 DIAGNOSIS — Z79899 Other long term (current) drug therapy: Secondary | ICD-10-CM | POA: Insufficient documentation

## 2022-12-07 DIAGNOSIS — C4372 Malignant melanoma of left lower limb, including hip: Secondary | ICD-10-CM | POA: Insufficient documentation

## 2022-12-07 DIAGNOSIS — Z5112 Encounter for antineoplastic immunotherapy: Secondary | ICD-10-CM | POA: Insufficient documentation

## 2022-12-07 LAB — CBC WITH DIFFERENTIAL (CANCER CENTER ONLY)
Abs Immature Granulocytes: 0.02 10*3/uL (ref 0.00–0.07)
Basophils Absolute: 0.1 10*3/uL (ref 0.0–0.1)
Basophils Relative: 1 %
Eosinophils Absolute: 0.6 10*3/uL — ABNORMAL HIGH (ref 0.0–0.5)
Eosinophils Relative: 5 %
HCT: 41.7 % (ref 39.0–52.0)
Hemoglobin: 13.8 g/dL (ref 13.0–17.0)
Immature Granulocytes: 0 %
Lymphocytes Relative: 24 %
Lymphs Abs: 2.6 10*3/uL (ref 0.7–4.0)
MCH: 28.6 pg (ref 26.0–34.0)
MCHC: 33.1 g/dL (ref 30.0–36.0)
MCV: 86.5 fL (ref 80.0–100.0)
Monocytes Absolute: 1 10*3/uL (ref 0.1–1.0)
Monocytes Relative: 9 %
Neutro Abs: 6.7 10*3/uL (ref 1.7–7.7)
Neutrophils Relative %: 61 %
Platelet Count: 223 10*3/uL (ref 150–400)
RBC: 4.82 MIL/uL (ref 4.22–5.81)
RDW: 13.4 % (ref 11.5–15.5)
WBC Count: 11 10*3/uL — ABNORMAL HIGH (ref 4.0–10.5)
nRBC: 0 % (ref 0.0–0.2)

## 2022-12-07 LAB — CMP (CANCER CENTER ONLY)
ALT: 21 U/L (ref 0–44)
AST: 24 U/L (ref 15–41)
Albumin: 4.2 g/dL (ref 3.5–5.0)
Alkaline Phosphatase: 78 U/L (ref 38–126)
Anion gap: 9 (ref 5–15)
BUN: 27 mg/dL — ABNORMAL HIGH (ref 8–23)
CO2: 24 mmol/L (ref 22–32)
Calcium: 8.7 mg/dL — ABNORMAL LOW (ref 8.9–10.3)
Chloride: 104 mmol/L (ref 98–111)
Creatinine: 1.22 mg/dL (ref 0.61–1.24)
GFR, Estimated: >60 mL/min (ref >60)
Glucose, Bld: 122 mg/dL — ABNORMAL HIGH (ref 70–99)
Potassium: 4 mmol/L (ref 3.5–5.1)
Sodium: 137 mmol/L (ref 135–145)
Total Bilirubin: 1 mg/dL (ref 0.3–1.2)
Total Protein: 7.2 g/dL (ref 6.5–8.1)

## 2022-12-07 LAB — TSH: TSH: 1.468 u[IU]/mL (ref 0.350–4.500)

## 2022-12-07 NOTE — Telephone Encounter (Signed)
Patient has been scheduled for follow-up visit per 12/07/22 LOS.  Pt given an appt calendar with date and time.  

## 2022-12-08 ENCOUNTER — Other Ambulatory Visit: Payer: Self-pay

## 2022-12-08 MED FILL — Nivolumab IV Soln 240 MG/24ML: INTRAVENOUS | Qty: 48 | Status: AC

## 2022-12-09 LAB — T4: T4, Total: 8.3 ug/dL (ref 4.5–12.0)

## 2022-12-11 ENCOUNTER — Inpatient Hospital Stay: Payer: Medicare HMO

## 2022-12-11 VITALS — BP 160/101 | HR 84 | Temp 98.0°F | Resp 16 | Ht 69.39 in | Wt 235.1 lb

## 2022-12-11 DIAGNOSIS — Z79899 Other long term (current) drug therapy: Secondary | ICD-10-CM | POA: Diagnosis not present

## 2022-12-11 DIAGNOSIS — C439 Malignant melanoma of skin, unspecified: Secondary | ICD-10-CM

## 2022-12-11 DIAGNOSIS — Z5112 Encounter for antineoplastic immunotherapy: Secondary | ICD-10-CM | POA: Diagnosis not present

## 2022-12-11 DIAGNOSIS — C792 Secondary malignant neoplasm of skin: Secondary | ICD-10-CM

## 2022-12-11 DIAGNOSIS — C4372 Malignant melanoma of left lower limb, including hip: Secondary | ICD-10-CM | POA: Diagnosis not present

## 2022-12-11 MED ORDER — SODIUM CHLORIDE 0.9 % IV SOLN: Freq: Once | INTRAVENOUS | Status: AC

## 2022-12-11 MED ORDER — SODIUM CHLORIDE 0.9% FLUSH: 10.0000 mL | INTRAVENOUS | Status: DC | PRN

## 2022-12-11 MED ORDER — SODIUM CHLORIDE 0.9 % IV SOLN
480.0000 mg | Freq: Once | INTRAVENOUS | Status: AC
  Administered 2022-12-11: 480 mg via INTRAVENOUS
  Filled 2022-12-11: qty 48

## 2022-12-11 MED ORDER — HEPARIN SOD (PORK) LOCK FLUSH 100 UNIT/ML IV SOLN: 500.0000 [IU] | Freq: Once | INTRAVENOUS | Status: DC | PRN

## 2022-12-11 NOTE — Patient Instructions (Signed)
Nivolumab Injection What is this medication? NIVOLUMAB (nye VOL ue mab) treats some types of cancer. It works by helping your immune system slow or stop the spread of cancer cells. It is a monoclonal antibody. This medicine may be used for other purposes; ask your health care provider or pharmacist if you have questions. COMMON BRAND NAME(S): Opdivo What should I tell my care team before I take this medication? They need to know if you have any of these conditions: Allogeneic stem cell transplant (uses someone else's stem cells) Autoimmune diseases, such as Crohn disease, ulcerative colitis, lupus History of chest radiation Nervous system problems, such as Guillain-Barre syndrome or myasthenia gravis Organ transplant An unusual or allergic reaction to nivolumab, other medications, foods, dyes, or preservatives Pregnant or trying to get pregnant Breast-feeding How should I use this medication? This medication is infused into a vein. It is given in a hospital or clinic setting. A special MedGuide will be given to you before each treatment. Be sure to read this information carefully each time. Talk to your care team about the use of this medication in children. While it may be prescribed for children as young as 12 years for selected conditions, precautions do apply. Overdosage: If you think you have taken too much of this medicine contact a poison control center or emergency room at once. NOTE: This medicine is only for you. Do not share this medicine with others. What if I miss a dose? Keep appointments for follow-up doses. It is important not to miss your dose. Call your care team if you are unable to keep an appointment. What may interact with this medication? Interactions have not been studied. This list may not describe all possible interactions. Give your health care provider a list of all the medicines, herbs, non-prescription drugs, or dietary supplements you use. Also tell them if you  smoke, drink alcohol, or use illegal drugs. Some items may interact with your medicine. What should I watch for while using this medication? Your condition will be monitored carefully while you are receiving this medication. You may need blood work while taking this medication. This medication may cause serious skin reactions. They can happen weeks to months after starting the medication. Contact your care team right away if you notice fevers or flu-like symptoms with a rash. The rash may be red or purple and then turn into blisters or peeling of the skin. You may also notice a red rash with swelling of the face, lips, or lymph nodes in your neck or under your arms. Tell your care team right away if you have any change in your eyesight. Talk to your care team if you are pregnant or think you might be pregnant. A negative pregnancy test is required before starting this medication. A reliable form of contraception is recommended while taking this medication and for 5 months after the last dose. Talk to your care team about effective forms of contraception. Do not breast-feed while taking this medication and for 5 months after the last dose. What side effects may I notice from receiving this medication? Side effects that you should report to your care team as soon as possible: Allergic reactions--skin rash, itching, hives, swelling of the face, lips, tongue, or throat Dry cough, shortness of breath or trouble breathing Eye pain, redness, irritation, or discharge with blurry or decreased vision Heart muscle inflammation--unusual weakness or fatigue, shortness of breath, chest pain, fast or irregular heartbeat, dizziness, swelling of the ankles, feet, or hands Hormone   gland problems--headache, sensitivity to light, unusual weakness or fatigue, dizziness, fast or irregular heartbeat, increased sensitivity to cold or heat, excessive sweating, constipation, hair loss, increased thirst or amount of urine,  tremors or shaking, irritability Infusion reactions--chest pain, shortness of breath or trouble breathing, feeling faint or lightheaded Kidney injury (glomerulonephritis)--decrease in the amount of urine, red or dark brown urine, foamy or bubbly urine, swelling of the ankles, hands, or feet Liver injury--right upper belly pain, loss of appetite, nausea, light-colored stool, dark yellow or brown urine, yellowing skin or eyes, unusual weakness or fatigue Pain, tingling, or numbness in the hands or feet, muscle weakness, change in vision, confusion or trouble speaking, loss of balance or coordination, trouble walking, seizures Rash, fever, and swollen lymph nodes Redness, blistering, peeling, or loosening of the skin, including inside the mouth Sudden or severe stomach pain, bloody diarrhea, fever, nausea, vomiting Side effects that usually do not require medical attention (report these to your care team if they continue or are bothersome): Bone, joint, or muscle pain Diarrhea Fatigue Loss of appetite Nausea Skin rash This list may not describe all possible side effects. Call your doctor for medical advice about side effects. You may report side effects to FDA at 1-800-FDA-1088. Where should I keep my medication? This medication is given in a hospital or clinic. It will not be stored at home. NOTE: This sheet is a summary. It may not cover all possible information. If you have questions about this medicine, talk to your doctor, pharmacist, or health care provider.  2023 [employer] Standard (2014-06-29 00:00:00)  

## 2022-12-21 ENCOUNTER — Encounter: Payer: Self-pay | Admitting: Oncology

## 2022-12-27 ENCOUNTER — Telehealth: Payer: Self-pay

## 2022-12-27 DIAGNOSIS — M5451 Vertebrogenic low back pain: Secondary | ICD-10-CM | POA: Diagnosis not present

## 2022-12-27 NOTE — Telephone Encounter (Signed)
[name], gave her information from Dr. Gilman Buttner.  They will keep the ortho appointment this afternoon.  I encouraged [name] to call if ortho thinks we need to see him or if no improvement.

## 2022-12-27 NOTE — Telephone Encounter (Signed)
-----   Message from [name], MD sent at 12/27/2022  2:01 PM EDT ----- Regarding: RE: Pain Not likely related to the immunotherapy, esp if 1 leg, but it is possible ----- Message ----- From: Dyane Dustman, RN Sent: 12/27/2022   7:57 AM EDT To: [name], MD Subject: Pain                                           Daughter [name] called concerned about [name] leg pain, stating that he is having difficulty walking and is now using a cane.  Pt believes related to sciatica, hip or lower back issue since he did have back surgery approx a year ago.  [name] is wondering if this could be a side effect of his immunotherapy?  He does have an appointment with his orthopedic physician this afternoon at 4:00.  Any recommendation?

## 2023-01-02 NOTE — Progress Notes (Signed)
[facility]  [address] [place],  [place]  [address] [phone]  Clinic Day: 01/04/2023  Referring physician: [name], MD  ASSESSMENT & PLAN:  Assessment: Malignant melanoma of the left foot He had a punch biopsy and this reveals a Breslow thickness of 1.6 mm and Clark's level 4 with ulceration for a T2b N0 M0 clinically.  We found no distant metastasis on PET scan.  His tumor is positive for BRAF mutation. He had this treated with wide local excision.   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   Multiple lesions of the lung These were suspicious for metastatic disease but have now resolved on the CT scan on 08/12/22.  The PET scan was negative but we cannot absolutely rule out metastatic disease since these lesions are small. We discussed the fact that the resolution of these lesions could represent a response to treatment, or may not have been malignant at all. This would affect the duration of his immunotherapy.  We have had several discussions about this.  Spinal stenosis at L4-5 He has had surgical decompression and resection of a synovial cyst as of January 26 2022.  Leukocytosis He has had chronic leukocytosis and no symptoms of infection and urinalysis and urine culture were negative.  We did not find an explanation but the [facility] continue to fluctuate up and down.  Plan: His orthopedic doctor stated that he had a pinched sciatic nerve and placed him on Prednisone dose pack and his pain is slowly resolving.  He finished his first pack and hasn't started the second. I advised him not to do so if he is improved.  I will refill his gabapentin 300 mg TID, which was increased by his orthopedic surgeon. He will receive day 1 cycle 12 of Nivolumab on 01/08/2023.  His labs today are pending. I will see him back in 4 weeks with CBC, CMP, LDH and PET scan a day before his appointment with me. The patient was provided an opportunity to ask questions and all were answered.  The patient agreed with the plan and demonstrated an understanding of the instructions.  The patient was advised to call back if the symptoms worsen or if the condition fails to improve as anticipated.  I provided 16  minutes of face-to-face time during this this encounter and > 50% was spent counseling as documented under my assessment and plan.   [name], MD [facility] [address] [place] [address] Dept: [phone] Dept Fax: [phone]   CHIEF COMPLAINT:  CC: Malignant melanoma  Current Treatment: Evaluation and immunotherapy   HISTORY OF PRESENT ILLNESS:  [name] is a 77 y.o. male with a history of malignant melanoma who is referred in consultation with Dr. Luna Kitchens for assessment and management.  He had a lesion of the dorsum of the left foot in September 2022 which appeared to be a granuloma and was treated in the office.  He later developed a another lesion lateral to this on the dorsum of the left foot which was not hyperpigmented but did bleed easily.  He also has several lesions of his left medial calf which are nodular and span approximately 6 cm.  He has had some increased edema of the  left lower leg.  He was evaluated by dermatology on June 5, who felt this could be a squamous cell carcinoma versus tinea or a drug reaction.  He had biopsies done of both the foot lesion and the Lesion.  Pathology came back that the left dorsal foot shave biopsy revealed a malignant melanoma possibly a primary nodular ulcerated melanoma which had a Breslow thickness of at least 1.6 mm and a Clark's level 4.  Margins are positive as this was a shave biopsy.  Ulceration is present but no satellitosis.  The  mitotic index is 4/mm and no lymphovascular invasion was identified.  No brisk tumor infiltrating lymphocytes are seen and tumor regression is absent.  This is felt to be at least a T2b lesion and wide excision was recommended.  The lesion of the left medial calf revealed a dermal map melanoma most consistent with metastatic disease.  This information was not available when the patient was admitted for back surgery on June 8 and he had a microlumbar decompression at L4-5 for spinal stenosis.  He was also found to have a synovial cyst at that time which was resected and benign.  He was readmitted several days later for acute pain of the right elbow and both arms.  Aspiration was obtained and this was diagnosed as gouty arthritis.  He did have temporary rehab after his back surgery.  His left leg was evaluated for deep venous thrombosis and was negative.  While he was recuperating from his surgery he had an x-ray which was abnormal which led to a CT scan which revealed at least 3 lung lesions.  MRI of the brain was negative and he is now referred for management of the malignant melanoma.  Oncology History  Malignant melanoma, metastatic (HCC)  01/23/2022 Cancer Staging   Staging form: Melanoma of the Skin, AJCC 8th Edition - Clinical stage from 01/23/2022: Stage III (cT2b, cN1c, cM0) - Signed by [name], MD on 04/07/2022 Histopathologic type: Malignant melanoma, NOS (except juvenile melanoma M-8770/[id number]) Stage prefix: Initial diagnosis Laterality: Left Lymph-vascular invasion (LVI): LVI not present (absent)/not identified Diagnostic confirmation: Positive histology Specimen type: Biopsy / Limited Resection Staged by: Managing physician Mitotic count: 4 Mitotic unit: mm2 Clark's level: Level IV Tumor-infiltrating lymphocytes: Present and non-brisk Breslow depth (mm): 16 Ulceration of the epidermis: Yes Microsatellites: No Primary tumor regression: Absent Intransit/satellite metastasis:  In-transit Lymph node clinically or radiologically detected: No Microscopic confirmation of metastasis: No Sentinel lymph node biopsy performed: No Completion or therapeutic lymph node dissection performed: No Matted nodes: No Stage used in treatment planning: Yes National guidelines used in treatment planning: Yes Type of national guideline used in treatment planning: NCCN   01/31/2022 Initial Diagnosis   Malignant melanoma, metastatic (HCC)   04/14/2022 -  Chemotherapy   Patient is on Treatment Plan : MELANOMA Nivolumab (480) q28d (changed from q14d on 12/4)     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)  02/28/2022 Cancer Staging   Staging form: Melanoma of the Skin, AJCC 8th Edition - Clinical stage from 02/28/2022: Stage IV (cT2b(m), cN1c, cM1a) - Signed by [name], MD on 04/07/2022 Histopathologic type: Malignant melanoma, NOS (except juvenile melanoma M-8770/[id number]) Stage prefix: Initial diagnosis Laterality: Left Multiple tumors: Yes Lymph-vascular invasion (LVI): LVI present/identified, NOS Diagnostic confirmation: Positive histology Specimen type: Excision Staged by: Managing physician Intransit/satellite metastasis: Both in-transit and satellite Prognostic indicators: 02/28/22:  Wide excision of primary of foot - no residual melanoma,  Excision of skin of calf consistent  with intransit mets of dermal/subcuticular   Melanoma with LVI, metastatic disease with multifocal lesions and multifocal positive margins 03/24/22: Re-excision with a few atypical melanocytes, no melanoma and clear margins   04/07/2022 Initial Diagnosis   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 Patient started Nivolumab and Ipilimumab on 04/15/2023.  Patient states that he  feels well  and complains of back pain when walking for the last couple of weeks. His orthopedic doctor stated that he had a pinched sciatic nerve and placed him on Prednisone dose pack and his pain is slowly resolving.  He finished his first pack and hasn't started the second. I advised him not to do so if he is improved.  I will refill his gabapentin 300 mg TID, which was increased by his orthopedic surgeon. He will receive day 1 cycle 12 of Nivolumab on 01/08/2023. His labs today are pending. I will see him back in 4 weeks with CBC, CMP, LDH and PET scan a day before his appointment with me. He denies signs of infection such as sore throat, sinus drainage, cough, or urinary symptoms.  He denies fevers or recurrent chills. He denies pain. He denies nausea, vomiting, chest pain, dyspnea or cough. His appetite is great and his weight has decreased 3 pounds over last 4 week .  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 , 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    Past Surgical History:  Procedure Laterality Date   LUMBAR LAMINECTOMY/DECOMPRESSION MICRODISCECTOMY N/A 01/26/2022   Procedure: Microlumbar decompression Lumbar four-five, lateral mass fusion with autograft and allograft bone;  Surgeon: Jene Every, MD;  Location: [facility] OR;  Service: Orthopedics;  Laterality: N/A;   PARTIAL GASTRECTOMY     TOTAL HIP ARTHROPLASTY Right 06/18/2018   Procedure: RIGHT TOTAL HIP ARTHROPLASTY ANTERIOR APPROACH;  Surgeon: Durene Romans, MD;  Location: [facility] ORS;  Service: Orthopedics;  Laterality: Right;    Family History  Problem Relation Age of Onset   Lung cancer Sister     Melanoma Brother     Social History:  reports that he has quit smoking. He has never used smokeless tobacco. He reports that he does not currently use alcohol. He reports that he does not use drugs.    Allergies:  Allergies  Allergen Reactions   Morphine Itching    Patient states it is tolerable itching    Current Medications: Current Outpatient Medications  Medication Sig Dispense Refill   predniSONE (STERAPRED UNI-PAK 21 TAB) 5 MG (21) TBPK tablet See admin instructions. follow package directions     amLODipine (NORVASC) 5 MG tablet Take 5 mg by mouth daily.     clopidogrel (PLAVIX) 75 MG tablet Take 1 tablet by mouth daily.     docusate sodium (COLACE) 100 MG capsule 1 capsule 2  TIMES DAILY (route: oral)     famotidine (PEPCID) 40 MG tablet Take 1 tablet (40 mg total) by mouth daily. 30 tablet 5   Flaxseed, Linseed, (FLAXSEED OIL) 1000 MG CAPS Take 1,000 mg by mouth daily.     folic acid (FOLVITE) 400 MCG tablet Take 400 mcg by mouth daily.     gabapentin (NEURONTIN) 300 MG capsule Take 1 capsule (300 mg total) by mouth 3 (three) times daily. 90 capsule 2   hydrochlorothiazide (HYDRODIURIL) 12.5 MG tablet Take 12.5 mg by mouth daily.     lisinopril (ZESTRIL) 20 MG tablet      Multiple Vitamin (MULTIVITAMIN WITH MINERALS) TABS tablet Take 1 tablet by mouth in the morning.     mupirocin ointment (BACTROBAN) 2 % Apply topically.     Omega-3 1000 MG CAPS Take by mouth.     ondansetron (ZOFRAN) 4 MG tablet Take 1 tablet (4 mg total) by mouth every 4 (four) hours as needed for nausea. 90 tablet 3   oxyCODONE (OXY IR/ROXICODONE) 5 MG immediate release tablet Take 1 tablet (5 mg total) by mouth every 6 (six) hours as needed for severe pain. 30 tablet 0   polyethylene glycol (MIRALAX / GLYCOLAX) 17 g packet Take 17 g by mouth daily. 14 each 0   pravastatin (PRAVACHOL) 40 MG tablet Take 40 mg by mouth daily.     prochlorperazine (COMPAZINE) 10 MG tablet Take 1 tablet (10 mg total) by mouth  every 6 (six) hours as needed for nausea or vomiting. 90 tablet 3   traZODone (DESYREL) 50 MG tablet Take 1 tablet (50 mg total) by mouth at bedtime as needed for sleep. 30 tablet 5   No current facility-administered medications for this visit.    REVIEW OF SYSTEMS:  Review of Systems  Constitutional: Negative.  Negative for appetite change, chills, diaphoresis, fatigue, fever and unexpected weight change.  HENT:  Negative.  Negative for hearing loss, lump/mass, mouth sores, nosebleeds, sore throat, tinnitus, trouble swallowing and voice change.   Eyes: Negative.  Negative for eye problems and icterus.  Respiratory: Negative.  Negative for chest tightness, cough, hemoptysis, shortness of breath and wheezing.   Cardiovascular:  Positive for leg swelling (LLE and leg pain). Negative for chest pain and palpitations.  Gastrointestinal: Negative.  Negative for abdominal distention, abdominal pain, blood in stool, constipation, diarrhea, nausea, rectal pain and vomiting.  Endocrine: Negative.  Negative for hot flashes.  Genitourinary: Negative.  Negative for bladder incontinence, difficulty urinating, dyspareunia, dysuria, frequency, hematuria, nocturia, pelvic pain and penile discharge.   Musculoskeletal:  Positive for back pain. Negative for arthralgias, flank pain, gait problem, myalgias, neck pain and neck stiffness.  Skin: Negative.  Negative for itching, rash and wound.       Well healed scar below the knee.  Neurological: Negative.  Negative for dizziness, extremity weakness, gait problem, headaches, light-headedness, numbness, seizures and speech difficulty.  Hematological: Negative.  Negative for adenopathy. Does not bruise/bleed easily.  Psychiatric/Behavioral: Negative.  Negative for confusion, decreased concentration, depression, sleep disturbance and suicidal ideas. The patient is not nervous/anxious.      VITALS:  Blood pressure 129/81, pulse 89, temperature 98.2 F (36.8 C),  temperature source Oral, resp. rate 16, height 5' 9.39" (1.763 m), weight 230 lb 11.2 oz (104.6 kg), SpO2 96 %.  Wt Readings from Last 3 Encounters:  01/08/23 236 lb 1.9 oz (107.1 kg)  01/04/23 230 lb 11.2 oz (104.6 kg)  12/11/22 235 lb 1.9 oz (106.6 kg)  Body mass index is 33.69 kg/m.  Performance status (ECOG): 1 - Symptomatic but completely ambulatory  PHYSICAL EXAM:  Physical Exam Vitals and nursing note reviewed.  Constitutional:      General: He is not in acute distress.    Appearance: Normal appearance. He is normal weight. He is not ill-appearing, toxic-appearing or diaphoretic.  HENT:     Head: Normocephalic and atraumatic.     Right Ear: Tympanic membrane, ear canal and external ear normal. There is no impacted cerumen.     Left Ear: Tympanic membrane, ear canal and external ear normal. There is no impacted cerumen.     Nose: Nose normal. No congestion or rhinorrhea.     Mouth/Throat:     Mouth: Mucous membranes are moist.     Pharynx: Oropharynx is clear. No oropharyngeal exudate or posterior oropharyngeal erythema.  Eyes:     General: No scleral icterus.       Right eye: No discharge.        Left eye: No discharge.     Extraocular Movements: Extraocular movements intact.     Conjunctiva/sclera: Conjunctivae normal.     Pupils: Pupils are equal, round, and reactive to light.  Neck:     Vascular: No carotid bruit.  Cardiovascular:     Rate and Rhythm: Normal rate and regular rhythm.     Pulses: Normal pulses.     Heart sounds: Normal heart sounds. No murmur heard.    No friction rub. No gallop.  Pulmonary:     Effort: Pulmonary effort is normal. No respiratory distress.     Breath sounds: Normal breath sounds. No stridor. No wheezing, rhonchi or rales.  Chest:     Chest wall: No tenderness.  Abdominal:     General: Bowel sounds are normal. There is no distension.     Palpations: Abdomen is soft. There is no hepatomegaly, splenomegaly or mass.     Tenderness:  There is no abdominal tenderness. There is no right CVA tenderness, left CVA tenderness, guarding or rebound.     Hernia: No hernia is present.  Musculoskeletal:        General: No swelling, tenderness, deformity or signs of injury. Normal range of motion.     Cervical back: Normal range of motion and neck supple. No rigidity or tenderness.     Right lower leg: No edema.     Left lower leg: Edema present.     Comments: Trace edema of the left leg Wound is well healed with extensive scar tissue.   Lymphadenopathy:     Cervical: No cervical adenopathy.     Upper Body:     Right upper body: No supraclavicular or axillary adenopathy.     Left upper body: No supraclavicular or axillary adenopathy.     Lower Body: No right inguinal adenopathy. No left inguinal adenopathy.  Skin:    General: Skin is warm and dry.     Coloration: Skin is not jaundiced or pale.     Findings: No bruising, erythema, lesion or rash.  Neurological:     General: No focal deficit present.     Mental Status: He is alert and oriented to person, place, and time. Mental status is at baseline.     Cranial Nerves: No cranial nerve deficit.     Sensory: No sensory deficit.     Motor: No weakness.     Coordination: Coordination normal.     Gait: Gait normal.     Deep Tendon  Reflexes: Reflexes normal.  Psychiatric:        Mood and Affect: Mood normal.        Behavior: Behavior normal.        Thought Content: Thought content normal.        Judgment: Judgment normal.     LABS:      Latest Ref Rng & Units 01/04/2023    1:23 PM 12/07/2022    1:36 PM 11/09/2022    1:26 PM  CBC  WBC 4.0 - 10.5 K/uL 17.1  11.0  18.0   Hemoglobin 13.0 - 17.0 g/dL 16.1  09.6  04.5   Hematocrit 39.0 - 52.0 % 45.6  41.7  43.7   Platelets 150 - 400 K/uL 360  223  293       Latest Ref Rng & Units 01/04/2023    1:23 PM 12/07/2022    1:36 PM 11/09/2022    1:26 PM  CMP  Glucose 70 - 99 mg/dL 409  811  914   BUN 8 - 23 mg/dL 27  27  22     Creatinine 0.61 - 1.24 mg/dL 7.82  9.56  2.13   Sodium 135 - 145 mmol/L 137  137  133   Potassium 3.5 - 5.1 mmol/L 4.2  4.0  3.7   Chloride 98 - 111 mmol/L 105  104  104   CO2 22 - 32 mmol/L 22  24  22    Calcium 8.9 - 10.3 mg/dL 8.8  8.7  8.9   Total Protein 6.5 - 8.1 g/dL 7.9  7.2  7.5   Total Bilirubin 0.3 - 1.2 mg/dL 0.6  1.0  1.0   Alkaline Phos 38 - 126 U/L 66  78  86   AST 15 - 41 U/L 26  24  23    ALT 0 - 44 U/L 22  21  22     Component Ref Range & Units 12/07/2022 11/09/2022 2 mo ago 5 mo ago 7 mo ago 9 mo ago  TSH 0.350 - 4.500 uIU/mL 1.468 1.490 CM 1.551 CM 1.223 CM 1.060 CM 1.166 CM   Component Ref Range & Units 12/07/2022 11/09/2022 2 mo ago 5 mo ago 7 mo ago 9 mo ago  T4, Total 4.5 - 12.0 ug/dL 8.3 9.3 CM 8.1 CM 7.4 CM 7.5 CM 8.0 CM      No results found for: "CEA1", "CEA" / No results found for: "CEA1", "CEA" No results found for: "PSA1" No results found for: "YQM578" No results found for: "CAN125"  No results found for: "TOTALPROTELP", "ALBUMINELP", "A1GS", "A2GS", "BETS", "BETA2SER", "GAMS", "MSPIKE", "SPEI" No results found for: "TIBC", "FERRITIN", "IRONPCTSAT" Lab Results  Component Value Date   LDH 160 02/07/2022    STUDIES:  No results found.  Exam: 08/16/22 CT Chest, Abdomen, and Pelvis with contrast Chest Impressions: The 2 dominant nodules in the RIGHT lung previously documented are not readily identified; however, there is a new branching nodularity/consolidation in RIGHT middle lobe. RIGHT middle lobe findings favor infectious or inflammatory process. No metastatic mediastinal adenopathy identified No cutaneous lesion.  Abdomen / Pelvis Impression: No evidence of metastatic disease in the abdomen pelvis. Benign Bosniak 1 renal cysts. No follow-up recommended. No lymphadenopathy or peritoneal metastasis.     I,Jasmine M Lassiter,acting as a scribe for [name], MD.,have documented all relevant documentation on the behalf of  [name], MD,as directed by  [name], MD while in the presence of [name], MD.  I have  reviewed this report as typed by the medical scribe, and it is complete and accurate.  [name]   01/19/23 5:29 PM

## 2023-01-04 ENCOUNTER — Other Ambulatory Visit: Payer: Self-pay | Admitting: Oncology

## 2023-01-04 ENCOUNTER — Inpatient Hospital Stay: Payer: Medicare HMO | Attending: Oncology | Admitting: Oncology

## 2023-01-04 ENCOUNTER — Inpatient Hospital Stay: Payer: Medicare HMO

## 2023-01-04 ENCOUNTER — Encounter: Payer: Self-pay | Admitting: Oncology

## 2023-01-04 DIAGNOSIS — Z5112 Encounter for antineoplastic immunotherapy: Secondary | ICD-10-CM | POA: Diagnosis not present

## 2023-01-04 DIAGNOSIS — C439 Malignant melanoma of skin, unspecified: Secondary | ICD-10-CM

## 2023-01-04 DIAGNOSIS — M48061 Spinal stenosis, lumbar region without neurogenic claudication: Secondary | ICD-10-CM

## 2023-01-04 DIAGNOSIS — C792 Secondary malignant neoplasm of skin: Secondary | ICD-10-CM

## 2023-01-04 DIAGNOSIS — C4372 Malignant melanoma of left lower limb, including hip: Secondary | ICD-10-CM | POA: Diagnosis not present

## 2023-01-04 DIAGNOSIS — Z79899 Other long term (current) drug therapy: Secondary | ICD-10-CM | POA: Insufficient documentation

## 2023-01-04 LAB — TSH: TSH: 1.052 u[IU]/mL (ref 0.350–4.500)

## 2023-01-04 LAB — CMP (CANCER CENTER ONLY)
ALT: 22 U/L (ref 0–44)
AST: 26 U/L (ref 15–41)
Albumin: 4.3 g/dL (ref 3.5–5.0)
Alkaline Phosphatase: 66 U/L (ref 38–126)
Anion gap: 10 (ref 5–15)
BUN: 27 mg/dL — ABNORMAL HIGH (ref 8–23)
CO2: 22 mmol/L (ref 22–32)
Calcium: 8.8 mg/dL — ABNORMAL LOW (ref 8.9–10.3)
Chloride: 105 mmol/L (ref 98–111)
Creatinine: 1.2 mg/dL (ref 0.61–1.24)
GFR, Estimated: >60 mL/min (ref >60)
Glucose, Bld: 110 mg/dL — ABNORMAL HIGH (ref 70–99)
Potassium: 4.2 mmol/L (ref 3.5–5.1)
Sodium: 137 mmol/L (ref 135–145)
Total Bilirubin: 0.6 mg/dL (ref 0.3–1.2)
Total Protein: 7.9 g/dL (ref 6.5–8.1)

## 2023-01-04 LAB — CBC WITH DIFFERENTIAL (CANCER CENTER ONLY)
Abs Immature Granulocytes: 0.08 10*3/uL — ABNORMAL HIGH (ref 0.00–0.07)
Basophils Absolute: 0.1 10*3/uL (ref 0.0–0.1)
Basophils Relative: 0 %
Eosinophils Absolute: 0.8 10*3/uL — ABNORMAL HIGH (ref 0.0–0.5)
Eosinophils Relative: 5 %
HCT: 45.6 % (ref 39.0–52.0)
Hemoglobin: 15.2 g/dL (ref 13.0–17.0)
Immature Granulocytes: 1 %
Lymphocytes Relative: 20 %
Lymphs Abs: 3.4 10*3/uL (ref 0.7–4.0)
MCH: 28.9 pg (ref 26.0–34.0)
MCHC: 33.3 g/dL (ref 30.0–36.0)
MCV: 86.7 fL (ref 80.0–100.0)
Monocytes Absolute: 1.6 10*3/uL — ABNORMAL HIGH (ref 0.1–1.0)
Monocytes Relative: 9 %
Neutro Abs: 11.2 10*3/uL — ABNORMAL HIGH (ref 1.7–7.7)
Neutrophils Relative %: 65 %
Platelet Count: 360 10*3/uL (ref 150–400)
RBC: 5.26 MIL/uL (ref 4.22–5.81)
RDW: 13.2 % (ref 11.5–15.5)
WBC Count: 17.1 10*3/uL — ABNORMAL HIGH (ref 4.0–10.5)
nRBC: 0 % (ref 0.0–0.2)

## 2023-01-04 MED ORDER — GABAPENTIN 300 MG PO CAPS
300.0000 mg | ORAL_CAPSULE | Freq: Three times a day (TID) | ORAL | 2 refills | Status: DC
Start: 2023-01-04 — End: 2023-03-07

## 2023-01-05 MED FILL — Nivolumab IV Soln 240 MG/24ML: INTRAVENOUS | Qty: 48 | Status: AC

## 2023-01-06 ENCOUNTER — Other Ambulatory Visit: Payer: Self-pay

## 2023-01-08 ENCOUNTER — Inpatient Hospital Stay: Payer: Medicare HMO

## 2023-01-08 ENCOUNTER — Other Ambulatory Visit: Payer: Self-pay | Admitting: Pharmacist

## 2023-01-08 VITALS — BP 163/69 | HR 76 | Temp 97.7°F | Resp 16 | Ht 69.39 in | Wt 236.1 lb

## 2023-01-08 DIAGNOSIS — Z79899 Other long term (current) drug therapy: Secondary | ICD-10-CM | POA: Diagnosis not present

## 2023-01-08 DIAGNOSIS — C792 Secondary malignant neoplasm of skin: Secondary | ICD-10-CM

## 2023-01-08 DIAGNOSIS — C4372 Malignant melanoma of left lower limb, including hip: Secondary | ICD-10-CM | POA: Diagnosis not present

## 2023-01-08 DIAGNOSIS — C439 Malignant melanoma of skin, unspecified: Secondary | ICD-10-CM

## 2023-01-08 DIAGNOSIS — Z5112 Encounter for antineoplastic immunotherapy: Secondary | ICD-10-CM | POA: Diagnosis not present

## 2023-01-08 LAB — T4: T4, Total: 8.8 ug/dL (ref 4.5–12.0)

## 2023-01-08 MED ORDER — HEPARIN SOD (PORK) LOCK FLUSH 100 UNIT/ML IV SOLN
500.0000 [IU] | Freq: Once | INTRAVENOUS | Status: AC | PRN
  Administered 2023-01-08: 500 [IU]

## 2023-01-08 MED ORDER — SODIUM CHLORIDE 0.9% FLUSH
10.0000 mL | INTRAVENOUS | Status: DC | PRN
  Administered 2023-01-08: 10 mL

## 2023-01-08 MED ORDER — SODIUM CHLORIDE 0.9 % IV SOLN: Freq: Once | INTRAVENOUS | Status: AC

## 2023-01-08 MED ORDER — SODIUM CHLORIDE 0.9 % IV SOLN
480.0000 mg | Freq: Once | INTRAVENOUS | Status: AC
  Administered 2023-01-08: 480 mg via INTRAVENOUS
  Filled 2023-01-08: qty 48

## 2023-01-08 NOTE — Patient Instructions (Signed)
Nivolumab Injection What is this medication? NIVOLUMAB (nye VOL ue mab) treats some types of cancer. It works by helping your immune system slow or stop the spread of cancer cells. It is a monoclonal antibody. This medicine may be used for other purposes; ask your health care provider or pharmacist if you have questions. COMMON BRAND NAME(S): Opdivo What should I tell my care team before I take this medication? They need to know if you have any of these conditions: Allogeneic stem cell transplant (uses someone else's stem cells) Autoimmune diseases, such as Crohn disease, ulcerative colitis, lupus History of chest radiation Nervous system problems, such as Guillain-Barre syndrome or myasthenia gravis Organ transplant An unusual or allergic reaction to nivolumab, other medications, foods, dyes, or preservatives Pregnant or trying to get pregnant Breast-feeding How should I use this medication? This medication is infused into a vein. It is given in a hospital or clinic setting. A special MedGuide will be given to you before each treatment. Be sure to read this information carefully each time. Talk to your care team about the use of this medication in children. While it may be prescribed for children as young as 12 years for selected conditions, precautions do apply. Overdosage: If you think you have taken too much of this medicine contact a poison control center or emergency room at once. NOTE: This medicine is only for you. Do not share this medicine with others. What if I miss a dose? Keep appointments for follow-up doses. It is important not to miss your dose. Call your care team if you are unable to keep an appointment. What may interact with this medication? Interactions have not been studied. This list may not describe all possible interactions. Give your health care provider a list of all the medicines, herbs, non-prescription drugs, or dietary supplements you use. Also tell them if you  smoke, drink alcohol, or use illegal drugs. Some items may interact with your medicine. What should I watch for while using this medication? Your condition will be monitored carefully while you are receiving this medication. You may need blood work while taking this medication. This medication may cause serious skin reactions. They can happen weeks to months after starting the medication. Contact your care team right away if you notice fevers or flu-like symptoms with a rash. The rash may be red or purple and then turn into blisters or peeling of the skin. You may also notice a red rash with swelling of the face, lips, or lymph nodes in your neck or under your arms. Tell your care team right away if you have any change in your eyesight. Talk to your care team if you are pregnant or think you might be pregnant. A negative pregnancy test is required before starting this medication. A reliable form of contraception is recommended while taking this medication and for 5 months after the last dose. Talk to your care team about effective forms of contraception. Do not breast-feed while taking this medication and for 5 months after the last dose. What side effects may I notice from receiving this medication? Side effects that you should report to your care team as soon as possible: Allergic reactions--skin rash, itching, hives, swelling of the face, lips, tongue, or throat Dry cough, shortness of breath or trouble breathing Eye pain, redness, irritation, or discharge with blurry or decreased vision Heart muscle inflammation--unusual weakness or fatigue, shortness of breath, chest pain, fast or irregular heartbeat, dizziness, swelling of the ankles, feet, or hands Hormone   gland problems--headache, sensitivity to light, unusual weakness or fatigue, dizziness, fast or irregular heartbeat, increased sensitivity to cold or heat, excessive sweating, constipation, hair loss, increased thirst or amount of urine,  tremors or shaking, irritability Infusion reactions--chest pain, shortness of breath or trouble breathing, feeling faint or lightheaded Kidney injury (glomerulonephritis)--decrease in the amount of urine, red or dark brown urine, foamy or bubbly urine, swelling of the ankles, hands, or feet Liver injury--right upper belly pain, loss of appetite, nausea, light-colored stool, dark yellow or brown urine, yellowing skin or eyes, unusual weakness or fatigue Pain, tingling, or numbness in the hands or feet, muscle weakness, change in vision, confusion or trouble speaking, loss of balance or coordination, trouble walking, seizures Rash, fever, and swollen lymph nodes Redness, blistering, peeling, or loosening of the skin, including inside the mouth Sudden or severe stomach pain, bloody diarrhea, fever, nausea, vomiting Side effects that usually do not require medical attention (report these to your care team if they continue or are bothersome): Bone, joint, or muscle pain Diarrhea Fatigue Loss of appetite Nausea Skin rash This list may not describe all possible side effects. Call your doctor for medical advice about side effects. You may report side effects to FDA at 1-800-FDA-1088. Where should I keep my medication? This medication is given in a hospital or clinic. It will not be stored at home. NOTE: This sheet is a summary. It may not cover all possible information. If you have questions about this medicine, talk to your doctor, pharmacist, or health care provider.  2023 [employer] Standard (2014-06-29 00:00:00)  

## 2023-01-09 ENCOUNTER — Other Ambulatory Visit: Payer: Self-pay

## 2023-01-19 ENCOUNTER — Encounter: Payer: Self-pay | Admitting: Oncology

## 2023-01-19 DIAGNOSIS — Z8673 Personal history of transient ischemic attack (TIA), and cerebral infarction without residual deficits: Secondary | ICD-10-CM | POA: Diagnosis not present

## 2023-01-19 DIAGNOSIS — I700 Atherosclerosis of aorta: Secondary | ICD-10-CM | POA: Diagnosis not present

## 2023-01-19 DIAGNOSIS — I10 Essential (primary) hypertension: Secondary | ICD-10-CM | POA: Diagnosis not present

## 2023-01-31 DIAGNOSIS — R911 Solitary pulmonary nodule: Secondary | ICD-10-CM | POA: Diagnosis not present

## 2023-01-31 DIAGNOSIS — I700 Atherosclerosis of aorta: Secondary | ICD-10-CM | POA: Diagnosis not present

## 2023-01-31 DIAGNOSIS — C792 Secondary malignant neoplasm of skin: Secondary | ICD-10-CM | POA: Diagnosis not present

## 2023-01-31 NOTE — Progress Notes (Signed)
[facility]  [address] [place],  [place]  [address] [phone]  Clinic Day: 02/01/23  Referring physician: [name], MD  ASSESSMENT & PLAN:  Assessment: Malignant melanoma of the left foot He had a punch biopsy and this reveals a Breslow thickness of 1.6 mm and Clark's level 4 with ulceration for a T2b N0 M0 clinically.  We found no distant metastasis on PET scan, but he did have metastasis to the left lower extremity.  His tumor is positive for BRAF mutation. He had this treated with wide local excision.   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   Multiple lesions of the lung These were suspicious for metastatic disease but have now resolved on the CT scan on 08/12/22.  The PET scan was negative but we cannot absolutely rule out metastatic disease since these lesions are small. We discussed the fact that the resolution of these lesions could represent a response to treatment, or may not have been malignant at all. I think it is more likely the former, and so recommend continuation of therapy.  We have had several discussions about this.  Spinal stenosis at L4-5 He has had surgical decompression and resection of a synovial cyst as of January 26 2022.  Leukocytosis He has had chronic leukocytosis and no symptoms of infection and urinalysis and urine culture were negative.  We did not find an explanation but the [facility] continue to fluctuate up and down.  Lymphedema of the Left Lower Extremity We discussed this diagnosis and the limited approach is available. For now we will just monitor it.   Plan: He had a whole body PET scan on 01/31/2023 which revealed interval resolution of previously demonstrated hypermetabolic activity within the left foot, no evidence of local  recurrence of metastatic disease, resolution of right middle lobe airspace diseases seen on most recent CT. No suspicious pulmonary nodularity, new opacification of the maxillary sinus with mild peripheral hypermetabolic activity, likely inflammatory, and aortic atherosclerosis. I will give him a copy of the PET scan to take home. He is scheduled for his day 1 cycle 13 of Nivolumab on 02/05/2023 and I stressed the importance for him to stay on the treatment every 4 weeks as long as he is tolerating it to prevent recurrence. They inquired about what would be the next steps of when his treatment stops working and I explained that he has a BRAF and PDL1 mutation so that gives [place] other medication options to try when the time comes. I advised him to take one oral calcium supplement once daily. I gave them a dietary sheet of calcium containing foods to take home. I will refill Famotidine. His labs today are pending. I will see him back in 4 weeks with CBC, CMP, TSH, and T4. The patient was provided an opportunity to ask questions and all were answered.  The patient agreed with the plan and demonstrated an understanding of the instructions.  The patient was advised to call back if the symptoms worsen or if the condition fails to improve as anticipated.   I provided 25  minutes of face-to-face time during this this encounter and > 50% was spent counseling as documented under my assessment and plan.   Benjamin Beckwith, MD [facility]  AT [facility] [address] [place] [address] Dept: [phone] Dept Fax: [phone]   CHIEF COMPLAINT:  CC: Malignant melanoma  Current Treatment: Evaluation and immunotherapy   HISTORY OF PRESENT ILLNESS:  [name] is a 77 y.o. male with a history of malignant melanoma who is referred in consultation with Dr. Luna Anthony for assessment and management.  He had a lesion of the dorsum of the left foot in September  2022 which appeared to be a granuloma and was treated in the office.  He later developed a another lesion lateral to this on the dorsum of the left foot which was not hyperpigmented but did bleed easily.  He also has several lesions of his left medial calf which are nodular and span approximately 6 cm.  He has had some increased edema of the left lower leg.  He was evaluated by dermatology on June 5, who felt this could be a squamous cell carcinoma versus tinea or a drug reaction.  He had biopsies done of both the foot lesion and the Lesion.  Pathology came back that the left dorsal foot shave biopsy revealed a malignant melanoma possibly a primary nodular ulcerated melanoma which had a Breslow thickness of at least 1.6 mm and a Clark's level 4.  Margins are positive as this was a shave biopsy.  Ulceration is present but no satellitosis.  The mitotic index is 4/mm and no lymphovascular invasion was identified.  No brisk tumor infiltrating lymphocytes are seen and tumor regression is absent.  This is felt to be at least a T2b lesion and wide excision was recommended.  The lesion of the left medial calf revealed a dermal map melanoma most consistent with metastatic disease.  This information was not available when the patient was admitted for back surgery on June 8 and he had a microlumbar decompression at L4-5 for spinal stenosis.  He was also found to have a synovial cyst at that time which was resected and benign.  He was readmitted several days later for acute pain of the right elbow and both arms.  Aspiration was obtained and this was diagnosed as gouty arthritis.  He did have temporary rehab after his back surgery.  His left leg was evaluated for deep venous thrombosis and was negative.  While he was recuperating from his surgery he had an x-ray which was abnormal which led to a CT scan which revealed at least 3 lung lesions.  MRI of the brain was negative and he is now referred for management of the  malignant melanoma.  Oncology History  Malignant melanoma, metastatic (HCC)  01/23/2022 Cancer Staging   Staging form: Melanoma of the Skin, AJCC 8th Edition - Clinical stage from 01/23/2022: Stage III (cT2b, cN1c, cM0) - Signed by Benjamin Beckwith, MD on 04/07/2022 Histopathologic type: Malignant melanoma, NOS (except juvenile melanoma M-8770/[id number]) Stage prefix: Initial diagnosis Laterality: Left Lymph-vascular invasion (LVI): LVI not present (absent)/not identified Diagnostic confirmation: Positive histology Specimen type: Biopsy / Limited Resection Staged by: Managing physician Mitotic count: 4 Mitotic unit: mm2 Clark's level: Level IV Tumor-infiltrating lymphocytes: Present and non-brisk Breslow depth (mm): 16 Ulceration of the epidermis: Yes Microsatellites: No Primary tumor regression: Absent Intransit/satellite metastasis: In-transit Lymph node clinically or radiologically detected: No Microscopic confirmation of metastasis: No Sentinel lymph node biopsy performed: No Completion or therapeutic lymph node dissection performed: No Matted nodes: No Stage used in treatment planning: Yes National guidelines used in treatment planning: Yes Type of national guideline used in treatment planning:  NCCN   01/31/2022 Initial Diagnosis   Malignant melanoma, metastatic (HCC)   04/14/2022 -  Chemotherapy   Patient is on Treatment Plan : MELANOMA Nivolumab (480) q28d (changed from q14d on 12/4)     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 [facility])  02/28/2022 Cancer Staging   Staging form: Melanoma of the Skin, AJCC 8th Edition - Clinical stage from 02/28/2022: Stage IV (cT2b(m), cN1c, cM1a) - Signed by Benjamin Beckwith, MD on 04/07/2022 Histopathologic type: Malignant melanoma, NOS (except juvenile melanoma M-8770/[id number]) Stage prefix: Initial diagnosis Laterality: Left Multiple tumors: Yes Lymph-vascular invasion (LVI): LVI present/identified, NOS Diagnostic confirmation: Positive  histology Specimen type: Excision Staged by: Managing physician Intransit/satellite metastasis: Both in-transit and satellite Prognostic indicators: 02/28/22:  Wide excision of primary of foot - no residual melanoma,  Excision of skin of calf consistent with intransit mets of dermal/subcuticular   Melanoma with LVI, metastatic disease with multifocal lesions and multifocal positive margins 03/24/22: Re-excision with a few atypical melanocytes, no melanoma and clear margins   04/07/2022 Initial Diagnosis   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 Patient started Nivolumab and Ipilimumab on 04/15/2023.  Patient states that he feels okay and has no complaint of pain. His wife asked if his left lower leg will continue to swell. I advised that it wouldn't most likely go down to normal but I can recommend a lymphedema specialist and explained to them what that is for. I advised them that he can try a compression sock to help but it wouldn't solve the problem. He asked about the changes in his arm. 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 He had a whole body PET scan on 01/31/2023 which revealed interval resolution of previously demonstrated hypermetabolic activity within the left foot, no evidence of local recurrence of metastatic disease, resolution of right middle lobe airspace diseases seen on most recent CT. No suspicious pulmonary nodularity, new opacification of the maxillary sinus with mild peripheral hypermetabolic activity, likely inflammatory, and aortic atherosclerosis. I will give him a copy of the PET scan to take home. He is scheduled for his day 1  cycle 13 of Nivolumab on 02/05/2023 and I stressed the importance for him to stay on the treatment every 4 weeks as long as he is tolerating it to prevent recurrence. They inquired about what would be the next steps of when his treatment stops working and I explained that he has a BRAF and PDL1 mutation so that gives [place] other medication options to try when the time comes. I advised him to take one oral calcium supplement once daily. I gave them a dietary sheet of calcium containing foods to take home. I will refill Famotidine. His labs today are pending. I will see him back in 4 weeks with CBC, CMP, TSH, and T4.   He denies signs of infection such as sore throat, sinus drainage, cough, or urinary symptoms.  He denies fevers or recurrent chills. He denies pain. He denies nausea, vomiting, chest pain, dyspnea or cough. His appetite is good and his weight has increased 6 pounds over last 1 month .  He is accompanied by his wife at today's visit.     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 , 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    Past Surgical History:  Procedure Laterality Date   LUMBAR LAMINECTOMY/DECOMPRESSION MICRODISCECTOMY N/A 01/26/2022   Procedure: Microlumbar decompression Lumbar four-five, lateral mass fusion with autograft and allograft bone;  Surgeon: Jene Every, MD;  Location: [facility] OR;  Service: Orthopedics;  Laterality: N/A;   PARTIAL GASTRECTOMY     TOTAL HIP ARTHROPLASTY Right 06/18/2018   Procedure: RIGHT TOTAL HIP ARTHROPLASTY ANTERIOR APPROACH;  Surgeon: Durene Romans, MD;  Location: [facility] ORS;  Service: Orthopedics;   Laterality: Right;    Family History  Problem Relation Age of Onset   Lung cancer Sister    Melanoma Brother     Social History:  reports that he has quit smoking. He has never used smokeless tobacco. He reports that he does not currently use alcohol. He reports that he does not use drugs.    Allergies:  Allergies  Allergen Reactions   Morphine Itching    Patient states it is tolerable itching    Current Medications: Current Outpatient Medications  Medication Sig Dispense Refill   amLODipine (NORVASC) 5 MG tablet Take 5 mg by mouth daily.     clopidogrel (PLAVIX) 75 MG tablet Take 1 tablet by mouth daily.     docusate sodium (COLACE) 100 MG capsule 1 capsule 2 TIMES DAILY (route: oral)     famotidine (PEPCID) 40 MG tablet Take 1 tablet (40 mg total) by mouth daily. 30 tablet 5   Flaxseed, Linseed, (FLAXSEED OIL) 1000 MG CAPS Take 1,000 mg by mouth daily.     folic acid (FOLVITE) 400 MCG tablet Take 400 mcg by mouth daily.     gabapentin (NEURONTIN) 300 MG capsule Take 1 capsule (300 mg total) by mouth 3 (three) times daily. 90 capsule 2   hydrochlorothiazide (HYDRODIURIL) 12.5 MG tablet Take 12.5 mg by mouth daily.     lisinopril (ZESTRIL) 20 MG tablet      Multiple Vitamin (MULTIVITAMIN WITH MINERALS) TABS tablet Take 1 tablet by mouth in the morning.     mupirocin ointment (BACTROBAN) 2 % Apply topically.     Omega-3 1000 MG CAPS Take by mouth.     ondansetron (ZOFRAN) 4 MG tablet Take 1 tablet (4 mg total) by mouth every 4 (four) hours as needed for nausea. 90 tablet 3   oxyCODONE (OXY IR/ROXICODONE) 5 MG immediate release tablet Take 1 tablet (5 mg total) by mouth every 6 (six) hours as needed for severe pain. 30 tablet 0   polyethylene glycol (MIRALAX / GLYCOLAX) 17 g packet Take 17 g by mouth daily. 14 each 0   pravastatin (PRAVACHOL) 40 MG tablet Take 40 mg by mouth daily.     predniSONE (STERAPRED UNI-PAK 21 TAB) 5 MG (21) TBPK tablet See admin instructions. follow  package directions     prochlorperazine (COMPAZINE) 10 MG tablet Take 1 tablet (10 mg total) by mouth every 6 (six)  hours as needed for nausea or vomiting. 90 tablet 3   traZODone (DESYREL) 50 MG tablet Take 1 tablet (50 mg total) by mouth at bedtime as needed for sleep. 30 tablet 5   No current facility-administered medications for this visit.    REVIEW OF SYSTEMS:  Review of Systems  Constitutional: Negative.  Negative for appetite change, chills, diaphoresis, fatigue, fever and unexpected weight change.  HENT:  Negative.  Negative for hearing loss, lump/mass, mouth sores, nosebleeds, sore throat, tinnitus, trouble swallowing and voice change.   Eyes: Negative.  Negative for eye problems and icterus.  Respiratory: Negative.  Negative for chest tightness, cough, hemoptysis, shortness of breath and wheezing.   Cardiovascular:  Positive for leg swelling (LLE and leg pain). Negative for chest pain and palpitations.  Gastrointestinal: Negative.  Negative for abdominal distention, abdominal pain, blood in stool, constipation, diarrhea, nausea, rectal pain and vomiting.  Endocrine: Negative.  Negative for hot flashes.  Genitourinary: Negative.  Negative for bladder incontinence, difficulty urinating, dyspareunia, dysuria, frequency, hematuria, nocturia, pelvic pain and penile discharge.   Musculoskeletal:  Positive for back pain. Negative for arthralgias, flank pain, gait problem, myalgias, neck pain and neck stiffness.  Skin: Negative.  Negative for itching, rash and wound.       Well healed scar below the knee.  Neurological: Negative.  Negative for dizziness, extremity weakness, gait problem, headaches, light-headedness, numbness, seizures and speech difficulty.  Hematological: Negative.  Negative for adenopathy. Does not bruise/bleed easily.  Psychiatric/Behavioral: Negative.  Negative for confusion, decreased concentration, depression, sleep disturbance and suicidal ideas. The patient is not  nervous/anxious.      VITALS:  Blood pressure (!) 146/75, pulse 65, temperature 97.6 F (36.4 C), temperature source Oral, resp. rate 18, height 5' 9.39" (1.763 m), weight 236 lb 9.6 oz (107.3 kg), SpO2 98 %.  Wt Readings from Last 3 Encounters:  02/05/23 239 lb (108.4 kg)  02/01/23 236 lb 9.6 oz (107.3 kg)  01/08/23 236 lb 1.9 oz (107.1 kg)    Body mass index is 34.55 kg/m.  Performance status (ECOG): 1 - Symptomatic but completely ambulatory  PHYSICAL EXAM:  Physical Exam Vitals and nursing note reviewed. Exam conducted with a chaperone present.  Constitutional:      General: He is not in acute distress.    Appearance: Normal appearance. He is normal weight. He is not ill-appearing, toxic-appearing or diaphoretic.  HENT:     Head: Normocephalic and atraumatic.     Right Ear: Tympanic membrane, ear canal and external ear normal. There is no impacted cerumen.     Left Ear: Tympanic membrane, ear canal and external ear normal. There is no impacted cerumen.     Nose: Nose normal. No congestion or rhinorrhea.     Mouth/Throat:     Mouth: Mucous membranes are moist.     Pharynx: Oropharynx is clear. No oropharyngeal exudate or posterior oropharyngeal erythema.  Eyes:     General: No scleral icterus.       Right eye: No discharge.        Left eye: No discharge.     Extraocular Movements: Extraocular movements intact.     Conjunctiva/sclera: Conjunctivae normal.     Pupils: Pupils are equal, round, and reactive to light.  Neck:     Vascular: No carotid bruit.  Cardiovascular:     Rate and Rhythm: Normal rate and regular rhythm.     Pulses: Normal pulses.     Heart sounds: Normal heart sounds. No murmur  heard.    No friction rub. No gallop.  Pulmonary:     Effort: Pulmonary effort is normal. No respiratory distress.     Breath sounds: Normal breath sounds. No stridor. No wheezing, rhonchi or rales.  Chest:     Chest wall: No tenderness.  Abdominal:     General: Bowel sounds  are normal. There is no distension.     Palpations: Abdomen is soft. There is no hepatomegaly, splenomegaly or mass.     Tenderness: There is no abdominal tenderness. There is no right CVA tenderness, left CVA tenderness, guarding or rebound.     Hernia: No hernia is present.  Musculoskeletal:        General: No swelling, tenderness, deformity or signs of injury. Normal range of motion.     Cervical back: Normal range of motion and neck supple. No rigidity or tenderness.     Right lower leg: No edema.     Left lower leg: 1+ Edema present.     Comments: Wound of the medial left knee is well healed with extensive scar tissue.   Lymphadenopathy:     Cervical: No cervical adenopathy.     Upper Body:     Right upper body: No supraclavicular or axillary adenopathy.     Left upper body: No supraclavicular or axillary adenopathy.     Lower Body: No right inguinal adenopathy. No left inguinal adenopathy.  Skin:    General: Skin is warm and dry.     Coloration: Skin is not jaundiced or pale.     Findings: No bruising, erythema, lesion or rash.  Neurological:     General: No focal deficit present.     Mental Status: He is alert and oriented to person, place, and time. Mental status is at baseline.     Cranial Nerves: No cranial nerve deficit.     Sensory: No sensory deficit.     Motor: No weakness.     Coordination: Coordination normal.     Gait: Gait normal.     Deep Tendon Reflexes: Reflexes normal.  Psychiatric:        Mood and Affect: Mood normal.        Behavior: Behavior normal.        Thought Content: Thought content normal.        Judgment: Judgment normal.     LABS:      Latest Ref Rng & Units 02/01/2023    2:23 PM 01/04/2023    1:23 PM 12/07/2022    1:36 PM  CBC  WBC 4.0 - 10.5 K/uL 12.8  17.1  11.0   Hemoglobin 13.0 - 17.0 g/dL 40.9  81.1  91.4   Hematocrit 39.0 - 52.0 % 41.2  45.6  41.7   Platelets 150 - 400 K/uL 247  360  223       Latest Ref Rng & Units 02/01/2023     2:23 PM 01/04/2023    1:23 PM 12/07/2022    1:36 PM  CMP  Glucose 70 - 99 mg/dL 93  782  956   BUN 8 - 23 mg/dL 23  27  27    Creatinine 0.61 - 1.24 mg/dL 2.13  0.86  5.78   Sodium 135 - 145 mmol/L 137  137  137   Potassium 3.5 - 5.1 mmol/L 3.9  4.2  4.0   Chloride 98 - 111 mmol/L 106  105  104   CO2 22 - 32 mmol/L 22  22  24  Calcium 8.9 - 10.3 mg/dL 8.8  8.8  8.7   Total Protein 6.5 - 8.1 g/dL 7.1  7.9  7.2   Total Bilirubin 0.3 - 1.2 mg/dL 0.6  0.6  1.0   Alkaline Phos 38 - 126 U/L 65  66  78   AST 15 - 41 U/L 22  26  24    ALT 0 - 44 U/L 20  22  21              Component Ref Range & Units 01/04/2023   1 mo ago 2 mo ago 3 mo ago 6 mo ago 8 mo ago 9 mo ago  TSH 0.350 - 4.500 uIU/mL 1.052 1.468 CM 1.490 CM 1.551 CM 1.223 CM 1.060 CM 1.166 CM     Component Ref Range & Units 01/04/2023 1 mo ago 2 mo ago 3 mo ago 6 mo ago 8 mo ago 9 mo ago  T4, Total 4.5 - 12.0 ug/dL 8.8 8.3 CM 9.3 CM 8.1 CM 7.4 CM 7.5 CM 8.0 CM      No results found for: "CEA1", "CEA" / No results found for: "CEA1", "CEA" No results found for: "PSA1" No results found for: "GEX528" No results found for: "CAN125"  No results found for: "TOTALPROTELP", "ALBUMINELP", "A1GS", "A2GS", "BETS", "BETA2SER", "GAMS", "MSPIKE", "SPEI" No results found for: "TIBC", "FERRITIN", "IRONPCTSAT" Lab Results  Component Value Date   LDH 140 02/01/2023   LDH 160 02/07/2022    STUDIES:  No results found.  Exam: 01/31/23 PET WHOLE BODY IMPRESSION: Interval resolution of previously demonstrated hypermetabolic activity within the left foot. No evidence of local recurrence of metastatic disease. Resolution of right middle lobe airspace diseases seen on most recent CT. No suspicious pulmonary nodularity. New opacification of the maxillary sinus with mild peripheral hypermetabolic activity, likely inflammatory Aortic Atherosclerosis  Exam: 08/16/22 CT Chest, Abdomen, and Pelvis with contrast Chest Impressions: The 2  dominant nodules in the RIGHT lung previously documented are not readily identified; however, there is a new branching nodularity/consolidation in RIGHT middle lobe. RIGHT middle lobe findings favor infectious or inflammatory process. No metastatic mediastinal adenopathy identified No cutaneous lesion.  Abdomen / Pelvis Impression: No evidence of metastatic disease in the abdomen pelvis. Benign Bosniak 1 renal cysts. No follow-up recommended. No lymphadenopathy or peritoneal metastasis.     I,Jasmine M Lassiter,acting as a scribe for Benjamin Beckwith, MD.,have documented all relevant documentation on the behalf of Benjamin Beckwith, MD,as directed by  Benjamin Beckwith, MD while in the presence of Benjamin Beckwith, MD.  I have reviewed this report as typed by the medical scribe, and it is complete and accurate.  [name]   02/11/23 4:18 PM

## 2023-02-01 ENCOUNTER — Encounter: Payer: Self-pay | Admitting: Oncology

## 2023-02-01 ENCOUNTER — Inpatient Hospital Stay: Payer: Medicare HMO | Attending: Oncology | Admitting: Oncology

## 2023-02-01 ENCOUNTER — Other Ambulatory Visit: Payer: Self-pay

## 2023-02-01 ENCOUNTER — Inpatient Hospital Stay: Payer: Medicare HMO

## 2023-02-01 ENCOUNTER — Other Ambulatory Visit: Payer: Self-pay | Admitting: Oncology

## 2023-02-01 VITALS — BP 146/75 | HR 65 | Temp 97.6°F | Resp 18 | Ht 69.39 in | Wt 236.6 lb

## 2023-02-01 DIAGNOSIS — K219 Gastro-esophageal reflux disease without esophagitis: Secondary | ICD-10-CM

## 2023-02-01 DIAGNOSIS — R918 Other nonspecific abnormal finding of lung field: Secondary | ICD-10-CM | POA: Diagnosis not present

## 2023-02-01 DIAGNOSIS — C792 Secondary malignant neoplasm of skin: Secondary | ICD-10-CM

## 2023-02-01 DIAGNOSIS — C439 Malignant melanoma of skin, unspecified: Secondary | ICD-10-CM

## 2023-02-01 DIAGNOSIS — Z5112 Encounter for antineoplastic immunotherapy: Secondary | ICD-10-CM | POA: Insufficient documentation

## 2023-02-01 DIAGNOSIS — Z79899 Other long term (current) drug therapy: Secondary | ICD-10-CM | POA: Insufficient documentation

## 2023-02-01 DIAGNOSIS — C4372 Malignant melanoma of left lower limb, including hip: Secondary | ICD-10-CM | POA: Diagnosis not present

## 2023-02-01 LAB — CMP (CANCER CENTER ONLY)
ALT: 20 U/L (ref 0–44)
AST: 22 U/L (ref 15–41)
Albumin: 3.9 g/dL (ref 3.5–5.0)
Alkaline Phosphatase: 65 U/L (ref 38–126)
Anion gap: 9 (ref 5–15)
BUN: 23 mg/dL (ref 8–23)
CO2: 22 mmol/L (ref 22–32)
Calcium: 8.8 mg/dL — ABNORMAL LOW (ref 8.9–10.3)
Chloride: 106 mmol/L (ref 98–111)
Creatinine: 1.08 mg/dL (ref 0.61–1.24)
GFR, Estimated: >60 mL/min (ref >60)
Glucose, Bld: 93 mg/dL (ref 70–99)
Potassium: 3.9 mmol/L (ref 3.5–5.1)
Sodium: 137 mmol/L (ref 135–145)
Total Bilirubin: 0.6 mg/dL (ref 0.3–1.2)
Total Protein: 7.1 g/dL (ref 6.5–8.1)

## 2023-02-01 LAB — CBC WITH DIFFERENTIAL (CANCER CENTER ONLY)
Abs Immature Granulocytes: 0.03 10*3/uL (ref 0.00–0.07)
Basophils Absolute: 0 10*3/uL (ref 0.0–0.1)
Basophils Relative: 0 %
Eosinophils Absolute: 0.5 10*3/uL (ref 0.0–0.5)
Eosinophils Relative: 4 %
HCT: 41.2 % (ref 39.0–52.0)
Hemoglobin: 13.4 g/dL (ref 13.0–17.0)
Immature Granulocytes: 0 %
Lymphocytes Relative: 24 %
Lymphs Abs: 3.1 10*3/uL (ref 0.7–4.0)
MCH: 28.7 pg (ref 26.0–34.0)
MCHC: 32.5 g/dL (ref 30.0–36.0)
MCV: 88.2 fL (ref 80.0–100.0)
Monocytes Absolute: 1.1 10*3/uL — ABNORMAL HIGH (ref 0.1–1.0)
Monocytes Relative: 9 %
Neutro Abs: 8.1 10*3/uL — ABNORMAL HIGH (ref 1.7–7.7)
Neutrophils Relative %: 63 %
Platelet Count: 247 10*3/uL (ref 150–400)
RBC: 4.67 MIL/uL (ref 4.22–5.81)
RDW: 13.5 % (ref 11.5–15.5)
WBC Count: 12.8 10*3/uL — ABNORMAL HIGH (ref 4.0–10.5)
nRBC: 0 % (ref 0.0–0.2)

## 2023-02-01 LAB — TSH: TSH: 0.971 u[IU]/mL (ref 0.350–4.500)

## 2023-02-01 LAB — LACTATE DEHYDROGENASE: LDH: 140 U/L (ref 98–192)

## 2023-02-01 MED ORDER — FAMOTIDINE 40 MG PO TABS
40.0000 mg | ORAL_TABLET | Freq: Every day | ORAL | 5 refills | Status: DC
Start: 2023-02-01 — End: 2023-02-20

## 2023-02-02 MED FILL — Nivolumab IV Soln 240 MG/24ML: INTRAVENOUS | Qty: 48 | Status: AC

## 2023-02-03 LAB — T4: T4, Total: 7.2 ug/dL (ref 4.5–12.0)

## 2023-02-05 ENCOUNTER — Encounter: Payer: Self-pay | Admitting: Oncology

## 2023-02-05 ENCOUNTER — Inpatient Hospital Stay: Payer: Medicare HMO

## 2023-02-05 VITALS — BP 155/70 | HR 75 | Temp 98.2°F | Resp 16 | Ht 69.39 in | Wt 239.0 lb

## 2023-02-05 DIAGNOSIS — C4372 Malignant melanoma of left lower limb, including hip: Secondary | ICD-10-CM | POA: Diagnosis not present

## 2023-02-05 DIAGNOSIS — Z79899 Other long term (current) drug therapy: Secondary | ICD-10-CM | POA: Diagnosis not present

## 2023-02-05 DIAGNOSIS — C792 Secondary malignant neoplasm of skin: Secondary | ICD-10-CM

## 2023-02-05 DIAGNOSIS — Z5112 Encounter for antineoplastic immunotherapy: Secondary | ICD-10-CM | POA: Diagnosis not present

## 2023-02-05 DIAGNOSIS — C439 Malignant melanoma of skin, unspecified: Secondary | ICD-10-CM

## 2023-02-05 MED ORDER — HEPARIN SOD (PORK) LOCK FLUSH 100 UNIT/ML IV SOLN
500.0000 [IU] | Freq: Once | INTRAVENOUS | Status: AC | PRN
  Administered 2023-02-05: 500 [IU]

## 2023-02-05 MED ORDER — SODIUM CHLORIDE 0.9% FLUSH
10.0000 mL | INTRAVENOUS | Status: DC | PRN
  Administered 2023-02-05: 10 mL

## 2023-02-05 MED ORDER — SODIUM CHLORIDE 0.9 % IV SOLN: Freq: Once | INTRAVENOUS | Status: AC

## 2023-02-05 MED ORDER — SODIUM CHLORIDE 0.9 % IV SOLN
480.0000 mg | Freq: Once | INTRAVENOUS | Status: AC
  Administered 2023-02-05: 480 mg via INTRAVENOUS
  Filled 2023-02-05: qty 48

## 2023-02-05 NOTE — Patient Instructions (Signed)
Nivolumab Injection What is this medication? NIVOLUMAB (nye VOL ue mab) treats some types of cancer. It works by helping your immune system slow or stop the spread of cancer cells. It is a monoclonal antibody. This medicine may be used for other purposes; ask your health care provider or pharmacist if you have questions. COMMON BRAND NAME(S): Opdivo What should I tell my care team before I take this medication? They need to know if you have any of these conditions: Allogeneic stem cell transplant (uses someone else's stem cells) Autoimmune diseases, such as Crohn disease, ulcerative colitis, lupus History of chest radiation Nervous system problems, such as Guillain-Barre syndrome or myasthenia gravis Organ transplant An unusual or allergic reaction to nivolumab, other medications, foods, dyes, or preservatives Pregnant or trying to get pregnant Breast-feeding How should I use this medication? This medication is infused into a vein. It is given in a hospital or clinic setting. A special MedGuide will be given to you before each treatment. Be sure to read this information carefully each time. Talk to your care team about the use of this medication in children. While it may be prescribed for children as young as 12 years for selected conditions, precautions do apply. Overdosage: If you think you have taken too much of this medicine contact a poison control center or emergency room at once. NOTE: This medicine is only for you. Do not share this medicine with others. What if I miss a dose? Keep appointments for follow-up doses. It is important not to miss your dose. Call your care team if you are unable to keep an appointment. What may interact with this medication? Interactions have not been studied. This list may not describe all possible interactions. Give your health care provider a list of all the medicines, herbs, non-prescription drugs, or dietary supplements you use. Also tell them if you  smoke, drink alcohol, or use illegal drugs. Some items may interact with your medicine. What should I watch for while using this medication? Your condition will be monitored carefully while you are receiving this medication. You may need blood work while taking this medication. This medication may cause serious skin reactions. They can happen weeks to months after starting the medication. Contact your care team right away if you notice fevers or flu-like symptoms with a rash. The rash may be red or purple and then turn into blisters or peeling of the skin. You may also notice a red rash with swelling of the face, lips, or lymph nodes in your neck or under your arms. Tell your care team right away if you have any change in your eyesight. Talk to your care team if you are pregnant or think you might be pregnant. A negative pregnancy test is required before starting this medication. A reliable form of contraception is recommended while taking this medication and for 5 months after the last dose. Talk to your care team about effective forms of contraception. Do not breast-feed while taking this medication and for 5 months after the last dose. What side effects may I notice from receiving this medication? Side effects that you should report to your care team as soon as possible: Allergic reactions--skin rash, itching, hives, swelling of the face, lips, tongue, or throat Dry cough, shortness of breath or trouble breathing Eye pain, redness, irritation, or discharge with blurry or decreased vision Heart muscle inflammation--unusual weakness or fatigue, shortness of breath, chest pain, fast or irregular heartbeat, dizziness, swelling of the ankles, feet, or hands Hormone   gland problems--headache, sensitivity to light, unusual weakness or fatigue, dizziness, fast or irregular heartbeat, increased sensitivity to cold or heat, excessive sweating, constipation, hair loss, increased thirst or amount of urine,  tremors or shaking, irritability Infusion reactions--chest pain, shortness of breath or trouble breathing, feeling faint or lightheaded Kidney injury (glomerulonephritis)--decrease in the amount of urine, red or dark brown urine, foamy or bubbly urine, swelling of the ankles, hands, or feet Liver injury--right upper belly pain, loss of appetite, nausea, light-colored stool, dark yellow or brown urine, yellowing skin or eyes, unusual weakness or fatigue Pain, tingling, or numbness in the hands or feet, muscle weakness, change in vision, confusion or trouble speaking, loss of balance or coordination, trouble walking, seizures Rash, fever, and swollen lymph nodes Redness, blistering, peeling, or loosening of the skin, including inside the mouth Sudden or severe stomach pain, bloody diarrhea, fever, nausea, vomiting Side effects that usually do not require medical attention (report these to your care team if they continue or are bothersome): Bone, joint, or muscle pain Diarrhea Fatigue Loss of appetite Nausea Skin rash This list may not describe all possible side effects. Call your doctor for medical advice about side effects. You may report side effects to FDA at 1-800-FDA-1088. Where should I keep my medication? This medication is given in a hospital or clinic. It will not be stored at home. NOTE: This sheet is a summary. It may not cover all possible information. If you have questions about this medicine, talk to your doctor, pharmacist, or health care provider.  2024 [employer] (2021-12-05 00:00:00)  

## 2023-02-06 ENCOUNTER — Telehealth: Payer: Self-pay

## 2023-02-06 NOTE — Telephone Encounter (Signed)
Patient notified of lab results

## 2023-02-06 NOTE — Telephone Encounter (Signed)
-----   Message from [name], MD sent at 02/06/2023  2:56 PM EDT ----- Regarding: call Tell him labs  look good

## 2023-02-09 ENCOUNTER — Encounter: Payer: Self-pay | Admitting: Oncology

## 2023-02-11 ENCOUNTER — Encounter: Payer: Self-pay | Admitting: Oncology

## 2023-02-15 ENCOUNTER — Other Ambulatory Visit: Payer: Medicare HMO

## 2023-02-15 ENCOUNTER — Ambulatory Visit: Payer: Medicare HMO | Admitting: Oncology

## 2023-02-20 ENCOUNTER — Other Ambulatory Visit: Payer: Self-pay | Admitting: Oncology

## 2023-02-20 ENCOUNTER — Ambulatory Visit: Payer: Medicare HMO

## 2023-02-20 DIAGNOSIS — C439 Malignant melanoma of skin, unspecified: Secondary | ICD-10-CM

## 2023-02-20 DIAGNOSIS — K219 Gastro-esophageal reflux disease without esophagitis: Secondary | ICD-10-CM

## 2023-02-20 MED ORDER — TRAZODONE HCL 50 MG PO TABS
50.0000 mg | ORAL_TABLET | Freq: Every evening | ORAL | 5 refills | Status: DC | PRN
Start: 2023-02-20 — End: 2023-07-17

## 2023-02-20 MED ORDER — FAMOTIDINE 40 MG PO TABS
40.0000 mg | ORAL_TABLET | Freq: Every day | ORAL | 5 refills | Status: DC
Start: 2023-02-20 — End: 2023-07-23

## 2023-02-23 NOTE — Progress Notes (Signed)
[facility]  [address] [place],  [place]  [address] [phone]  Clinic Day: 03/01/23  Referring physician: [name], MD  ASSESSMENT & PLAN:  Assessment: Malignant melanoma of the left foot He had a punch biopsy and this reveals a Breslow thickness of 1.6 mm and Clark's level 4 with ulceration for a T2b N0 M0 clinically.  We found no distant metastasis on PET scan, but he did have metastasis to the left lower extremity.  His tumor is positive for BRAF and PDL1 mutation. He had this treated with wide local excision.   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   Multiple lesions of the lung These were suspicious for metastatic disease but have now resolved on the CT scan on 08/12/22.  The PET scan was negative but we cannot absolutely rule out metastatic disease since these lesions are small. We discussed the fact that the resolution of these lesions could represent a response to treatment, or may not have been malignant at all. I think it is more likely the former, and so I recommend continuation of therapy.  We have had several discussions about this.  Spinal stenosis at L4-5 He has had surgical decompression and resection of a synovial cyst as of January 26 2022.  Leukocytosis He has had chronic leukocytosis and no symptoms of infection and urinalysis and urine culture were negative.  We did not find an explanation but the [facility] continue to fluctuate up and down.  Lymphedema of the Left Lower Extremity We discussed this diagnosis and the limited approaches available. For now we will just monitor it.   Plan: He had a whole body PET scan on 01/31/2023 which revealed interval resolution of previously demonstrated hypermetabolic activity within the left foot, no  evidence of local recurrence of metastatic disease, resolution of right middle lobe airspace diseases seen on most recent CT. No suspicious pulmonary nodularity, new opacification of the maxillary sinus with mild peripheral hypermetabolic activity, likely inflammatory, and aortic atherosclerosis. He continues to have vitiligo like patches and pink skin (worsened from sun exposure) on his upper and lower extremities, this is likely caused from his medication. His day 1 cycle 14 of Nivolumab is scheduled on 03/05/2023. His labs today are pending. I will see him back in 4 weeks with CBC and CMP. The patient was provided an opportunity to ask questions and all were answered.  The patient agreed with the plan and demonstrated an understanding of the instructions.  The patient was advised to call back if the symptoms worsen or if the condition fails to improve as anticipated.   I provided 19  minutes of face-to-face time during this this encounter and > 50% was spent counseling as documented under my assessment and plan.   [name], MD [facility] AT [facility] [address] [place] [address] Dept: [phone] Dept Fax: [phone]   CHIEF COMPLAINT:  CC: Malignant melanoma  Current Treatment: Evaluation and immunotherapy   HISTORY OF PRESENT ILLNESS:  [name] is a 77 y.o. male with a history of malignant melanoma who is referred in consultation with Dr. Luna Kitchens for assessment and management.  He had a lesion of the dorsum of the left foot in September 2022 which appeared to be  a granuloma and was treated in the office.  He later developed a another lesion lateral to this on the dorsum of the left foot which was not hyperpigmented but did bleed easily.  He also has several lesions of his left medial calf which are nodular and span approximately 6 cm.  He has had some increased edema of the left lower leg.  He was evaluated by  dermatology on June 5, who felt this could be a squamous cell carcinoma versus tinea or a drug reaction.  He had biopsies done of both the foot lesion and the Lesion.  Pathology came back that the left dorsal foot shave biopsy revealed a malignant melanoma possibly a primary nodular ulcerated melanoma which had a Breslow thickness of at least 1.6 mm and a Clark's level 4.  Margins are positive as this was a shave biopsy.  Ulceration is present but no satellitosis.  The mitotic index is 4/mm and no lymphovascular invasion was identified.  No brisk tumor infiltrating lymphocytes are seen and tumor regression is absent.  This is felt to be at least a T2b lesion and wide excision was recommended.  The lesion of the left medial calf revealed a dermal map melanoma most consistent with metastatic disease.  This information was not available when the patient was admitted for back surgery on June 8 and he had a microlumbar decompression at L4-5 for spinal stenosis.  He was also found to have a synovial cyst at that time which was resected and benign.  He was readmitted several days later for acute pain of the right elbow and both arms.  Aspiration was obtained and this was diagnosed as gouty arthritis.  He did have temporary rehab after his back surgery.  His left leg was evaluated for deep venous thrombosis and was negative.  While he was recuperating from his surgery he had an x-ray which was abnormal which led to a CT scan which revealed at least 3 lung lesions.  MRI of the brain was negative and he is now referred for management of the malignant melanoma. He had a whole body PET scan on 01/31/2023 which revealed interval resolution of previously demonstrated hypermetabolic activity within the left foot, no evidence of local recurrence of metastatic disease, resolution of right middle lobe airspace diseases seen on most recent CT. No suspicious pulmonary nodularity, new opacification of the maxillary sinus with mild  peripheral hypermetabolic activity, likely inflammatory, and aortic atherosclerosis. He has a BRAF and PDL1 mutation.  Oncology History  Malignant melanoma, metastatic (HCC)  01/23/2022 Cancer Staging   Staging form: Melanoma of the Skin, AJCC 8th Edition - Clinical stage from 01/23/2022: Stage III (cT2b, cN1c, cM0) - Signed by [name], MD on 04/07/2022 Histopathologic type: Malignant melanoma, NOS (except juvenile melanoma M-8770/[id number]) Stage prefix: Initial diagnosis Laterality: Left Lymph-vascular invasion (LVI): LVI not present (absent)/not identified Diagnostic confirmation: Positive histology Specimen type: Biopsy / Limited Resection Staged by: Managing physician Mitotic count: 4 Mitotic unit: mm2 Clark's level: Level IV Tumor-infiltrating lymphocytes: Present and non-brisk Breslow depth (mm): 16 Ulceration of the epidermis: Yes Microsatellites: No Primary tumor regression: Absent Intransit/satellite metastasis: In-transit Lymph node clinically or radiologically detected: No Microscopic confirmation of metastasis: No Sentinel lymph node biopsy performed: No Completion or therapeutic lymph node dissection performed: No Matted nodes: No Stage used in treatment planning: Yes National guidelines used in treatment planning: Yes Type of national guideline used in treatment planning: NCCN   01/31/2022 Initial Diagnosis   Malignant melanoma, metastatic (  HCC)   04/14/2022 -  Chemotherapy   Patient is on Treatment Plan : MELANOMA Nivolumab (480) q28d (changed from q14d on 12/4)     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 [facility])  02/28/2022 Cancer Staging   Staging form: Melanoma of the Skin, AJCC 8th Edition - Clinical stage from 02/28/2022: Stage IV (cT2b(m), cN1c, cM1a) - Signed by [name], MD on 04/07/2022 Histopathologic type: Malignant melanoma, NOS (except juvenile melanoma M-8770/0) Stage prefix: Initial diagnosis Laterality: Left Multiple tumors:  Yes Lymph-vascular invasion (LVI): LVI present/identified, NOS Diagnostic confirmation: Positive histology Specimen type: Excision Staged by: Managing physician Intransit/satellite metastasis: Both in-transit and satellite Prognostic indicators: 02/28/22:  Wide excision of primary of foot - no residual melanoma,  Excision of skin of calf consistent with intransit mets of dermal/subcuticular   Melanoma with LVI, metastatic disease with multifocal lesions and multifocal positive margins 03/24/22: Re-excision with a few atypical melanocytes, no melanoma and clear margins   04/07/2022 Initial Diagnosis   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 Patient started Nivolumab and Ipilimumab on 04/14/2022. He has a BRAF and PDL1 mutation.  Patient states that he feels well and has no complaints of pain. He continues to have vitiligo like patches and pink skin (worsened from sun exposure) on his upper and lower extremities, this is likely caused from his medication. His day 1 cycle 14 of maintenance Nivolumab is scheduled on 03/05/2023. His labs today are pending. I will see him back in 4 weeks with CBC and CMP.  He denies signs of infection such as sore throat, sinus drainage, cough, or urinary symptoms.  He denies fevers or recurrent chills. He denies pain. He denies nausea, vomiting, chest pain, dyspnea or cough. His appetite is good and his weight has been stable.He is accompanied by his wife at today's visit.  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 , 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    Past Surgical History:  Procedure Laterality Date   LUMBAR LAMINECTOMY/DECOMPRESSION MICRODISCECTOMY N/A 01/26/2022   Procedure: Microlumbar decompression Lumbar four-five, lateral mass fusion with autograft and allograft bone;  Surgeon: Jene Every, MD;  Location: [facility] OR;  Service: Orthopedics;  Laterality: N/A;   PARTIAL GASTRECTOMY     TOTAL HIP ARTHROPLASTY Right 06/18/2018   Procedure: RIGHT TOTAL HIP ARTHROPLASTY ANTERIOR APPROACH;  Surgeon: Durene Romans, MD;  Location: [facility] ORS;  Service: Orthopedics;  Laterality: Right;    Family History  Problem Relation Age of Onset   Lung cancer Sister    Melanoma Brother     Social History:  reports that he has quit smoking. He has never used smokeless tobacco. He reports that he does not currently use alcohol. He reports that he does not use drugs.    Allergies:  Allergies  Allergen  Reactions   Morphine Itching    Patient states it is tolerable itching    Current Medications: Current Outpatient Medications  Medication Sig Dispense Refill   amLODipine (NORVASC) 5 MG tablet Take 5 mg by mouth daily.     clopidogrel (PLAVIX) 75 MG tablet Take 1 tablet by mouth daily.     docusate sodium (COLACE) 100 MG capsule 1 capsule 2 TIMES DAILY (route: oral)     famotidine (PEPCID) 40 MG tablet Take 1 tablet (40 mg total) by mouth daily. 30 tablet 5   folic acid (FOLVITE) 400 MCG tablet Take 400 mcg by mouth daily.     gabapentin (NEURONTIN) 300 MG capsule Take 1 capsule (300 mg total) by mouth 3 (three) times daily. 270 capsule 3   hydrochlorothiazide (HYDRODIURIL) 12.5 MG tablet Take 12.5 mg by mouth daily.     lisinopril (ZESTRIL) 20 MG tablet      Multiple Vitamin (MULTIVITAMIN WITH MINERALS) TABS tablet Take 1 tablet by mouth in the morning.     mupirocin ointment (BACTROBAN) 2 %  Apply topically.     Omega-3 1000 MG CAPS Take by mouth.     ondansetron (ZOFRAN) 4 MG tablet Take 1 tablet (4 mg total) by mouth every 4 (four) hours as needed for nausea. 90 tablet 3   oxyCODONE (OXY IR/ROXICODONE) 5 MG immediate release tablet Take 1 tablet (5 mg total) by mouth every 6 (six) hours as needed for severe pain. 30 tablet 0   polyethylene glycol (MIRALAX / GLYCOLAX) 17 g packet Take 17 g by mouth daily. 14 each 0   pravastatin (PRAVACHOL) 40 MG tablet Take 40 mg by mouth daily.     prochlorperazine (COMPAZINE) 10 MG tablet Take 1 tablet (10 mg total) by mouth every 6 (six) hours as needed for nausea or vomiting. 90 tablet 3   traZODone (DESYREL) 50 MG tablet Take 1 tablet (50 mg total) by mouth at bedtime as needed for sleep. 30 tablet 5   No current facility-administered medications for this visit.    REVIEW OF SYSTEMS:  Review of Systems  Constitutional: Negative.  Negative for appetite change, chills, diaphoresis, fatigue, fever and unexpected weight change.  HENT:  Negative.  Negative for hearing loss, lump/mass, mouth sores, nosebleeds, sore throat, tinnitus, trouble swallowing and voice change.   Eyes: Negative.  Negative for eye problems and icterus.  Respiratory: Negative.  Negative for chest tightness, cough, hemoptysis, shortness of breath and wheezing.   Cardiovascular:  Positive for leg swelling (LLE and leg pain). Negative for chest pain and palpitations.  Gastrointestinal: Negative.  Negative for abdominal distention, abdominal pain, blood in stool, constipation, diarrhea, nausea, rectal pain and vomiting.  Endocrine: Negative.  Negative for hot flashes.  Genitourinary: Negative.  Negative for bladder incontinence, difficulty urinating, dyspareunia, dysuria, frequency, hematuria, nocturia, pelvic pain and penile discharge.   Musculoskeletal:  Positive for back pain (Chronic, improved). Negative for arthralgias, flank pain, gait problem, myalgias, neck pain and neck  stiffness.  Skin: Negative.  Negative for itching, rash and wound.       Well healed scar below the medial knee. Vitiligo patches on his upper and lower extremities.   Neurological: Negative.  Negative for dizziness, extremity weakness, gait problem, headaches, light-headedness, numbness, seizures and speech difficulty.  Hematological: Negative.  Negative for adenopathy. Does not bruise/bleed easily.  Psychiatric/Behavioral: Negative.  Negative for confusion, decreased concentration, depression, sleep disturbance and suicidal ideas. The patient is not nervous/anxious.      VITALS:  Blood pressure 127/70, pulse 67, temperature 98.4 F (36.9 C), temperature source Oral, resp. rate 18, height 5' 9.39" (1.763 m), weight 236 lb 3.2 oz (107.1 kg), SpO2 97%.  Wt Readings from Last 3 Encounters:  03/05/23 233 lb (105.7 kg)  03/01/23 236 lb 3.2 oz (107.1 kg)  02/05/23 239 lb (108.4 kg)    Body mass index is 34.49 kg/m.  Performance status (ECOG): 1 - Symptomatic but completely ambulatory  PHYSICAL EXAM:  Physical Exam Vitals and nursing note reviewed. Exam conducted with a chaperone present.  Constitutional:      General: He is not in acute distress.    Appearance: Normal appearance. He is normal weight. He is not ill-appearing, toxic-appearing or diaphoretic.  HENT:     Head: Normocephalic and atraumatic.     Right Ear: Tympanic membrane, ear canal and external ear normal. There is no impacted cerumen.     Left Ear: Tympanic membrane, ear canal and external ear normal. There is no impacted cerumen.     Nose: Nose normal. No congestion or rhinorrhea.     Mouth/Throat:     Mouth: Mucous membranes are moist.     Pharynx: Oropharynx is clear. No oropharyngeal exudate or posterior oropharyngeal erythema.  Eyes:     General: No scleral icterus.       Right eye: No discharge.        Left eye: No discharge.     Extraocular Movements: Extraocular movements intact.     Conjunctiva/sclera:  Conjunctivae normal.     Pupils: Pupils are equal, round, and reactive to light.  Neck:     Vascular: No carotid bruit.  Cardiovascular:     Rate and Rhythm: Normal rate and regular rhythm.     Pulses: Normal pulses.     Heart sounds: Normal heart sounds. No murmur heard.    No friction rub. No gallop.  Pulmonary:     Effort: Pulmonary effort is normal. No respiratory distress.     Breath sounds: Normal breath sounds. No stridor. No wheezing, rhonchi or rales.  Chest:     Chest wall: No tenderness.  Abdominal:     General: Bowel sounds are normal. There is no distension.     Palpations: Abdomen is soft. There is no hepatomegaly, splenomegaly or mass.     Tenderness: There is no abdominal tenderness. There is no right CVA tenderness, left CVA tenderness, guarding or rebound.     Hernia: No hernia is present.     Comments: Mid line abdominal scar that is well healed.   Musculoskeletal:        General: No swelling, tenderness, deformity or signs of injury. Normal range of motion.     Cervical back: Normal range of motion and neck supple. No rigidity or tenderness.     Right lower leg: No edema.     Left lower leg: 1+ Edema present.     Comments: Wound of the medial left knee is well healed with extensive scar tissue (unchanged)  Feet:     Comments: 2+ edema Healing scar on the dorsum of the left foot.   Lymphadenopathy:     Cervical: No cervical adenopathy.     Upper Body:     Right upper body: No supraclavicular or axillary adenopathy.     Left upper body: No supraclavicular or axillary adenopathy.     Lower Body: No right inguinal adenopathy. No left inguinal adenopathy.  Skin:    General: Skin is warm and dry.  Coloration: Skin is not jaundiced or pale.     Findings: No bruising, erythema, lesion or rash.  Neurological:     General: No focal deficit present.     Mental Status: He is alert and oriented to person, place, and time. Mental status is at baseline.     Cranial  Nerves: No cranial nerve deficit.     Sensory: No sensory deficit.     Motor: No weakness.     Coordination: Coordination normal.     Gait: Gait normal.     Deep Tendon Reflexes: Reflexes normal.  Psychiatric:        Mood and Affect: Mood normal.        Behavior: Behavior normal.        Thought Content: Thought content normal.        Judgment: Judgment normal.     LABS:      Latest Ref Rng & Units 03/01/2023    1:57 PM 02/01/2023    2:23 PM 01/04/2023    1:23 PM  CBC  WBC 4.0 - 10.5 K/uL 13.1  12.8  17.1   Hemoglobin 13.0 - 17.0 g/dL 04.5  40.9  81.1   Hematocrit 39.0 - 52.0 % 39.4  41.2  45.6   Platelets 150 - 400 K/uL 247  247  360       Latest Ref Rng & Units 03/01/2023    1:57 PM 02/01/2023    2:23 PM 01/04/2023    1:23 PM  CMP  Glucose 70 - 99 mg/dL 914  93  782   BUN 8 - 23 mg/dL 18  23  27    Creatinine 0.61 - 1.24 mg/dL 9.56  2.13  0.86   Sodium 135 - 145 mmol/L 135  137  137   Potassium 3.5 - 5.1 mmol/L 3.8  3.9  4.2   Chloride 98 - 111 mmol/L 107  106  105   CO2 22 - 32 mmol/L 22  22  22    Calcium 8.9 - 10.3 mg/dL 8.5  8.8  8.8   Total Protein 6.5 - 8.1 g/dL 7.2  7.1  7.9   Total Bilirubin 0.3 - 1.2 mg/dL 0.9  0.6  0.6   Alkaline Phos 38 - 126 U/L 78  65  66   AST 15 - 41 U/L 18  22  26    ALT 0 - 44 U/L 16  20  22     Component Ref Range & Units 02/01/2023 01/04/2023 1 mo ago 2 mo ago 3 mo ago 6 mo ago 8 mo ago 9 mo ago  TSH 0.350 - 4.500 uIU/mL 0.971 1.052 1.468 CM 1.490 CM 1.551 CM 1.223 CM 1.060 CM 1.166 CM     Component Ref Range & Units 02/01/2023 01/04/2023 1 mo ago 2 mo ago 3 mo ago 6 mo ago 8 mo ago 9 mo ago  T4, Total 4.5 - 12.0 ug/dL 7.2 8.8 8.3 CM 9.3 CM 8.1 CM 7.4 CM 7.5 CM 8.0 CM      No results found for: "CEA1", "CEA" / No results found for: "CEA1", "CEA" No results found for: "PSA1" No results found for: "VHQ469" No results found for: "CAN125"  No results found for: "TOTALPROTELP", "ALBUMINELP", "A1GS", "A2GS", "BETS", "BETA2SER",  "GAMS", "MSPIKE", "SPEI" No results found for: "TIBC", "FERRITIN", "IRONPCTSAT" Lab Results  Component Value Date   LDH 140 02/01/2023   LDH 160 02/07/2022    STUDIES:  Exam: 01/31/23 PET WHOLE BODY IMPRESSION: Interval resolution of previously  demonstrated hypermetabolic activity within the left foot. No evidence of local recurrence of metastatic disease. Resolution of right middle lobe airspace diseases seen on most recent CT. No suspicious pulmonary nodularity. New opacification of the maxillary sinus with mild peripheral hypermetabolic activity, likely inflammatory Aortic Atherosclerosis (ICD10-170.0)  Exam: 08/16/22 CT Chest, Abdomen, and Pelvis with contrast Chest Impressions: The 2 dominant nodules in the RIGHT lung previously documented are not readily identified; however, there is a new branching nodularity/consolidation in RIGHT middle lobe. RIGHT middle lobe findings favor infectious or inflammatory process. No metastatic mediastinal adenopathy identified No cutaneous lesion.  Abdomen / Pelvis Impression: No evidence of metastatic disease in the abdomen pelvis. Benign Bosniak 1 renal cysts. No follow-up recommended. No lymphadenopathy or peritoneal metastasis.     I,Jasmine M Lassiter,acting as a scribe for [name], MD.,have documented all relevant documentation on the behalf of [name], MD,as directed by  [name], MD while in the presence of [name], MD.  I have reviewed this report as typed by the medical scribe, and it is complete and accurate.  [name]   03/15/23 9:04 AM

## 2023-03-01 ENCOUNTER — Encounter: Payer: Self-pay | Admitting: Oncology

## 2023-03-01 ENCOUNTER — Inpatient Hospital Stay (INDEPENDENT_AMBULATORY_CARE_PROVIDER_SITE_OTHER): Payer: Medicare HMO | Admitting: Oncology

## 2023-03-01 ENCOUNTER — Inpatient Hospital Stay: Payer: Medicare HMO | Attending: Oncology

## 2023-03-01 VITALS — BP 127/70 | HR 67 | Temp 98.4°F | Resp 18 | Ht 69.39 in | Wt 236.2 lb

## 2023-03-01 DIAGNOSIS — C439 Malignant melanoma of skin, unspecified: Secondary | ICD-10-CM

## 2023-03-01 DIAGNOSIS — C792 Secondary malignant neoplasm of skin: Secondary | ICD-10-CM | POA: Diagnosis not present

## 2023-03-01 DIAGNOSIS — R918 Other nonspecific abnormal finding of lung field: Secondary | ICD-10-CM

## 2023-03-01 DIAGNOSIS — C4372 Malignant melanoma of left lower limb, including hip: Secondary | ICD-10-CM | POA: Insufficient documentation

## 2023-03-01 DIAGNOSIS — Z5112 Encounter for antineoplastic immunotherapy: Secondary | ICD-10-CM | POA: Insufficient documentation

## 2023-03-01 DIAGNOSIS — Z79899 Other long term (current) drug therapy: Secondary | ICD-10-CM | POA: Diagnosis not present

## 2023-03-01 LAB — CMP (CANCER CENTER ONLY)
ALT: 16 U/L (ref 0–44)
AST: 18 U/L (ref 15–41)
Albumin: 4.1 g/dL (ref 3.5–5.0)
Alkaline Phosphatase: 78 U/L (ref 38–126)
Anion gap: 6 (ref 5–15)
BUN: 18 mg/dL (ref 8–23)
CO2: 22 mmol/L (ref 22–32)
Calcium: 8.5 mg/dL — ABNORMAL LOW (ref 8.9–10.3)
Chloride: 107 mmol/L (ref 98–111)
Creatinine: 1.1 mg/dL (ref 0.61–1.24)
GFR, Estimated: >60 mL/min (ref >60)
Glucose, Bld: 100 mg/dL — ABNORMAL HIGH (ref 70–99)
Potassium: 3.8 mmol/L (ref 3.5–5.1)
Sodium: 135 mmol/L (ref 135–145)
Total Bilirubin: 0.9 mg/dL (ref 0.3–1.2)
Total Protein: 7.2 g/dL (ref 6.5–8.1)

## 2023-03-01 LAB — CBC WITH DIFFERENTIAL (CANCER CENTER ONLY)
Abs Immature Granulocytes: 0.04 10*3/uL (ref 0.00–0.07)
Basophils Absolute: 0 10*3/uL (ref 0.0–0.1)
Basophils Relative: 0 %
Eosinophils Absolute: 0.7 10*3/uL — ABNORMAL HIGH (ref 0.0–0.5)
Eosinophils Relative: 6 %
HCT: 39.4 % (ref 39.0–52.0)
Hemoglobin: 13.1 g/dL (ref 13.0–17.0)
Immature Granulocytes: 0 %
Lymphocytes Relative: 23 %
Lymphs Abs: 3 10*3/uL (ref 0.7–4.0)
MCH: 29.2 pg (ref 26.0–34.0)
MCHC: 33.2 g/dL (ref 30.0–36.0)
MCV: 87.9 fL (ref 80.0–100.0)
Monocytes Absolute: 1.1 10*3/uL — ABNORMAL HIGH (ref 0.1–1.0)
Monocytes Relative: 8 %
Neutro Abs: 8.2 10*3/uL — ABNORMAL HIGH (ref 1.7–7.7)
Neutrophils Relative %: 63 %
Platelet Count: 247 10*3/uL (ref 150–400)
RBC: 4.48 MIL/uL (ref 4.22–5.81)
RDW: 13.2 % (ref 11.5–15.5)
WBC Count: 13.1 10*3/uL — ABNORMAL HIGH (ref 4.0–10.5)
nRBC: 0 % (ref 0.0–0.2)

## 2023-03-01 LAB — TSH: TSH: 1.157 u[IU]/mL (ref 0.350–4.500)

## 2023-03-02 LAB — T4: T4, Total: 7 ug/dL (ref 4.5–12.0)

## 2023-03-02 MED FILL — Nivolumab IV Soln 240 MG/24ML: INTRAVENOUS | Qty: 48 | Status: AC

## 2023-03-03 ENCOUNTER — Other Ambulatory Visit: Payer: Self-pay

## 2023-03-05 ENCOUNTER — Inpatient Hospital Stay: Payer: Medicare HMO

## 2023-03-05 VITALS — BP 149/67 | HR 65 | Temp 97.6°F | Resp 18 | Ht 69.39 in | Wt 233.0 lb

## 2023-03-05 DIAGNOSIS — C439 Malignant melanoma of skin, unspecified: Secondary | ICD-10-CM

## 2023-03-05 DIAGNOSIS — Z79899 Other long term (current) drug therapy: Secondary | ICD-10-CM | POA: Diagnosis not present

## 2023-03-05 DIAGNOSIS — Z5112 Encounter for antineoplastic immunotherapy: Secondary | ICD-10-CM | POA: Diagnosis not present

## 2023-03-05 DIAGNOSIS — C4372 Malignant melanoma of left lower limb, including hip: Secondary | ICD-10-CM | POA: Diagnosis not present

## 2023-03-05 DIAGNOSIS — C792 Secondary malignant neoplasm of skin: Secondary | ICD-10-CM

## 2023-03-05 MED ORDER — SODIUM CHLORIDE 0.9 % IV SOLN
480.0000 mg | Freq: Once | INTRAVENOUS | Status: AC
  Administered 2023-03-05: 480 mg via INTRAVENOUS
  Filled 2023-03-05: qty 48

## 2023-03-05 MED ORDER — HEPARIN SOD (PORK) LOCK FLUSH 100 UNIT/ML IV SOLN
500.0000 [IU] | Freq: Once | INTRAVENOUS | Status: AC | PRN
  Administered 2023-03-05: 500 [IU]

## 2023-03-05 MED ORDER — SODIUM CHLORIDE 0.9% FLUSH
10.0000 mL | INTRAVENOUS | Status: DC | PRN
  Administered 2023-03-05: 10 mL

## 2023-03-05 MED ORDER — SODIUM CHLORIDE 0.9 % IV SOLN: Freq: Once | INTRAVENOUS | Status: AC

## 2023-03-05 NOTE — Patient Instructions (Signed)
[facility] [place]  Discharge Instructions: Thank you for choosing [facility] to provide your oncology and hematology care.  If you have a lab appointment with the Cancer Center, please go directly to the Cancer Center and check in at the registration area.   Wear comfortable clothing and clothing appropriate for easy access to any Portacath or PICC line.   We strive to give you quality time with your provider. You may need to reschedule your appointment if you arrive late (15 or more minutes).  Arriving late affects you and other patients whose appointments are after yours.  Also, if you miss three or more appointments without notifying the office, you may be dismissed from the clinic at the provider's discretion.      For prescription refill requests, have your pharmacy contact our office and allow 72 hours for refills to be completed.    Today you received the following chemotherapy and/or immunotherapy agents Opdivo      To help prevent nausea and vomiting after your treatment, we encourage you to take your nausea medication as directed.  BELOW ARE SYMPTOMS THAT SHOULD BE REPORTED IMMEDIATELY: *FEVER GREATER THAN 100.4 F (38 C) OR HIGHER *CHILLS OR SWEATING *NAUSEA AND VOMITING THAT IS NOT CONTROLLED WITH YOUR NAUSEA MEDICATION *UNUSUAL SHORTNESS OF BREATH *UNUSUAL BRUISING OR BLEEDING *URINARY PROBLEMS (pain or burning when urinating, or frequent urination) *BOWEL PROBLEMS (unusual diarrhea, constipation, pain near the anus) TENDERNESS IN MOUTH AND THROAT WITH OR WITHOUT PRESENCE OF ULCERS (sore throat, sores in mouth, or a toothache) UNUSUAL RASH, SWELLING OR PAIN  UNUSUAL VAGINAL DISCHARGE OR ITCHING   Items with * indicate a potential emergency and should be followed up as soon as possible or go to the Emergency Department if any problems should occur.  Please show the CHEMOTHERAPY ALERT CARD or IMMUNOTHERAPY ALERT CARD at check-in to the  Emergency Department and triage nurse.  Should you have questions after your visit or need to cancel or reschedule your appointment, please contact [facility] [place]  Dept: [phone]  and follow the prompts.  Office hours are 8:00 a.m. to 4:30 p.m. Monday - Friday. Please note that voicemails left after 4:00 p.m. may not be returned until the following business day.  We are closed weekends and major holidays. You have access to a nurse at all times for urgent questions. Please call the main number to the clinic Dept: [phone] and follow the prompts.  For any non-urgent questions, you may also contact your provider using MyChart. We now offer e-Visits for anyone 18 and older to request care online for non-urgent symptoms. For details visit [email or web address].   Also download the MyChart app! Go to the app store, search "MyChart", open the app, select [facility], and log in with your MyChart username and password.   

## 2023-03-06 DIAGNOSIS — Z9181 History of falling: Secondary | ICD-10-CM | POA: Diagnosis not present

## 2023-03-06 DIAGNOSIS — Z8673 Personal history of transient ischemic attack (TIA), and cerebral infarction without residual deficits: Secondary | ICD-10-CM | POA: Diagnosis not present

## 2023-03-06 DIAGNOSIS — Z6832 Body mass index (BMI) 32.0-32.9, adult: Secondary | ICD-10-CM | POA: Diagnosis not present

## 2023-03-06 DIAGNOSIS — H9201 Otalgia, right ear: Secondary | ICD-10-CM | POA: Diagnosis not present

## 2023-03-06 DIAGNOSIS — I10 Essential (primary) hypertension: Secondary | ICD-10-CM | POA: Diagnosis not present

## 2023-03-06 DIAGNOSIS — Z1339 Encounter for screening examination for other mental health and behavioral disorders: Secondary | ICD-10-CM | POA: Diagnosis not present

## 2023-03-06 DIAGNOSIS — Z0000 Encounter for general adult medical examination without abnormal findings: Secondary | ICD-10-CM | POA: Diagnosis not present

## 2023-03-06 DIAGNOSIS — E7800 Pure hypercholesterolemia, unspecified: Secondary | ICD-10-CM | POA: Diagnosis not present

## 2023-03-06 DIAGNOSIS — C439 Malignant melanoma of skin, unspecified: Secondary | ICD-10-CM | POA: Diagnosis not present

## 2023-03-07 ENCOUNTER — Other Ambulatory Visit: Payer: Self-pay | Admitting: Oncology

## 2023-03-07 DIAGNOSIS — M48061 Spinal stenosis, lumbar region without neurogenic claudication: Secondary | ICD-10-CM

## 2023-03-07 MED ORDER — GABAPENTIN 300 MG PO CAPS
300.0000 mg | ORAL_CAPSULE | Freq: Three times a day (TID) | ORAL | 2 refills | Status: DC
Start: 2023-03-07 — End: 2023-03-07

## 2023-03-08 ENCOUNTER — Telehealth: Payer: Self-pay

## 2023-03-08 ENCOUNTER — Other Ambulatory Visit: Payer: Self-pay | Admitting: Oncology

## 2023-03-08 DIAGNOSIS — M48061 Spinal stenosis, lumbar region without neurogenic claudication: Secondary | ICD-10-CM

## 2023-03-08 MED ORDER — GABAPENTIN 300 MG PO CAPS: 300.0000 mg | ORAL_CAPSULE | Freq: Three times a day (TID) | ORAL | 3 refills | Status: DC

## 2023-03-08 NOTE — Telephone Encounter (Signed)
-----   Message from [name] sent at 03/08/2023 10:47 AM EDT ----- Regarding: call Tell him labs all look good but calcium low. I recommend he take 1 daily, 600 mg

## 2023-03-08 NOTE — Telephone Encounter (Signed)
Gave patient recommendations

## 2023-03-15 ENCOUNTER — Encounter: Payer: Self-pay | Admitting: Oncology

## 2023-03-28 NOTE — Progress Notes (Incomplete)
[facility]  [address] [place],  [place]  [address] [phone]  Clinic Day: 03/29/23   Referring physician: [name], MD  ASSESSMENT & PLAN:  Assessment: Malignant melanoma of the left foot He had a punch biopsy and this reveals a Breslow thickness of 1.6 mm and Clark's level 4 with ulceration for a T2b N0 M0 clinically.  We found no distant metastasis on PET scan, but he did have metastasis to the left lower extremity.  His tumor is positive for BRAF and PDL1 mutation. He had this treated with wide local excision.   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   Multiple lesions of the lung These were suspicious for metastatic disease but have now resolved on the CT scan on 08/12/22.  The PET scan was negative but we cannot absolutely rule out metastatic disease since these lesions are small. We discussed the fact that the resolution of these lesions could represent a response to treatment, or may not have been malignant at all. I think it is more likely the former, and so I recommend continuation of therapy.  We have had several discussions about this.  Spinal stenosis at L4-5 He has had surgical decompression and resection of a synovial cyst as of January 26 2022.  Leukocytosis He has had chronic leukocytosis and no symptoms of infection and urinalysis and urine culture were negative.  We did not find an explanation but the [facility] continue to fluctuate up and down.  Lymphedema of the Left Lower Extremity We discussed this diagnosis and the limited approaches available. For now we will just monitor it.   Plan: He had a whole body PET scan on 01/31/2023 which revealed interval resolution of previously demonstrated hypermetabolic activity within the left foot,  no evidence of local recurrence of metastatic disease, resolution of right middle lobe airspace diseases seen on most recent CT. No suspicious pulmonary nodularity, new opacification of the maxillary sinus with mild peripheral hypermetabolic activity, likely inflammatory, and aortic atherosclerosis. He continues to have vitiligo like patches and pink skin (worsened from sun exposure) on his upper and lower extremities, this is likely caused from his medication. His day 1 cycle 14 of Nivolumab is scheduled on 03/05/2023. His labs today are pending. I will see him back in 4 weeks with CBC and CMP. The patient was provided an opportunity to ask questions and all were answered.  The patient agreed with the plan and demonstrated an understanding of the instructions.  The patient was advised to call back if the symptoms worsen or if the condition fails to improve as anticipated.   I provided 19  minutes of face-to-face time during this this encounter and > 50% was spent counseling as documented under my assessment and plan.   [name], MD [facility] AT [facility] [address] [place] [address] Dept: [phone] Dept Fax: [phone]   CHIEF COMPLAINT:  CC: Malignant melanoma  Current Treatment: Evaluation and immunotherapy   HISTORY OF PRESENT ILLNESS:  [name] is a 77 y.o. male with a history of malignant melanoma who is referred in consultation with Dr. Luna Kitchens for assessment and management.  He had a lesion of the dorsum of the left foot in September 2022 which appeared  to be a granuloma and was treated in the office.  He later developed a another lesion lateral to this on the dorsum of the left foot which was not hyperpigmented but did bleed easily.  He also has several lesions of his left medial calf which are nodular and span approximately 6 cm.  He has had some increased edema of the left lower leg.  He was evaluated by  dermatology on June 5, who felt this could be a squamous cell carcinoma versus tinea or a drug reaction.  He had biopsies done of both the foot lesion and the Lesion.  Pathology came back that the left dorsal foot shave biopsy revealed a malignant melanoma possibly a primary nodular ulcerated melanoma which had a Breslow thickness of at least 1.6 mm and a Clark's level 4.  Margins are positive as this was a shave biopsy.  Ulceration is present but no satellitosis.  The mitotic index is 4/mm and no lymphovascular invasion was identified.  No brisk tumor infiltrating lymphocytes are seen and tumor regression is absent.  This is felt to be at least a T2b lesion and wide excision was recommended.  The lesion of the left medial calf revealed a dermal map melanoma most consistent with metastatic disease.  This information was not available when the patient was admitted for back surgery on June 8 and he had a microlumbar decompression at L4-5 for spinal stenosis.  He was also found to have a synovial cyst at that time which was resected and benign.  He was readmitted several days later for acute pain of the right elbow and both arms.  Aspiration was obtained and this was diagnosed as gouty arthritis.  He did have temporary rehab after his back surgery.  His left leg was evaluated for deep venous thrombosis and was negative.  While he was recuperating from his surgery he had an x-ray which was abnormal which led to a CT scan which revealed at least 3 lung lesions.  MRI of the brain was negative and he is now referred for management of the malignant melanoma. He had a whole body PET scan on 01/31/2023 which revealed interval resolution of previously demonstrated hypermetabolic activity within the left foot, no evidence of local recurrence of metastatic disease, resolution of right middle lobe airspace diseases seen on most recent CT. No suspicious pulmonary nodularity, new opacification of the maxillary sinus with mild  peripheral hypermetabolic activity, likely inflammatory, and aortic atherosclerosis. He has a BRAF and PDL1 mutation.  Oncology History  Malignant melanoma, metastatic (HCC)  01/23/2022 Cancer Staging   Staging form: Melanoma of the Skin, AJCC 8th Edition - Clinical stage from 01/23/2022: Stage III (cT2b, cN1c, cM0) - Signed by [name], MD on 04/07/2022 Histopathologic type: Malignant melanoma, NOS (except juvenile melanoma M-8770/[id number]) Stage prefix: Initial diagnosis Laterality: Left Lymph-vascular invasion (LVI): LVI not present (absent)/not identified Diagnostic confirmation: Positive histology Specimen type: Biopsy / Limited Resection Staged by: Managing physician Mitotic count: 4 Mitotic unit: mm2 Clark's level: Level IV Tumor-infiltrating lymphocytes: Present and non-brisk Breslow depth (mm): 16 Ulceration of the epidermis: Yes Microsatellites: No Primary tumor regression: Absent Intransit/satellite metastasis: In-transit Lymph node clinically or radiologically detected: No Microscopic confirmation of metastasis: No Sentinel lymph node biopsy performed: No Completion or therapeutic lymph node dissection performed: No Matted nodes: No Stage used in treatment planning: Yes National guidelines used in treatment planning: Yes Type of national guideline used in treatment planning: NCCN   01/31/2022 Initial Diagnosis   Malignant  melanoma, metastatic (HCC)   04/14/2022 -  Chemotherapy   Patient is on Treatment Plan : MELANOMA Nivolumab (480) q28d (changed from q14d on 12/4)     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 [facility])  02/28/2022 Cancer Staging   Staging form: Melanoma of the Skin, AJCC 8th Edition - Clinical stage from 02/28/2022: Stage IV (cT2b(m), cN1c, cM1a) - Signed by [name], MD on 04/07/2022 Histopathologic type: Malignant melanoma, NOS (except juvenile melanoma M-8770/0) Stage prefix: Initial diagnosis Laterality: Left Multiple tumors:  Yes Lymph-vascular invasion (LVI): LVI present/identified, NOS Diagnostic confirmation: Positive histology Specimen type: Excision Staged by: Managing physician Intransit/satellite metastasis: Both in-transit and satellite Prognostic indicators: 02/28/22:  Wide excision of primary of foot - no residual melanoma,  Excision of skin of calf consistent with intransit mets of dermal/subcuticular   Melanoma with LVI, metastatic disease with multifocal lesions and multifocal positive margins 03/24/22: Re-excision with a few atypical melanocytes, no melanoma and clear margins   04/07/2022 Initial Diagnosis   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 Patient started Nivolumab and Ipilimumab on 04/14/2022. He has a BRAF and PDL1 mutation.  Patient states that he feels *** and ***    I will see him  back in *** with CBC and CMP.  He  denies signs of infections such as sore throat, sinus drainage, cough or urinary symptoms. He  denies fever or recurrent chills. He  also deny nausea, vomiting, chest pain, or dyspnea. His  appetite is *** and His  weight {Weight change:10426}    well and has no complaints of pain. He continues to have vitiligo like patches and pink skin (worsened from sun exposure) on his upper and lower extremities, this is likely caused from his medication. His day 1 cycle 14 of maintenance Nivolumab is scheduled on 03/05/2023. His labs today are pending. I will see him back in 4 weeks with CBC and CMP.  He denies signs of infection such as sore throat, sinus drainage, cough, or urinary symptoms.  He denies fevers or recurrent chills. He denies pain. He denies nausea, vomiting, chest pain, dyspnea or cough. His appetite is good and his weight has been stable.He is accompanied by  his wife at today's visit.  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 , 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    Past Surgical History:  Procedure Laterality Date   LUMBAR LAMINECTOMY/DECOMPRESSION MICRODISCECTOMY N/A 01/26/2022   Procedure: Microlumbar decompression Lumbar four-five, lateral mass fusion with autograft and allograft bone;  Surgeon: Jene Every, MD;  Location: [facility] OR;  Service: Orthopedics;  Laterality: N/A;   PARTIAL GASTRECTOMY     TOTAL HIP ARTHROPLASTY Right 06/18/2018   Procedure: RIGHT TOTAL HIP ARTHROPLASTY ANTERIOR APPROACH;  Surgeon: Durene Romans, MD;  Location: [facility] ORS;  Service: Orthopedics;  Laterality: Right;    Family History  Problem Relation Age of Onset   Lung cancer Sister    Melanoma Brother     Social History:  reports that he has quit smoking. He has never used smokeless tobacco. He reports that he does not currently use alcohol. He reports that he does not use drugs.    Allergies:  Allergies  Allergen Reactions   Morphine Itching    Patient states it is tolerable itching    Current Medications: Current Outpatient Medications  Medication Sig Dispense Refill   amLODipine (NORVASC) 5 MG tablet Take 5 mg by mouth daily.     clopidogrel (PLAVIX) 75 MG tablet Take 1 tablet by mouth daily.     docusate sodium (COLACE) 100 MG capsule 1 capsule 2 TIMES DAILY (route: oral)     famotidine (PEPCID) 40 MG tablet Take 1 tablet (40 mg total) by mouth daily. 30 tablet 5   folic acid (FOLVITE) 400 MCG tablet Take 400 mcg by mouth daily.     gabapentin (NEURONTIN) 300 MG capsule Take 1 capsule (300 mg  total) by mouth 3 (three) times daily. 270 capsule 3   hydrochlorothiazide (HYDRODIURIL) 12.5 MG tablet Take 12.5 mg by mouth daily.     lisinopril (ZESTRIL) 20 MG tablet      Multiple Vitamin (MULTIVITAMIN WITH MINERALS) TABS tablet Take 1 tablet by mouth in the morning.     mupirocin ointment (BACTROBAN) 2 % Apply topically.     Omega-3 1000 MG CAPS Take by mouth.     ondansetron (ZOFRAN) 4 MG tablet Take 1 tablet (4 mg total) by mouth every 4 (four) hours as needed for nausea. 90 tablet 3   oxyCODONE (OXY IR/ROXICODONE) 5 MG immediate release tablet Take 1 tablet (5 mg total) by mouth every 6 (six) hours as needed for severe pain. 30 tablet 0   polyethylene glycol (MIRALAX / GLYCOLAX) 17 g packet Take 17 g by mouth daily. 14 each 0   pravastatin (PRAVACHOL) 40 MG tablet Take 40 mg by mouth daily.     prochlorperazine (COMPAZINE) 10 MG tablet Take 1 tablet (10 mg total) by mouth every 6 (six) hours as needed for nausea or vomiting. 90 tablet 3   traZODone (DESYREL) 50 MG tablet Take 1 tablet (50 mg total) by mouth at bedtime as needed for sleep. 30 tablet 5   No current facility-administered medications for this visit.    REVIEW OF SYSTEMS:  Review of Systems  Constitutional: Negative.  Negative for appetite change, chills, diaphoresis, fatigue, fever and unexpected weight change.  HENT:  Negative.  Negative for hearing loss, lump/mass, mouth sores, nosebleeds, sore throat, tinnitus, trouble swallowing and voice change.   Eyes: Negative.  Negative for eye problems and icterus.  Respiratory: Negative.  Negative for chest tightness, cough, hemoptysis, shortness of breath and wheezing.   Cardiovascular:  Positive for leg swelling (LLE and leg pain). Negative for chest pain and palpitations.  Gastrointestinal: Negative.  Negative for abdominal distention, abdominal pain, blood in stool, constipation, diarrhea, nausea, rectal pain and vomiting.  Endocrine: Negative.  Negative for hot flashes.   Genitourinary: Negative.  Negative for bladder incontinence, difficulty urinating, dyspareunia, dysuria, frequency, hematuria, nocturia, pelvic pain and penile discharge.   Musculoskeletal:  Positive for back pain (Chronic, improved). Negative for arthralgias, flank pain, gait problem, myalgias, neck pain and neck stiffness.  Skin: Negative.  Negative for itching, rash and wound.       Well healed scar below the  medial knee. Vitiligo patches on his upper and lower extremities.   Neurological: Negative.  Negative for dizziness, extremity weakness, gait problem, headaches, light-headedness, numbness, seizures and speech difficulty.  Hematological: Negative.  Negative for adenopathy. Does not bruise/bleed easily.  Psychiatric/Behavioral: Negative.  Negative for confusion, decreased concentration, depression, sleep disturbance and suicidal ideas. The patient is not nervous/anxious.      VITALS:  There were no vitals taken for this visit.  Wt Readings from Last 3 Encounters:  03/05/23 233 lb (105.7 kg)  03/01/23 236 lb 3.2 oz (107.1 kg)  02/05/23 239 lb (108.4 kg)    There is no height or weight on file to calculate BMI.  Performance status (ECOG): 1 - Symptomatic but completely ambulatory  PHYSICAL EXAM:  Physical Exam Vitals and nursing note reviewed. Exam conducted with a chaperone present.  Constitutional:      General: He is not in acute distress.    Appearance: Normal appearance. He is normal weight. He is not ill-appearing, toxic-appearing or diaphoretic.  HENT:     Head: Normocephalic and atraumatic.     Right Ear: Tympanic membrane, ear canal and external ear normal. There is no impacted cerumen.     Left Ear: Tympanic membrane, ear canal and external ear normal. There is no impacted cerumen.     Nose: Nose normal. No congestion or rhinorrhea.     Mouth/Throat:     Mouth: Mucous membranes are moist.     Pharynx: Oropharynx is clear. No oropharyngeal exudate or posterior  oropharyngeal erythema.  Eyes:     General: No scleral icterus.       Right eye: No discharge.        Left eye: No discharge.     Extraocular Movements: Extraocular movements intact.     Conjunctiva/sclera: Conjunctivae normal.     Pupils: Pupils are equal, round, and reactive to light.  Neck:     Vascular: No carotid bruit.  Cardiovascular:     Rate and Rhythm: Normal rate and regular rhythm.     Pulses: Normal pulses.     Heart sounds: Normal heart sounds. No murmur heard.    No friction rub. No gallop.  Pulmonary:     Effort: Pulmonary effort is normal. No respiratory distress.     Breath sounds: Normal breath sounds. No stridor. No wheezing, rhonchi or rales.  Chest:     Chest wall: No tenderness.  Abdominal:     General: Bowel sounds are normal. There is no distension.     Palpations: Abdomen is soft. There is no hepatomegaly, splenomegaly or mass.     Tenderness: There is no abdominal tenderness. There is no right CVA tenderness, left CVA tenderness, guarding or rebound.     Hernia: No hernia is present.     Comments: Mid line abdominal scar that is well healed.   Musculoskeletal:        General: No swelling, tenderness, deformity or signs of injury. Normal range of motion.     Cervical back: Normal range of motion and neck supple. No rigidity or tenderness.     Right lower leg: No edema.     Left lower leg: 1+ Edema present.     Comments: Wound of the medial left knee is well healed with extensive scar tissue (unchanged)  Feet:     Comments: 2+ edema Healing scar on the dorsum of the left foot.   Lymphadenopathy:     Cervical: No cervical adenopathy.     Upper  Body:     Right upper body: No supraclavicular or axillary adenopathy.     Left upper body: No supraclavicular or axillary adenopathy.     Lower Body: No right inguinal adenopathy. No left inguinal adenopathy.  Skin:    General: Skin is warm and dry.     Coloration: Skin is not jaundiced or pale.     Findings:  No bruising, erythema, lesion or rash.  Neurological:     General: No focal deficit present.     Mental Status: He is alert and oriented to person, place, and time. Mental status is at baseline.     Cranial Nerves: No cranial nerve deficit.     Sensory: No sensory deficit.     Motor: No weakness.     Coordination: Coordination normal.     Gait: Gait normal.     Deep Tendon Reflexes: Reflexes normal.  Psychiatric:        Mood and Affect: Mood normal.        Behavior: Behavior normal.        Thought Content: Thought content normal.        Judgment: Judgment normal.     LABS:      Latest Ref Rng & Units 03/01/2023    1:57 PM 02/01/2023    2:23 PM 01/04/2023    1:23 PM  CBC  WBC 4.0 - 10.5 K/uL 13.1  12.8  17.1   Hemoglobin 13.0 - 17.0 g/dL 56.3  87.5  64.3   Hematocrit 39.0 - 52.0 % 39.4  41.2  45.6   Platelets 150 - 400 K/uL 247  247  360       Latest Ref Rng & Units 03/01/2023    1:57 PM 02/01/2023    2:23 PM 01/04/2023    1:23 PM  CMP  Glucose 70 - 99 mg/dL 329  93  518   BUN 8 - 23 mg/dL 18  23  27    Creatinine 0.61 - 1.24 mg/dL 8.41  6.60  6.30   Sodium 135 - 145 mmol/L 135  137  137   Potassium 3.5 - 5.1 mmol/L 3.8  3.9  4.2   Chloride 98 - 111 mmol/L 107  106  105   CO2 22 - 32 mmol/L 22  22  22    Calcium 8.9 - 10.3 mg/dL 8.5  8.8  8.8   Total Protein 6.5 - 8.1 g/dL 7.2  7.1  7.9   Total Bilirubin 0.3 - 1.2 mg/dL 0.9  0.6  0.6   Alkaline Phos 38 - 126 U/L 78  65  66   AST 15 - 41 U/L 18  22  26    ALT 0 - 44 U/L 16  20  22     Component Ref Range & Units 03/01/2023 1 mo ago 2 mo ago 3 mo ago 4 mo ago 5 mo ago 8 mo ago  TSH 0.350 - 4.500 uIU/mL 1.157 0.971 CM 1.052 CM 1.468 CM 1.490 CM 1.551 CM 1.223 CM              Component Ref Range & Units 03/01/2023 1 mo ago 2 mo ago 3 mo ago 4 mo ago 5 mo ago 8 mo ago  T4, Total 4.5 - 12.0 ug/dL 7.0 7.2 CM 8.8 CM 8.3 CM 9.3 CM 8.1 CM 7.4 CM        No results found for: "CEA1", "CEA" / No results found for: "CEA1",  "CEA" No results found for: "PSA1" No  results found for: "CAN199" No results found for: "CAN125"  No results found for: "TOTALPROTELP", "ALBUMINELP", "A1GS", "A2GS", "BETS", "BETA2SER", "GAMS", "MSPIKE", "SPEI" No results found for: "TIBC", "FERRITIN", "IRONPCTSAT" Lab Results  Component Value Date   LDH 140 02/01/2023   LDH 160 02/07/2022    STUDIES:  Exam: 01/31/23 PET WHOLE BODY IMPRESSION: Interval resolution of previously demonstrated hypermetabolic activity within the left foot. No evidence of local recurrence of metastatic disease. Resolution of right middle lobe airspace diseases seen on most recent CT. No suspicious pulmonary nodularity. New opacification of the maxillary sinus with mild peripheral hypermetabolic activity, likely inflammatory Aortic Atherosclerosis (ICD10-170.0)  Exam: 08/16/22 CT Chest, Abdomen, and Pelvis with contrast Chest Impressions: The 2 dominant nodules in the RIGHT lung previously documented are not readily identified; however, there is a new branching nodularity/consolidation in RIGHT middle lobe. RIGHT middle lobe findings favor infectious or inflammatory process. No metastatic mediastinal adenopathy identified No cutaneous lesion.  Abdomen / Pelvis Impression: No evidence of metastatic disease in the abdomen pelvis. Benign Bosniak 1 renal cysts. No follow-up recommended. No lymphadenopathy or peritoneal metastasis.      I,Oluwatobi Asade,acting as a scribe for [name], MD.,have documented all relevant documentation on the behalf of [name], MD,as directed by  [name], MD while in the presence of [name], MD.   I have reviewed this report as typed by the medical scribe, and it is complete and accurate.  Oluwatobi Asade   03/28/23 1:29 PM

## 2023-03-29 ENCOUNTER — Inpatient Hospital Stay: Payer: Medicare HMO

## 2023-03-29 ENCOUNTER — Inpatient Hospital Stay: Payer: Medicare HMO | Admitting: Oncology

## 2023-03-29 ENCOUNTER — Encounter: Payer: Self-pay | Admitting: Oncology

## 2023-03-29 ENCOUNTER — Inpatient Hospital Stay: Payer: Medicare HMO | Attending: Oncology

## 2023-03-29 DIAGNOSIS — C792 Secondary malignant neoplasm of skin: Secondary | ICD-10-CM

## 2023-03-29 DIAGNOSIS — C439 Malignant melanoma of skin, unspecified: Secondary | ICD-10-CM

## 2023-03-29 DIAGNOSIS — Z5112 Encounter for antineoplastic immunotherapy: Secondary | ICD-10-CM | POA: Diagnosis not present

## 2023-03-29 DIAGNOSIS — C4372 Malignant melanoma of left lower limb, including hip: Secondary | ICD-10-CM | POA: Diagnosis not present

## 2023-03-29 DIAGNOSIS — Z79899 Other long term (current) drug therapy: Secondary | ICD-10-CM | POA: Insufficient documentation

## 2023-03-29 LAB — CBC WITH DIFFERENTIAL (CANCER CENTER ONLY)
Abs Immature Granulocytes: 0.04 10*3/uL (ref 0.00–0.07)
Basophils Absolute: 0 10*3/uL (ref 0.0–0.1)
Basophils Relative: 0 %
Eosinophils Absolute: 0.7 10*3/uL — ABNORMAL HIGH (ref 0.0–0.5)
Eosinophils Relative: 5 %
HCT: 40.4 % (ref 39.0–52.0)
Hemoglobin: 13.7 g/dL (ref 13.0–17.0)
Immature Granulocytes: 0 %
Lymphocytes Relative: 21 %
Lymphs Abs: 2.7 10*3/uL (ref 0.7–4.0)
MCH: 29.8 pg (ref 26.0–34.0)
MCHC: 33.9 g/dL (ref 30.0–36.0)
MCV: 88 fL (ref 80.0–100.0)
Monocytes Absolute: 1 10*3/uL (ref 0.1–1.0)
Monocytes Relative: 8 %
Neutro Abs: 8.4 10*3/uL — ABNORMAL HIGH (ref 1.7–7.7)
Neutrophils Relative %: 66 %
Platelet Count: 235 10*3/uL (ref 150–400)
RBC: 4.59 MIL/uL (ref 4.22–5.81)
RDW: 12.9 % (ref 11.5–15.5)
WBC Count: 12.8 10*3/uL — ABNORMAL HIGH (ref 4.0–10.5)
nRBC: 0 % (ref 0.0–0.2)

## 2023-03-29 LAB — CMP (CANCER CENTER ONLY)
ALT: 18 U/L (ref 0–44)
AST: 17 U/L (ref 15–41)
Albumin: 4.3 g/dL (ref 3.5–5.0)
Alkaline Phosphatase: 80 U/L (ref 38–126)
Anion gap: 9 (ref 5–15)
BUN: 23 mg/dL (ref 8–23)
CO2: 25 mmol/L (ref 22–32)
Calcium: 9.2 mg/dL (ref 8.9–10.3)
Chloride: 105 mmol/L (ref 98–111)
Creatinine: 0.97 mg/dL (ref 0.61–1.24)
GFR, Estimated: >60 mL/min (ref >60)
Glucose, Bld: 92 mg/dL (ref 70–99)
Potassium: 3.9 mmol/L (ref 3.5–5.1)
Sodium: 139 mmol/L (ref 135–145)
Total Bilirubin: 1 mg/dL (ref 0.3–1.2)
Total Protein: 7.4 g/dL (ref 6.5–8.1)

## 2023-03-29 LAB — TSH: TSH: 1.189 u[IU]/mL (ref 0.350–4.500)

## 2023-03-29 NOTE — Progress Notes (Signed)
[facility]  [address] [place],  [place]  [address] [phone]  Clinic Day:03/29/23   Referring physician: [name], MD  ASSESSMENT & PLAN:  Assessment: Malignant melanoma of the left foot He had a punch biopsy and this reveals a Breslow thickness of 1.6 mm and Clark's level 4 with ulceration for a T2b N0 M0 clinically.  We found no distant metastasis on PET scan, but he did have metastasis to the left lower extremity.  His tumor is positive for BRAF and PDL1 mutation. He had this treated with wide local excision.   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   Multiple lesions of the lung These were suspicious for metastatic disease but have now resolved on the CT scan on 08/12/22.  The PET scan was negative but we cannot absolutely rule out metastatic disease since these lesions are small. We discussed the fact that the resolution of these lesions could represent a response to treatment, or may not have been malignant at all. I think it is more likely the former, and so I recommend continuation of therapy.  We have had several discussions about this.  Spinal stenosis at L4-5 He has had surgical decompression and resection of a synovial cyst as of January 26 2022.  Leukocytosis He has had chronic leukocytosis and no symptoms of infection and urinalysis and urine culture were negative.  We did not find an explanation but the [facility] continue to fluctuate up and down.  Lymphedema of the Left Lower Extremity We discussed this diagnosis and the limited approaches available. For now we will just monitor it.   Plan: He had a whole body PET scan on 01/31/2023 which revealed interval resolution of previously demonstrated hypermetabolic activity within the left foot,  no evidence of local recurrence of metastatic disease, and resolution of right middle lobe airspace diseases seen on most recent CT. There is no suspicious pulmonary nodularity, but he has new opacification of the maxillary sinus with mild peripheral hypermetabolic activity, likely inflammatory, and aortic atherosclerosis.  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  His day 1 cycle 15 of Nivolumab is scheduled for 04/02/2023. His labs are pending today. He is taking 2 calcium pills daily.  I will see him  back in 4 weeks with CBC and CMP.The patient was provided an opportunity to ask questions and all were answered.  The patient agreed with the plan and demonstrated an understanding of the instructions.  The patient was advised to call back if the symptoms worsen or if the condition fails to improve as anticipated.  I provided 15  minutes of face-to-face time during this this encounter and > 50% was spent counseling as documented under my assessment and plan.   [name], MD [facility] AT [facility] [address] [place] [address] Dept: [phone] Dept Fax: [phone]   CHIEF COMPLAINT:  CC: Malignant melanoma  Current Treatment: Evaluation and immunotherapy   HISTORY OF PRESENT ILLNESS:  [name] is a 77 y.o. male with a history of malignant melanoma who is referred in consultation with Dr. Luna Kitchens for assessment and management.  He had a lesion of the dorsum of the left foot in September 2022 which appeared  to be a granuloma and was treated in the office.  He later developed a another lesion lateral to this on the dorsum of the left foot which was not hyperpigmented but did bleed easily.  He also has several lesions of his left medial calf which are nodular and span approximately 6 cm.  He has had some increased edema of the left lower leg.  He was evaluated by dermatology on June 5, who felt  this could be a squamous cell carcinoma versus tinea or a drug reaction.  He had biopsies done of both the foot lesion and the Lesion.  Pathology came back that the left dorsal foot shave biopsy revealed a malignant melanoma possibly a primary nodular ulcerated melanoma which had a Breslow thickness of at least 1.6 mm and a Clark's level 4.  Margins are positive as this was a shave biopsy.  Ulceration is present but no satellitosis.  The mitotic index is 4/mm and no lymphovascular invasion was identified.  No brisk tumor infiltrating lymphocytes are seen and tumor regression is absent.  This is felt to be at least a T2b lesion and wide excision was recommended.  The lesion of the left medial calf revealed a dermal map melanoma most consistent with metastatic disease.  This information was not available when the patient was admitted for back surgery on June 8 and he had a microlumbar decompression at L4-5 for spinal stenosis.  He was also found to have a synovial cyst at that time which was resected and benign.  He was readmitted several days later for acute pain of the right elbow and both arms.  Aspiration was obtained and this was diagnosed as gouty arthritis.  He did have temporary rehab after his back surgery.  His left leg was evaluated for deep venous thrombosis and was negative.  While he was recuperating from his surgery he had an x-ray which was abnormal which led to a CT scan which revealed at least 3 lung lesions.  MRI of the brain was negative and he is now referred for management of the malignant melanoma. He had a whole body PET scan on 01/31/2023 which revealed interval resolution of previously demonstrated hypermetabolic activity within the left foot, no evidence of local recurrence of metastatic disease, resolution of right middle lobe airspace diseases seen on most recent CT. No suspicious pulmonary nodularity, new opacification of the maxillary sinus with mild peripheral hypermetabolic  activity, likely inflammatory, and aortic atherosclerosis. He has a BRAF and PDL1 mutation.  Oncology History  Malignant melanoma, metastatic (HCC)  01/23/2022 Cancer Staging   Staging form: Melanoma of the Skin, AJCC 8th Edition - Clinical stage from 01/23/2022: Stage III (cT2b, cN1c, cM0) - Signed by [name], MD on 04/07/2022 Histopathologic type: Malignant melanoma, NOS (except juvenile melanoma M-[id number]) Stage prefix: Initial diagnosis Laterality: Left Lymph-vascular invasion (LVI): LVI not present (absent)/not identified Diagnostic confirmation: Positive histology Specimen type: Biopsy / Limited Resection Staged by: Managing physician Mitotic count: 4 Mitotic unit: mm2 Clark's level: Level IV Tumor-infiltrating lymphocytes: Present and non-brisk Breslow depth (mm): 16 Ulceration of the epidermis: Yes Microsatellites: No Primary tumor regression: Absent Intransit/satellite metastasis: In-transit Lymph node clinically or radiologically detected: No Microscopic confirmation of metastasis: No Sentinel lymph node biopsy performed: No Completion or therapeutic lymph node dissection performed: No Matted nodes: No Stage used in treatment planning: Yes National guidelines used in treatment planning: Yes Type of national guideline used in treatment planning: NCCN   01/31/2022 Initial Diagnosis   Malignant  melanoma, metastatic (HCC)   04/14/2022 -  Chemotherapy   Patient is on Treatment Plan : MELANOMA Nivolumab (480) q28d (changed from q14d on 12/4)     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 [facility])  02/28/2022 Cancer Staging   Staging form: Melanoma of the Skin, AJCC 8th Edition - Clinical stage from 02/28/2022: Stage IV (cT2b(m), cN1c, cM1a) - Signed by [name], MD on 04/07/2022 Histopathologic type: Malignant melanoma, NOS (except juvenile melanoma M-[id number]) Stage prefix: Initial diagnosis Laterality: Left Multiple tumors: Yes Lymph-vascular invasion (LVI):  LVI present/identified, NOS Diagnostic confirmation: Positive histology Specimen type: Excision Staged by: Managing physician Intransit/satellite metastasis: Both in-transit and satellite Prognostic indicators: 02/28/22:  Wide excision of primary of foot - no residual melanoma,  Excision of skin of calf consistent with intransit mets of dermal/subcuticular   Melanoma with LVI, metastatic disease with multifocal lesions and multifocal positive margins 03/24/22: Re-excision with a few atypical melanocytes, no melanoma and clear margins   04/07/2022 Initial Diagnosis   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 Patient started Nivolumab and Ipilimumab on 04/14/2022. He has a BRAF and PDL1 mutation.  Patient states that he feels well and has no complaint of pain. 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  His day 1 cycle 15 of Nivolumab is scheduled for 04/02/2023. His labs are pending today. He is taking 2 calcium pills daily.  I will see him  back in 4 weeks with CBC and CMP. He  denies signs of infections such as sore throat, sinus drainage, cough or urinary symptoms. He  denies fever or recurrent chills. He  also deny nausea, vomiting, chest pain, or dyspnea. 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 , 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     Past Surgical History:  Procedure Laterality Date   LUMBAR LAMINECTOMY/DECOMPRESSION MICRODISCECTOMY N/A 01/26/2022   Procedure: Microlumbar decompression Lumbar four-five, lateral mass fusion with autograft and allograft bone;  Surgeon: Jene Every, MD;  Location: [facility] OR;  Service: Orthopedics;  Laterality: N/A;   PARTIAL GASTRECTOMY     TOTAL HIP ARTHROPLASTY Right 06/18/2018   Procedure: RIGHT TOTAL HIP ARTHROPLASTY ANTERIOR APPROACH;  Surgeon: Durene Romans, MD;  Location: [facility] ORS;  Service: Orthopedics;  Laterality: Right;    Family History  Problem Relation Age of Onset   Lung cancer Sister    Melanoma Brother     Social History:  reports that he has quit smoking. He has never used smokeless tobacco. He reports that he does not currently use alcohol. He reports that he does not use drugs.    Allergies:  Allergies  Allergen Reactions   Morphine Itching  Patient states it is tolerable itching    Current Medications: Current Outpatient Medications  Medication Sig Dispense Refill   Calcium Carbonate (CALCIUM 600 PO) Take 1 tablet by mouth 2 (two) times daily.     NEOMYCIN-POLYMYXIN-HYDROCORTISONE (CORTISPORIN) 1 % SOLN OTIC solution      amLODipine (NORVASC) 5 MG tablet Take 5 mg by mouth daily.     clopidogrel (PLAVIX) 75 MG tablet Take 1 tablet by mouth daily.     docusate sodium (COLACE) 100 MG capsule 1 capsule 2 TIMES DAILY (route: oral)     famotidine (PEPCID) 40 MG tablet Take 1 tablet (40 mg total) by mouth daily. 30 tablet 5   folic acid (FOLVITE) 400 MCG tablet Take 400 mcg by mouth daily.     gabapentin (NEURONTIN) 300 MG capsule Take 1 capsule (300 mg total) by mouth 3 (three) times daily. 270 capsule 3   hydrochlorothiazide (HYDRODIURIL) 12.5 MG tablet Take 12.5 mg by mouth daily.     lisinopril (ZESTRIL) 20 MG tablet      Multiple Vitamin (MULTIVITAMIN WITH MINERALS) TABS tablet Take 1 tablet by mouth in the morning.     mupirocin ointment (BACTROBAN) 2 %  Apply topically.     Omega-3 1000 MG CAPS Take by mouth.     ondansetron (ZOFRAN) 4 MG tablet Take 1 tablet (4 mg total) by mouth every 4 (four) hours as needed for nausea. 90 tablet 3   oxyCODONE (OXY IR/ROXICODONE) 5 MG immediate release tablet Take 1 tablet (5 mg total) by mouth every 6 (six) hours as needed for severe pain. 30 tablet 0   polyethylene glycol (MIRALAX / GLYCOLAX) 17 g packet Take 17 g by mouth daily. 14 each 0   pravastatin (PRAVACHOL) 40 MG tablet Take 40 mg by mouth daily.     prochlorperazine (COMPAZINE) 10 MG tablet Take 1 tablet (10 mg total) by mouth every 6 (six) hours as needed for nausea or vomiting. 90 tablet 3   traZODone (DESYREL) 50 MG tablet Take 1 tablet (50 mg total) by mouth at bedtime as needed for sleep. 30 tablet 5   No current facility-administered medications for this visit.    REVIEW OF SYSTEMS:  Review of Systems  Constitutional: Negative.  Negative for appetite change, chills, diaphoresis, fatigue, fever and unexpected weight change.  HENT:  Negative.  Negative for hearing loss, lump/mass, mouth sores, nosebleeds, sore throat, tinnitus, trouble swallowing and voice change.   Eyes: Negative.  Negative for eye problems and icterus.  Respiratory: Negative.  Negative for chest tightness, cough, hemoptysis, shortness of breath and wheezing.   Cardiovascular:  Positive for leg swelling (LLE and leg pain). Negative for chest pain and palpitations.  Gastrointestinal: Negative.  Negative for abdominal distention, abdominal pain, blood in stool, constipation, diarrhea, nausea, rectal pain and vomiting.  Endocrine: Negative.  Negative for hot flashes.  Genitourinary: Negative.  Negative for bladder incontinence, difficulty urinating, dyspareunia, dysuria, frequency, hematuria, nocturia, pelvic pain and penile discharge.   Musculoskeletal:  Positive for back pain (Chronic, improved). Negative for arthralgias, flank pain, gait problem, myalgias, neck pain and neck  stiffness.  Skin: Negative.  Negative for itching, rash and wound.       Well healed scar below the medial knee. Vitiligo patches on his upper and lower extremities.   Neurological: Negative.  Negative for dizziness, extremity weakness, gait problem, headaches, light-headedness, numbness, seizures and speech difficulty.  Hematological: Negative.  Negative for adenopathy. Does not bruise/bleed easily.  Psychiatric/Behavioral: Negative.  Negative  for confusion, decreased concentration, depression, sleep disturbance and suicidal ideas. The patient is not nervous/anxious.      VITALS:  Blood pressure (!) 140/69, pulse 68, temperature 97.9 F (36.6 C), temperature source Oral, resp. rate 18, height 5' 9.39" (1.763 m), weight 233 lb 4.8 oz (105.8 kg), SpO2 95%.  Wt Readings from Last 3 Encounters:  04/02/23 234 lb 0.6 oz (106.2 kg)  03/29/23 233 lb 4.8 oz (105.8 kg)  03/05/23 233 lb (105.7 kg)    Body mass index is 34.07 kg/m.  Performance status (ECOG): 1 - Symptomatic but completely ambulatory  PHYSICAL EXAM:  Physical Exam Vitals and nursing note reviewed. Exam conducted with a chaperone present.  Constitutional:      General: He is not in acute distress.    Appearance: Normal appearance. He is normal weight. He is not ill-appearing, toxic-appearing or diaphoretic.  HENT:     Head: Normocephalic and atraumatic.     Right Ear: Tympanic membrane, ear canal and external ear normal. There is no impacted cerumen.     Left Ear: Tympanic membrane, ear canal and external ear normal. There is no impacted cerumen.     Nose: Nose normal. No congestion or rhinorrhea.     Mouth/Throat:     Mouth: Mucous membranes are moist.     Pharynx: Oropharynx is clear. No oropharyngeal exudate or posterior oropharyngeal erythema.  Eyes:     General: No scleral icterus.       Right eye: No discharge.        Left eye: No discharge.     Extraocular Movements: Extraocular movements intact.      Conjunctiva/sclera: Conjunctivae normal.     Pupils: Pupils are equal, round, and reactive to light.  Neck:     Vascular: No carotid bruit.  Cardiovascular:     Rate and Rhythm: Normal rate and regular rhythm.     Pulses: Normal pulses.     Heart sounds: Normal heart sounds. No murmur heard.    No friction rub. No gallop.  Pulmonary:     Effort: Pulmonary effort is normal. No respiratory distress.     Breath sounds: Normal breath sounds. No stridor. No wheezing, rhonchi or rales.  Chest:     Chest wall: No tenderness.  Abdominal:     General: Bowel sounds are normal. There is no distension.     Palpations: Abdomen is soft. There is no hepatomegaly, splenomegaly or mass.     Tenderness: There is no abdominal tenderness. There is no right CVA tenderness, left CVA tenderness, guarding or rebound.     Hernia: No hernia is present.     Comments: Mid line abdominal scar that is well healed.   Musculoskeletal:        General: No swelling, tenderness, deformity or signs of injury. Normal range of motion.     Cervical back: Normal range of motion and neck supple. No rigidity or tenderness.     Right lower leg: No edema.     Left lower leg: 1+ Edema present.     Comments: Wound of the medial left knee is well healed with extensive scar tissue (unchanged)  Feet:     Comments: 2+ edema Healing scar on the dorsum of the left foot.   Lymphadenopathy:     Cervical: No cervical adenopathy.     Upper Body:     Right upper body: No supraclavicular or axillary adenopathy.     Left upper body: No supraclavicular or axillary adenopathy.  Lower Body: No right inguinal adenopathy. No left inguinal adenopathy.  Skin:    General: Skin is warm and dry.     Coloration: Skin is not jaundiced or pale.     Findings: No bruising, erythema, lesion or rash.     Comments: His skin has patches of vitiligo on both upper extrimities and left lower extrimity  Neurological:     General: No focal deficit  present.     Mental Status: He is alert and oriented to person, place, and time. Mental status is at baseline.     Cranial Nerves: No cranial nerve deficit.     Sensory: No sensory deficit.     Motor: No weakness.     Coordination: Coordination normal.     Gait: Gait normal.     Deep Tendon Reflexes: Reflexes normal.  Psychiatric:        Mood and Affect: Mood normal.        Behavior: Behavior normal.        Thought Content: Thought content normal.        Judgment: Judgment normal.     LABS:      Latest Ref Rng & Units 03/29/2023    1:08 PM 03/01/2023    1:57 PM 02/01/2023    2:23 PM  CBC  WBC 4.0 - 10.5 K/uL 12.8  13.1  12.8   Hemoglobin 13.0 - 17.0 g/dL 40.9  81.1  91.4   Hematocrit 39.0 - 52.0 % 40.4  39.4  41.2   Platelets 150 - 400 K/uL 235  247  247       Latest Ref Rng & Units 03/29/2023    1:08 PM 03/01/2023    1:57 PM 02/01/2023    2:23 PM  CMP  Glucose 70 - 99 mg/dL 92  782  93   BUN 8 - 23 mg/dL 23  18  23    Creatinine 0.61 - 1.24 mg/dL 9.56  2.13  0.86   Sodium 135 - 145 mmol/L 139  135  137   Potassium 3.5 - 5.1 mmol/L 3.9  3.8  3.9   Chloride 98 - 111 mmol/L 105  107  106   CO2 22 - 32 mmol/L 25  22  22    Calcium 8.9 - 10.3 mg/dL 9.2  8.5  8.8   Total Protein 6.5 - 8.1 g/dL 7.4  7.2  7.1   Total Bilirubin 0.3 - 1.2 mg/dL 1.0  0.9  0.6   Alkaline Phos 38 - 126 U/L 80  78  65   AST 15 - 41 U/L 17  18  22    ALT 0 - 44 U/L 18  16  20     Component Ref Range & Units 02/01/2023 01/04/2023 1 mo ago 2 mo ago 3 mo ago 6 mo ago 8 mo ago 9 mo ago  TSH 0.350 - 4.500 uIU/mL 0.971 1.052 1.468 CM 1.490 CM 1.551 CM 1.223 CM 1.060 CM 1.166 CM     Component Ref Range & Units 02/01/2023 01/04/2023 1 mo ago 2 mo ago 3 mo ago 6 mo ago 8 mo ago 9 mo ago  T4, Total 4.5 - 12.0 ug/dL 7.2 8.8 8.3 CM 9.3 CM 8.1 CM 7.4 CM 7.5 CM 8.0 CM      No results found for: "CEA1", "CEA" / No results found for: "CEA1", "CEA" No results found for: "PSA1" No results found for: "VHQ469" No  results found for: "CAN125"  No results found for: "TOTALPROTELP", "ALBUMINELP", "A1GS", "  A2GS", "BETS", "BETA2SER", "GAMS", "MSPIKE", "SPEI" No results found for: "TIBC", "FERRITIN", "IRONPCTSAT" Lab Results  Component Value Date   LDH 140 02/01/2023   LDH 160 02/07/2022    STUDIES:  Exam: 01/31/23 PET WHOLE BODY IMPRESSION: Interval resolution of previously demonstrated hypermetabolic activity within the left foot. No evidence of local recurrence of metastatic disease. Resolution of right middle lobe airspace diseases seen on most recent CT. No suspicious pulmonary nodularity. New opacification of the maxillary sinus with mild peripheral hypermetabolic activity, likely inflammatory Aortic Atherosclerosis (ICD10-170.0)  Exam: 08/16/22 CT Chest, Abdomen, and Pelvis with contrast Chest Impressions: The 2 dominant nodules in the RIGHT lung previously documented are not readily identified; however, there is a new branching nodularity/consolidation in RIGHT middle lobe. RIGHT middle lobe findings favor infectious or inflammatory process. No metastatic mediastinal adenopathy identified No cutaneous lesion.  Abdomen / Pelvis Impression: No evidence of metastatic disease in the abdomen pelvis. Benign Bosniak 1 renal cysts. No follow-up recommended. No lymphadenopathy or peritoneal metastasis.      I,Oluwatobi Asade,acting as a scribe for [name], MD.,have documented all relevant documentation on the behalf of [name], MD,as directed by  [name], MD while in the presence of [name], MD.   I have reviewed this report as typed by the medical scribe, and it is complete and accurate.  [name]   04/18/23 8:46 PM

## 2023-03-30 MED FILL — Nivolumab IV Soln 240 MG/24ML: INTRAVENOUS | Qty: 48 | Status: AC

## 2023-04-02 ENCOUNTER — Inpatient Hospital Stay: Payer: Medicare HMO

## 2023-04-02 VITALS — BP 149/72 | HR 87 | Temp 98.6°F | Resp 18 | Ht 69.39 in | Wt 234.0 lb

## 2023-04-02 DIAGNOSIS — C4372 Malignant melanoma of left lower limb, including hip: Secondary | ICD-10-CM | POA: Diagnosis not present

## 2023-04-02 DIAGNOSIS — C439 Malignant melanoma of skin, unspecified: Secondary | ICD-10-CM

## 2023-04-02 DIAGNOSIS — C792 Secondary malignant neoplasm of skin: Secondary | ICD-10-CM

## 2023-04-02 DIAGNOSIS — Z79899 Other long term (current) drug therapy: Secondary | ICD-10-CM | POA: Diagnosis not present

## 2023-04-02 DIAGNOSIS — Z5112 Encounter for antineoplastic immunotherapy: Secondary | ICD-10-CM | POA: Diagnosis not present

## 2023-04-02 MED ORDER — HEPARIN SOD (PORK) LOCK FLUSH 100 UNIT/ML IV SOLN
500.0000 [IU] | Freq: Once | INTRAVENOUS | Status: AC | PRN
  Administered 2023-04-02: 500 [IU]

## 2023-04-02 MED ORDER — SODIUM CHLORIDE 0.9 % IV SOLN
480.0000 mg | Freq: Once | INTRAVENOUS | Status: AC
  Administered 2023-04-02: 480 mg via INTRAVENOUS
  Filled 2023-04-02: qty 48

## 2023-04-02 MED ORDER — SODIUM CHLORIDE 0.9 % IV SOLN: Freq: Once | INTRAVENOUS | Status: AC

## 2023-04-02 MED ORDER — SODIUM CHLORIDE 0.9% FLUSH
10.0000 mL | INTRAVENOUS | Status: DC | PRN
  Administered 2023-04-02: 10 mL

## 2023-04-02 NOTE — Patient Instructions (Signed)
Nivolumab Injection What is this medication? NIVOLUMAB (nye VOL ue mab) treats some types of cancer. It works by helping your immune system slow or stop the spread of cancer cells. It is a monoclonal antibody. This medicine may be used for other purposes; ask your health care provider or pharmacist if you have questions. COMMON BRAND NAME(S): Opdivo What should I tell my care team before I take this medication? They need to know if you have any of these conditions: Allogeneic stem cell transplant (uses someone else's stem cells) Autoimmune diseases, such as Crohn disease, ulcerative colitis, lupus History of chest radiation Nervous system problems, such as Guillain-Barre syndrome or myasthenia gravis Organ transplant An unusual or allergic reaction to nivolumab, other medications, foods, dyes, or preservatives Pregnant or trying to get pregnant Breast-feeding How should I use this medication? This medication is infused into a vein. It is given in a hospital or clinic setting. A special MedGuide will be given to you before each treatment. Be sure to read this information carefully each time. Talk to your care team about the use of this medication in children. While it may be prescribed for children as young as 12 years for selected conditions, precautions do apply. Overdosage: If you think you have taken too much of this medicine contact a poison control center or emergency room at once. NOTE: This medicine is only for you. Do not share this medicine with others. What if I miss a dose? Keep appointments for follow-up doses. It is important not to miss your dose. Call your care team if you are unable to keep an appointment. What may interact with this medication? Interactions have not been studied. This list may not describe all possible interactions. Give your health care provider a list of all the medicines, herbs, non-prescription drugs, or dietary supplements you use. Also tell them if you  smoke, drink alcohol, or use illegal drugs. Some items may interact with your medicine. What should I watch for while using this medication? Your condition will be monitored carefully while you are receiving this medication. You may need blood work while taking this medication. This medication may cause serious skin reactions. They can happen weeks to months after starting the medication. Contact your care team right away if you notice fevers or flu-like symptoms with a rash. The rash may be red or purple and then turn into blisters or peeling of the skin. You may also notice a red rash with swelling of the face, lips, or lymph nodes in your neck or under your arms. Tell your care team right away if you have any change in your eyesight. Talk to your care team if you are pregnant or think you might be pregnant. A negative pregnancy test is required before starting this medication. A reliable form of contraception is recommended while taking this medication and for 5 months after the last dose. Talk to your care team about effective forms of contraception. Do not breast-feed while taking this medication and for 5 months after the last dose. What side effects may I notice from receiving this medication? Side effects that you should report to your care team as soon as possible: Allergic reactions--skin rash, itching, hives, swelling of the face, lips, tongue, or throat Dry cough, shortness of breath or trouble breathing Eye pain, redness, irritation, or discharge with blurry or decreased vision Heart muscle inflammation--unusual weakness or fatigue, shortness of breath, chest pain, fast or irregular heartbeat, dizziness, swelling of the ankles, feet, or hands Hormone   gland problems--headache, sensitivity to light, unusual weakness or fatigue, dizziness, fast or irregular heartbeat, increased sensitivity to cold or heat, excessive sweating, constipation, hair loss, increased thirst or amount of urine,  tremors or shaking, irritability Infusion reactions--chest pain, shortness of breath or trouble breathing, feeling faint or lightheaded Kidney injury (glomerulonephritis)--decrease in the amount of urine, red or dark brown urine, foamy or bubbly urine, swelling of the ankles, hands, or feet Liver injury--right upper belly pain, loss of appetite, nausea, light-colored stool, dark yellow or brown urine, yellowing skin or eyes, unusual weakness or fatigue Pain, tingling, or numbness in the hands or feet, muscle weakness, change in vision, confusion or trouble speaking, loss of balance or coordination, trouble walking, seizures Rash, fever, and swollen lymph nodes Redness, blistering, peeling, or loosening of the skin, including inside the mouth Sudden or severe stomach pain, bloody diarrhea, fever, nausea, vomiting Side effects that usually do not require medical attention (report these to your care team if they continue or are bothersome): Bone, joint, or muscle pain Diarrhea Fatigue Loss of appetite Nausea Skin rash This list may not describe all possible side effects. Call your doctor for medical advice about side effects. You may report side effects to FDA at 1-800-FDA-1088. Where should I keep my medication? This medication is given in a hospital or clinic. It will not be stored at home. NOTE: This sheet is a summary. It may not cover all possible information. If you have questions about this medicine, talk to your doctor, pharmacist, or health care provider.  2024 [employer] (2021-12-05 00:00:00)  

## 2023-04-18 ENCOUNTER — Encounter: Payer: Self-pay | Admitting: Oncology

## 2023-04-25 NOTE — Progress Notes (Signed)
[name]  [address] [place],  [place]  [address] [phone]  Clinic Day: 04/26/23  Referring physician: [name], MD  ASSESSMENT & PLAN:  Assessment: Malignant melanoma of the left foot He had a punch biopsy and this reveals a Breslow thickness of 1.6 mm and Clark's level 4 with ulceration for a T2b N0 M0 clinically.  We found no distant metastasis on PET scan, but he did have metastasis to the left lower extremity.  His tumor is positive for BRAF and PDL1 mutation. He had this treated with wide local excision.   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   Multiple lesions of the lung These were suspicious for metastatic disease but have now resolved on the CT scan on 08/12/22.  The PET scan was negative but we cannot absolutely rule out metastatic disease since these lesions are small. We discussed the fact that the resolution of these lesions could represent a response to treatment, or may not have been malignant at all. I think it is more likely the former, and so I recommend continuation of therapy.  We have had several discussions about this.  Spinal stenosis at L4-5 He has had surgical decompression and resection of a synovial cyst as of January 26 2022.  Leukocytosis He has had chronic leukocytosis and no symptoms of infection and urinalysis and urine culture were negative.  We did not find an explanation but the [facility] continue to fluctuate up and down.  Lymphedema of the Left Lower Extremity We discussed this diagnosis and the limited approaches available. For now we will just monitor it.   Plan: He had a whole body PET scan on 01/31/2023 which revealed interval resolution of previously demonstrated hypermetabolic activity within the left foot,  no evidence of local recurrence of metastatic disease, and resolution of right middle lobe airspace diseases seen on most recent CT. There is no suspicious pulmonary nodularity, but he has new opacification of the maxillary sinus with mild peripheral hypermetabolic activity, likely inflammatory, and aortic atherosclerosis.  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  His day 1 cycle 15 of Nivolumab is scheduled for 04/02/2023. His labs are pending today. He is taking 2 calcium pills daily. He has now completed 1 year of immunotherapy. We once again discussed duration of therapy and I have recommended that we continue treatment since he had metastatic disease to the left leg and probably to the lung, all resolved now.  I will see him  back in 4 weeks with CBC and CMP.The patient was provided an opportunity to ask questions and all were answered.  The patient agreed with the plan and demonstrated an understanding of the instructions.  The patient was advised to call back if the symptoms worsen or if the condition fails to improve as anticipated.  I provided 15  minutes of face-to-face time during this this encounter and > 50% was spent counseling as documented under my assessment and plan.   [name], MD [facility] [address] [place] [address] Dept: [phone] Dept Fax: [phone]   CHIEF COMPLAINT:  CC: Malignant melanoma  Current Treatment: Evaluation and immunotherapy   HISTORY OF PRESENT ILLNESS:  [name] is  a 77 y.o. male with a history of malignant melanoma who is referred in consultation with Dr. Luna Kitchens for assessment and management.  He had a lesion of the dorsum of the left foot in September 2022 which appeared to be a granuloma and was treated in the office.  He later developed a another lesion lateral to this on the dorsum of the left foot which was not hyperpigmented  but did bleed easily.  He also has several lesions of his left medial calf which are nodular and span approximately 6 cm.  He has had some increased edema of the left lower leg.  He was evaluated by dermatology on June 5, who felt this could be a squamous cell carcinoma versus tinea or a drug reaction.  He had biopsies done of both the foot lesion and the Lesion.  Pathology came back that the left dorsal foot shave biopsy revealed a malignant melanoma possibly a primary nodular ulcerated melanoma which had a Breslow thickness of at least 1.6 mm and a Clark's level 4.  Margins are positive as this was a shave biopsy.  Ulceration is present but no satellitosis.  The mitotic index is 4/mm and no lymphovascular invasion was identified.  No brisk tumor infiltrating lymphocytes are seen and tumor regression is absent.  This is felt to be at least a T2b lesion and wide excision was recommended.  The lesion of the left medial calf revealed a dermal map melanoma most consistent with metastatic disease.  This information was not available when the patient was admitted for back surgery on June 8 and he had a microlumbar decompression at L4-5 for spinal stenosis.  He was also found to have a synovial cyst at that time which was resected and benign.  He was readmitted several days later for acute pain of the right elbow and both arms.  Aspiration was obtained and this was diagnosed as gouty arthritis.  He did have temporary rehab after his back surgery.  His left leg was evaluated for deep venous thrombosis and was negative.  While he was recuperating from his surgery he had an x-ray which was abnormal which led to a CT scan which revealed at least 3 lung lesions.  MRI of the brain was negative and he is now referred for management of the malignant melanoma. He had a whole body PET scan on 01/31/2023 which revealed interval resolution of previously demonstrated hypermetabolic activity within the left foot, no evidence of  local recurrence of metastatic disease, resolution of right middle lobe airspace diseases seen on most recent CT. No suspicious pulmonary nodularity, new opacification of the maxillary sinus with mild peripheral hypermetabolic activity, likely inflammatory, and aortic atherosclerosis. He has a BRAF and PDL1 mutation.  Oncology History  Malignant melanoma, metastatic (HCC)  01/23/2022 Cancer Staging   Staging form: Melanoma of the Skin, AJCC 8th Edition - Clinical stage from 01/23/2022: Stage III (cT2b, cN1c, cM0) - Signed by [name], MD on 04/07/2022 Histopathologic type: Malignant melanoma, NOS (except juvenile melanoma M-8770/[id number]) Stage prefix: Initial diagnosis Laterality: Left Lymph-vascular invasion (LVI): LVI not present (absent)/not identified Diagnostic confirmation: Positive histology Specimen type: Biopsy / Limited Resection Staged by: Managing physician Mitotic count: 4 Mitotic unit: mm2 Clark's level: Level IV Tumor-infiltrating lymphocytes: Present and non-brisk Breslow depth (mm): 16 Ulceration of the epidermis: Yes Microsatellites: No Primary tumor regression: Absent Intransit/satellite metastasis: In-transit Lymph node clinically or radiologically detected: No Microscopic confirmation of metastasis: No Sentinel lymph node biopsy performed: No Completion  or therapeutic lymph node dissection performed: No Matted nodes: No Stage used in treatment planning: Yes National guidelines used in treatment planning: Yes Type of national guideline used in treatment planning: NCCN   01/31/2022 Initial Diagnosis   Malignant melanoma, metastatic (HCC)   04/14/2022 -  Chemotherapy   Patient is on Treatment Plan : MELANOMA Nivolumab (480) q28d (changed from q14d on 12/4)     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)  02/28/2022 Cancer Staging   Staging form: Melanoma of the Skin, AJCC 8th Edition - Clinical stage from 02/28/2022: Stage IV (cT2b(m), cN1c, cM1a) - Signed by  [name], MD on 04/07/2022 Histopathologic type: Malignant melanoma, NOS (except juvenile melanoma M-8770/[id number]) Stage prefix: Initial diagnosis Laterality: Left Multiple tumors: Yes Lymph-vascular invasion (LVI): LVI present/identified, NOS Diagnostic confirmation: Positive histology Specimen type: Excision Staged by: Managing physician Intransit/satellite metastasis: Both in-transit and satellite Prognostic indicators: 02/28/22:  Wide excision of primary of foot - no residual melanoma,  Excision of skin of calf consistent with intransit mets of dermal/subcuticular   Melanoma with LVI, metastatic disease with multifocal lesions and multifocal positive margins 03/24/22: Re-excision with a few atypical melanocytes, no melanoma and clear margins   04/07/2022 Initial Diagnosis   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 Patient started Nivolumab and Ipilimumab on 04/14/2022. He has a BRAF and PDL1 mutation. Patient states that he feels well and has no new complaints. He continues to have radicular pain of the left leg that he rates as a 6/10, radiating from his back.  He denies signs of infection such as sore throat, sinus drainage, cough, or urinary symptoms.  He denies fevers or recurrent chills. He denies pain. He denies nausea, vomiting, chest pain, dyspnea or cough. His appetite is good and his weight has been stable. He has now completed 1 year of immunotherapy. We once again discussed duration of therapy and I have recommended that we continue treatment since he had metastatic disease to the left leg and probably to the lung, all resolved now.   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 , 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    Past Surgical History:  Procedure Laterality Date   LUMBAR LAMINECTOMY/DECOMPRESSION MICRODISCECTOMY N/A 01/26/2022   Procedure: Microlumbar decompression Lumbar four-five, lateral mass fusion with autograft and allograft bone;  Surgeon: Jene Every, MD;  Location: [facility] OR;  Service: Orthopedics;  Laterality: N/A;   PARTIAL GASTRECTOMY     TOTAL HIP ARTHROPLASTY Right 06/18/2018   Procedure: RIGHT TOTAL HIP ARTHROPLASTY ANTERIOR APPROACH;  Surgeon: Durene Romans, MD;  Location: [facility] ORS;  Service: Orthopedics;  Laterality: Right;    Family History  Problem Relation Age of Onset   Lung cancer Sister    Melanoma Brother     Social History:  reports that he has quit smoking. He has never used smokeless tobacco. He reports that he does not currently use alcohol. He reports that he does not use drugs.    Allergies:  Allergies  Allergen Reactions   Morphine Itching    Patient states it is tolerable itching    Current Medications: Current Outpatient Medications  Medication Sig Dispense Refill   amLODipine (NORVASC) 5 MG tablet Take 5 mg by mouth daily.     Calcium Carbonate (CALCIUM 600 PO) Take 1 tablet by mouth 2 (two) times daily.     clopidogrel (PLAVIX) 75 MG tablet Take 1 tablet by mouth daily.     docusate sodium (COLACE) 100 MG capsule 1 capsule 2 TIMES DAILY (route: oral)     famotidine (PEPCID) 40 MG tablet Take 1 tablet (40 mg total) by mouth daily. 30 tablet 5   folic acid (FOLVITE) 400 MCG tablet Take 400 mcg by mouth daily.     gabapentin (NEURONTIN) 300 MG capsule Take 1 capsule (300 mg total) by mouth 3 (three) times daily. 270 capsule 3    hydrochlorothiazide (HYDRODIURIL) 12.5 MG tablet Take 12.5 mg by mouth daily.     lisinopril (ZESTRIL) 20 MG tablet      Multiple Vitamin (MULTIVITAMIN WITH MINERALS) TABS tablet Take 1 tablet by mouth in the morning.     mupirocin ointment (BACTROBAN) 2 % Apply topically.     NEOMYCIN-POLYMYXIN-HYDROCORTISONE (CORTISPORIN) 1 % SOLN OTIC solution      Omega-3 1000 MG CAPS Take by mouth.     ondansetron (ZOFRAN) 4 MG tablet Take 1 tablet (4 mg total) by mouth every 4 (four) hours as needed for nausea. 90 tablet 3   oxyCODONE (OXY IR/ROXICODONE) 5 MG immediate release tablet Take 1 tablet (5 mg total) by mouth every 6 (six) hours as needed for severe pain. 30 tablet 0   polyethylene glycol (MIRALAX / GLYCOLAX) 17 g packet Take 17 g by mouth daily. 14 each 0   pravastatin (PRAVACHOL) 40 MG tablet Take 40 mg by mouth daily.     prochlorperazine (COMPAZINE) 10 MG tablet Take 1 tablet (10 mg total) by mouth every 6 (six) hours as needed for nausea or vomiting. 90 tablet 3   traZODone (DESYREL) 50 MG tablet Take 1 tablet (50 mg total) by mouth at bedtime as needed for sleep. 30 tablet 5   No current facility-administered medications for this visit.    REVIEW OF SYSTEMS:  Review of Systems  Constitutional: Negative.  Negative for appetite change, chills, diaphoresis, fatigue, fever and unexpected weight change.  HENT:  Negative.  Negative for hearing loss, lump/mass, mouth sores, nosebleeds, sore throat, tinnitus, trouble swallowing and voice change.   Eyes: Negative.  Negative for eye problems and icterus.  Respiratory: Negative.  Negative for chest tightness, cough, hemoptysis, shortness of breath and wheezing.   Cardiovascular:  Positive for leg swelling (LLE and leg pain). Negative for chest pain and palpitations.  Gastrointestinal: Negative.  Negative for abdominal distention, abdominal pain, blood in stool, constipation, diarrhea, nausea, rectal pain and vomiting.  Endocrine: Negative.   Negative for hot flashes.  Genitourinary: Negative.  Negative for bladder incontinence, difficulty urinating, dyspareunia, dysuria, frequency, hematuria, nocturia, pelvic pain and penile discharge.   Musculoskeletal:  Positive for back pain (Chronic, improved). Negative for arthralgias, flank pain, gait problem, myalgias, neck pain and neck stiffness.  Skin: Negative.  Negative for itching, rash and wound.       Well healed scar below the  medial knee. Vitiligo patches on his upper and lower extremities.   Neurological: Negative.  Negative for dizziness, extremity weakness, gait problem, headaches, light-headedness, numbness, seizures and speech difficulty.  Hematological: Negative.  Negative for adenopathy. Does not bruise/bleed easily.  Psychiatric/Behavioral: Negative.  Negative for confusion, decreased concentration, depression, sleep disturbance and suicidal ideas. The patient is not nervous/anxious.      VITALS:  Blood pressure (!) 144/68, pulse (!) 59, temperature 97.9 F (36.6 C), temperature source Oral, resp. rate 18, height 5' 9.39" (1.763 m), weight 234 lb (106.1 kg), SpO2 97%.  Wt Readings from Last 3 Encounters:  04/30/23 236 lb 1.9 oz (107.1 kg)  04/26/23 234 lb (106.1 kg)  04/02/23 234 lb 0.6 oz (106.2 kg)    Body mass index is 34.17 kg/m.  Performance status (ECOG): 1 - Symptomatic but completely ambulatory  PHYSICAL EXAM:  Physical Exam Vitals and nursing note reviewed. Exam conducted with a chaperone present.  Constitutional:      General: He is not in acute distress.    Appearance: Normal appearance. He is normal weight. He is not ill-appearing, toxic-appearing or diaphoretic.  HENT:     Head: Normocephalic and atraumatic.     Right Ear: Tympanic membrane, ear canal and external ear normal. There is no impacted cerumen.     Left Ear: Tympanic membrane, ear canal and external ear normal. There is no impacted cerumen.     Nose: Nose normal. No congestion or  rhinorrhea.     Mouth/Throat:     Mouth: Mucous membranes are moist.     Pharynx: Oropharynx is clear. No oropharyngeal exudate or posterior oropharyngeal erythema.  Eyes:     General: No scleral icterus.       Right eye: No discharge.        Left eye: No discharge.     Extraocular Movements: Extraocular movements intact.     Conjunctiva/sclera: Conjunctivae normal.     Pupils: Pupils are equal, round, and reactive to light.  Neck:     Vascular: No carotid bruit.  Cardiovascular:     Rate and Rhythm: Normal rate and regular rhythm.     Pulses: Normal pulses.     Heart sounds: Normal heart sounds. No murmur heard.    No friction rub. No gallop.  Pulmonary:     Effort: Pulmonary effort is normal. No respiratory distress.     Breath sounds: Normal breath sounds. No stridor. No wheezing, rhonchi or rales.  Chest:     Chest wall: No tenderness.  Abdominal:     General: Bowel sounds are normal. There is no distension.     Palpations: Abdomen is soft. There is no hepatomegaly, splenomegaly or mass.     Tenderness: There is no abdominal tenderness. There is no right CVA tenderness, left CVA tenderness, guarding or rebound.     Hernia: No hernia is present.     Comments: Mid line abdominal scar that is well healed.   Musculoskeletal:        General: No swelling, tenderness, deformity or signs of injury. Normal range of motion.     Cervical back: Normal range of motion and neck supple. No rigidity or tenderness.     Right lower leg: No edema.     Left lower leg: 1+ Edema present.     Comments: Wound of the medial left knee is well healed with extensive scar tissue (unchanged)  Feet:     Comments: 2+ edema Healing scar on the dorsum of  the left foot.   Lymphadenopathy:     Cervical: No cervical adenopathy.     Upper Body:     Right upper body: No supraclavicular or axillary adenopathy.     Left upper body: No supraclavicular or axillary adenopathy.     Lower Body: No right inguinal  adenopathy. No left inguinal adenopathy.  Skin:    General: Skin is warm and dry.     Coloration: Skin is not jaundiced or pale.     Findings: No bruising, erythema, lesion or rash.     Comments: His skin has patches of vitiligo on both upper extrimities and left lower extrimity  Neurological:     General: No focal deficit present.     Mental Status: He is alert and oriented to person, place, and time. Mental status is at baseline.     Cranial Nerves: No cranial nerve deficit.     Sensory: No sensory deficit.     Motor: No weakness.     Coordination: Coordination normal.     Gait: Gait normal.     Deep Tendon Reflexes: Reflexes normal.  Psychiatric:        Mood and Affect: Mood normal.        Behavior: Behavior normal.        Thought Content: Thought content normal.        Judgment: Judgment normal.     LABS:      Latest Ref Rng & Units 04/26/2023    2:50 PM 03/29/2023    1:08 PM 03/01/2023    1:57 PM  CBC  WBC 4.0 - 10.5 K/uL 10.9  12.8  13.1   Hemoglobin 13.0 - 17.0 g/dL 16.1  09.6  04.5   Hematocrit 39.0 - 52.0 % 41.3  40.4  39.4   Platelets 150 - 400 K/uL 236  235  247       Latest Ref Rng & Units 04/26/2023    2:50 PM 03/29/2023    1:08 PM 03/01/2023    1:57 PM  CMP  Glucose 70 - 99 mg/dL 409  92  811   BUN 8 - 23 mg/dL 21  23  18    Creatinine 0.61 - 1.24 mg/dL 9.14  7.82  9.56   Sodium 135 - 145 mmol/L 137  139  135   Potassium 3.5 - 5.1 mmol/L 3.7  3.9  3.8   Chloride 98 - 111 mmol/L 102  105  107   CO2 22 - 32 mmol/L 25  25  22    Calcium 8.9 - 10.3 mg/dL 8.9  9.2  8.5   Total Protein 6.5 - 8.1 g/dL 7.3  7.4  7.2   Total Bilirubin 0.3 - 1.2 mg/dL 0.9  1.0  0.9   Alkaline Phos 38 - 126 U/L 70  80  78   AST 15 - 41 U/L 20  17  18    ALT 0 - 44 U/L 19  18  16     Component Ref Range & Units 03/29/2023 1 mo ago 2 mo ago 3 mo ago 4 mo ago 5 mo ago 6 mo ago  TSH 0.350 - 4.500 uIU/mL 1.189 1.157 CM 0.971 CM 1.052 CM 1.468 CM 1.490 CM 1.551 CM     Component Ref Range  & Units 03/29/2023 1 mo ago 2 mo ago 3 mo ago 4 mo ago 5 mo ago 6 mo ago  T4, Total 4.5 - 12.0 ug/dL 7.6 7.0 CM 7.2 CM 8.8 CM 8.3 CM 9.3  CM 8.1 CM      No results found for: "CEA1", "CEA" / No results found for: "CEA1", "CEA" No results found for: "PSA1" No results found for: "ZOX096" No results found for: "CAN125"  No results found for: "TOTALPROTELP", "ALBUMINELP", "A1GS", "A2GS", "BETS", "BETA2SER", "GAMS", "MSPIKE", "SPEI" No results found for: "TIBC", "FERRITIN", "IRONPCTSAT" Lab Results  Component Value Date   LDH 140 02/01/2023   LDH 160 02/07/2022    STUDIES:  Exam: 01/31/23 Nuclear Medicine Pet Whole Body IMPRESSION: Interval resolution of previously demonstrated hypermetabolic activity within the left foot. No evidence of local recurrence of metastatic disease. Resolution of right middle lobe airspace diseases seen on most recent CT. No suspicious pulmonary nodularity. New opacification of the maxillary sinus with mild peripheral hypermetabolic activity, likely inflammatory Aortic Atherosclerosis (ICD10-170.0)  Exam: 08/16/22 CT Chest, Abdomen, and Pelvis with contrast Chest Impressions: The 2 dominant nodules in the RIGHT lung previously documented are not readily identified; however, there is a new branching nodularity/consolidation in RIGHT middle lobe. RIGHT middle lobe findings favor infectious or inflammatory process. No metastatic mediastinal adenopathy identified No cutaneous lesion.  Abdomen / Pelvis Impression: No evidence of metastatic disease in the abdomen pelvis. Benign Bosniak 1 renal cysts. No follow-up recommended. No lymphadenopathy or peritoneal metastasis.      I,Jasmine M Lassiter,acting as a scribe for [name], MD.,have documented all relevant documentation on the behalf of [name], MD,as directed by  [name], MD while in the presence of [name], MD.  I have reviewed this report as typed by the  medical scribe, and it is complete and accurate.  [name]   05/22/23 7:07 AM

## 2023-04-26 ENCOUNTER — Encounter: Payer: Self-pay | Admitting: Oncology

## 2023-04-26 ENCOUNTER — Other Ambulatory Visit: Payer: Self-pay | Admitting: Oncology

## 2023-04-26 ENCOUNTER — Inpatient Hospital Stay: Payer: Medicare HMO | Attending: Oncology

## 2023-04-26 ENCOUNTER — Inpatient Hospital Stay: Payer: Medicare HMO | Admitting: Oncology

## 2023-04-26 VITALS — BP 144/68 | HR 59 | Temp 97.9°F | Resp 18 | Ht 69.39 in | Wt 234.0 lb

## 2023-04-26 DIAGNOSIS — R918 Other nonspecific abnormal finding of lung field: Secondary | ICD-10-CM | POA: Diagnosis not present

## 2023-04-26 DIAGNOSIS — Z23 Encounter for immunization: Secondary | ICD-10-CM | POA: Diagnosis not present

## 2023-04-26 DIAGNOSIS — Z5112 Encounter for antineoplastic immunotherapy: Secondary | ICD-10-CM | POA: Diagnosis not present

## 2023-04-26 DIAGNOSIS — C4372 Malignant melanoma of left lower limb, including hip: Secondary | ICD-10-CM | POA: Insufficient documentation

## 2023-04-26 DIAGNOSIS — C439 Malignant melanoma of skin, unspecified: Secondary | ICD-10-CM

## 2023-04-26 DIAGNOSIS — C792 Secondary malignant neoplasm of skin: Secondary | ICD-10-CM

## 2023-04-26 DIAGNOSIS — Z79899 Other long term (current) drug therapy: Secondary | ICD-10-CM | POA: Diagnosis not present

## 2023-04-26 LAB — CBC WITH DIFFERENTIAL (CANCER CENTER ONLY)
Abs Immature Granulocytes: 0.03 10*3/uL (ref 0.00–0.07)
Basophils Absolute: 0 10*3/uL (ref 0.0–0.1)
Basophils Relative: 0 %
Eosinophils Absolute: 0.5 10*3/uL (ref 0.0–0.5)
Eosinophils Relative: 5 %
HCT: 41.3 % (ref 39.0–52.0)
Hemoglobin: 13.9 g/dL (ref 13.0–17.0)
Immature Granulocytes: 0 %
Lymphocytes Relative: 27 %
Lymphs Abs: 2.9 10*3/uL (ref 0.7–4.0)
MCH: 29.5 pg (ref 26.0–34.0)
MCHC: 33.7 g/dL (ref 30.0–36.0)
MCV: 87.7 fL (ref 80.0–100.0)
Monocytes Absolute: 1 10*3/uL (ref 0.1–1.0)
Monocytes Relative: 9 %
Neutro Abs: 6.4 10*3/uL (ref 1.7–7.7)
Neutrophils Relative %: 59 %
Platelet Count: 236 10*3/uL (ref 150–400)
RBC: 4.71 MIL/uL (ref 4.22–5.81)
RDW: 13.1 % (ref 11.5–15.5)
WBC Count: 10.9 10*3/uL — ABNORMAL HIGH (ref 4.0–10.5)
nRBC: 0 % (ref 0.0–0.2)

## 2023-04-26 LAB — CMP (CANCER CENTER ONLY)
ALT: 19 U/L (ref 0–44)
AST: 20 U/L (ref 15–41)
Albumin: 4.3 g/dL (ref 3.5–5.0)
Alkaline Phosphatase: 70 U/L (ref 38–126)
Anion gap: 10 (ref 5–15)
BUN: 21 mg/dL (ref 8–23)
CO2: 25 mmol/L (ref 22–32)
Calcium: 8.9 mg/dL (ref 8.9–10.3)
Chloride: 102 mmol/L (ref 98–111)
Creatinine: 1.13 mg/dL (ref 0.61–1.24)
GFR, Estimated: >60 mL/min (ref >60)
Glucose, Bld: 104 mg/dL — ABNORMAL HIGH (ref 70–99)
Potassium: 3.7 mmol/L (ref 3.5–5.1)
Sodium: 137 mmol/L (ref 135–145)
Total Bilirubin: 0.9 mg/dL (ref 0.3–1.2)
Total Protein: 7.3 g/dL (ref 6.5–8.1)

## 2023-04-26 LAB — TSH: TSH: 1.511 u[IU]/mL (ref 0.350–4.500)

## 2023-04-27 ENCOUNTER — Other Ambulatory Visit: Payer: Self-pay | Admitting: Oncology

## 2023-04-27 DIAGNOSIS — C792 Secondary malignant neoplasm of skin: Secondary | ICD-10-CM

## 2023-04-27 DIAGNOSIS — C439 Malignant melanoma of skin, unspecified: Secondary | ICD-10-CM

## 2023-04-27 LAB — T4: T4, Total: 7.5 ug/dL (ref 4.5–12.0)

## 2023-04-27 MED FILL — Nivolumab IV Soln 240 MG/24ML: INTRAVENOUS | Qty: 48 | Status: AC

## 2023-04-28 ENCOUNTER — Other Ambulatory Visit: Payer: Self-pay

## 2023-04-30 ENCOUNTER — Inpatient Hospital Stay: Payer: Medicare HMO

## 2023-04-30 VITALS — BP 170/79 | HR 66 | Temp 98.4°F | Resp 14 | Ht 69.39 in | Wt 236.1 lb

## 2023-04-30 DIAGNOSIS — Z23 Encounter for immunization: Secondary | ICD-10-CM | POA: Diagnosis not present

## 2023-04-30 DIAGNOSIS — C792 Secondary malignant neoplasm of skin: Secondary | ICD-10-CM

## 2023-04-30 DIAGNOSIS — C439 Malignant melanoma of skin, unspecified: Secondary | ICD-10-CM

## 2023-04-30 DIAGNOSIS — C4372 Malignant melanoma of left lower limb, including hip: Secondary | ICD-10-CM | POA: Diagnosis not present

## 2023-04-30 DIAGNOSIS — Z5112 Encounter for antineoplastic immunotherapy: Secondary | ICD-10-CM | POA: Diagnosis not present

## 2023-04-30 DIAGNOSIS — Z79899 Other long term (current) drug therapy: Secondary | ICD-10-CM | POA: Diagnosis not present

## 2023-04-30 MED ORDER — HEPARIN SOD (PORK) LOCK FLUSH 100 UNIT/ML IV SOLN
500.0000 [IU] | Freq: Once | INTRAVENOUS | Status: AC | PRN
  Administered 2023-04-30: 500 [IU]

## 2023-04-30 MED ORDER — INFLUENZA VAC A&B SURF ANT ADJ 0.5 ML IM SUSY
0.5000 mL | PREFILLED_SYRINGE | Freq: Once | INTRAMUSCULAR | Status: AC
  Administered 2023-04-30: 0.5 mL via INTRAMUSCULAR
  Filled 2023-04-30: qty 0.5

## 2023-04-30 MED ORDER — SODIUM CHLORIDE 0.9% FLUSH
10.0000 mL | INTRAVENOUS | Status: DC | PRN
  Administered 2023-04-30: 10 mL

## 2023-04-30 MED ORDER — SODIUM CHLORIDE 0.9 % IV SOLN: Freq: Once | INTRAVENOUS | Status: AC

## 2023-04-30 MED ORDER — SODIUM CHLORIDE 0.9 % IV SOLN
480.0000 mg | Freq: Once | INTRAVENOUS | Status: AC
  Administered 2023-04-30: 480 mg via INTRAVENOUS
  Filled 2023-04-30: qty 48

## 2023-04-30 NOTE — Patient Instructions (Signed)
Nivolumab Injection What is this medication? NIVOLUMAB (nye VOL ue mab) treats some types of cancer. It works by helping your immune system slow or stop the spread of cancer cells. It is a monoclonal antibody. This medicine may be used for other purposes; ask your health care provider or pharmacist if you have questions. COMMON BRAND NAME(S): Opdivo What should I tell my care team before I take this medication? They need to know if you have any of these conditions: Allogeneic stem cell transplant (uses someone else's stem cells) Autoimmune diseases, such as Crohn disease, ulcerative colitis, lupus History of chest radiation Nervous system problems, such as Guillain-Barre syndrome or myasthenia gravis Organ transplant An unusual or allergic reaction to nivolumab, other medications, foods, dyes, or preservatives Pregnant or trying to get pregnant Breast-feeding How should I use this medication? This medication is infused into a vein. It is given in a hospital or clinic setting. A special MedGuide will be given to you before each treatment. Be sure to read this information carefully each time. Talk to your care team about the use of this medication in children. While it may be prescribed for children as young as 12 years for selected conditions, precautions do apply. Overdosage: If you think you have taken too much of this medicine contact a poison control center or emergency room at once. NOTE: This medicine is only for you. Do not share this medicine with others. What if I miss a dose? Keep appointments for follow-up doses. It is important not to miss your dose. Call your care team if you are unable to keep an appointment. What may interact with this medication? Interactions have not been studied. This list may not describe all possible interactions. Give your health care provider a list of all the medicines, herbs, non-prescription drugs, or dietary supplements you use. Also tell them if you  smoke, drink alcohol, or use illegal drugs. Some items may interact with your medicine. What should I watch for while using this medication? Your condition will be monitored carefully while you are receiving this medication. You may need blood work while taking this medication. This medication may cause serious skin reactions. They can happen weeks to months after starting the medication. Contact your care team right away if you notice fevers or flu-like symptoms with a rash. The rash may be red or purple and then turn into blisters or peeling of the skin. You may also notice a red rash with swelling of the face, lips, or lymph nodes in your neck or under your arms. Tell your care team right away if you have any change in your eyesight. Talk to your care team if you are pregnant or think you might be pregnant. A negative pregnancy test is required before starting this medication. A reliable form of contraception is recommended while taking this medication and for 5 months after the last dose. Talk to your care team about effective forms of contraception. Do not breast-feed while taking this medication and for 5 months after the last dose. What side effects may I notice from receiving this medication? Side effects that you should report to your care team as soon as possible: Allergic reactions--skin rash, itching, hives, swelling of the face, lips, tongue, or throat Dry cough, shortness of breath or trouble breathing Eye pain, redness, irritation, or discharge with blurry or decreased vision Heart muscle inflammation--unusual weakness or fatigue, shortness of breath, chest pain, fast or irregular heartbeat, dizziness, swelling of the ankles, feet, or hands Hormone   gland problems--headache, sensitivity to light, unusual weakness or fatigue, dizziness, fast or irregular heartbeat, increased sensitivity to cold or heat, excessive sweating, constipation, hair loss, increased thirst or amount of urine,  tremors or shaking, irritability Infusion reactions--chest pain, shortness of breath or trouble breathing, feeling faint or lightheaded Kidney injury (glomerulonephritis)--decrease in the amount of urine, red or dark brown urine, foamy or bubbly urine, swelling of the ankles, hands, or feet Liver injury--right upper belly pain, loss of appetite, nausea, light-colored stool, dark yellow or brown urine, yellowing skin or eyes, unusual weakness or fatigue Pain, tingling, or numbness in the hands or feet, muscle weakness, change in vision, confusion or trouble speaking, loss of balance or coordination, trouble walking, seizures Rash, fever, and swollen lymph nodes Redness, blistering, peeling, or loosening of the skin, including inside the mouth Sudden or severe stomach pain, bloody diarrhea, fever, nausea, vomiting Side effects that usually do not require medical attention (report these to your care team if they continue or are bothersome): Bone, joint, or muscle pain Diarrhea Fatigue Loss of appetite Nausea Skin rash This list may not describe all possible side effects. Call your doctor for medical advice about side effects. You may report side effects to FDA at 1-800-FDA-1088. Where should I keep my medication? This medication is given in a hospital or clinic. It will not be stored at home. NOTE: This sheet is a summary. It may not cover all possible information. If you have questions about this medicine, talk to your doctor, pharmacist, or health care provider.  2024 [employer] (2021-12-05 00:00:00)  

## 2023-05-22 ENCOUNTER — Encounter: Payer: Self-pay | Admitting: Oncology

## 2023-05-23 NOTE — Progress Notes (Signed)
[facility]  [address] [place],  [place]  [address] [phone]  Clinic Day: 05/24/23  Referring physician: [name], MD  ASSESSMENT & PLAN:  Assessment: Malignant melanoma of the left foot He had a punch biopsy and this reveals a Breslow thickness of 1.6 mm and Clark's level 4 with ulceration for a T2b N0 M0 clinically.  We found no distant metastasis on PET scan, but he did have metastasis to the left lower extremity.  His tumor is positive for BRAF and PDL1 mutation. He had this treated with wide local excision.   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   Multiple lesions of the lung These were suspicious for metastatic disease but have now resolved on the CT scan on 08/12/22.  The PET scan was negative but we cannot absolutely rule out metastatic disease since these lesions are small. We discussed the fact that the resolution of these lesions could represent a response to treatment, or may not have been malignant at all. I think it is more likely the former, and so I recommend continuation of therapy.  We have had several discussions about this.  Spinal stenosis at L4-5 He has had surgical decompression and resection of a synovial cyst as of January 26 2022.  Leukocytosis He has had chronic leukocytosis and no symptoms of infection and urinalysis and urine culture were negative.  We did not find an explanation but the [facility] continue to fluctuate up and down.  Lymphedema of the Left Lower Extremity We discussed this diagnosis and the limited approaches available. For now we will just monitor it.   Plan: During his physical exam I noticed that his right ear has Otitis Externa. I will prescribe cortisporin Otic. He will see an dermatologist this month and  establish regular screening. I also advised that they evaluate the irregular nevus on his right lower back. His day 1 cycle 17 of Nivolumab is scheduled on 05/28/2023. His labs today are pending. I will see him back in 4 weeks with CBC and CMP. The patient was provided an opportunity to ask questions and all were answered.  The patient agreed with the plan and demonstrated an understanding of the instructions.  The patient was advised to call back if the symptoms worsen or if the condition fails to improve as anticipated.  I provided 14  minutes of face-to-face time during this this encounter and > 50% was spent counseling as documented under my assessment and plan.   [name], MD [facility] [address] [place] [address] Dept: [phone] Dept Fax: [phone]   CHIEF COMPLAINT:  CC: Malignant melanoma  Current Treatment: Evaluation and immunotherapy   HISTORY OF PRESENT ILLNESS:  [name] is a 77 y.o. male with a history of malignant melanoma who is referred in consultation with Dr. Luna Kitchens for assessment and management.  He had a lesion of the dorsum of the left foot in September 2022 which appeared to be a granuloma and was treated in the office.  He later developed a another lesion lateral to this on the dorsum of the left foot which was not hyperpigmented but did bleed easily.  He also has several lesions of his left medial calf which are nodular  and span approximately 6 cm.  He has had some increased edema of the left lower leg.  He was evaluated by dermatology on June 5, who felt this could be a squamous cell carcinoma versus tinea or a drug reaction.  He had biopsies done of both the foot lesion and the Lesion.  Pathology came back that the left dorsal foot shave biopsy revealed a malignant melanoma possibly a primary nodular ulcerated melanoma which had a Breslow thickness of at least 1.6 mm and a  Clark's level 4.  Margins are positive as this was a shave biopsy.  Ulceration is present but no satellitosis.  The mitotic index is 4/mm and no lymphovascular invasion was identified.  No brisk tumor infiltrating lymphocytes are seen and tumor regression is absent.  This is felt to be at least a T2b lesion and wide excision was recommended.  The lesion of the left medial calf revealed a dermal map melanoma most consistent with metastatic disease.  This information was not available when the patient was admitted for back surgery on June 8 and he had a microlumbar decompression at L4-5 for spinal stenosis.  He was also found to have a synovial cyst at that time which was resected and benign.  He was readmitted several days later for acute pain of the right elbow and both arms.  Aspiration was obtained and this was diagnosed as gouty arthritis.  He did have temporary rehab after his back surgery.  His left leg was evaluated for deep venous thrombosis and was negative.  While he was recuperating from his surgery he had an x-ray which was abnormal which led to a CT scan which revealed at least 3 lung lesions.  MRI of the brain was negative and he is now referred for management of the malignant melanoma. He had a whole body PET scan on 01/31/2023 which revealed interval resolution of previously demonstrated hypermetabolic activity within the left foot, no evidence of local recurrence of metastatic disease, resolution of right middle lobe airspace diseases seen on most recent CT. No suspicious pulmonary nodularity, new opacification of the maxillary sinus with mild peripheral hypermetabolic activity, likely inflammatory, and aortic atherosclerosis. He has a BRAF and PDL1 mutation.  Oncology History  Malignant melanoma, metastatic (HCC)  01/23/2022 Cancer Staging   Staging form: Melanoma of the Skin, AJCC 8th Edition - Clinical stage from 01/23/2022: Stage III (cT2b, cN1c, cM0) - Signed by [name], MD on  04/07/2022 Histopathologic type: Malignant melanoma, NOS (except juvenile melanoma M-8770/[id number]) Stage prefix: Initial diagnosis Laterality: Left Lymph-vascular invasion (LVI): LVI not present (absent)/not identified Diagnostic confirmation: Positive histology Specimen type: Biopsy / Limited Resection Staged by: Managing physician Mitotic count: 4 Mitotic unit: mm2 Clark's level: Level IV Tumor-infiltrating lymphocytes: Present and non-brisk Breslow depth (mm): 16 Ulceration of the epidermis: Yes Microsatellites: No Primary tumor regression: Absent Intransit/satellite metastasis: In-transit Lymph node clinically or radiologically detected: No Microscopic confirmation of metastasis: No Sentinel lymph node biopsy performed: No Completion or therapeutic lymph node dissection performed: No Matted nodes: No Stage used in treatment planning: Yes National guidelines used in treatment planning: Yes Type of national guideline used in treatment planning: NCCN   01/31/2022 Initial Diagnosis   Malignant melanoma, metastatic (HCC)   04/14/2022 -  Chemotherapy   Patient is on Treatment Plan : MELANOMA Nivolumab (480) q28d (changed from q14d on 12/4)     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)  02/28/2022 Cancer Staging   Staging form: Melanoma of the Skin, AJCC  8th Edition - Clinical stage from 02/28/2022: Stage IV (cT2b(m), cN1c, cM1a) - Signed by [name], MD on 04/07/2022 Histopathologic type: Malignant melanoma, NOS (except juvenile melanoma M-8770/[id number]) Stage prefix: Initial diagnosis Laterality: Left Multiple tumors: Yes Lymph-vascular invasion (LVI): LVI present/identified, NOS Diagnostic confirmation: Positive histology Specimen type: Excision Staged by: Managing physician Intransit/satellite metastasis: Both in-transit and satellite Prognostic indicators: 02/28/22:  Wide excision of primary of foot - no residual melanoma,  Excision of skin of calf consistent with intransit  mets of dermal/subcuticular   Melanoma with LVI, metastatic disease with multifocal lesions and multifocal positive margins 03/24/22: Re-excision with a few atypical melanocytes, no melanoma and clear margins   04/07/2022 Initial Diagnosis   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 Patient started Nivolumab and Ipilimumab on 04/14/2022. He has a BRAF and PDL1 mutation. Patient states that he feels ok but complains of right ear pain. During his physical exam I noticed that his right ear has Otitis Externa. I will prescribe cortisporin Otic. He will see an dermatologist this month and establish regular screening. I also advised that they evaluate the irregular nevus on his right lower back. His day 1 cycle 17 of Nivolumab is scheduled on 05/28/2023. His labs today are pending. I will see him back in 4 weeks with CBC and CMP. He denies signs of infection such as sore throat, sinus drainage, cough, or urinary symptoms.  He denies fevers or recurrent chills. He denies pain. He denies nausea, vomiting, chest pain, dyspnea or cough. His appetite is good and his weight has decreased 8 pounds over last week .  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 , 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    Past Surgical History:  Procedure Laterality Date   LUMBAR LAMINECTOMY/DECOMPRESSION MICRODISCECTOMY N/A 01/26/2022   Procedure: Microlumbar  decompression Lumbar four-five, lateral mass fusion with autograft and allograft bone;  Surgeon: Jene Every, MD;  Location: [facility] OR;  Service: Orthopedics;  Laterality: N/A;   PARTIAL GASTRECTOMY     TOTAL HIP ARTHROPLASTY Right 06/18/2018   Procedure: RIGHT TOTAL HIP ARTHROPLASTY ANTERIOR APPROACH;  Surgeon: Durene Romans, MD;  Location: [facility] ORS;  Service: Orthopedics;  Laterality: Right;    Family History  Problem Relation Age of Onset   Lung cancer Sister    Melanoma Brother     Social History:  reports that he has quit smoking. He has never used smokeless tobacco. He reports that he does not currently use alcohol. He reports that he does not use drugs.    Allergies:  Allergies  Allergen Reactions   Morphine Itching    Patient states it is tolerable itching    Current Medications: Current Outpatient Medications  Medication Sig Dispense Refill   amLODipine (NORVASC) 5 MG tablet Take 5 mg  by mouth daily.     Calcium Carbonate (CALCIUM 600 PO) Take 1 tablet by mouth 2 (two) times daily.     clopidogrel (PLAVIX) 75 MG tablet Take 1 tablet by mouth daily.     docusate sodium (COLACE) 100 MG capsule 1 capsule 2 TIMES DAILY (route: oral)     famotidine (PEPCID) 40 MG tablet Take 1 tablet (40 mg total) by mouth daily. 30 tablet 5   folic acid (FOLVITE) 400 MCG tablet Take 400 mcg by mouth daily.     gabapentin (NEURONTIN) 300 MG capsule Take 1 capsule (300 mg total) by mouth 3 (three) times daily. 270 capsule 3   hydrochlorothiazide (HYDRODIURIL) 12.5 MG tablet Take 12.5 mg by mouth daily.     lisinopril (ZESTRIL) 20 MG tablet      Multiple Vitamin (MULTIVITAMIN WITH MINERALS) TABS tablet Take 1 tablet by mouth in the morning.     mupirocin ointment (BACTROBAN) 2 % Apply topically.     neomycin-polymyxin-hydrocortisone (CORTISPORIN) OTIC solution Place 3 drops into the right ear 4 (four) times daily. 10 mL 1   Omega-3 1000 MG CAPS Take by mouth.     ondansetron (ZOFRAN) 4 MG tablet  Take 1 tablet (4 mg total) by mouth every 4 (four) hours as needed for nausea. 90 tablet 3   oxyCODONE (OXY IR/ROXICODONE) 5 MG immediate release tablet Take 1 tablet (5 mg total) by mouth every 6 (six) hours as needed for severe pain. 30 tablet 0   polyethylene glycol (MIRALAX / GLYCOLAX) 17 g packet Take 17 g by mouth daily. 14 each 0   pravastatin (PRAVACHOL) 40 MG tablet Take 40 mg by mouth daily.     prochlorperazine (COMPAZINE) 10 MG tablet Take 1 tablet (10 mg total) by mouth every 6 (six) hours as needed for nausea or vomiting. 90 tablet 3   traZODone (DESYREL) 50 MG tablet Take 1 tablet (50 mg total) by mouth at bedtime as needed for sleep. 30 tablet 5   No current facility-administered medications for this visit.    REVIEW OF SYSTEMS:  Review of Systems  Constitutional: Negative.  Negative for appetite change, chills, diaphoresis, fatigue, fever and unexpected weight change.  HENT:  Negative.  Negative for hearing loss, lump/mass, mouth sores, nosebleeds, sore throat, tinnitus, trouble swallowing and voice change.        Right ear pain  Eyes: Negative.  Negative for eye problems and icterus.  Respiratory: Negative.  Negative for chest tightness, cough, hemoptysis, shortness of breath and wheezing.   Cardiovascular:  Positive for leg swelling (LLE and leg pain). Negative for chest pain and palpitations.  Gastrointestinal: Negative.  Negative for abdominal distention, abdominal pain, blood in stool, constipation, diarrhea, nausea, rectal pain and vomiting.  Endocrine: Negative.  Negative for hot flashes.  Genitourinary: Negative.  Negative for bladder incontinence, difficulty urinating, dyspareunia, dysuria, frequency, hematuria, nocturia, pelvic pain and penile discharge.   Musculoskeletal:  Positive for back pain (Chronic, improved). Negative for arthralgias, flank pain, gait problem, myalgias, neck pain and neck stiffness.  Skin: Negative.  Negative for itching, rash and wound.        Well healed scar below the medial knee. Vitiligo patches on his upper and lower extremities.   Neurological: Negative.  Negative for dizziness, extremity weakness, gait problem, headaches, light-headedness, numbness, seizures and speech difficulty.  Hematological: Negative.  Negative for adenopathy. Does not bruise/bleed easily.  Psychiatric/Behavioral: Negative.  Negative for confusion, decreased concentration, depression, sleep disturbance and suicidal ideas. The patient  is not nervous/anxious.      VITALS:  Blood pressure (!) 144/69, pulse 72, temperature 98.2 F (36.8 C), temperature source Oral, resp. rate 18, height 5' 9.39" (1.763 m), weight 228 lb 8 oz (103.6 kg), SpO2 98%.  Wt Readings from Last 3 Encounters:  05/28/23 232 lb 4 oz (105.3 kg)  05/24/23 228 lb 8 oz (103.6 kg)  04/30/23 236 lb 1.9 oz (107.1 kg)    Body mass index is 33.37 kg/m.  Performance status (ECOG): 1 - Symptomatic but completely ambulatory  PHYSICAL EXAM:  Physical Exam Vitals and nursing note reviewed. Exam conducted with a chaperone present.  Constitutional:      General: He is not in acute distress.    Appearance: Normal appearance. He is normal weight. He is not ill-appearing, toxic-appearing or diaphoretic.  HENT:     Head: Normocephalic and atraumatic.     Right Ear: Hearing normal. Tenderness present. There is no impacted cerumen. Tympanic membrane is erythematous.     Left Ear: Tympanic membrane, ear canal and external ear normal. There is no impacted cerumen.     Ears:     Comments: His right TM has erythema and white exudate and erythema of the right ear canal, and is tender    Nose: Nose normal. No congestion or rhinorrhea.     Mouth/Throat:     Mouth: Mucous membranes are moist.     Pharynx: Oropharynx is clear. No oropharyngeal exudate or posterior oropharyngeal erythema.  Eyes:     General: No scleral icterus.       Right eye: No discharge.        Left eye: No discharge.      Extraocular Movements: Extraocular movements intact.     Conjunctiva/sclera: Conjunctivae normal.     Pupils: Pupils are equal, round, and reactive to light.  Neck:     Vascular: No carotid bruit.  Cardiovascular:     Rate and Rhythm: Normal rate and regular rhythm.     Pulses: Normal pulses.     Heart sounds: Normal heart sounds. No murmur heard.    No friction rub. No gallop.  Pulmonary:     Effort: Pulmonary effort is normal. No respiratory distress.     Breath sounds: Normal breath sounds. No stridor. No wheezing, rhonchi or rales.  Chest:     Chest wall: No tenderness.  Abdominal:     General: Bowel sounds are normal. There is no distension.     Palpations: Abdomen is soft. There is no hepatomegaly, splenomegaly or mass.     Tenderness: There is no abdominal tenderness. There is no right CVA tenderness, left CVA tenderness, guarding or rebound.     Hernia: No hernia is present.     Comments: Mid line abdominal scar that is well healed.   Musculoskeletal:        General: No swelling, tenderness, deformity or signs of injury. Normal range of motion.     Cervical back: Normal range of motion and neck supple. No rigidity or tenderness.     Right lower leg: No edema.     Left lower leg: 1+ Edema present.     Comments: Extensive scarring on the medial left knee. He has vitiligo mainly effecting the left lower extremity and also the upper extremities.   Feet:     Comments: Healing scar on the dorsum of the left foot.   Lymphadenopathy:     Cervical: No cervical adenopathy.     Upper Body:  Right upper body: No supraclavicular or axillary adenopathy.     Left upper body: No supraclavicular or axillary adenopathy.     Lower Body: No right inguinal adenopathy. No left inguinal adenopathy.  Skin:    General: Skin is warm and dry.     Coloration: Skin is not jaundiced or pale.     Findings: No bruising, erythema, lesion or rash.     Comments: Irregular nevus of the right lower  back  Neurological:     General: No focal deficit present.     Mental Status: He is alert and oriented to person, place, and time. Mental status is at baseline.     Cranial Nerves: No cranial nerve deficit.     Sensory: No sensory deficit.     Motor: No weakness.     Coordination: Coordination normal.     Gait: Gait normal.     Deep Tendon Reflexes: Reflexes normal.  Psychiatric:        Mood and Affect: Mood normal.        Behavior: Behavior normal.        Thought Content: Thought content normal.        Judgment: Judgment normal.    LABS:      Latest Ref Rng & Units 05/24/2023    2:58 PM 04/26/2023    2:50 PM 03/29/2023    1:08 PM  CBC  WBC 4.0 - 10.5 K/uL 13.3  10.9  12.8   Hemoglobin 13.0 - 17.0 g/dL 78.2  95.6  21.3   Hematocrit 39.0 - 52.0 % 41.6  41.3  40.4   Platelets 150 - 400 K/uL 278  236  235       Latest Ref Rng & Units 05/24/2023    2:58 PM 04/26/2023    2:50 PM 03/29/2023    1:08 PM  CMP  Glucose 70 - 99 mg/dL 086  578  92   BUN 8 - 23 mg/dL 22  21  23    Creatinine 0.61 - 1.24 mg/dL 4.69  6.29  5.28   Sodium 135 - 145 mmol/L 140  137  139   Potassium 3.5 - 5.1 mmol/L 4.0  3.7  3.9   Chloride 98 - 111 mmol/L 104  102  105   CO2 22 - 32 mmol/L 25  25  25    Calcium 8.9 - 10.3 mg/dL 9.3  8.9  9.2   Total Protein 6.5 - 8.1 g/dL 7.5  7.3  7.4   Total Bilirubin 0.3 - 1.2 mg/dL 0.9  0.9  1.0   Alkaline Phos 38 - 126 U/L 68  70  80   AST 15 - 41 U/L 18  20  17    ALT 0 - 44 U/L 16  19  18     Component Ref Range & Units 04/26/2023 03/29/2023 2 mo ago 3 mo ago 4 mo ago 5 mo ago 6 mo ago  TSH 0.350 - 4.500 uIU/mL 1.511 1.189 CM 1.157 CM 0.971 CM 1.052 CM 1.468 CM 1.490 CM   Component Ref Range & Units 04/26/2023 03/29/2023 2 mo ago 3 mo ago 4 mo ago 5 mo ago 6 mo ago  T4, Total 4.5 - 12.0 ug/dL 7.5 7.6 CM 7.0 CM 7.2 CM 8.8 CM 8.3 CM 9.3 CM      No results found for: "CEA1", "CEA" / No results found for: "CEA1", "CEA" No results found for: "PSA1" No results found  for: "UXL244" No results found for: "WNU272"  No  results found for: "TOTALPROTELP", "ALBUMINELP", "A1GS", "A2GS", "BETS", "BETA2SER", "GAMS", "MSPIKE", "SPEI" No results found for: "TIBC", "FERRITIN", "IRONPCTSAT" Lab Results  Component Value Date   LDH 140 02/01/2023   LDH 160 02/07/2022    STUDIES:  Exam: 01/31/23 Nuclear Medicine Pet Whole Body IMPRESSION: Interval resolution of previously demonstrated hypermetabolic activity within the left foot. No evidence of local recurrence of metastatic disease. Resolution of right middle lobe airspace diseases seen on most recent CT. No suspicious pulmonary nodularity. New opacification of the maxillary sinus with mild peripheral hypermetabolic activity, likely inflammatory Aortic Atherosclerosis (ICD10-170.0)    I,Jasmine M Lassiter,acting as a scribe for [name], MD.,have documented all relevant documentation on the behalf of [name], MD,as directed by  [name], MD while in the presence of [name], MD.  I have reviewed this report as typed by the medical scribe, and it is complete and accurate.  [name]   06/10/23 10:04 AM

## 2023-05-24 ENCOUNTER — Inpatient Hospital Stay: Payer: Medicare HMO

## 2023-05-24 ENCOUNTER — Encounter: Payer: Self-pay | Admitting: Oncology

## 2023-05-24 ENCOUNTER — Other Ambulatory Visit: Payer: Self-pay | Admitting: Oncology

## 2023-05-24 ENCOUNTER — Inpatient Hospital Stay: Payer: Medicare HMO | Attending: Oncology | Admitting: Oncology

## 2023-05-24 DIAGNOSIS — C439 Malignant melanoma of skin, unspecified: Secondary | ICD-10-CM | POA: Diagnosis not present

## 2023-05-24 DIAGNOSIS — C792 Secondary malignant neoplasm of skin: Secondary | ICD-10-CM | POA: Diagnosis not present

## 2023-05-24 DIAGNOSIS — Z5112 Encounter for antineoplastic immunotherapy: Secondary | ICD-10-CM | POA: Insufficient documentation

## 2023-05-24 DIAGNOSIS — Z79899 Other long term (current) drug therapy: Secondary | ICD-10-CM | POA: Insufficient documentation

## 2023-05-24 DIAGNOSIS — C4372 Malignant melanoma of left lower limb, including hip: Secondary | ICD-10-CM | POA: Diagnosis not present

## 2023-05-24 DIAGNOSIS — H60501 Unspecified acute noninfective otitis externa, right ear: Secondary | ICD-10-CM

## 2023-05-24 LAB — CMP (CANCER CENTER ONLY)
ALT: 16 U/L (ref 0–44)
AST: 18 U/L (ref 15–41)
Albumin: 4.3 g/dL (ref 3.5–5.0)
Alkaline Phosphatase: 68 U/L (ref 38–126)
Anion gap: 11 (ref 5–15)
BUN: 22 mg/dL (ref 8–23)
CO2: 25 mmol/L (ref 22–32)
Calcium: 9.3 mg/dL (ref 8.9–10.3)
Chloride: 104 mmol/L (ref 98–111)
Creatinine: 1.17 mg/dL (ref 0.61–1.24)
GFR, Estimated: >60 mL/min (ref >60)
Glucose, Bld: 107 mg/dL — ABNORMAL HIGH (ref 70–99)
Potassium: 4 mmol/L (ref 3.5–5.1)
Sodium: 140 mmol/L (ref 135–145)
Total Bilirubin: 0.9 mg/dL (ref 0.3–1.2)
Total Protein: 7.5 g/dL (ref 6.5–8.1)

## 2023-05-24 LAB — CBC WITH DIFFERENTIAL (CANCER CENTER ONLY)
Abs Immature Granulocytes: 0.04 10*3/uL (ref 0.00–0.07)
Basophils Absolute: 0 10*3/uL (ref 0.0–0.1)
Basophils Relative: 0 %
Eosinophils Absolute: 0.4 10*3/uL (ref 0.0–0.5)
Eosinophils Relative: 3 %
HCT: 41.6 % (ref 39.0–52.0)
Hemoglobin: 13.9 g/dL (ref 13.0–17.0)
Immature Granulocytes: 0 %
Lymphocytes Relative: 21 %
Lymphs Abs: 2.7 10*3/uL (ref 0.7–4.0)
MCH: 29.3 pg (ref 26.0–34.0)
MCHC: 33.4 g/dL (ref 30.0–36.0)
MCV: 87.6 fL (ref 80.0–100.0)
Monocytes Absolute: 1.2 10*3/uL — ABNORMAL HIGH (ref 0.1–1.0)
Monocytes Relative: 9 %
Neutro Abs: 8.9 10*3/uL — ABNORMAL HIGH (ref 1.7–7.7)
Neutrophils Relative %: 67 %
Platelet Count: 278 10*3/uL (ref 150–400)
RBC: 4.75 MIL/uL (ref 4.22–5.81)
RDW: 13 % (ref 11.5–15.5)
WBC Count: 13.3 10*3/uL — ABNORMAL HIGH (ref 4.0–10.5)
nRBC: 0 % (ref 0.0–0.2)

## 2023-05-24 LAB — TSH: TSH: 0.844 u[IU]/mL (ref 0.350–4.500)

## 2023-05-24 MED ORDER — NEOMYCIN-POLYMYXIN-HC 3.5-10000-1 OT SOLN: 3.0000 [drp] | Freq: Four times a day (QID) | OTIC | 1 refills | Status: DC

## 2023-05-25 LAB — T4: T4, Total: 7.5 ug/dL (ref 4.5–12.0)

## 2023-05-25 MED FILL — Nivolumab IV Soln 240 MG/24ML: INTRAVENOUS | Qty: 48 | Status: AC

## 2023-05-28 ENCOUNTER — Inpatient Hospital Stay: Payer: Medicare HMO

## 2023-05-28 VITALS — BP 147/65 | HR 70 | Temp 97.6°F | Resp 18 | Ht 69.39 in | Wt 232.2 lb

## 2023-05-28 DIAGNOSIS — C4372 Malignant melanoma of left lower limb, including hip: Secondary | ICD-10-CM | POA: Diagnosis not present

## 2023-05-28 DIAGNOSIS — Z79899 Other long term (current) drug therapy: Secondary | ICD-10-CM | POA: Diagnosis not present

## 2023-05-28 DIAGNOSIS — C792 Secondary malignant neoplasm of skin: Secondary | ICD-10-CM

## 2023-05-28 DIAGNOSIS — Z5112 Encounter for antineoplastic immunotherapy: Secondary | ICD-10-CM | POA: Diagnosis not present

## 2023-05-28 DIAGNOSIS — C439 Malignant melanoma of skin, unspecified: Secondary | ICD-10-CM

## 2023-05-28 MED ORDER — SODIUM CHLORIDE 0.9% FLUSH
10.0000 mL | INTRAVENOUS | Status: DC | PRN
  Administered 2023-05-28: 10 mL

## 2023-05-28 MED ORDER — SODIUM CHLORIDE 0.9 % IV SOLN: Freq: Once | INTRAVENOUS | Status: AC

## 2023-05-28 MED ORDER — HEPARIN SOD (PORK) LOCK FLUSH 100 UNIT/ML IV SOLN
500.0000 [IU] | Freq: Once | INTRAVENOUS | Status: AC | PRN
  Administered 2023-05-28: 500 [IU]

## 2023-05-28 MED ORDER — SODIUM CHLORIDE 0.9 % IV SOLN
480.0000 mg | Freq: Once | INTRAVENOUS | Status: AC
  Administered 2023-05-28: 480 mg via INTRAVENOUS
  Filled 2023-05-28: qty 48

## 2023-05-28 NOTE — Patient Instructions (Signed)
[facility] [place]  Discharge Instructions: Thank you for choosing [facility] to provide your oncology and hematology care.  If you have a lab appointment with the Cancer Center, please go directly to the Cancer Center and check in at the registration area.   Wear comfortable clothing and clothing appropriate for easy access to any Portacath or PICC line.   We strive to give you quality time with your provider. You may need to reschedule your appointment if you arrive late (15 or more minutes).  Arriving late affects you and other patients whose appointments are after yours.  Also, if you miss three or more appointments without notifying the office, you may be dismissed from the clinic at the provider's discretion.      For prescription refill requests, have your pharmacy contact our office and allow 72 hours for refills to be completed.    Today you received the following chemotherapy and/or immunotherapy agents Opdivo      To help prevent nausea and vomiting after your treatment, we encourage you to take your nausea medication as directed.  BELOW ARE SYMPTOMS THAT SHOULD BE REPORTED IMMEDIATELY: *FEVER GREATER THAN 100.4 F (38 C) OR HIGHER *CHILLS OR SWEATING *NAUSEA AND VOMITING THAT IS NOT CONTROLLED WITH YOUR NAUSEA MEDICATION *UNUSUAL SHORTNESS OF BREATH *UNUSUAL BRUISING OR BLEEDING *URINARY PROBLEMS (pain or burning when urinating, or frequent urination) *BOWEL PROBLEMS (unusual diarrhea, constipation, pain near the anus) TENDERNESS IN MOUTH AND THROAT WITH OR WITHOUT PRESENCE OF ULCERS (sore throat, sores in mouth, or a toothache) UNUSUAL RASH, SWELLING OR PAIN  UNUSUAL VAGINAL DISCHARGE OR ITCHING   Items with * indicate a potential emergency and should be followed up as soon as possible or go to the Emergency Department if any problems should occur.  Please show the CHEMOTHERAPY ALERT CARD or IMMUNOTHERAPY ALERT CARD at check-in to the  Emergency Department and triage nurse.  Should you have questions after your visit or need to cancel or reschedule your appointment, please contact [facility] [place]  Dept: [phone]  and follow the prompts.  Office hours are 8:00 a.m. to 4:30 p.m. Monday - Friday. Please note that voicemails left after 4:00 p.m. may not be returned until the following business day.  We are closed weekends and major holidays. You have access to a nurse at all times for urgent questions. Please call the main number to the clinic Dept: [phone] and follow the prompts.  For any non-urgent questions, you may also contact your provider using MyChart. We now offer e-Visits for anyone 18 and older to request care online for non-urgent symptoms. For details visit [email or web address].   Also download the MyChart app! Go to the app store, search "MyChart", open the app, select [facility], and log in with your MyChart username and password.   

## 2023-06-04 DIAGNOSIS — L570 Actinic keratosis: Secondary | ICD-10-CM | POA: Diagnosis not present

## 2023-06-04 DIAGNOSIS — L814 Other melanin hyperpigmentation: Secondary | ICD-10-CM | POA: Diagnosis not present

## 2023-06-04 DIAGNOSIS — Z85820 Personal history of malignant melanoma of skin: Secondary | ICD-10-CM | POA: Diagnosis not present

## 2023-06-04 DIAGNOSIS — D225 Melanocytic nevi of trunk: Secondary | ICD-10-CM | POA: Diagnosis not present

## 2023-06-04 DIAGNOSIS — L300 Nummular dermatitis: Secondary | ICD-10-CM | POA: Diagnosis not present

## 2023-06-10 ENCOUNTER — Encounter: Payer: Self-pay | Admitting: Oncology

## 2023-06-21 ENCOUNTER — Encounter: Payer: Self-pay | Admitting: Oncology

## 2023-06-21 ENCOUNTER — Inpatient Hospital Stay: Payer: Medicare HMO | Admitting: Oncology

## 2023-06-21 ENCOUNTER — Inpatient Hospital Stay: Payer: Medicare HMO

## 2023-06-21 VITALS — BP 142/68 | HR 66 | Temp 98.6°F | Resp 18 | Ht 69.39 in | Wt 228.1 lb

## 2023-06-21 DIAGNOSIS — C439 Malignant melanoma of skin, unspecified: Secondary | ICD-10-CM

## 2023-06-21 DIAGNOSIS — Z79899 Other long term (current) drug therapy: Secondary | ICD-10-CM | POA: Diagnosis not present

## 2023-06-21 DIAGNOSIS — C4372 Malignant melanoma of left lower limb, including hip: Secondary | ICD-10-CM | POA: Diagnosis not present

## 2023-06-21 DIAGNOSIS — C792 Secondary malignant neoplasm of skin: Secondary | ICD-10-CM

## 2023-06-21 DIAGNOSIS — Z5112 Encounter for antineoplastic immunotherapy: Secondary | ICD-10-CM | POA: Diagnosis not present

## 2023-06-21 LAB — CBC WITH DIFFERENTIAL (CANCER CENTER ONLY)
Abs Immature Granulocytes: 0.05 10*3/uL (ref 0.00–0.07)
Basophils Absolute: 0 10*3/uL (ref 0.0–0.1)
Basophils Relative: 0 %
Eosinophils Absolute: 0.3 10*3/uL (ref 0.0–0.5)
Eosinophils Relative: 3 %
HCT: 42.9 % (ref 39.0–52.0)
Hemoglobin: 14.4 g/dL (ref 13.0–17.0)
Immature Granulocytes: 0 %
Lymphocytes Relative: 22 %
Lymphs Abs: 2.7 10*3/uL (ref 0.7–4.0)
MCH: 29.1 pg (ref 26.0–34.0)
MCHC: 33.6 g/dL (ref 30.0–36.0)
MCV: 86.8 fL (ref 80.0–100.0)
Monocytes Absolute: 1 10*3/uL (ref 0.1–1.0)
Monocytes Relative: 8 %
Neutro Abs: 7.9 10*3/uL — ABNORMAL HIGH (ref 1.7–7.7)
Neutrophils Relative %: 67 %
Platelet Count: 226 10*3/uL (ref 150–400)
RBC: 4.94 MIL/uL (ref 4.22–5.81)
RDW: 13.2 % (ref 11.5–15.5)
WBC Count: 11.9 10*3/uL — ABNORMAL HIGH (ref 4.0–10.5)
nRBC: 0 % (ref 0.0–0.2)

## 2023-06-21 LAB — CMP (CANCER CENTER ONLY)
ALT: 20 U/L (ref 0–44)
AST: 21 U/L (ref 15–41)
Albumin: 4.4 g/dL (ref 3.5–5.0)
Alkaline Phosphatase: 63 U/L (ref 38–126)
Anion gap: 13 (ref 5–15)
BUN: 27 mg/dL — ABNORMAL HIGH (ref 8–23)
CO2: 23 mmol/L (ref 22–32)
Calcium: 9.1 mg/dL (ref 8.9–10.3)
Chloride: 104 mmol/L (ref 98–111)
Creatinine: 1.15 mg/dL (ref 0.61–1.24)
GFR, Estimated: >60 mL/min (ref >60)
Glucose, Bld: 98 mg/dL (ref 70–99)
Potassium: 3.9 mmol/L (ref 3.5–5.1)
Sodium: 140 mmol/L (ref 135–145)
Total Bilirubin: 1.2 mg/dL (ref 0.3–1.2)
Total Protein: 7.2 g/dL (ref 6.5–8.1)

## 2023-06-21 LAB — TSH: TSH: 0.855 u[IU]/mL (ref 0.350–4.500)

## 2023-06-21 NOTE — Progress Notes (Signed)
[facility]  [address] [place],  [place]  [address] [phone]  Clinic Day: 06/21/2023  Referring physician: [name], MD  ASSESSMENT & PLAN:  Assessment: Malignant melanoma of the left foot He had a punch biopsy and this reveals a Breslow thickness of 1.6 mm and Clark's level 4 with ulceration for a T2b N0 M0 clinically.  We found no distant metastasis on PET scan, but he did have metastasis to the left lower extremity.  His tumor is positive for BRAF and PDL1 mutation. He had this treated with wide local excision.   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   Multiple lesions of the lung These were suspicious for metastatic disease but have now resolved on the CT scan on 08/12/22.  The PET scan was negative but we cannot absolutely rule out metastatic disease since these lesions are small. We discussed the fact that the resolution of these lesions could represent a response to treatment, or may not have been malignant at all. I think it is more likely the former, and so I recommend continuation of therapy.  We have had several discussions about this.  Spinal stenosis at L4-5 He has had surgical decompression and resection of a synovial cyst as of January 26 2022.  Leukocytosis He has had chronic leukocytosis and no symptoms of infection and urinalysis and urine culture were negative.  We did not find an explanation but the [facility] continue to fluctuate up and down.  Lymphedema of the Left Lower Extremity We discussed this diagnosis and the limited approaches available. For now we will just monitor it.   Plan: He informed me since last office visit since using Cortisporin Otic his ear pain has resolved. His day 1 cycle 18 of Nivolumab is scheduled on 06/25/2023.  He  does have vitiligo, mainly involving the left lower extremity.  His labs today are pending. I will see him back in 4 weeks with CBC, CMP, TSH, and T4. The patient was provided an opportunity to ask questions and all were answered.  The patient agreed with the plan and demonstrated an understanding of the instructions.  The patient was advised to call back if the symptoms worsen or if the condition fails to improve as anticipated.  I provided 13  minutes of face-to-face time during this this encounter and > 50% was spent counseling as documented under my assessment and plan.   [name], MD [facility] [address] [place] [address] Dept: [phone] Dept Fax: [phone]   CHIEF COMPLAINT:  CC: Malignant melanoma  Current Treatment: Evaluation and immunotherapy   HISTORY OF PRESENT ILLNESS:  [name] is a 77 y.o. male with a history of malignant melanoma who is referred in consultation with Dr. Luna Kitchens for assessment and management.  He had a lesion of the dorsum of the left foot in September 2022 which appeared to be a granuloma and was treated in the office.  He later developed a another lesion lateral to this on the dorsum of the left foot which was not hyperpigmented but did bleed easily.  He also has several lesions of his left medial calf which are nodular and span approximately 6 cm.  He has had some increased edema of  the left lower leg.  He was evaluated by dermatology on June 5, who felt this could be a squamous cell carcinoma versus tinea or a drug reaction.  He had biopsies done of both the foot lesion and the Lesion.  Pathology came back that the left dorsal foot shave biopsy revealed a malignant melanoma possibly a primary nodular ulcerated melanoma which had a Breslow thickness of at least 1.6 mm and a Clark's level 4.  Margins are positive as this was a shave biopsy.  Ulceration is present but no  satellitosis.  The mitotic index is 4/mm and no lymphovascular invasion was identified.  No brisk tumor infiltrating lymphocytes are seen and tumor regression is absent.  This is felt to be at least a T2b lesion and wide excision was recommended.  The lesion of the left medial calf revealed a dermal map melanoma most consistent with metastatic disease.  This information was not available when the patient was admitted for back surgery on June 8 and he had a microlumbar decompression at L4-5 for spinal stenosis.  He was also found to have a synovial cyst at that time which was resected and benign.  He was readmitted several days later for acute pain of the right elbow and both arms.  Aspiration was obtained and this was diagnosed as gouty arthritis.  He did have temporary rehab after his back surgery.  His left leg was evaluated for deep venous thrombosis and was negative.  While he was recuperating from his surgery he had an x-ray which was abnormal which led to a CT scan which revealed at least 3 lung lesions.  MRI of the brain was negative and he is now referred for management of the malignant melanoma. He had a whole body PET scan on 01/31/2023 which revealed interval resolution of previously demonstrated hypermetabolic activity within the left foot, no evidence of local recurrence of metastatic disease, resolution of right middle lobe airspace diseases seen on most recent CT. No suspicious pulmonary nodularity, new opacification of the maxillary sinus with mild peripheral hypermetabolic activity, likely inflammatory, and aortic atherosclerosis. He has a BRAF and PDL1 mutation.  Oncology History  Malignant melanoma, metastatic (HCC)  01/23/2022 Cancer Staging   Staging form: Melanoma of the Skin, AJCC 8th Edition - Clinical stage from 01/23/2022: Stage III (cT2b, cN1c, cM0) - Signed by [name], MD on 04/07/2022 Histopathologic type: Malignant melanoma, NOS (except juvenile melanoma  M-[id number]) Stage prefix: Initial diagnosis Laterality: Left Lymph-vascular invasion (LVI): LVI not present (absent)/not identified Diagnostic confirmation: Positive histology Specimen type: Biopsy / Limited Resection Staged by: Managing physician Mitotic count: 4 Mitotic unit: mm2 Clark's level: Level IV Tumor-infiltrating lymphocytes: Present and non-brisk Breslow depth (mm): 16 Ulceration of the epidermis: Yes Microsatellites: No Primary tumor regression: Absent Intransit/satellite metastasis: In-transit Lymph node clinically or radiologically detected: No Microscopic confirmation of metastasis: No Sentinel lymph node biopsy performed: No Completion or therapeutic lymph node dissection performed: No Matted nodes: No Stage used in treatment planning: Yes National guidelines used in treatment planning: Yes Type of national guideline used in treatment planning: NCCN   01/31/2022 Initial Diagnosis   Malignant melanoma, metastatic (HCC)   04/14/2022 -  Chemotherapy   Patient is on Treatment Plan : MELANOMA Nivolumab (480) q28d (changed from q14d on 12/4)     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)  02/28/2022 Cancer Staging   Staging form: Melanoma of the Skin, AJCC 8th Edition - Clinical stage from 02/28/2022: Stage IV (cT2b(m), cN1c, cM1a) -  Signed by [name], MD on 04/07/2022 Histopathologic type: Malignant melanoma, NOS (except juvenile melanoma M-[id number]) Stage prefix: Initial diagnosis Laterality: Left Multiple tumors: Yes Lymph-vascular invasion (LVI): LVI present/identified, NOS Diagnostic confirmation: Positive histology Specimen type: Excision Staged by: Managing physician Intransit/satellite metastasis: Both in-transit and satellite Prognostic indicators: 02/28/22:  Wide excision of primary of foot - no residual melanoma,  Excision of skin of calf consistent with intransit mets of dermal/subcuticular   Melanoma with LVI, metastatic disease with  multifocal lesions and multifocal positive margins 03/24/22: Re-excision with a few atypical melanocytes, no melanoma and clear margins   04/07/2022 Initial Diagnosis   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 Patient started Nivolumab and Ipilimumab on 04/14/2022. He has a BRAF and PDL1 mutation. Patient states that he feels well and denies pain. He informed me since last office visit since using Cortisporin Otic his ear pain has resolved.  He does have vitiligo, mainly involving his left lower extremity.  His day 1 cycle 18 of Nivolumab is scheduled on 06/25/2023. His labs today are pending. I will see him back in 4 weeks with CBC, CMP, TSH, and T4.   He denies signs of infection such as sore throat, sinus drainage, cough, or urinary symptoms.  He denies fevers or recurrent chills. He denies pain. He denies nausea, vomiting, chest pain, dyspnea or cough. His appetite is good and his weight has been stable.  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 , 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    Past Surgical History:  Procedure Laterality Date   LUMBAR LAMINECTOMY/DECOMPRESSION MICRODISCECTOMY N/A 01/26/2022   Procedure: Microlumbar decompression Lumbar four-five, lateral mass fusion with autograft and allograft bone;  Surgeon: Jene Every, MD;  Location: [facility] OR;  Service: Orthopedics;  Laterality: N/A;   PARTIAL GASTRECTOMY      TOTAL HIP ARTHROPLASTY Right 06/18/2018   Procedure: RIGHT TOTAL HIP ARTHROPLASTY ANTERIOR APPROACH;  Surgeon: Durene Romans, MD;  Location: [facility] ORS;  Service: Orthopedics;  Laterality: Right;    Family History  Problem Relation Age of Onset   Lung cancer Sister    Melanoma Brother     Social History:  reports that he has quit smoking. He has never used smokeless tobacco. He reports that he does not currently use alcohol. He reports that he does not use drugs.    Allergies:  Allergies  Allergen Reactions   Morphine Itching    Patient states it is tolerable itching    Current Medications: Current Outpatient Medications  Medication Sig Dispense Refill   triamcinolone cream (KENALOG) 0.1 % Apply topically 2 (two) times daily.     amLODipine (NORVASC) 5 MG tablet Take 5 mg by mouth daily.     Calcium Carbonate (CALCIUM 600 PO) Take 1 tablet by mouth  2 (two) times daily.     clopidogrel (PLAVIX) 75 MG tablet Take 1 tablet by mouth daily.     docusate sodium (COLACE) 100 MG capsule 1 capsule 2 TIMES DAILY (route: oral)     famotidine (PEPCID) 40 MG tablet TAKE 1 TABLET EVERY DAY 90 tablet 3   folic acid (FOLVITE) 400 MCG tablet Take 400 mcg by mouth daily.     gabapentin (NEURONTIN) 300 MG capsule Take 1 capsule (300 mg total) by mouth 3 (three) times daily. 270 capsule 3   hydrochlorothiazide (HYDRODIURIL) 12.5 MG tablet Take 12.5 mg by mouth daily.     lisinopril (ZESTRIL) 20 MG tablet      Multiple Vitamin (MULTIVITAMIN WITH MINERALS) TABS tablet Take 1 tablet by mouth in the morning.     mupirocin ointment (BACTROBAN) 2 % Apply topically.     neomycin-polymyxin-hydrocortisone (CORTISPORIN) OTIC solution Place 3 drops into the right ear 4 (four) times daily. 10 mL 1   Omega-3 1000 MG CAPS Take by mouth.     ondansetron (ZOFRAN) 4 MG tablet Take 1 tablet (4 mg total) by mouth every 4 (four) hours as needed for nausea. 90 tablet 3   oxyCODONE (OXY IR/ROXICODONE) 5 MG immediate release  tablet Take 1 tablet (5 mg total) by mouth every 6 (six) hours as needed for severe pain. 30 tablet 0   polyethylene glycol (MIRALAX / GLYCOLAX) 17 g packet Take 17 g by mouth daily. 14 each 0   pravastatin (PRAVACHOL) 40 MG tablet Take 40 mg by mouth daily.     prochlorperazine (COMPAZINE) 10 MG tablet Take 1 tablet (10 mg total) by mouth every 6 (six) hours as needed for nausea or vomiting. 90 tablet 3   traZODone (DESYREL) 50 MG tablet TAKE 1 TABLET AT BEDTIME AS NEEDED FOR SLEEP 30 tablet 11   No current facility-administered medications for this visit.    REVIEW OF SYSTEMS:  Review of Systems  Constitutional: Negative.  Negative for appetite change, chills, diaphoresis, fatigue, fever and unexpected weight change.  HENT:  Negative.  Negative for hearing loss, lump/mass, mouth sores, nosebleeds, sore throat, tinnitus, trouble swallowing and voice change.   Eyes: Negative.  Negative for eye problems and icterus.  Respiratory: Negative.  Negative for chest tightness, cough, hemoptysis, shortness of breath and wheezing.   Cardiovascular:  Positive for leg swelling (LLE and leg pain). Negative for chest pain and palpitations.  Gastrointestinal: Negative.  Negative for abdominal distention, abdominal pain, blood in stool, constipation, diarrhea, nausea, rectal pain and vomiting.  Endocrine: Negative.  Negative for hot flashes.  Genitourinary: Negative.  Negative for bladder incontinence, difficulty urinating, dyspareunia, dysuria, frequency, hematuria, nocturia, pelvic pain and penile discharge.   Musculoskeletal:  Positive for back pain (Chronic, improved). Negative for arthralgias, flank pain, gait problem, myalgias, neck pain and neck stiffness.  Skin: Negative.  Negative for itching, rash and wound.       Well healed scar below the medial knee. Vitiligo patches on his upper and lower extremities.   Neurological: Negative.  Negative for dizziness, extremity weakness, gait problem, headaches,  light-headedness, numbness, seizures and speech difficulty.  Hematological: Negative.  Negative for adenopathy. Does not bruise/bleed easily.  Psychiatric/Behavioral: Negative.  Negative for confusion, decreased concentration, depression, sleep disturbance and suicidal ideas. The patient is not nervous/anxious.      VITALS:  Blood pressure (!) 142/68, pulse 66, temperature 98.6 F (37 C), temperature source Oral, resp. rate 18, height 5' 9.39" (1.763 m), weight 228 lb  1.6 oz (103.5 kg), SpO2 97%.  Wt Readings from Last 3 Encounters:  06/25/23 230 lb 1.3 oz (104.4 kg)  06/21/23 228 lb 1.6 oz (103.5 kg)  05/28/23 232 lb 4 oz (105.3 kg)    Body mass index is 33.31 kg/m.  Performance status (ECOG): 1 - Symptomatic but completely ambulatory  PHYSICAL EXAM:  Physical Exam Vitals and nursing note reviewed. Exam conducted with a chaperone present.  Constitutional:      General: He is not in acute distress.    Appearance: Normal appearance. He is normal weight. He is not ill-appearing, toxic-appearing or diaphoretic.  HENT:     Head: Normocephalic and atraumatic.     Right Ear: Hearing, ear canal and external ear normal. No tenderness. There is no impacted cerumen. Tympanic membrane is not erythematous.     Left Ear: Tympanic membrane, ear canal and external ear normal. There is no impacted cerumen.     Ears:     Comments: His right TM is still dull and healing.     Nose: Nose normal. No congestion or rhinorrhea.     Mouth/Throat:     Mouth: Mucous membranes are moist.     Pharynx: Oropharynx is clear. No oropharyngeal exudate or posterior oropharyngeal erythema.  Eyes:     General: No scleral icterus.       Right eye: No discharge.        Left eye: No discharge.     Extraocular Movements: Extraocular movements intact.     Conjunctiva/sclera: Conjunctivae normal.     Pupils: Pupils are equal, round, and reactive to light.  Neck:     Vascular: No carotid bruit.  Cardiovascular:      Rate and Rhythm: Normal rate and regular rhythm.     Pulses: Normal pulses.     Heart sounds: Normal heart sounds. No murmur heard.    No friction rub. No gallop.  Pulmonary:     Effort: Pulmonary effort is normal. No respiratory distress.     Breath sounds: Normal breath sounds. No stridor. No wheezing, rhonchi or rales.  Chest:     Chest wall: No tenderness.  Abdominal:     General: Bowel sounds are normal. There is no distension.     Palpations: Abdomen is soft. There is no hepatomegaly, splenomegaly or mass.     Tenderness: There is no abdominal tenderness. There is no right CVA tenderness, left CVA tenderness, guarding or rebound.     Hernia: No hernia is present.     Comments: Mid line abdominal scar that is well healed.   Musculoskeletal:        General: No swelling, tenderness, deformity or signs of injury. Normal range of motion.     Cervical back: Normal range of motion and neck supple. No rigidity or tenderness.     Right lower leg: No edema.     Left lower leg: 1+ Edema present.     Comments: Extensive scarring on the medial left knee. He has vitiligo mainly effecting the left lower extremity and also the upper extremities.   Feet:     Comments: Healing scar on the dorsum of the left foot.   Lymphadenopathy:     Cervical: No cervical adenopathy.     Upper Body:     Right upper body: No supraclavicular or axillary adenopathy.     Left upper body: No supraclavicular or axillary adenopathy.     Lower Body: No right inguinal adenopathy. No left inguinal adenopathy.  Skin:    General: Skin is warm and dry.     Coloration: Skin is not jaundiced or pale.     Findings: No bruising, erythema, lesion or rash.     Comments: Irregular nevus of the right lower back which appear benign   Neurological:     General: No focal deficit present.     Mental Status: He is alert and oriented to person, place, and time. Mental status is at baseline.     Cranial Nerves: No cranial nerve  deficit.     Sensory: No sensory deficit.     Motor: No weakness.     Coordination: Coordination normal.     Gait: Gait normal.     Deep Tendon Reflexes: Reflexes normal.  Psychiatric:        Mood and Affect: Mood normal.        Behavior: Behavior normal.        Thought Content: Thought content normal.        Judgment: Judgment normal.    LABS:      Latest Ref Rng & Units 06/21/2023    3:01 PM 05/24/2023    2:58 PM 04/26/2023    2:50 PM  CBC  WBC 4.0 - 10.5 K/uL 11.9  13.3  10.9   Hemoglobin 13.0 - 17.0 g/dL 16.1  09.6  04.5   Hematocrit 39.0 - 52.0 % 42.9  41.6  41.3   Platelets 150 - 400 K/uL 226  278  236       Latest Ref Rng & Units 06/21/2023    3:01 PM 05/24/2023    2:58 PM 04/26/2023    2:50 PM  CMP  Glucose 70 - 99 mg/dL 98  409  811   BUN 8 - 23 mg/dL 27  22  21    Creatinine 0.61 - 1.24 mg/dL 9.14  7.82  9.56   Sodium 135 - 145 mmol/L 140  140  137   Potassium 3.5 - 5.1 mmol/L 3.9  4.0  3.7   Chloride 98 - 111 mmol/L 104  104  102   CO2 22 - 32 mmol/L 23  25  25    Calcium 8.9 - 10.3 mg/dL 9.1  9.3  8.9   Total Protein 6.5 - 8.1 g/dL 7.2  7.5  7.3   Total Bilirubin 0.3 - 1.2 mg/dL 1.2  0.9  0.9   Alkaline Phos 38 - 126 U/L 63  68  70   AST 15 - 41 U/L 21  18  20    ALT 0 - 44 U/L 20  16  19     Component Ref Range & Units 04/26/2023 03/29/2023 2 mo ago 3 mo ago 4 mo ago 5 mo ago 6 mo ago  TSH 0.350 - 4.500 uIU/mL 1.511 1.189 CM 1.157 CM 0.971 CM 1.052 CM 1.468 CM 1.490 CM   Component Ref Range & Units 04/26/2023 03/29/2023 2 mo ago 3 mo ago 4 mo ago 5 mo ago 6 mo ago  T4, Total 4.5 - 12.0 ug/dL 7.5 7.6 CM 7.0 CM 7.2 CM 8.8 CM 8.3 CM 9.3 CM      No results found for: "CEA1", "CEA" / No results found for: "CEA1", "CEA" No results found for: "PSA1" No results found for: "OZH086" No results found for: "CAN125"  No results found for: "TOTALPROTELP", "ALBUMINELP", "A1GS", "A2GS", "BETS", "BETA2SER", "GAMS", "MSPIKE", "SPEI" No results found for: "TIBC",  "FERRITIN", "IRONPCTSAT" Lab Results  Component Value Date   LDH 140 02/01/2023  LDH 160 02/07/2022    STUDIES:  Exam: 01/31/23 Nuclear Medicine Pet Whole Body IMPRESSION: Interval resolution of previously demonstrated hypermetabolic activity within the left foot. No evidence of local recurrence of metastatic disease. Resolution of right middle lobe airspace diseases seen on most recent CT. No suspicious pulmonary nodularity. New opacification of the maxillary sinus with mild peripheral hypermetabolic activity, likely inflammatory Aortic Atherosclerosis (ICD10-170.0)    I,Jasmine M Lassiter,acting as a scribe for [name], MD.,have documented all relevant documentation on the behalf of [name], MD,as directed by  [name], MD while in the presence of [name], MD.  I have reviewed this report as typed by the medical scribe, and it is complete and accurate.  [name]   07/24/23 11:35 AM

## 2023-06-22 LAB — T4: T4, Total: 8.2 ug/dL (ref 4.5–12.0)

## 2023-06-25 ENCOUNTER — Encounter: Payer: Self-pay | Admitting: Oncology

## 2023-06-25 ENCOUNTER — Inpatient Hospital Stay: Payer: Medicare HMO | Attending: Oncology

## 2023-06-25 VITALS — BP 135/89 | HR 65 | Temp 98.5°F | Resp 20 | Ht 69.39 in | Wt 230.1 lb

## 2023-06-25 DIAGNOSIS — Z5112 Encounter for antineoplastic immunotherapy: Secondary | ICD-10-CM | POA: Insufficient documentation

## 2023-06-25 DIAGNOSIS — Z79899 Other long term (current) drug therapy: Secondary | ICD-10-CM | POA: Diagnosis not present

## 2023-06-25 DIAGNOSIS — C792 Secondary malignant neoplasm of skin: Secondary | ICD-10-CM

## 2023-06-25 DIAGNOSIS — C4372 Malignant melanoma of left lower limb, including hip: Secondary | ICD-10-CM | POA: Diagnosis not present

## 2023-06-25 DIAGNOSIS — C439 Malignant melanoma of skin, unspecified: Secondary | ICD-10-CM

## 2023-06-25 MED ORDER — SODIUM CHLORIDE 0.9 % IV SOLN: Freq: Once | INTRAVENOUS | Status: AC

## 2023-06-25 MED ORDER — SODIUM CHLORIDE 0.9% FLUSH
10.0000 mL | INTRAVENOUS | Status: DC | PRN
  Administered 2023-06-25: 10 mL

## 2023-06-25 MED ORDER — NIVOLUMAB CHEMO INJECTION 100 MG/10ML
480.0000 mg | Freq: Once | INTRAVENOUS | Status: AC
  Administered 2023-06-25: 480 mg via INTRAVENOUS
  Filled 2023-06-25: qty 48

## 2023-06-25 MED ORDER — HEPARIN SOD (PORK) LOCK FLUSH 100 UNIT/ML IV SOLN
500.0000 [IU] | Freq: Once | INTRAVENOUS | Status: AC | PRN
  Administered 2023-06-25: 500 [IU]

## 2023-06-25 NOTE — Patient Instructions (Signed)
Nivolumab Injection What is this medication? NIVOLUMAB (nye VOL ue mab) treats some types of cancer. It works by helping your immune system slow or stop the spread of cancer cells. It is a monoclonal antibody. This medicine may be used for other purposes; ask your health care provider or pharmacist if you have questions. COMMON BRAND NAME(S): Opdivo What should I tell my care team before I take this medication? They need to know if you have any of these conditions: Allogeneic stem cell transplant (uses someone else's stem cells) Autoimmune diseases, such as Crohn disease, ulcerative colitis, lupus History of chest radiation Nervous system problems, such as Guillain-Barre syndrome or myasthenia gravis Organ transplant An unusual or allergic reaction to nivolumab, other medications, foods, dyes, or preservatives Pregnant or trying to get pregnant Breast-feeding How should I use this medication? This medication is infused into a vein. It is given in a hospital or clinic setting. A special MedGuide will be given to you before each treatment. Be sure to read this information carefully each time. Talk to your care team about the use of this medication in children. While it may be prescribed for children as young as 12 years for selected conditions, precautions do apply. Overdosage: If you think you have taken too much of this medicine contact a poison control center or emergency room at once. NOTE: This medicine is only for you. Do not share this medicine with others. What if I miss a dose? Keep appointments for follow-up doses. It is important not to miss your dose. Call your care team if you are unable to keep an appointment. What may interact with this medication? Interactions have not been studied. This list may not describe all possible interactions. Give your health care provider a list of all the medicines, herbs, non-prescription drugs, or dietary supplements you use. Also tell them if you  smoke, drink alcohol, or use illegal drugs. Some items may interact with your medicine. What should I watch for while using this medication? Your condition will be monitored carefully while you are receiving this medication. You may need blood work while taking this medication. This medication may cause serious skin reactions. They can happen weeks to months after starting the medication. Contact your care team right away if you notice fevers or flu-like symptoms with a rash. The rash may be red or purple and then turn into blisters or peeling of the skin. You may also notice a red rash with swelling of the face, lips, or lymph nodes in your neck or under your arms. Tell your care team right away if you have any change in your eyesight. Talk to your care team if you are pregnant or think you might be pregnant. A negative pregnancy test is required before starting this medication. A reliable form of contraception is recommended while taking this medication and for 5 months after the last dose. Talk to your care team about effective forms of contraception. Do not breast-feed while taking this medication and for 5 months after the last dose. What side effects may I notice from receiving this medication? Side effects that you should report to your care team as soon as possible: Allergic reactions--skin rash, itching, hives, swelling of the face, lips, tongue, or throat Dry cough, shortness of breath or trouble breathing Eye pain, redness, irritation, or discharge with blurry or decreased vision Heart muscle inflammation--unusual weakness or fatigue, shortness of breath, chest pain, fast or irregular heartbeat, dizziness, swelling of the ankles, feet, or hands Hormone   gland problems--headache, sensitivity to light, unusual weakness or fatigue, dizziness, fast or irregular heartbeat, increased sensitivity to cold or heat, excessive sweating, constipation, hair loss, increased thirst or amount of urine,  tremors or shaking, irritability Infusion reactions--chest pain, shortness of breath or trouble breathing, feeling faint or lightheaded Kidney injury (glomerulonephritis)--decrease in the amount of urine, red or dark brown urine, foamy or bubbly urine, swelling of the ankles, hands, or feet Liver injury--right upper belly pain, loss of appetite, nausea, light-colored stool, dark yellow or brown urine, yellowing skin or eyes, unusual weakness or fatigue Pain, tingling, or numbness in the hands or feet, muscle weakness, change in vision, confusion or trouble speaking, loss of balance or coordination, trouble walking, seizures Rash, fever, and swollen lymph nodes Redness, blistering, peeling, or loosening of the skin, including inside the mouth Sudden or severe stomach pain, bloody diarrhea, fever, nausea, vomiting Side effects that usually do not require medical attention (report these to your care team if they continue or are bothersome): Bone, joint, or muscle pain Diarrhea Fatigue Loss of appetite Nausea Skin rash This list may not describe all possible side effects. Call your doctor for medical advice about side effects. You may report side effects to FDA at 1-800-FDA-1088. Where should I keep my medication? This medication is given in a hospital or clinic. It will not be stored at home. NOTE: This sheet is a summary. It may not cover all possible information. If you have questions about this medicine, talk to your doctor, pharmacist, or health care provider.  2024 [employer] (2021-12-05 00:00:00)  

## 2023-06-26 ENCOUNTER — Other Ambulatory Visit: Payer: Self-pay

## 2023-06-28 ENCOUNTER — Other Ambulatory Visit: Payer: Self-pay | Admitting: Hematology and Oncology

## 2023-06-28 ENCOUNTER — Encounter: Payer: Self-pay | Admitting: Oncology

## 2023-06-28 DIAGNOSIS — C439 Malignant melanoma of skin, unspecified: Secondary | ICD-10-CM

## 2023-07-03 ENCOUNTER — Telehealth: Payer: Self-pay | Admitting: Oncology

## 2023-07-03 NOTE — Telephone Encounter (Signed)
Patient has been scheduled. Aware of appt date and time.    Message Header  Department: [facility] [[phone]]   Scheduling Message Entered by [name] on 06/21/2023 at  3:55 PM Priority: Routine <No visit type provided>  Department: [facility]  Provider:  Scheduling Notes:  RT 4 weeks with labs and infusion - let's do Tues 12/3

## 2023-07-14 ENCOUNTER — Other Ambulatory Visit: Payer: Self-pay | Admitting: Oncology

## 2023-07-14 DIAGNOSIS — C439 Malignant melanoma of skin, unspecified: Secondary | ICD-10-CM

## 2023-07-18 ENCOUNTER — Other Ambulatory Visit: Payer: Medicare HMO

## 2023-07-18 ENCOUNTER — Ambulatory Visit: Payer: Medicare HMO

## 2023-07-18 ENCOUNTER — Ambulatory Visit: Payer: Medicare HMO | Admitting: Oncology

## 2023-07-23 ENCOUNTER — Other Ambulatory Visit: Payer: Self-pay | Admitting: Oncology

## 2023-07-23 DIAGNOSIS — K219 Gastro-esophageal reflux disease without esophagitis: Secondary | ICD-10-CM

## 2023-07-23 NOTE — Assessment & Plan Note (Signed)
Melanoma of the left foot and left calf diagnosed on shave biopsy at dermatology in June.  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  The left calf lesion was consistent with metastatic melanoma.  CT staging revealed multiple pulmonary nodules concerning for metastatic disease.  PET scan revealed increased uptake in the left foot.  The pulmonary nodules were suboptimally evaluated, but still highly suspicious for metastatic disease. He was treated with 4 cycles of  ipilumumab/nivolumab.  He continues maintenance nivolumab every 4 weeks. He will proceed with a 19th cycle of nivolumab today. We will plan to see him back in 4 weeks for repeat clinical assessment prior to 20th cycle.

## 2023-07-23 NOTE — Progress Notes (Unsigned)
[facility]  [address] [place],  [place]  [address] [phone]  Clinic Day:  07/23/2023  Referring physician: [name], MD  ASSESSMENT & PLAN:   Assessment & Plan: No problem-specific Assessment & Plan notes found for this encounter.    The patient understands the plans discussed today and is in agreement with them.  He knows to contact our office if he develops concerns prior to his next appointment.   I provided *** minutes of face-to-face time during this encounter and > 50% was spent counseling as documented under my assessment and plan.    Adah Perl, PA-C  [facility] [place] - A DEPT OF [facility] [address] [place] [address] Dept: [phone] Dept Fax: [phone]   No orders of the defined types were placed in this encounter.     CHIEF COMPLAINT:  CC: ***  Current Treatment:  ***  HISTORY OF PRESENT ILLNESS:   Oncology History  Malignant melanoma, metastatic (HCC)  01/23/2022 Cancer Staging   Staging form: Melanoma of the Skin, AJCC 8th Edition - Clinical stage from 01/23/2022: Stage III (cT2b, cN1c, cM0) - Signed by [name], MD on 04/07/2022 Histopathologic type: Malignant melanoma, NOS (except juvenile melanoma M-[id number]) Stage prefix: Initial diagnosis Laterality: Left Lymph-vascular invasion (LVI): LVI not present (absent)/not identified Diagnostic confirmation: Positive histology Specimen type: Biopsy / Limited Resection Staged by: Managing physician Mitotic count: 4 Mitotic unit: mm2 Clark's level: Level IV Tumor-infiltrating lymphocytes: Present and non-brisk Breslow depth (mm): 16 Ulceration of the epidermis: Yes Microsatellites: No Primary tumor regression: Absent Intransit/satellite metastasis: In-transit Lymph node clinically or radiologically detected: No Microscopic confirmation of metastasis: No Sentinel lymph node biopsy performed:  No Completion or therapeutic lymph node dissection performed: No Matted nodes: No Stage used in treatment planning: Yes National guidelines used in treatment planning: Yes Type of national guideline used in treatment planning: NCCN   01/31/2022 Initial Diagnosis   Malignant melanoma, metastatic (HCC)   04/14/2022 -  Chemotherapy   Patient is on Treatment Plan : MELANOMA Nivolumab (480) q28d (changed from q14d on 12/4)     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)  02/28/2022 Cancer Staging   Staging form: Melanoma of the Skin, AJCC 8th Edition - Clinical stage from 02/28/2022: Stage IV (cT2b(m), cN1c, cM1a) - Signed by [name], MD on 04/07/2022 Histopathologic type: Malignant melanoma, NOS (except juvenile melanoma M-8770/0) Stage prefix: Initial diagnosis Laterality: Left Multiple tumors: Yes Lymph-vascular invasion (LVI): LVI present/identified, NOS Diagnostic confirmation: Positive histology Specimen type: Excision Staged by: Managing physician Intransit/satellite metastasis: Both in-transit and satellite Prognostic indicators: 02/28/22:  Wide excision of primary of foot - no residual melanoma,  Excision of skin of calf consistent with intransit mets of dermal/subcuticular   Melanoma with LVI, metastatic disease with multifocal lesions and multifocal positive margins 03/24/22: Re-excision with a few atypical melanocytes, no melanoma and clear margins   04/07/2022 Initial Diagnosis   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 He denies fevers or chills. He denies pain. His appetite is good. His weight {Weight change:10426}.  REVIEW OF SYSTEMS:  Review of Systems - Oncology   VITALS:  There were no vitals taken for this visit.  Wt Readings from Last 3 Encounters:   06/25/23 230 lb 1.3 oz (104.4 kg)  06/21/23 228 lb 1.6 oz (103.5 kg)  05/28/23 232 lb 4 oz (105.3 kg)    There is no height or weight on file to calculate BMI.  Performance status (ECOG): {CHL ONC [id number]  PHYSICAL EXAM:  Physical Exam  LABS:      Latest Ref Rng & Units 06/21/2023    3:01 PM 05/24/2023    2:58 PM 04/26/2023    2:50 PM  CBC  WBC 4.0 - 10.5 K/uL 11.9  13.3  10.9   Hemoglobin 13.0 - 17.0 g/dL 09.8  11.9  14.7   Hematocrit 39.0 - 52.0 % 42.9  41.6  41.3   Platelets 150 - 400 K/uL 226  278  236       Latest Ref Rng & Units 06/21/2023    3:01 PM 05/24/2023    2:58 PM 04/26/2023    2:50 PM  CMP  Glucose 70 - 99 mg/dL 98  829  562   BUN 8 - 23 mg/dL 27  22  21    Creatinine 0.61 - 1.24 mg/dL 1.30  8.65  7.84   Sodium 135 - 145 mmol/L 140  140  137   Potassium 3.5 - 5.1 mmol/L 3.9  4.0  3.7   Chloride 98 - 111 mmol/L 104  104  102   CO2 22 - 32 mmol/L 23  25  25    Calcium 8.9 - 10.3 mg/dL 9.1  9.3  8.9   Total Protein 6.5 - 8.1 g/dL 7.2  7.5  7.3   Total Bilirubin 0.3 - 1.2 mg/dL 1.2  0.9  0.9   Alkaline Phos 38 - 126 U/L 63  68  70   AST 15 - 41 U/L 21  18  20    ALT 0 - 44 U/L 20  16  19       No results found for: "CEA1", "CEA" / No results found for: "CEA1", "CEA" No results found for: "PSA1" No results found for: "ONG295" No results found for: "CAN125"  No results found for: "TOTALPROTELP", "ALBUMINELP", "A1GS", "A2GS", "BETS", "BETA2SER", "GAMS", "MSPIKE", "SPEI" No results found for: "TIBC", "FERRITIN", "IRONPCTSAT" Lab Results  Component Value Date   LDH 140 02/01/2023   LDH 160 02/07/2022    STUDIES:  No results found.    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 , 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     Past Surgical History:  Procedure Laterality Date   LUMBAR LAMINECTOMY/DECOMPRESSION MICRODISCECTOMY N/A 01/26/2022   Procedure: Microlumbar decompression Lumbar four-five, lateral mass fusion with autograft and allograft bone;  Surgeon: Jene Every, MD;  Location: [facility] OR;  Service: Orthopedics;  Laterality: N/A;   PARTIAL GASTRECTOMY     TOTAL HIP ARTHROPLASTY Right 06/18/2018   Procedure: RIGHT TOTAL HIP ARTHROPLASTY ANTERIOR APPROACH;  Surgeon: Durene Romans, MD;  Location: [facility] ORS;  Service: Orthopedics;  Laterality: Right;    Family History  Problem Relation Age of Onset  Lung cancer Sister    Melanoma Brother     Social History:  reports that he has quit smoking. He has never used smokeless tobacco. He reports that he does not currently use alcohol. He reports that he does not use drugs.The patient is {Blank single:19197::"alone","accompanied by"} *** today.  Allergies:  Allergies  Allergen Reactions   Morphine Itching    Patient states it is tolerable itching    Current Medications: Current Outpatient Medications  Medication Sig Dispense Refill   amLODipine (NORVASC) 5 MG tablet Take 5 mg by mouth daily.     Calcium Carbonate (CALCIUM 600 PO) Take 1 tablet by mouth 2 (two) times daily.     clopidogrel (PLAVIX) 75 MG tablet Take 1 tablet by mouth daily.     docusate sodium (COLACE) 100 MG capsule 1 capsule 2 TIMES DAILY (route: oral)     famotidine (PEPCID) 40 MG tablet TAKE 1 TABLET EVERY DAY 90 tablet 3   folic acid (FOLVITE) 400 MCG tablet Take 400 mcg by mouth daily.     gabapentin (NEURONTIN) 300 MG capsule Take 1 capsule (300 mg total) by mouth 3 (three) times daily. 270 capsule 3   hydrochlorothiazide (HYDRODIURIL) 12.5 MG tablet Take 12.5 mg by mouth daily.     lisinopril (ZESTRIL) 20 MG tablet      Multiple Vitamin (MULTIVITAMIN WITH  MINERALS) TABS tablet Take 1 tablet by mouth in the morning.     mupirocin ointment (BACTROBAN) 2 % Apply topically.     neomycin-polymyxin-hydrocortisone (CORTISPORIN) OTIC solution Place 3 drops into the right ear 4 (four) times daily. 10 mL 1   Omega-3 1000 MG CAPS Take by mouth.     ondansetron (ZOFRAN) 4 MG tablet Take 1 tablet (4 mg total) by mouth every 4 (four) hours as needed for nausea. 90 tablet 3   oxyCODONE (OXY IR/ROXICODONE) 5 MG immediate release tablet Take 1 tablet (5 mg total) by mouth every 6 (six) hours as needed for severe pain. 30 tablet 0   polyethylene glycol (MIRALAX / GLYCOLAX) 17 g packet Take 17 g by mouth daily. 14 each 0   pravastatin (PRAVACHOL) 40 MG tablet Take 40 mg by mouth daily.     prochlorperazine (COMPAZINE) 10 MG tablet Take 1 tablet (10 mg total) by mouth every 6 (six) hours as needed for nausea or vomiting. 90 tablet 3   traZODone (DESYREL) 50 MG tablet TAKE 1 TABLET AT BEDTIME AS NEEDED FOR SLEEP 30 tablet 11   triamcinolone cream (KENALOG) 0.1 % Apply topically 2 (two) times daily.     No current facility-administered medications for this visit.

## 2023-07-24 ENCOUNTER — Inpatient Hospital Stay: Payer: Medicare HMO | Admitting: Hematology and Oncology

## 2023-07-24 ENCOUNTER — Inpatient Hospital Stay: Payer: Medicare HMO

## 2023-07-24 ENCOUNTER — Encounter: Payer: Self-pay | Admitting: Oncology

## 2023-07-24 DIAGNOSIS — C439 Malignant melanoma of skin, unspecified: Secondary | ICD-10-CM

## 2023-07-25 ENCOUNTER — Encounter: Payer: Self-pay | Admitting: Oncology

## 2023-07-26 ENCOUNTER — Encounter: Payer: Self-pay | Admitting: Oncology

## 2023-07-26 ENCOUNTER — Other Ambulatory Visit: Payer: Self-pay | Admitting: Hematology and Oncology

## 2023-07-26 DIAGNOSIS — C439 Malignant melanoma of skin, unspecified: Secondary | ICD-10-CM

## 2023-07-26 NOTE — Progress Notes (Shared)
[facility]  [address] [place],  [place]  [address] [phone]  Clinic Day:  07/27/2023  Referring physician: [name], MD  ASSESSMENT & PLAN:   Assessment & Plan: Malignant melanoma of the left foot He had a punch biopsy and this reveals a Breslow thickness of 1.6 mm and Clark's level 4 with ulceration for a T2b N0 M0 clinically.  We found no distant metastasis on PET scan, but he did have metastasis to the left lower extremity.  His tumor is positive for BRAF and PDL1 mutation. He had this treated with wide local excision.   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   Multiple lesions of the lung These were suspicious for metastatic disease but resolved on the CT scan in December 2023. PET scan in June was negative, but we cannot absolutely rule out metastatic disease since these lesions are small. We discussed the fact that the resolution of these lesions could represent a response to treatment, or may not have been malignant at all.  We think it is more likely the former, and so I recommend continuation of therapy.  We have had several discussions about this.   His insurance denied repeat CT imaging at this time. As he is asymptomatic, we will hold off on repeat imaging.  I definitely recommend imaging at lease yearly. He will proceed with the 19th cycle of Nivolumab today. We will see him back  in 4 weeks with CBC, CMP, TSH, and T4 prior to his 20th cycle.   Spinal stenosis at L4-5 He has had surgical decompression and resection of a synovial cyst in June 2023.   Leukocytosis He has had chronic leukocytosis and we did not find an explanation but the [facility] continue to fluctuate up and down. He remains without symptoms of infections.   Lymphedema of the Left Lower  Extremity We discussed this diagnosis and the limited approaches available. This is stable.  The patient understands the plans discussed today and is in agreement with them.  He knows to contact our office if he develops concerns prior to his next appointment.   I provided 20 minutes of face-to-face time during this encounter and > 50% was spent counseling as documented under my assessment and plan.    Adah Perl, PA-C  [facility] [place] [facility] [address] [place] [address] Dept: [phone] Dept Fax: [phone]   No orders of the defined types were placed in this encounter.     CHIEF COMPLAINT:  CC: Malignant melanoma   Current Treatment:  Evaluation and immunotherapy   HISTORY OF PRESENT ILLNESS:  [name] is a 77 y.o. male with a history of malignant melanoma who is referred in consultation with Dr. Luna Kitchens for assessment and management.  He had a lesion of the dorsum of the left foot in September 2022 which appeared to be a granuloma and was treated in the office.  He later developed a another lesion lateral to this on the dorsum of the left foot which was not hyperpigmented but did bleed easily.  He also has several lesions of his left medial calf which are nodular and span approximately 6 cm.  He has had some increased edema of the  left lower leg.  He was evaluated by dermatology on June 5, who felt this could be a squamous cell carcinoma versus tinea or a drug reaction.  He had biopsies done of both the foot lesion and the Lesion.  Pathology came back that the left dorsal foot shave biopsy revealed a malignant melanoma possibly a primary nodular ulcerated melanoma which had a Breslow thickness of at least 1.6 mm and a Clark's level 4.  Margins are positive as this was a shave biopsy.  Ulceration is present but no satellitosis.  The mitotic index is 4/mm and no lymphovascular invasion was identified.   No brisk tumor infiltrating lymphocytes are seen and tumor regression is absent.  This is felt to be at least a T2b lesion and wide excision was recommended.  The lesion of the left medial calf revealed a dermal map melanoma most consistent with metastatic disease. BRAF and PDL1 are positive.  This information was not available when the patient was admitted for back surgery on June 8 and he had a microlumbar decompression at L4-5 for spinal stenosis.  He was also found to have a synovial cyst at that time which was resected and benign.  He was readmitted several days later for acute pain of the right elbow and both arms.  Aspiration was obtained and this was diagnosed as gouty arthritis.  He did have temporary rehab after his back surgery.  His left leg was evaluated for deep venous thrombosis and was negative.  While he was recuperating from his surgery he had an x-ray which was abnormal which led to a CT scan which revealed at least 3 lung lesions.  MRI of the brain was negative and he is now referred for management of the malignant melanoma. PET scan on 01/31/2023  revealed interval resolution of previously demonstrated hypermetabolic activity within the left foot, no evidence of local recurrence of metastatic disease, resolution of right middle lobe airspace diseases seen on most recent CT. No suspicious pulmonary nodularity, new opacification of the maxillary sinus with mild peripheral hypermetabolic activity, likely inflammatory, and aortic atherosclerosis.    Oncology History  Malignant melanoma, metastatic (HCC)  01/23/2022 Cancer Staging   Staging form: Melanoma of the Skin, AJCC 8th Edition - Clinical stage from 01/23/2022: Stage III (cT2b, cN1c, cM0) - Signed by [name], MD on 04/07/2022 Histopathologic type: Malignant melanoma, NOS (except juvenile melanoma M-8770/[id number]) Stage prefix: Initial diagnosis Laterality: Left Lymph-vascular invasion (LVI): LVI not present (absent)/not  identified Diagnostic confirmation: Positive histology Specimen type: Biopsy / Limited Resection Staged by: Managing physician Mitotic count: 4 Mitotic unit: mm2 Clark's level: Level IV Tumor-infiltrating lymphocytes: Present and non-brisk Breslow depth (mm): 16 Ulceration of the epidermis: Yes Microsatellites: No Primary tumor regression: Absent Intransit/satellite metastasis: In-transit Lymph node clinically or radiologically detected: No Microscopic confirmation of metastasis: No Sentinel lymph node biopsy performed: No Completion or therapeutic lymph node dissection performed: No Matted nodes: No Stage used in treatment planning: Yes National guidelines used in treatment planning: Yes Type of national guideline used in treatment planning: NCCN   01/31/2022 Initial Diagnosis   Malignant melanoma, metastatic (HCC)   04/14/2022 -  Chemotherapy   Patient is on Treatment Plan : MELANOMA Nivolumab (480) q28d (changed from q14d on 12/4)     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)  02/28/2022 Cancer Staging   Staging form: Melanoma of the Skin, AJCC 8th Edition - Clinical stage from 02/28/2022: Stage IV (cT2b(m), cN1c, cM1a) - Signed by [name], MD  on 04/07/2022 Histopathologic type: Malignant melanoma, NOS (except juvenile melanoma M-8770/[id number]) Stage prefix: Initial diagnosis Laterality: Left Multiple tumors: Yes Lymph-vascular invasion (LVI): LVI present/identified, NOS Diagnostic confirmation: Positive histology Specimen type: Excision Staged by: Managing physician Intransit/satellite metastasis: Both in-transit and satellite Prognostic indicators: 02/28/22:  Wide excision of primary of foot - no residual melanoma,  Excision of skin of calf consistent with intransit mets of dermal/subcuticular   Melanoma with LVI, metastatic disease with multifocal lesions and multifocal positive margins 03/24/22: Re-excision with a few atypical melanocytes, no melanoma and clear  margins   04/07/2022 Initial Diagnosis   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  He states he continues to do well and denies complaints.  He denies fevers or chills. He denies pain. His appetite is good. His weight has increased 1 pounds over last month. He went to dermatology to check the irregular nevus of the right lower back which was felt to be benign.  We had request CT imaging prior to his visit today, be we were informed that insurance denied this.   REVIEW OF SYSTEMS:  Review of Systems  Constitutional:  Negative for appetite change, chills, fatigue, fever and unexpected weight change.  HENT:   Negative for lump/mass, mouth sores and sore throat.   Respiratory:  Negative for cough and shortness of breath.   Cardiovascular:  Negative for chest pain and leg swelling.  Gastrointestinal:  Negative for abdominal pain, constipation, diarrhea, nausea and vomiting.  Genitourinary:  Negative for difficulty urinating, dysuria, frequency and hematuria.   Musculoskeletal:  Negative for arthralgias, back pain and myalgias.  Skin:  Negative for itching, rash and wound.  Neurological:  Negative for dizziness, extremity weakness, headaches, light-headedness and numbness.  Hematological:  Negative for adenopathy. Does not bruise/bleed easily.  Psychiatric/Behavioral:  Negative for depression and sleep disturbance. The patient is not nervous/anxious.      VITALS:  Blood pressure (!) 141/73, pulse 68, temperature 97.9 F (36.6 C), temperature source Oral, resp. rate 18, height 5' 9.39" (1.763 m), weight 229 lb 3.2 oz (104 kg), SpO2 98%.  Wt Readings from Last 3 Encounters:  07/27/23 229 lb 3.2 oz (104 kg)  06/25/23 230 lb 1.3 oz (104.4 kg)  06/21/23 228 lb 1.6  oz (103.5 kg)    Body mass index is 33.47 kg/m.  Performance status (ECOG): 1 - Symptomatic but completely ambulatory  PHYSICAL EXAM:  Physical Exam Vitals and nursing note reviewed.  Constitutional:      General: He is not in acute distress.    Appearance: Normal appearance. He is normal weight.  HENT:     Head: Normocephalic and atraumatic.     Mouth/Throat:     Mouth: Mucous membranes are moist.     Pharynx: Oropharynx is clear. No oropharyngeal exudate or posterior oropharyngeal erythema.  Eyes:     General: No scleral icterus.    Extraocular Movements: Extraocular movements intact.     Conjunctiva/sclera: Conjunctivae normal.     Pupils: Pupils are equal, round, and reactive to light.  Cardiovascular:     Rate and Rhythm: Normal rate and regular rhythm.     Heart sounds: Normal heart sounds. No murmur heard.    No friction rub. No  gallop.  Pulmonary:     Effort: Pulmonary effort is normal.     Breath sounds: Normal breath sounds. No wheezing, rhonchi or rales.  Abdominal:     General: Bowel sounds are normal. There is no distension.     Palpations: Abdomen is soft. There is no hepatomegaly, splenomegaly or mass.     Tenderness: There is no abdominal tenderness.  Musculoskeletal:        General: Normal range of motion.     Cervical back: Normal range of motion and neck supple. No tenderness.     Right lower leg: No edema.     Left lower leg: No edema (trace).  Lymphadenopathy:     Cervical: No cervical adenopathy.     Upper Body:     Right upper body: No supraclavicular or axillary adenopathy.     Left upper body: No supraclavicular or axillary adenopathy.     Lower Body: No right inguinal adenopathy. No left inguinal adenopathy.  Skin:    General: Skin is warm and dry.     Coloration: Skin is not jaundiced.     Findings: No rash. Scaling rash: Scattered benign appearing nevi.     Comments: Extensive scarring of the left medial knee without evidence of recurrence.  Scattered benign appearing nevi.  Neurological:     General: No focal deficit present.     Mental Status: He is alert and oriented to person, place, and time. Mental status is at baseline.     Cranial Nerves: No cranial nerve deficit.  Psychiatric:        Mood and Affect: Mood normal.        Behavior: Behavior normal.        Thought Content: Thought content normal.        Judgment: Judgment normal.    LABS:      Latest Ref Rng & Units 07/27/2023   10:53 AM 06/21/2023    3:01 PM 05/24/2023    2:58 PM  CBC  WBC 4.0 - 10.5 K/uL 11.8  11.9  13.3   Hemoglobin 13.0 - 17.0 g/dL 62.1  30.8  65.7   Hematocrit 39.0 - 52.0 % 40.4  42.9  41.6   Platelets 150 - 400 K/uL 266  226  278       Latest Ref Rng & Units 07/27/2023   10:53 AM 06/21/2023    3:01 PM 05/24/2023    2:58 PM  CMP  Glucose 70 - 99 mg/dL 84  98  846   BUN 8 - 23 mg/dL 26  27  22    Creatinine 0.61 - 1.24 mg/dL 9.62  9.52  8.41   Sodium 135 - 145 mmol/L 140  140  140   Potassium 3.5 - 5.1 mmol/L 4.1  3.9  4.0   Chloride 98 - 111 mmol/L 103  104  104   CO2 22 - 32 mmol/L 25  23  25    Calcium 8.9 - 10.3 mg/dL 9.6  9.1  9.3   Total Protein 6.5 - 8.1 g/dL 7.0  7.2  7.5   Total Bilirubin <1.2 mg/dL 0.7  1.2  0.9   Alkaline Phos 38 - 126 U/L 95  63  68   AST 15 - 41 U/L 21  21  18    ALT 0 - 44 U/L 16  20  16      Lab Results  Component Value Date   LDH 140 02/01/2023   LDH 160 02/07/2022    STUDIES:  No results found.   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 , 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     Past Surgical History:  Procedure Laterality Date    LUMBAR LAMINECTOMY/DECOMPRESSION MICRODISCECTOMY N/A 01/26/2022   Procedure: Microlumbar decompression Lumbar four-five, lateral mass fusion with autograft and allograft bone;  Surgeon: Jene Every, MD;  Location: [facility] OR;  Service: Orthopedics;  Laterality: N/A;   PARTIAL GASTRECTOMY     TOTAL HIP ARTHROPLASTY Right 06/18/2018   Procedure: RIGHT TOTAL HIP ARTHROPLASTY ANTERIOR APPROACH;  Surgeon: Durene Romans, MD;  Location: [facility] ORS;  Service: Orthopedics;  Laterality: Right;    Family History  Problem Relation Age of Onset   Lung cancer Sister    Melanoma Brother     Social History:  reports that he has quit smoking. He has never used smokeless tobacco. He reports that he does not currently use alcohol. He reports that he does not use drugs.The patient is alone today.  Allergies:  Allergies  Allergen Reactions   Morphine Itching    Patient states it is tolerable itching    Current Medications: Current Outpatient Medications  Medication Sig Dispense Refill   amLODipine (NORVASC) 5 MG tablet Take 5 mg by mouth daily.     Calcium Carbonate (CALCIUM 600 PO) Take 1 tablet by mouth 2 (two) times daily.     clopidogrel (PLAVIX) 75 MG tablet Take 1 tablet by mouth daily.     docusate sodium (COLACE) 100 MG capsule 1 capsule 2 TIMES DAILY (route: oral)     famotidine (PEPCID) 40 MG tablet TAKE 1 TABLET EVERY DAY 90 tablet 3   folic acid (FOLVITE) 400 MCG tablet Take 400 mcg by mouth daily.     gabapentin (NEURONTIN) 300 MG capsule Take 1 capsule (300 mg total) by mouth 3 (three) times daily. 270 capsule 3   hydrochlorothiazide (HYDRODIURIL) 12.5 MG tablet Take 12.5 mg by mouth daily.     lisinopril (ZESTRIL) 20 MG tablet      Multiple Vitamin (MULTIVITAMIN WITH MINERALS) TABS tablet Take 1 tablet by mouth in the morning.     mupirocin ointment (BACTROBAN) 2 % Apply topically.     neomycin-polymyxin-hydrocortisone (CORTISPORIN) OTIC solution Place 3 drops into the right ear 4 (four) times  daily. 10 mL 1   Omega-3 1000 MG CAPS Take by mouth.     ondansetron (ZOFRAN) 4 MG tablet Take 1 tablet (4 mg total) by mouth every 4 (four) hours as needed for nausea. 90 tablet 3   oxyCODONE (OXY IR/ROXICODONE) 5 MG immediate release tablet Take 1 tablet (5 mg total) by mouth every 6 (six) hours as needed for severe pain. (Patient not taking: Reported on 07/27/2023) 30 tablet 0   polyethylene glycol (MIRALAX / GLYCOLAX) 17 g packet Take 17 g by mouth daily. 14 each 0   pravastatin (PRAVACHOL) 40 MG tablet Take 40 mg by mouth daily.     prochlorperazine (COMPAZINE) 10 MG tablet Take 1 tablet (10 mg total) by mouth every 6 (six) hours as  needed for nausea or vomiting. 90 tablet 3   traZODone (DESYREL) 50 MG tablet TAKE 1 TABLET AT BEDTIME AS NEEDED FOR SLEEP 30 tablet 11   triamcinolone cream (KENALOG) 0.1 % Apply topically 2 (two) times daily.     No current facility-administered medications for this visit.    I,Jasmine M Lassiter,acting as a Neurosurgeon for [employer], PA-C.,have documented all relevant documentation on the behalf of Adah Perl, PA-C,as directed by  Adah Perl, PA-C while in the presence of [employer], PA-C.

## 2023-07-27 ENCOUNTER — Inpatient Hospital Stay: Payer: Medicare HMO

## 2023-07-27 ENCOUNTER — Encounter: Payer: Self-pay | Admitting: Hematology and Oncology

## 2023-07-27 ENCOUNTER — Inpatient Hospital Stay (HOSPITAL_BASED_OUTPATIENT_CLINIC_OR_DEPARTMENT_OTHER): Payer: Medicare HMO | Attending: Oncology | Admitting: Hematology and Oncology

## 2023-07-27 ENCOUNTER — Inpatient Hospital Stay: Payer: Medicare HMO | Attending: Oncology

## 2023-07-27 VITALS — BP 141/73 | HR 68 | Temp 97.9°F | Resp 18 | Ht 69.39 in | Wt 229.2 lb

## 2023-07-27 DIAGNOSIS — C439 Malignant melanoma of skin, unspecified: Secondary | ICD-10-CM

## 2023-07-27 DIAGNOSIS — Z79899 Other long term (current) drug therapy: Secondary | ICD-10-CM | POA: Diagnosis not present

## 2023-07-27 DIAGNOSIS — C792 Secondary malignant neoplasm of skin: Secondary | ICD-10-CM

## 2023-07-27 DIAGNOSIS — C4372 Malignant melanoma of left lower limb, including hip: Secondary | ICD-10-CM | POA: Diagnosis not present

## 2023-07-27 DIAGNOSIS — R918 Other nonspecific abnormal finding of lung field: Secondary | ICD-10-CM

## 2023-07-27 DIAGNOSIS — D72829 Elevated white blood cell count, unspecified: Secondary | ICD-10-CM

## 2023-07-27 DIAGNOSIS — Z5112 Encounter for antineoplastic immunotherapy: Secondary | ICD-10-CM | POA: Diagnosis not present

## 2023-07-27 LAB — CBC WITH DIFFERENTIAL (CANCER CENTER ONLY)
Abs Immature Granulocytes: 0.03 10*3/uL (ref 0.00–0.07)
Basophils Absolute: 0 10*3/uL (ref 0.0–0.1)
Basophils Relative: 0 %
Eosinophils Absolute: 0.3 10*3/uL (ref 0.0–0.5)
Eosinophils Relative: 3 %
HCT: 40.4 % (ref 39.0–52.0)
Hemoglobin: 13.9 g/dL (ref 13.0–17.0)
Immature Granulocytes: 0 %
Lymphocytes Relative: 27 %
Lymphs Abs: 3.1 10*3/uL (ref 0.7–4.0)
MCH: 29.1 pg (ref 26.0–34.0)
MCHC: 34.4 g/dL (ref 30.0–36.0)
MCV: 84.7 fL (ref 80.0–100.0)
Monocytes Absolute: 1 10*3/uL (ref 0.1–1.0)
Monocytes Relative: 9 %
Neutro Abs: 7.3 10*3/uL (ref 1.7–7.7)
Neutrophils Relative %: 61 %
Platelet Count: 266 10*3/uL (ref 150–400)
RBC: 4.77 MIL/uL (ref 4.22–5.81)
RDW: 13.1 % (ref 11.5–15.5)
WBC Count: 11.8 10*3/uL — ABNORMAL HIGH (ref 4.0–10.5)
nRBC: 0 % (ref 0.0–0.2)
nRBC: 0 /100{WBCs}

## 2023-07-27 LAB — TSH: TSH: 0.912 u[IU]/mL (ref 0.350–4.500)

## 2023-07-27 LAB — CMP (CANCER CENTER ONLY)
ALT: 16 U/L (ref 0–44)
AST: 21 U/L (ref 15–41)
Albumin: 4.3 g/dL (ref 3.5–5.0)
Alkaline Phosphatase: 95 U/L (ref 38–126)
Anion gap: 12 (ref 5–15)
BUN: 26 mg/dL — ABNORMAL HIGH (ref 8–23)
CO2: 25 mmol/L (ref 22–32)
Calcium: 9.6 mg/dL (ref 8.9–10.3)
Chloride: 103 mmol/L (ref 98–111)
Creatinine: 1.21 mg/dL (ref 0.61–1.24)
GFR, Estimated: >60 mL/min (ref >60)
Glucose, Bld: 84 mg/dL (ref 70–99)
Potassium: 4.1 mmol/L (ref 3.5–5.1)
Sodium: 140 mmol/L (ref 135–145)
Total Bilirubin: 0.7 mg/dL (ref <1.2)
Total Protein: 7 g/dL (ref 6.5–8.1)

## 2023-07-27 MED ORDER — HEPARIN SOD (PORK) LOCK FLUSH 100 UNIT/ML IV SOLN
500.0000 [IU] | Freq: Once | INTRAVENOUS | Status: AC | PRN
  Administered 2023-07-27: 500 [IU]

## 2023-07-27 MED ORDER — SODIUM CHLORIDE 0.9 % IV SOLN
480.0000 mg | Freq: Once | INTRAVENOUS | Status: AC
  Administered 2023-07-27: 480 mg via INTRAVENOUS
  Filled 2023-07-27: qty 48

## 2023-07-27 MED ORDER — SODIUM CHLORIDE 0.9 % IV SOLN
Freq: Once | INTRAVENOUS | Status: DC
Start: 2023-07-27 — End: 2023-07-27

## 2023-07-27 MED ORDER — SODIUM CHLORIDE 0.9% FLUSH
10.0000 mL | INTRAVENOUS | Status: DC | PRN
  Administered 2023-07-27: 10 mL

## 2023-07-27 NOTE — Patient Instructions (Signed)

## 2023-07-28 ENCOUNTER — Other Ambulatory Visit: Payer: Self-pay

## 2023-07-28 ENCOUNTER — Encounter: Payer: Self-pay | Admitting: Hematology and Oncology

## 2023-07-28 ENCOUNTER — Encounter: Payer: Self-pay | Admitting: Oncology

## 2023-07-29 LAB — T4: T4, Total: 7.4 ug/dL (ref 4.5–12.0)

## 2023-08-05 ENCOUNTER — Other Ambulatory Visit: Payer: Self-pay

## 2023-08-09 ENCOUNTER — Other Ambulatory Visit: Payer: Medicare HMO

## 2023-08-09 ENCOUNTER — Ambulatory Visit: Payer: Medicare HMO

## 2023-08-09 ENCOUNTER — Ambulatory Visit: Payer: Medicare HMO | Admitting: Oncology

## 2023-08-21 ENCOUNTER — Ambulatory Visit: Payer: Medicare HMO

## 2023-08-21 ENCOUNTER — Ambulatory Visit: Payer: Medicare HMO | Admitting: Oncology

## 2023-08-21 ENCOUNTER — Other Ambulatory Visit: Payer: Medicare HMO

## 2023-08-24 ENCOUNTER — Inpatient Hospital Stay: Payer: Medicare HMO

## 2023-08-24 ENCOUNTER — Telehealth: Payer: Self-pay | Admitting: Oncology

## 2023-08-24 ENCOUNTER — Inpatient Hospital Stay (HOSPITAL_BASED_OUTPATIENT_CLINIC_OR_DEPARTMENT_OTHER): Payer: Medicare HMO | Admitting: Oncology

## 2023-08-24 ENCOUNTER — Encounter: Payer: Self-pay | Admitting: Oncology

## 2023-08-24 ENCOUNTER — Other Ambulatory Visit: Payer: Self-pay | Admitting: Oncology

## 2023-08-24 ENCOUNTER — Inpatient Hospital Stay: Payer: Medicare HMO | Attending: Oncology

## 2023-08-24 VITALS — BP 154/74 | HR 69 | Temp 97.8°F | Resp 18 | Ht 69.39 in | Wt 234.0 lb

## 2023-08-24 DIAGNOSIS — Z5112 Encounter for antineoplastic immunotherapy: Secondary | ICD-10-CM | POA: Diagnosis not present

## 2023-08-24 DIAGNOSIS — C439 Malignant melanoma of skin, unspecified: Secondary | ICD-10-CM

## 2023-08-24 DIAGNOSIS — C792 Secondary malignant neoplasm of skin: Secondary | ICD-10-CM

## 2023-08-24 DIAGNOSIS — Z79899 Other long term (current) drug therapy: Secondary | ICD-10-CM | POA: Diagnosis not present

## 2023-08-24 DIAGNOSIS — C4372 Malignant melanoma of left lower limb, including hip: Secondary | ICD-10-CM | POA: Diagnosis not present

## 2023-08-24 LAB — CMP (CANCER CENTER ONLY)
ALT: 22 U/L (ref 0–44)
AST: 22 U/L (ref 15–41)
Albumin: 4.4 g/dL (ref 3.5–5.0)
Alkaline Phosphatase: 96 U/L (ref 38–126)
Anion gap: 11 (ref 5–15)
BUN: 23 mg/dL (ref 8–23)
CO2: 28 mmol/L (ref 22–32)
Calcium: 9.6 mg/dL (ref 8.9–10.3)
Chloride: 104 mmol/L (ref 98–111)
Creatinine: 1.1 mg/dL (ref 0.61–1.24)
GFR, Estimated: >60 mL/min (ref >60)
Glucose, Bld: 97 mg/dL (ref 70–99)
Potassium: 4.3 mmol/L (ref 3.5–5.1)
Sodium: 143 mmol/L (ref 135–145)
Total Bilirubin: 0.8 mg/dL (ref 0.0–1.2)
Total Protein: 7 g/dL (ref 6.5–8.1)

## 2023-08-24 LAB — CBC WITH DIFFERENTIAL (CANCER CENTER ONLY)
Abs Immature Granulocytes: 0.05 10*3/uL (ref 0.00–0.07)
Basophils Absolute: 0 10*3/uL (ref 0.0–0.1)
Basophils Relative: 0 %
Eosinophils Absolute: 0.4 10*3/uL (ref 0.0–0.5)
Eosinophils Relative: 3 %
HCT: 38.9 % — ABNORMAL LOW (ref 39.0–52.0)
Hemoglobin: 13.7 g/dL (ref 13.0–17.0)
Immature Granulocytes: 0 %
Lymphocytes Relative: 20 %
Lymphs Abs: 2.9 10*3/uL (ref 0.7–4.0)
MCH: 30.2 pg (ref 26.0–34.0)
MCHC: 35.2 g/dL (ref 30.0–36.0)
MCV: 85.7 fL (ref 80.0–100.0)
Monocytes Absolute: 1.5 10*3/uL — ABNORMAL HIGH (ref 0.1–1.0)
Monocytes Relative: 10 %
Neutro Abs: 9.8 10*3/uL — ABNORMAL HIGH (ref 1.7–7.7)
Neutrophils Relative %: 67 %
Platelet Count: 226 10*3/uL (ref 150–400)
RBC: 4.54 MIL/uL (ref 4.22–5.81)
RDW: 13.7 % (ref 11.5–15.5)
WBC Count: 14.7 10*3/uL — ABNORMAL HIGH (ref 4.0–10.5)
nRBC: 0 % (ref 0.0–0.2)
nRBC: 0 /100{WBCs}

## 2023-08-24 LAB — TSH: TSH: 1.297 u[IU]/mL (ref 0.350–4.500)

## 2023-08-24 MED ORDER — HEPARIN SOD (PORK) LOCK FLUSH 100 UNIT/ML IV SOLN
500.0000 [IU] | Freq: Once | INTRAVENOUS | Status: AC | PRN
  Administered 2023-08-24: 500 [IU]

## 2023-08-24 MED ORDER — SODIUM CHLORIDE 0.9 % IV SOLN: Freq: Once | INTRAVENOUS | Status: AC

## 2023-08-24 MED ORDER — SODIUM CHLORIDE 0.9 % IV SOLN
480.0000 mg | Freq: Once | INTRAVENOUS | Status: AC
  Administered 2023-08-24: 480 mg via INTRAVENOUS
  Filled 2023-08-24: qty 48

## 2023-08-24 MED ORDER — SODIUM CHLORIDE 0.9% FLUSH
10.0000 mL | INTRAVENOUS | Status: DC | PRN
  Administered 2023-08-24: 10 mL

## 2023-08-24 NOTE — Progress Notes (Signed)
 [facility]  [address] [place],  [place]  [address] [phone]  Clinic Day:  08/24/23  Referring physician: Fernand Money, MD  ASSESSMENT & PLAN:  Assessment: Malignant melanoma of the left foot He had a punch biopsy and this reveals a Breslow thickness of 1.6 mm and Clark's level 4 with ulceration for a T2b N0 M0 clinically.  We found no distant metastasis on PET scan, but he did have metastasis to the left lower extremity.  His tumor is positive for BRAF and PDL1 mutation. He had this treated with wide local excision.   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   Multiple lesions of the lung These were suspicious for metastatic disease but resolved on the CT scan in December 2023. PET scan in June was negative, but we cannot absolutely rule out metastatic disease since these lesions are small. We discussed the fact that the resolution of these lesions could represent a response to treatment, or may not have been malignant at all.  We think it is more likely the former, and so I recommend continuation of therapy.  We have had several discussions about this.   Spinal stenosis at L4-5 He has had surgical decompression and resection of a synovial cyst in June 2023.   Leukocytosis He has had chronic leukocytosis and we did not find an explanation but the [facility] continue to fluctuate up and down. His WBC is higher than usual probably related to his upper respiratory viral infection.    Lymphedema of the Left Lower Extremity We discussed this diagnosis and the limited approaches available. This is improved.  Plan: Patient developed a head cold with a cough with red/brown phlegm. He has been taking Mucinex for the last 2-3 days and is also currently on Plavix  75mg . I informed him to look  out for yellow or green phlegm and if he develops this or fevers or worsening infection, then to contact my office or his PCP. He informed me that his last CT scan was canceled and we will get that rescheduled. His last PET scan was 01/31/2023 and last CT chest, abdomen, and pelvis was done on 08/16/2022 and I agree that he will still need a repeat CT scan to follow-up on his pulmonary nodules and rule out any other metastatic disease. We will get that scheduled and let him know about the results. His day 1 cycle 20 of Nivolumab  is scheduled on 08/24/2023. He has a elevated WBC of 14.7, hemoglobin of 13.7, and platelet count of 226,000 and a completley normal CMP. His TSH and T4 today are pending. I will see him back in 4 weeks with  CBC, CMP, TSH, and T4. The patient understands the plans discussed today and is in agreement with them.  He knows to contact our office if he develops concerns prior to his next appointment.  I provided 14 minutes of face-to-face time during this encounter and > 50% was spent counseling as documented under my assessment and plan.   [name], MD  [facility] A DEPT OF [facility] [address] [place] [address] Dept: [phone] Dept Fax: [phone]   No orders of the defined types were placed in this encounter.   CHIEF COMPLAINT:  CC:  Malignant melanoma   Current Treatment:  Evaluation and immunotherapy   HISTORY OF PRESENT ILLNESS:  [name] is a 78 y.o. male with a history of malignant melanoma who is referred in consultation with Dr. Tracey Bathe for assessment and management.  He had a lesion of the dorsum of the left foot in September 2022 which appeared to be a granuloma and was treated in the office.  He later developed a another lesion lateral to this on the dorsum of the left foot which was not hyperpigmented but did bleed easily.  He also has several lesions of his left medial calf  which are nodular and span approximately 6 cm.  He has had some increased edema of the left lower leg.  He was evaluated by dermatology on June 5, who felt this could be a squamous cell carcinoma versus tinea or a drug reaction.  He had biopsies done of both the foot lesion and the Lesion.  Pathology came back that the left dorsal foot shave biopsy revealed a malignant melanoma possibly a primary nodular ulcerated melanoma which had a Breslow thickness of at least 1.6 mm and a Clark's level 4.  Margins are positive as this was a shave biopsy.  Ulceration is present but no satellitosis.  The mitotic index is 4/mm and no lymphovascular invasion was identified.  No brisk tumor infiltrating lymphocytes are seen and tumor regression is absent.  This is felt to be at least a T2b lesion and wide excision was recommended.  The lesion of the left medial calf revealed a dermal map melanoma most consistent with metastatic disease. BRAF and PDL1 are positive.  This information was not available when the patient was admitted for back surgery on June 8 and he had a microlumbar decompression at L4-5 for spinal stenosis.  He was also found to have a synovial cyst at that time which was resected and benign.  He was readmitted several days later for acute pain of the right elbow and both arms.  Aspiration was obtained and this was diagnosed as gouty arthritis.  He did have temporary rehab after his back surgery.  His left leg was evaluated for deep venous thrombosis and was negative.  While he was recuperating from his surgery he had an x-ray which was abnormal which led to a CT scan which revealed at least 3 lung lesions.  MRI of the brain was negative and he is now referred for management of the malignant melanoma. PET scan on 01/31/2023  revealed interval resolution of previously demonstrated hypermetabolic activity within the left foot, no evidence of local recurrence of metastatic disease, resolution of right middle lobe  airspace diseases seen on most recent CT. No suspicious pulmonary nodularity, new opacification of the maxillary sinus with mild peripheral hypermetabolic activity, likely inflammatory, and aortic atherosclerosis.    Oncology History  Malignant melanoma, metastatic (HCC)  01/23/2022 Cancer Staging   Staging form: Melanoma of the Skin, AJCC 8th Edition - Clinical stage from 01/23/2022: Stage III (cT2b, cN1c, cM0) - Signed by Cornelius Wanda DEL, MD on 04/07/2022 Histopathologic type: Malignant melanoma, NOS (except juvenile melanoma M-8770/[id number]) Stage prefix: Initial diagnosis Laterality: Left Lymph-vascular invasion (LVI): LVI not present (absent)/not identified Diagnostic confirmation: Positive histology Specimen type: Biopsy / Limited Resection Staged by: Managing physician Mitotic count: 4 Mitotic unit: mm2 Clark's level: Level IV Tumor-infiltrating lymphocytes: Present and non-brisk Breslow depth (mm): 16 Ulceration of the epidermis: Yes Microsatellites: No Primary tumor regression: Absent Intransit/satellite metastasis: In-transit Lymph node clinically or  radiologically detected: No Microscopic confirmation of metastasis: No Sentinel lymph node biopsy performed: No Completion or therapeutic lymph node dissection performed: No Matted nodes: No Stage used in treatment planning: Yes National guidelines used in treatment planning: Yes Type of national guideline used in treatment planning: NCCN   01/31/2022 Initial Diagnosis   Malignant melanoma, metastatic (HCC)   04/14/2022 -  Chemotherapy   Patient is on Treatment Plan : MELANOMA Nivolumab  (480) q28d (changed from q14d on 12/4)     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)  02/28/2022 Cancer Staging   Staging form: Melanoma of the Skin, AJCC 8th Edition - Clinical stage from 02/28/2022: Stage IV (cT2b(m), cN1c, cM1a) - Signed by Cornelius Wanda DEL, MD on 04/07/2022 Histopathologic type: Malignant melanoma, NOS (except juvenile  melanoma M-8770/[id number]) Stage prefix: Initial diagnosis Laterality: Left Multiple tumors: Yes Lymph-vascular invasion (LVI): LVI present/identified, NOS Diagnostic confirmation: Positive histology Specimen type: Excision Staged by: Managing physician Intransit/satellite metastasis: Both in-transit and satellite Prognostic indicators: 02/28/22:  Wide excision of primary of foot - no residual melanoma,  Excision of skin of calf consistent with intransit mets of dermal/subcuticular   Melanoma with LVI, metastatic disease with multifocal lesions and multifocal positive margins 03/24/22: Re-excision with a few atypical melanocytes, no melanoma and clear margins   04/07/2022 Initial Diagnosis   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 Patient feels ok but complains of a head cold, cough with red/brown phlegm, and watery eyes. He has been taking Mucinex for the last 2-3 days and is also currently on Plavix  75mg . I informed him to look out for yellow or green phlegm and if he develops this or fevers or worsening infection, then to contact my office or his PCP. He informed me that his last CT scan was canceled and we will get that rescheduled. His last PET scan was 01/31/2023 and last CT chest, abdomen, and pelvis was done on 08/16/2022 and I agree that he will still need a repeat CT scan to follow-up on his pulmonary nodules and rule out any other metastatic disease. We will get that scheduled and let him know about the results. His day 1 cycle 20 of Nivolumab  is scheduled on 08/24/2023. He has a elevated WBC of 14.7, hemoglobin of 13.7, and platelet count of 226,000 and a completley normal CMP. TSH and T4 today are pending. I will see him back in 4 weeks with  CBC, CMP, TSH, and T4. He  denies signs of infection such as sore throat or urinary symptoms.  He denies fevers or recurrent chills. He denies pain. He denies nausea, vomiting, chest pain, dyspnea. His appetite is good and his weight has increased 5 pounds over last month .   REVIEW OF SYSTEMS:  Review of Systems  Constitutional: Negative.  Negative for appetite change, chills, diaphoresis, fatigue, fever and unexpected weight change.  HENT:  Negative.  Negative for hearing loss, lump/mass, mouth sores, nosebleeds, sore throat, tinnitus, trouble swallowing and voice change.   Eyes: Negative.  Negative for eye problems and icterus.  Respiratory:  Positive for cough (with red/brown hlegm). Negative for chest tightness, hemoptysis, shortness of breath and wheezing.        Sinus drainage  Cardiovascular: Negative.  Negative for chest pain,  leg swelling and palpitations.  Gastrointestinal: Negative.  Negative for abdominal distention, abdominal pain, blood in stool, constipation, diarrhea, nausea, rectal pain and vomiting.  Endocrine: Negative.  Negative for hot flashes.  Genitourinary: Negative.  Negative for bladder incontinence, difficulty urinating, dyspareunia, dysuria, frequency, hematuria, nocturia, pelvic pain and penile discharge.   Musculoskeletal: Negative.  Negative for arthralgias, back pain, flank pain, gait problem, myalgias, neck pain and neck stiffness.  Skin: Negative.  Negative for itching, rash and wound.  Neurological: Negative.  Negative for dizziness, extremity weakness, gait problem, headaches, light-headedness, numbness, seizures and speech difficulty.  Hematological: Negative.  Negative for adenopathy. Does not bruise/bleed easily.  Psychiatric/Behavioral: Negative.  Negative for confusion, decreased concentration, depression, sleep disturbance and suicidal ideas. The patient is not nervous/anxious.      VITALS:  Blood pressure (!) 154/74, pulse 69, temperature 97.8 F (36.6 C), temperature source  Oral, resp. rate 18, height 5' 9.39 (1.763 m), weight 234 lb (106.1 kg), SpO2 96%.  Wt Readings from Last 3 Encounters:  08/24/23 234 lb (106.1 kg)  07/27/23 229 lb 3.2 oz (104 kg)  06/25/23 230 lb 1.3 oz (104.4 kg)    Body mass index is 34.17 kg/m.  Performance status (ECOG): 1 - Symptomatic but completely ambulatory  PHYSICAL EXAM:  Physical Exam Vitals and nursing note reviewed.  Constitutional:      General: He is not in acute distress.    Appearance: Normal appearance. He is normal weight. He is not ill-appearing, toxic-appearing or diaphoretic.  HENT:     Head: Normocephalic and atraumatic.     Right Ear: Tympanic membrane, ear canal and external ear normal. There is no impacted cerumen.     Left Ear: Tympanic membrane, ear canal and external ear normal. There is no impacted cerumen.     Nose: Nose normal. No congestion or rhinorrhea.     Mouth/Throat:     Mouth: Mucous membranes are moist.     Pharynx: Oropharynx is clear. No oropharyngeal exudate or posterior oropharyngeal erythema.  Eyes:     General: No scleral icterus.       Right eye: No discharge.        Left eye: No discharge.     Extraocular Movements: Extraocular movements intact.     Conjunctiva/sclera: Conjunctivae normal.     Pupils: Pupils are equal, round, and reactive to light.  Cardiovascular:     Rate and Rhythm: Normal rate and regular rhythm.     Pulses: Normal pulses.     Heart sounds: Normal heart sounds. No murmur heard.    No friction rub. No gallop.  Pulmonary:     Effort: Pulmonary effort is normal. No respiratory distress.     Breath sounds: No stridor. Rhonchi present. No wheezing or rales.     Comments: Rare slight rhonchi on the right side Chest:     Chest wall: No tenderness.  Abdominal:     General: Bowel sounds are normal. There is no distension.     Palpations: Abdomen is soft. There is no hepatomegaly, splenomegaly or mass.     Tenderness: There is no abdominal tenderness. There  is no right CVA tenderness, left CVA tenderness, guarding or rebound.     Hernia: No hernia is present.  Musculoskeletal:        General: Normal range of motion.     Cervical back: Normal range of motion and neck supple. No tenderness.     Right lower leg: No edema.  Left lower leg: 1+ Edema (trace) present.  Lymphadenopathy:     Cervical: No cervical adenopathy.     Upper Body:     Right upper body: No supraclavicular or axillary adenopathy.     Left upper body: No supraclavicular or axillary adenopathy.     Lower Body: No right inguinal adenopathy. No left inguinal adenopathy.  Skin:    General: Skin is warm and dry.     Coloration: Skin is not jaundiced.     Findings: No rash. Scaling rash: Scattered benign appearing nevi.     Comments: Large healing scar of the left medial knee without evidence of recurrence. Scattered benign appearing nevi.  Neurological:     General: No focal deficit present.     Mental Status: He is alert and oriented to person, place, and time. Mental status is at baseline.     Cranial Nerves: No cranial nerve deficit.     Sensory: No sensory deficit.     Motor: No weakness.     Coordination: Coordination normal.     Gait: Gait normal.     Deep Tendon Reflexes: Reflexes normal.  Psychiatric:        Mood and Affect: Mood normal.        Behavior: Behavior normal.        Thought Content: Thought content normal.        Judgment: Judgment normal.    LABS:      Latest Ref Rng & Units 08/24/2023    1:38 PM 07/27/2023   10:53 AM 06/21/2023    3:01 PM  CBC  WBC 4.0 - 10.5 K/uL 14.7  11.8  11.9   Hemoglobin 13.0 - 17.0 g/dL 86.2  86.0  85.5   Hematocrit 39.0 - 52.0 % 38.9  40.4  42.9   Platelets 150 - 400 K/uL 226  266  226       Latest Ref Rng & Units 08/24/2023    1:38 PM 07/27/2023   10:53 AM 06/21/2023    3:01 PM  CMP  Glucose 70 - 99 mg/dL 97  84  98   BUN 8 - 23 mg/dL 23  26  27    Creatinine 0.61 - 1.24 mg/dL 8.89  8.78  8.84   Sodium 135 -  145 mmol/L 143  140  140   Potassium 3.5 - 5.1 mmol/L 4.3  4.1  3.9   Chloride 98 - 111 mmol/L 104  103  104   CO2 22 - 32 mmol/L 28  25  23    Calcium 8.9 - 10.3 mg/dL 9.6  9.6  9.1   Total Protein 6.5 - 8.1 g/dL 7.0  7.0  7.2   Total Bilirubin 0.0 - 1.2 mg/dL 0.8  0.7  1.2   Alkaline Phos 38 - 126 U/L 96  95  63   AST 15 - 41 U/L 22  21  21    ALT 0 - 44 U/L 22  16  20      Lab Results  Component Value Date   LDH 140 02/01/2023   LDH 160 02/07/2022   Component Ref Range & Units (hover) 4 wk ago (07/27/23) 2 mo ago (06/21/23) 3 mo ago (05/24/23) 4 mo ago (04/26/23) 4 mo ago (03/29/23) 5 mo ago (03/01/23) 6 mo ago (02/01/23)  T4, Total 7.4 8.2 CM 7.5 CM 7.5 CM 7.6 CM 7.0 CM 7.2 CM   Component Ref Range & Units (hover) 4 wk ago (07/27/23) 2 mo ago (06/21/23) 3 mo  ago (05/24/23) 4 mo ago (04/26/23) 4 mo ago (03/29/23) 5 mo ago (03/01/23) 6 mo ago (02/01/23)  TSH 0.912 0.855 CM 0.844 CM 1.511 CM 1.189 CM 1.157 CM 0.971 CM    STUDIES:  No results found.   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 , 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     Past Surgical History:  Procedure Laterality Date   LUMBAR LAMINECTOMY/DECOMPRESSION MICRODISCECTOMY N/A 01/26/2022   Procedure: Microlumbar decompression Lumbar four-five, lateral mass fusion with autograft and allograft bone;  Surgeon: Duwayne Purchase, MD;  Location: [facility] OR;  Service: Orthopedics;  Laterality: N/A;   PARTIAL GASTRECTOMY     TOTAL HIP ARTHROPLASTY Right 06/18/2018   Procedure: RIGHT TOTAL HIP ARTHROPLASTY ANTERIOR APPROACH;  Surgeon: Ernie Cough, MD;  Location: [facility] ORS;  Service:  Orthopedics;  Laterality: Right;    Family History  Problem Relation Age of Onset   Lung cancer Sister    Melanoma Brother     Social History:  reports that he has quit smoking. He has never used smokeless tobacco. He reports that he does not currently use alcohol. He reports that he does not use drugs.The patient is alone today.  Allergies:  Allergies  Allergen Reactions   Morphine Itching    Patient states it is tolerable itching    Current Medications: Current Outpatient Medications  Medication Sig Dispense Refill   amLODipine  (NORVASC ) 5 MG tablet Take 5 mg by mouth daily.     Calcium Carbonate (CALCIUM 600 PO) Take 1 tablet by mouth 2 (two) times daily.     clopidogrel  (PLAVIX ) 75 MG tablet Take 1 tablet by mouth daily.     docusate sodium  (COLACE) 100 MG capsule 1 capsule 2 TIMES DAILY (route: oral)     famotidine  (PEPCID ) 40 MG tablet TAKE 1 TABLET EVERY DAY 90 tablet 3   folic acid  (FOLVITE ) 400 MCG tablet Take 400 mcg by mouth daily.     gabapentin  (NEURONTIN ) 300 MG capsule Take 1 capsule (300 mg total) by mouth 3 (three) times daily. 270 capsule 3   hydrochlorothiazide  (HYDRODIURIL ) 12.5 MG tablet Take 12.5 mg by mouth daily.     lisinopril  (ZESTRIL ) 20 MG tablet      Multiple Vitamin (MULTIVITAMIN WITH MINERALS) TABS tablet Take 1 tablet by mouth in the morning.     mupirocin ointment (BACTROBAN) 2 % Apply topically.     neomycin -polymyxin-hydrocortisone (CORTISPORIN) OTIC solution Place 3 drops into the right ear 4 (four) times daily. 10 mL 1   Omega-3 1000 MG CAPS Take by mouth.     ondansetron  (ZOFRAN ) 4 MG tablet Take 1 tablet (4 mg total) by mouth every 4 (four) hours as needed for nausea. 90 tablet 3   oxyCODONE  (OXY IR/ROXICODONE ) 5 MG immediate release tablet Take 1 tablet (5 mg total) by mouth every 6 (six) hours as needed for severe pain. (Patient not taking: Reported on 07/27/2023) 30 tablet 0   polyethylene glycol (MIRALAX  / GLYCOLAX ) 17 g packet Take 17 g  by mouth daily.  14 each 0   pravastatin  (PRAVACHOL ) 40 MG tablet Take 40 mg by mouth daily.     prochlorperazine  (COMPAZINE ) 10 MG tablet Take 1 tablet (10 mg total) by mouth every 6 (six) hours as needed for nausea or vomiting. 90 tablet 3   traZODone  (DESYREL ) 50 MG tablet TAKE 1 TABLET AT BEDTIME AS NEEDED FOR SLEEP 30 tablet 11   triamcinolone  cream (KENALOG ) 0.1 % Apply topically 2 (two) times daily.     No current facility-administered medications for this visit.    I,Jasmine M Lassiter,acting as a scribe for [name], MD.,have documented all relevant documentation on the behalf of [name], MD,as directed by  [name], MD while in the presence of [name], MD.

## 2023-08-24 NOTE — Telephone Encounter (Signed)
 Patient has been scheduled for follow-up visit per 08/24/23 LOS.  Pt given an appt calendar with date and time.

## 2023-08-24 NOTE — Patient Instructions (Signed)
 Nivolumab Injection What is this medication? NIVOLUMAB (nye VOL ue mab) treats some types of cancer. It works by helping your immune system slow or stop the spread of cancer cells. It is a monoclonal antibody. This medicine may be used for other purposes; ask your health care provider or pharmacist if you have questions. COMMON BRAND NAME(S): Opdivo What should I tell my care team before I take this medication? They need to know if you have any of these conditions: Allogeneic stem cell transplant (uses someone else's stem cells) Autoimmune diseases, such as Crohn disease, ulcerative colitis, lupus History of chest radiation Nervous system problems, such as Guillain-Barre syndrome or myasthenia gravis Organ transplant An unusual or allergic reaction to nivolumab, other medications, foods, dyes, or preservatives Pregnant or trying to get pregnant Breast-feeding How should I use this medication? This medication is infused into a vein. It is given in a hospital or clinic setting. A special MedGuide will be given to you before each treatment. Be sure to read this information carefully each time. Talk to your care team about the use of this medication in children. While it may be prescribed for children as young as 12 years for selected conditions, precautions do apply. Overdosage: If you think you have taken too much of this medicine contact a poison control center or emergency room at once. NOTE: This medicine is only for you. Do not share this medicine with others. What if I miss a dose? Keep appointments for follow-up doses. It is important not to miss your dose. Call your care team if you are unable to keep an appointment. What may interact with this medication? Interactions have not been studied. This list may not describe all possible interactions. Give your health care provider a list of all the medicines, herbs, non-prescription drugs, or dietary supplements you use. Also tell them if you  smoke, drink alcohol, or use illegal drugs. Some items may interact with your medicine. What should I watch for while using this medication? Your condition will be monitored carefully while you are receiving this medication. You may need blood work while taking this medication. This medication may cause serious skin reactions. They can happen weeks to months after starting the medication. Contact your care team right away if you notice fevers or flu-like symptoms with a rash. The rash may be red or purple and then turn into blisters or peeling of the skin. You may also notice a red rash with swelling of the face, lips, or lymph nodes in your neck or under your arms. Tell your care team right away if you have any change in your eyesight. Talk to your care team if you are pregnant or think you might be pregnant. A negative pregnancy test is required before starting this medication. A reliable form of contraception is recommended while taking this medication and for 5 months after the last dose. Talk to your care team about effective forms of contraception. Do not breast-feed while taking this medication and for 5 months after the last dose. What side effects may I notice from receiving this medication? Side effects that you should report to your care team as soon as possible: Allergic reactions--skin rash, itching, hives, swelling of the face, lips, tongue, or throat Dry cough, shortness of breath or trouble breathing Eye pain, redness, irritation, or discharge with blurry or decreased vision Heart muscle inflammation--unusual weakness or fatigue, shortness of breath, chest pain, fast or irregular heartbeat, dizziness, swelling of the ankles, feet, or hands Hormone  gland problems--headache, sensitivity to light, unusual weakness or fatigue, dizziness, fast or irregular heartbeat, increased sensitivity to cold or heat, excessive sweating, constipation, hair loss, increased thirst or amount of urine,  tremors or shaking, irritability Infusion reactions--chest pain, shortness of breath or trouble breathing, feeling faint or lightheaded Kidney injury (glomerulonephritis)--decrease in the amount of urine, red or dark brown urine, foamy or bubbly urine, swelling of the ankles, hands, or feet Liver injury--right upper belly pain, loss of appetite, nausea, light-colored stool, dark yellow or brown urine, yellowing skin or eyes, unusual weakness or fatigue Pain, tingling, or numbness in the hands or feet, muscle weakness, change in vision, confusion or trouble speaking, loss of balance or coordination, trouble walking, seizures Rash, fever, and swollen lymph nodes Redness, blistering, peeling, or loosening of the skin, including inside the mouth Sudden or severe stomach pain, bloody diarrhea, fever, nausea, vomiting Side effects that usually do not require medical attention (report these to your care team if they continue or are bothersome): Bone, joint, or muscle pain Diarrhea Fatigue Loss of appetite Nausea Skin rash This list may not describe all possible side effects. Call your doctor for medical advice about side effects. You may report side effects to FDA at 1-800-FDA-1088. Where should I keep my medication? This medication is given in a hospital or clinic. It will not be stored at home. NOTE: This sheet is a summary. It may not cover all possible information. If you have questions about this medicine, talk to your doctor, pharmacist, or health care provider.  2024 [employer] (2021-12-05 00:00:00)

## 2023-08-25 LAB — T4: T4, Total: 7.8 ug/dL (ref 4.5–12.0)

## 2023-08-26 ENCOUNTER — Other Ambulatory Visit: Payer: Self-pay

## 2023-08-29 ENCOUNTER — Encounter: Payer: Self-pay | Admitting: Oncology

## 2023-09-07 DIAGNOSIS — C439 Malignant melanoma of skin, unspecified: Secondary | ICD-10-CM | POA: Diagnosis not present

## 2023-09-07 DIAGNOSIS — R918 Other nonspecific abnormal finding of lung field: Secondary | ICD-10-CM | POA: Diagnosis not present

## 2023-09-13 ENCOUNTER — Encounter: Payer: Self-pay | Admitting: Oncology

## 2023-09-18 ENCOUNTER — Telehealth: Payer: Self-pay

## 2023-09-18 NOTE — Telephone Encounter (Signed)
-----   Message from [name] sent at 09/14/2023  7:36 PM EST ----- Regarding: call Tell him scans look good, will go over the details at his appt

## 2023-09-18 NOTE — Telephone Encounter (Signed)
Called patient and notified him of the message

## 2023-09-20 NOTE — Progress Notes (Shared)
[facility]  [address] [place],  [place]  [address] [phone]  Clinic Day:  09/21/23   Referring physician: [name], MD  ASSESSMENT & PLAN:  Assessment: Malignant melanoma of the left foot He had a punch biopsy and this reveals a Breslow thickness of 1.6 mm and Clark's level 4 with ulceration for a T2b N0 M0 clinically.  We found no distant metastasis on PET scan, but he did have metastasis to the left lower extremity.  His tumor is positive for BRAF and PDL1 mutation. He had this treated with wide local excision.   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   Multiple lesions of the lung These were suspicious for metastatic disease but resolved on the CT scan in December 2023. PET scan in June was negative, but we cannot absolutely rule out metastatic disease since these lesions are small. We discussed the fact that the resolution of these lesions could represent a response to treatment, or may not have been malignant at all.  We think it is more likely the former, and so I recommend continuation of therapy.  We have had several discussions about this.   Spinal stenosis at L4-5 He has had surgical decompression and resection of a synovial cyst in June 2023.   Leukocytosis He has had chronic leukocytosis and we did not find an explanation but the [facility] continue to fluctuate up and down. His WBC is higher than usual probably related to his upper respiratory viral infection.    Lymphedema of the Left Lower Extremity We discussed this diagnosis and the limited approaches available. This is improved.  Plan: He had a CT chest, abdomen, and pelvis done on 09/07/2023 that revealed previously present multitude of the right middle lobe pulmonary micro nodule and nodules have  essentially resolved, with a 3mm residual nodule likely represent sequela of infectious or inflammatory process and no evidence of metastatic disease to the thorax, abdomen, or pelvis.  A 3.22mm left lower lobe pulmonary nodule not visualized on the prior CT examination likely represents a similar process. His day 1 cycle 21 of Nivolumab is scheduled on 09/20/2022. He has a elevated WBC of 10.9 down from 14.7, hemoglobin of 14.3, and platelet count of 229,000. His CMP is normal other than a elevated BUN of 25. His TSH and T4 are pending. I will see him back in 4 weeks with CBC, CMP, TSH, and T4. The patient understands the plans discussed today and is in agreement with them.  He knows to contact our office if he develops concerns prior to his next appointment.   I provided 10 minutes of face-to-face time during this encounter and > 50% was spent counseling as documented under my assessment and plan.   Jasmine M Lassiter  [facility] [place] - A DEPT OF [facility] [address] [place] [address] Dept: [phone] Dept Fax: [phone]   No orders of the defined types were placed in this encounter.   CHIEF COMPLAINT:  CC: Malignant melanoma   Current Treatment:  Evaluation and immunotherapy   HISTORY OF PRESENT ILLNESS:  Howard Bunte is a 78 y.o. male with a history of malignant melanoma who is referred in consultation with Dr. Luna Kitchens for assessment and management.  He  had a lesion of the dorsum of the left foot in September 2022 which appeared to be a granuloma and was treated in the office.  He later developed a another lesion lateral to this on the dorsum of the left foot which was not hyperpigmented but did bleed easily.  He also has several lesions of his left medial calf which are nodular and span approximately 6 cm.  He has had some increased edema of the left lower leg.  He was evaluated by dermatology on June 5, who felt this could be  a squamous cell carcinoma versus tinea or a drug reaction.  He had biopsies done of both the foot lesion and the Lesion.  Pathology came back that the left dorsal foot shave biopsy revealed a malignant melanoma possibly a primary nodular ulcerated melanoma which had a Breslow thickness of at least 1.6 mm and a Clark's level 4.  Margins are positive as this was a shave biopsy.  Ulceration is present but no satellitosis.  The mitotic index is 4/mm and no lymphovascular invasion was identified.  No brisk tumor infiltrating lymphocytes are seen and tumor regression is absent.  This is felt to be at least a T2b lesion and wide excision was recommended.  The lesion of the left medial calf revealed a dermal map melanoma most consistent with metastatic disease. BRAF and PDL1 are positive.  This information was not available when the patient was admitted for back surgery on June 8 and he had a microlumbar decompression at L4-5 for spinal stenosis.  He was also found to have a synovial cyst at that time which was resected and benign.  He was readmitted several days later for acute pain of the right elbow and both arms.  Aspiration was obtained and this was diagnosed as gouty arthritis.  He did have temporary rehab after his back surgery.  His left leg was evaluated for deep venous thrombosis and was negative.  While he was recuperating from his surgery he had an x-ray which was abnormal which led to a CT scan which revealed at least 3 lung lesions.  MRI of the brain was negative and he is now referred for management of the malignant melanoma. PET scan on 01/31/2023  revealed interval resolution of previously demonstrated hypermetabolic activity within the left foot, no evidence of local recurrence of metastatic disease, resolution of right middle lobe airspace diseases seen on most recent CT. No suspicious pulmonary nodularity, new opacification of the maxillary sinus with mild peripheral hypermetabolic activity, likely  inflammatory, and aortic atherosclerosis.    Oncology History  Malignant melanoma, metastatic (HCC)  01/23/2022 Cancer Staging   Staging form: Melanoma of the Skin, AJCC 8th Edition - Clinical stage from 01/23/2022: Stage III (cT2b, cN1c, cM0) - Signed by [name], MD on 04/07/2022 Histopathologic type: Malignant melanoma, NOS (except juvenile melanoma M-8770/[id number]) Stage prefix: Initial diagnosis Laterality: Left Lymph-vascular invasion (LVI): LVI not present (absent)/not identified Diagnostic confirmation: Positive histology Specimen type: Biopsy / Limited Resection Staged by: Managing physician Mitotic count: 4 Mitotic unit: mm2 Clark's level: Level IV Tumor-infiltrating lymphocytes: Present and non-brisk Breslow depth (mm): 16 Ulceration of the epidermis: Yes Microsatellites: No Primary tumor regression: Absent Intransit/satellite metastasis: In-transit Lymph node clinically or radiologically detected: No Microscopic confirmation of metastasis: No Sentinel lymph node biopsy performed: No Completion or therapeutic lymph node dissection performed: No Matted nodes: No Stage used in treatment planning: Yes National guidelines used in treatment planning: Yes Type of national guideline used in treatment  planning: NCCN   01/31/2022 Initial Diagnosis   Malignant melanoma, metastatic (HCC)   04/14/2022 -  Chemotherapy   Patient is on Treatment Plan : MELANOMA Nivolumab (480) q28d (changed from q14d on 12/4)     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)  02/28/2022 Cancer Staging   Staging form: Melanoma of the Skin, AJCC 8th Edition - Clinical stage from 02/28/2022: Stage IV (cT2b(m), cN1c, cM1a) - Signed by [name], MD on 04/07/2022 Histopathologic type: Malignant melanoma, NOS (except juvenile melanoma M-8770/[id number]) Stage prefix: Initial diagnosis Laterality: Left Multiple tumors: Yes Lymph-vascular invasion (LVI): LVI present/identified, NOS Diagnostic  confirmation: Positive histology Specimen type: Excision Staged by: Managing physician Intransit/satellite metastasis: Both in-transit and satellite Prognostic indicators: 02/28/22:  Wide excision of primary of foot - no residual melanoma,  Excision of skin of calf consistent with intransit mets of dermal/subcuticular   Melanoma with LVI, metastatic disease with multifocal lesions and multifocal positive margins 03/24/22: Re-excision with a few atypical melanocytes, no melanoma and clear margins   04/07/2022 Initial Diagnosis   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 Patient states that he feels well and has no complaints of pain. He had a CT chest, abdomen, and pelvis done on 09/07/2023 that revealed previously present multitude of the right middle lobe pulmonary micro nodule and nodules have essentially resolved, with a 3mm residual nodule likely represent sequela of infectious or inflammatory process and no evidence of metastatic disease to the thorax, abdomen, or pelvis.  A 3.91mm left lower lobe pulmonary nodule not visualized on the prior CT examination likely represents a similar process. His day 1 cycle 21 of Nivolumab is scheduled on 09/20/2022. He has a elevated WBC of 10.9 down from 14.7, hemoglobin of 14.3, and platelet count of 229,000. His CMP is normal other than a elevated BUN of 25. His TSH and T4 are pending. I will see him back in 4 weeks with CBC, CMP, TSH, and T4.   He denies signs of infection such as sore throat, sinus drainage, cough, or urinary symptoms.  He denies fevers or recurrent chills. He denies pain. He denies nausea, vomiting, chest pain, dyspnea or cough. His appetite is good and his weight has decreased 2 pounds over last 4 weeks .   REVIEW OF  SYSTEMS:  Review of Systems  Constitutional: Negative.  Negative for appetite change, chills, diaphoresis, fatigue, fever and unexpected weight change.  HENT:  Negative.  Negative for hearing loss, lump/mass, mouth sores, nosebleeds, sore throat, tinnitus, trouble swallowing and voice change.   Eyes: Negative.  Negative for eye problems and icterus.  Respiratory:  Negative for chest tightness, cough, hemoptysis, shortness of breath and wheezing.   Cardiovascular: Negative.  Negative for chest pain, leg swelling and palpitations.  Gastrointestinal: Negative.  Negative for abdominal distention, abdominal pain, blood in stool, constipation, diarrhea, nausea, rectal pain and vomiting.  Endocrine: Negative.  Negative for hot flashes.  Genitourinary: Negative.  Negative for bladder incontinence, difficulty urinating, dyspareunia, dysuria, frequency, hematuria, nocturia, pelvic pain and penile discharge.   Musculoskeletal: Negative.  Negative for arthralgias, back pain, flank pain, gait problem, myalgias, neck pain and neck stiffness.  Skin: Negative.  Negative for itching, rash and wound.  Neurological: Negative.  Negative for dizziness, extremity weakness,  gait problem, headaches, light-headedness, numbness, seizures and speech difficulty.  Hematological: Negative.  Negative for adenopathy. Does not bruise/bleed easily.  Psychiatric/Behavioral: Negative.  Negative for confusion, decreased concentration, depression, sleep disturbance and suicidal ideas. The patient is not nervous/anxious.      VITALS:  Blood pressure (!) 148/74, pulse 61, temperature 98.2 F (36.8 C), temperature source Oral, resp. rate 16, height 5' 9.39" (1.763 m), weight 232 lb 1.6 oz (105.3 kg), SpO2 98%.  Wt Readings from Last 3 Encounters:  09/21/23 232 lb 1.6 oz (105.3 kg)  08/24/23 234 lb (106.1 kg)  07/27/23 229 lb 3.2 oz (104 kg)    Body mass index is 33.89 kg/m.  Performance status (ECOG): 1 - Symptomatic but  completely ambulatory  PHYSICAL EXAM:  Physical Exam Vitals and nursing note reviewed.  Constitutional:      General: He is not in acute distress.    Appearance: Normal appearance. He is normal weight. He is not ill-appearing, toxic-appearing or diaphoretic.  HENT:     Head: Normocephalic and atraumatic.     Right Ear: Tympanic membrane, ear canal and external ear normal. There is no impacted cerumen.     Left Ear: Tympanic membrane, ear canal and external ear normal. There is no impacted cerumen.     Nose: Nose normal. No congestion or rhinorrhea.     Mouth/Throat:     Mouth: Mucous membranes are moist.     Pharynx: Oropharynx is clear. No oropharyngeal exudate or posterior oropharyngeal erythema.  Eyes:     General: No scleral icterus.       Right eye: No discharge.        Left eye: No discharge.     Extraocular Movements: Extraocular movements intact.     Conjunctiva/sclera: Conjunctivae normal.     Pupils: Pupils are equal, round, and reactive to light.  Cardiovascular:     Rate and Rhythm: Normal rate and regular rhythm.     Pulses: Normal pulses.     Heart sounds: Normal heart sounds. No murmur heard.    No friction rub. No gallop.  Pulmonary:     Effort: Pulmonary effort is normal. No respiratory distress.     Breath sounds: No stridor. No wheezing, rhonchi or rales.  Chest:     Chest wall: No tenderness.  Abdominal:     General: Bowel sounds are normal. There is no distension.     Palpations: Abdomen is soft. There is no hepatomegaly, splenomegaly or mass.     Tenderness: There is no abdominal tenderness. There is no right CVA tenderness, left CVA tenderness, guarding or rebound.     Hernia: No hernia is present.  Musculoskeletal:        General: Normal range of motion.     Cervical back: Normal range of motion and neck supple. No tenderness.     Right lower leg: No edema.     Left lower leg: 1+ Edema (trace) present.  Lymphadenopathy:     Cervical: No cervical  adenopathy.     Upper Body:     Right upper body: No supraclavicular or axillary adenopathy.     Left upper body: No supraclavicular or axillary adenopathy.     Lower Body: No right inguinal adenopathy. No left inguinal adenopathy.  Skin:    General: Skin is warm and dry.     Coloration: Skin is not jaundiced.     Findings: No rash. Scaling rash: Scattered benign appearing nevi.     Comments: Large healing  scar of the left medial knee without evidence of recurrence. Scattered benign appearing nevi.  Neurological:     General: No focal deficit present.     Mental Status: He is alert and oriented to person, place, and time. Mental status is at baseline.     Cranial Nerves: No cranial nerve deficit.     Sensory: No sensory deficit.     Motor: No weakness.     Coordination: Coordination normal.     Gait: Gait normal.     Deep Tendon Reflexes: Reflexes normal.  Psychiatric:        Mood and Affect: Mood normal.        Behavior: Behavior normal.        Thought Content: Thought content normal.        Judgment: Judgment normal.    LABS:      Latest Ref Rng & Units 09/21/2023   11:00 AM 08/24/2023    1:38 PM 07/27/2023   10:53 AM  CBC  WBC 4.0 - 10.5 K/uL 10.9  14.7  11.8   Hemoglobin 13.0 - 17.0 g/dL 81.1  91.4  78.2   Hematocrit 39.0 - 52.0 % 41.3  38.9  40.4   Platelets 150 - 400 K/uL 229  226  266       Latest Ref Rng & Units 09/21/2023   11:00 AM 08/24/2023    1:38 PM 07/27/2023   10:53 AM  CMP  Glucose 70 - 99 mg/dL 99  97  84   BUN 8 - 23 mg/dL 25  23  26    Creatinine 0.61 - 1.24 mg/dL 9.56  2.13  0.86   Sodium 135 - 145 mmol/L 138  143  140   Potassium 3.5 - 5.1 mmol/L 4.3  4.3  4.1   Chloride 98 - 111 mmol/L 101  104  103   CO2 22 - 32 mmol/L 26  28  25    Calcium 8.9 - 10.3 mg/dL 9.5  9.6  9.6   Total Protein 6.5 - 8.1 g/dL 7.0  7.0  7.0   Total Bilirubin 0.0 - 1.2 mg/dL 1.2  0.8  0.7   Alkaline Phos 38 - 126 U/L 91  96  95   AST 15 - 41 U/L 17  22  21    ALT 0 - 44 U/L  13  22  16     Lab Results  Component Value Date   LDH 140 02/01/2023   LDH 160 02/07/2022   Lab Results  Component Value Date   TSH 1.243 09/21/2023   T4TOTAL 7.8 08/24/2023    Component Ref Range & Units (hover) 4 wk ago (07/27/23) 2 mo ago (06/21/23) 3 mo ago (05/24/23) 4 mo ago (04/26/23) 4 mo ago (03/29/23) 5 mo ago (03/01/23) 6 mo ago (02/01/23)  T4, Total 7.4 8.2 CM 7.5 CM 7.5 CM 7.6 CM 7.0 CM 7.2 CM   Component Ref Range & Units (hover) 4 wk ago (07/27/23) 2 mo ago (06/21/23) 3 mo ago (05/24/23) 4 mo ago (04/26/23) 4 mo ago (03/29/23) 5 mo ago (03/01/23) 6 mo ago (02/01/23)  TSH 0.912 0.855 CM 0.844 CM 1.511 CM 1.189 CM 1.157 CM 0.971 CM    STUDIES:    HISTORY:   Past Medical History:  Diagnosis Date   Arthritis    gouty arthritis June 2023   History of colon polyps    History of kidney stones    Hypercholesteremia    Hypertension    Leukocytosis 06/16/2022  Malignant melanoma (HCC)    of the left foot with in-transit metastases   Peptic ulcer    Spinal stenosis at L4-L5 level    Stroke [facility])    CVA, 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 , 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     Past Surgical History:  Procedure Laterality Date   LUMBAR LAMINECTOMY/DECOMPRESSION MICRODISCECTOMY N/A 01/26/2022   Procedure: Microlumbar decompression Lumbar four-five, lateral mass fusion with autograft and allograft bone;  Surgeon: Jene Every, MD;  Location: [facility] OR;  Service: Orthopedics;  Laterality: N/A;   PARTIAL GASTRECTOMY     TOTAL HIP ARTHROPLASTY Right 06/18/2018   Procedure: RIGHT TOTAL HIP ARTHROPLASTY ANTERIOR APPROACH;  Surgeon: Durene Romans, MD;  Location: [facility] ORS;  Service: Orthopedics;  Laterality: Right;    Family History  Problem Relation Age of Onset   Lung cancer Sister    Melanoma Brother     Social History:  reports that he has quit smoking. He has never used smokeless tobacco. He reports that he does not currently use alcohol. He reports that he does  not use drugs.The patient is alone today.  Allergies:  Allergies  Allergen Reactions   Morphine Itching    Patient states it is tolerable itching    Current Medications: Current Outpatient Medications  Medication Sig Dispense Refill   amLODipine (NORVASC) 5 MG tablet Take 5 mg by mouth daily.     Calcium Carbonate (CALCIUM 600 PO) Take 1 tablet by mouth 2 (two) times daily.     clopidogrel (PLAVIX) 75 MG tablet Take 1 tablet by mouth daily.     docusate sodium (COLACE) 100 MG capsule 1 capsule 2 TIMES DAILY (route: oral)     famotidine (PEPCID) 40 MG tablet TAKE 1 TABLET EVERY DAY 90 tablet 3   folic acid (FOLVITE) 400 MCG tablet Take 400 mcg by mouth daily.     gabapentin (NEURONTIN) 300 MG capsule Take 1 capsule (300 mg total) by mouth 3 (three) times daily. 270 capsule 3   hydrochlorothiazide (HYDRODIURIL) 12.5 MG tablet Take 12.5 mg by mouth daily.     lisinopril (ZESTRIL) 20 MG tablet      Multiple Vitamin (MULTIVITAMIN WITH MINERALS) TABS tablet Take 1 tablet by mouth in the morning.     mupirocin ointment (BACTROBAN) 2 % Apply topically.     neomycin-polymyxin-hydrocortisone (CORTISPORIN) OTIC solution Place 3 drops into the right ear 4 (four) times daily. 10 mL 1   Omega-3 1000 MG CAPS Take by mouth.     ondansetron (ZOFRAN) 4 MG tablet Take 1 tablet (4 mg total) by mouth every 4 (four) hours as needed for nausea. 90 tablet 3   oxyCODONE (OXY IR/ROXICODONE) 5 MG immediate release tablet Take 1 tablet (5 mg total) by mouth every 6 (six) hours as needed for severe pain. (Patient not taking: Reported on 07/27/2023) 30 tablet 0   polyethylene glycol (MIRALAX / GLYCOLAX) 17 g packet Take 17 g by mouth daily. 14 each 0   pravastatin (PRAVACHOL) 40 MG tablet Take 40 mg by mouth daily.     prochlorperazine (COMPAZINE) 10 MG tablet Take 1 tablet (10 mg total) by mouth every 6 (six) hours as needed for nausea or vomiting. 90 tablet 3   traZODone (DESYREL) 50 MG tablet TAKE 1 TABLET AT  BEDTIME AS NEEDED FOR SLEEP 30 tablet 11   triamcinolone cream (KENALOG) 0.1 % Apply topically 2 (two) times daily.     No current facility-administered medications for this visit.  Facility-Administered Medications Ordered in Other Visits  Medication Dose Route Frequency Provider Last Rate Last Admin   sodium chloride flush (NS) 0.9 % injection 10 mL  10 mL Intracatheter PRN [name], MD   10 mL at 09/21/23 1342    I,Jasmine M Lassiter,acting as a scribe for [name], MD.,have documented all relevant documentation on the behalf of [name], MD,as directed by  [name], MD while in the presence of [name], MD.

## 2023-09-21 ENCOUNTER — Inpatient Hospital Stay: Payer: Medicare HMO

## 2023-09-21 ENCOUNTER — Encounter: Payer: Self-pay | Admitting: Oncology

## 2023-09-21 ENCOUNTER — Inpatient Hospital Stay (HOSPITAL_BASED_OUTPATIENT_CLINIC_OR_DEPARTMENT_OTHER): Payer: Medicare HMO | Admitting: Oncology

## 2023-09-21 ENCOUNTER — Other Ambulatory Visit: Payer: Self-pay | Admitting: Oncology

## 2023-09-21 VITALS — BP 148/74 | HR 61 | Temp 98.2°F | Resp 16 | Ht 69.39 in | Wt 232.1 lb

## 2023-09-21 DIAGNOSIS — C4372 Malignant melanoma of left lower limb, including hip: Secondary | ICD-10-CM | POA: Diagnosis not present

## 2023-09-21 DIAGNOSIS — C792 Secondary malignant neoplasm of skin: Secondary | ICD-10-CM

## 2023-09-21 DIAGNOSIS — C439 Malignant melanoma of skin, unspecified: Secondary | ICD-10-CM

## 2023-09-21 DIAGNOSIS — Z5112 Encounter for antineoplastic immunotherapy: Secondary | ICD-10-CM | POA: Diagnosis not present

## 2023-09-21 DIAGNOSIS — Z79899 Other long term (current) drug therapy: Secondary | ICD-10-CM | POA: Diagnosis not present

## 2023-09-21 LAB — CBC WITH DIFFERENTIAL (CANCER CENTER ONLY)
Abs Immature Granulocytes: 0.03 10*3/uL (ref 0.00–0.07)
Basophils Absolute: 0 10*3/uL (ref 0.0–0.1)
Basophils Relative: 0 %
Eosinophils Absolute: 0.3 10*3/uL (ref 0.0–0.5)
Eosinophils Relative: 3 %
HCT: 41.3 % (ref 39.0–52.0)
Hemoglobin: 14.3 g/dL (ref 13.0–17.0)
Immature Granulocytes: 0 %
Lymphocytes Relative: 25 %
Lymphs Abs: 2.7 10*3/uL (ref 0.7–4.0)
MCH: 29.8 pg (ref 26.0–34.0)
MCHC: 34.6 g/dL (ref 30.0–36.0)
MCV: 86 fL (ref 80.0–100.0)
Monocytes Absolute: 0.9 10*3/uL (ref 0.1–1.0)
Monocytes Relative: 9 %
Neutro Abs: 6.8 10*3/uL (ref 1.7–7.7)
Neutrophils Relative %: 63 %
Platelet Count: 229 10*3/uL (ref 150–400)
RBC: 4.8 MIL/uL (ref 4.22–5.81)
RDW: 13.4 % (ref 11.5–15.5)
WBC Count: 10.9 10*3/uL — ABNORMAL HIGH (ref 4.0–10.5)
nRBC: 0 % (ref 0.0–0.2)
nRBC: 0 /100{WBCs}

## 2023-09-21 LAB — CMP (CANCER CENTER ONLY)
ALT: 13 U/L (ref 0–44)
AST: 17 U/L (ref 15–41)
Albumin: 4.5 g/dL (ref 3.5–5.0)
Alkaline Phosphatase: 91 U/L (ref 38–126)
Anion gap: 11 (ref 5–15)
BUN: 25 mg/dL — ABNORMAL HIGH (ref 8–23)
CO2: 26 mmol/L (ref 22–32)
Calcium: 9.5 mg/dL (ref 8.9–10.3)
Chloride: 101 mmol/L (ref 98–111)
Creatinine: 1.2 mg/dL (ref 0.61–1.24)
GFR, Estimated: >60 mL/min (ref >60)
Glucose, Bld: 99 mg/dL (ref 70–99)
Potassium: 4.3 mmol/L (ref 3.5–5.1)
Sodium: 138 mmol/L (ref 135–145)
Total Bilirubin: 1.2 mg/dL (ref 0.0–1.2)
Total Protein: 7 g/dL (ref 6.5–8.1)

## 2023-09-21 LAB — TSH: TSH: 1.243 u[IU]/mL (ref 0.350–4.500)

## 2023-09-21 MED ORDER — SODIUM CHLORIDE 0.9% FLUSH
10.0000 mL | INTRAVENOUS | Status: DC | PRN
  Administered 2023-09-21: 10 mL

## 2023-09-21 MED ORDER — HEPARIN SOD (PORK) LOCK FLUSH 100 UNIT/ML IV SOLN
500.0000 [IU] | Freq: Once | INTRAVENOUS | Status: AC | PRN
  Administered 2023-09-21: 500 [IU]

## 2023-09-21 MED ORDER — SODIUM CHLORIDE 0.9 % IV SOLN: Freq: Once | INTRAVENOUS | Status: AC

## 2023-09-21 MED ORDER — NIVOLUMAB CHEMO INJECTION 240 MG/24ML
480.0000 mg | Freq: Once | INTRAVENOUS | Status: AC
  Administered 2023-09-21: 480 mg via INTRAVENOUS
  Filled 2023-09-21: qty 48

## 2023-09-21 NOTE — Patient Instructions (Signed)
Nivolumab Injection What is this medication? NIVOLUMAB (nye VOL ue mab) treats some types of cancer. It works by helping your immune system slow or stop the spread of cancer cells. It is a monoclonal antibody. This medicine may be used for other purposes; ask your health care provider or pharmacist if you have questions. COMMON BRAND NAME(S): Opdivo What should I tell my care team before I take this medication? They need to know if you have any of these conditions: Allogeneic stem cell transplant (uses someone else's stem cells) Autoimmune diseases, such as Crohn disease, ulcerative colitis, lupus History of chest radiation Nervous system problems, such as Guillain-Barre syndrome or myasthenia gravis Organ transplant An unusual or allergic reaction to nivolumab, other medications, foods, dyes, or preservatives Pregnant or trying to get pregnant Breast-feeding How should I use this medication? This medication is infused into a vein. It is given in a hospital or clinic setting. A special MedGuide will be given to you before each treatment. Be sure to read this information carefully each time. Talk to your care team about the use of this medication in children. While it may be prescribed for children as young as 12 years for selected conditions, precautions do apply. Overdosage: If you think you have taken too much of this medicine contact a poison control center or emergency room at once. NOTE: This medicine is only for you. Do not share this medicine with others. What if I miss a dose? Keep appointments for follow-up doses. It is important not to miss your dose. Call your care team if you are unable to keep an appointment. What may interact with this medication? Interactions have not been studied. This list may not describe all possible interactions. Give your health care provider a list of all the medicines, herbs, non-prescription drugs, or dietary supplements you use. Also tell them if you  smoke, drink alcohol, or use illegal drugs. Some items may interact with your medicine. What should I watch for while using this medication? Your condition will be monitored carefully while you are receiving this medication. You may need blood work while taking this medication. This medication may cause serious skin reactions. They can happen weeks to months after starting the medication. Contact your care team right away if you notice fevers or flu-like symptoms with a rash. The rash may be red or purple and then turn into blisters or peeling of the skin. You may also notice a red rash with swelling of the face, lips, or lymph nodes in your neck or under your arms. Tell your care team right away if you have any change in your eyesight. Talk to your care team if you are pregnant or think you might be pregnant. A negative pregnancy test is required before starting this medication. A reliable form of contraception is recommended while taking this medication and for 5 months after the last dose. Talk to your care team about effective forms of contraception. Do not breast-feed while taking this medication and for 5 months after the last dose. What side effects may I notice from receiving this medication? Side effects that you should report to your care team as soon as possible: Allergic reactions--skin rash, itching, hives, swelling of the face, lips, tongue, or throat Dry cough, shortness of breath or trouble breathing Eye pain, redness, irritation, or discharge with blurry or decreased vision Heart muscle inflammation--unusual weakness or fatigue, shortness of breath, chest pain, fast or irregular heartbeat, dizziness, swelling of the ankles, feet, or hands Hormone   gland problems--headache, sensitivity to light, unusual weakness or fatigue, dizziness, fast or irregular heartbeat, increased sensitivity to cold or heat, excessive sweating, constipation, hair loss, increased thirst or amount of urine,  tremors or shaking, irritability Infusion reactions--chest pain, shortness of breath or trouble breathing, feeling faint or lightheaded Kidney injury (glomerulonephritis)--decrease in the amount of urine, red or dark brown urine, foamy or bubbly urine, swelling of the ankles, hands, or feet Liver injury--right upper belly pain, loss of appetite, nausea, light-colored stool, dark yellow or brown urine, yellowing skin or eyes, unusual weakness or fatigue Pain, tingling, or numbness in the hands or feet, muscle weakness, change in vision, confusion or trouble speaking, loss of balance or coordination, trouble walking, seizures Rash, fever, and swollen lymph nodes Redness, blistering, peeling, or loosening of the skin, including inside the mouth Sudden or severe stomach pain, bloody diarrhea, fever, nausea, vomiting Side effects that usually do not require medical attention (report these to your care team if they continue or are bothersome): Bone, joint, or muscle pain Diarrhea Fatigue Loss of appetite Nausea Skin rash This list may not describe all possible side effects. Call your doctor for medical advice about side effects. You may report side effects to FDA at 1-800-FDA-1088. Where should I keep my medication? This medication is given in a hospital or clinic. It will not be stored at home. NOTE: This sheet is a summary. It may not cover all possible information. If you have questions about this medicine, talk to your doctor, pharmacist, or health care provider.  2024 [employer] (2021-12-05 00:00:00)  

## 2023-09-22 ENCOUNTER — Other Ambulatory Visit: Payer: Self-pay

## 2023-09-23 LAB — T4: T4, Total: 8.2 ug/dL (ref 4.5–12.0)

## 2023-09-28 ENCOUNTER — Encounter: Payer: Self-pay | Admitting: Oncology

## 2023-10-18 ENCOUNTER — Other Ambulatory Visit: Payer: Self-pay

## 2023-10-18 NOTE — Progress Notes (Signed)
 [name]  [address] [place],  [place]  [address] [phone]  Clinic Day: 10/19/2023  Referring physician: [name], MD  ASSESSMENT & PLAN:  Assessment: Malignant melanoma of the left foot He had a punch biopsy and this reveals a Breslow thickness of 1.6 mm and Clark's level 4 with ulceration for a T2b N0 M0 clinically.  We found no distant metastasis on PET scan, but he did have metastasis to the left lower extremity.  His tumor is positive for BRAF and PDL1 mutation. He had this treated with wide local excision.   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   Multiple lesions of the lung These were suspicious for metastatic disease but resolved on the CT scan in December, 2023. PET scan in June was negative, but we cannot absolutely rule out metastatic disease since these lesions were too small to biopsy. We discussed the fact that the resolution of these lesions could represent a response to treatment, or may not have been malignant at all.  We think it is more likely the former, and so I recommend continuation of therapy.  We have had several discussions about this.  His latest scan from January 2025 shows resolution of a multitude of pulmonary nodules.  Spinal stenosis at L4-5 He has had surgical decompression and resection of a synovial cyst in June 2023.   Leukocytosis He has had chronic leukocytosis and we did not find an explanation but the [facility] continue to fluctuate up and down. His WBC is higher than usual probably related to his upper respiratory viral infection.    Lymphedema of the Left Lower Extremity We discussed this diagnosis and the limited approaches available. This is improved.  Plan: He states that diarrhea occurred over the last 2 days and I advised him to take  imodium. He describes an episode in the past but this was about a year ago. CT chest, abdomen, and pelvis done on 09/07/2023 revealed the previously present multitude of the right middle lobe pulmonary micro nodule and nodules have essentially resolved, with a 3 mm residual nodule likely represent sequela of infectious or inflammatory process and no evidence of metastatic disease to the thorax, abdomen, or pelvis is seen. His day 1 cycle 22 of Nivolumab is scheduled on 10/19/2023. He has a elevated WBC of 10.7, stable, hemoglobin of 13.2, and platelet count of 208,000. His CMP, TSH, and T4 today are pending. I will see him back in 4 weeks with CBC, CMP, TSH, and T4. The patient understands the plans discussed today and is in agreement with them.  He knows to contact our office if he develops concerns prior to his next appointment.  I provided 10 minutes of face-to-face time during this encounter and > 50% was spent counseling as documented under my assessment and plan.   [name], MD  [facility] - [facility] DEPT OF [facility] [address] [place] [address] Dept: [phone] Dept Fax: [phone]  No orders of the defined types were placed in this encounter.   CHIEF COMPLAINT:  CC: Malignant melanoma   Current Treatment:  Evaluation and immunotherapy   HISTORY OF PRESENT ILLNESS:  [name] is a 78 y.o. male with a history of malignant melanoma who is  referred in consultation with Dr. Luna Kitchens for assessment and management.  He had a lesion of the dorsum of the left foot in September 2022 which appeared to be a granuloma and was treated in the office.  He later developed a another lesion lateral to this on the dorsum of the left foot which was not hyperpigmented but did bleed easily.  He also has several lesions of his left medial calf which are nodular and span approximately 6 cm.  He has had some increased edema of the left  lower leg.  He was evaluated by dermatology on June 5, who felt this could be a squamous cell carcinoma versus tinea or a drug reaction.  He had biopsies done of both the foot lesion and the Lesion.  Pathology came back that the left dorsal foot shave biopsy revealed a malignant melanoma possibly a primary nodular ulcerated melanoma which had a Breslow thickness of at least 1.6 mm and a Clark's level 4.  Margins are positive as this was a shave biopsy.  Ulceration is present but no satellitosis.  The mitotic index is 4/mm and no lymphovascular invasion was identified.  No brisk tumor infiltrating lymphocytes are seen and tumor regression is absent.  This is felt to be at least a T2b lesion and wide excision was recommended.  The lesion of the left medial calf revealed a dermal map melanoma most consistent with metastatic disease. BRAF and PDL1 are positive.  This information was not available when the patient was admitted for back surgery on June 8 and he had a microlumbar decompression at L4-5 for spinal stenosis.  He was also found to have a synovial cyst at that time which was resected and benign.  He was readmitted several days later for acute pain of the right elbow and both arms.  Aspiration was obtained and this was diagnosed as gouty arthritis.  He did have temporary rehab after his back surgery.  His left leg was evaluated for deep venous thrombosis and was negative.  While he was recuperating from his surgery he had an x-ray which was abnormal which led to a CT scan which revealed at least 3 lung lesions.  MRI of the brain was negative and he is now referred for management of the malignant melanoma. PET scan on 01/31/2023  revealed interval resolution of previously demonstrated hypermetabolic activity within the left foot, no evidence of local recurrence of metastatic disease, resolution of right middle lobe airspace diseases seen on most recent CT. No suspicious pulmonary nodularity, new opacification  of the maxillary sinus with mild peripheral hypermetabolic activity, likely inflammatory, and aortic atherosclerosis.  CT chest, abdomen, and pelvis done on 09/07/2023 that revealed the previously present multitude of the right middle lobe pulmonary micro nodule and nodules have essentially resolved, with a 3 mm residual nodule likely represent sequela of infectious or inflammatory process and no evidence of metastatic disease to the thorax, abdomen, or pelvis.   Oncology History  Malignant melanoma, metastatic (HCC)  01/23/2022 Cancer Staging   Staging form: Melanoma of the Skin, AJCC 8th Edition - Clinical stage from 01/23/2022: Stage III (cT2b, cN1c, cM0) - Signed by [name], MD on 04/07/2022 Histopathologic type: Malignant melanoma, NOS (except juvenile melanoma M-8770/[id number]) Stage prefix: Initial diagnosis Laterality: Left Lymph-vascular invasion (LVI): LVI not present (absent)/not identified Diagnostic confirmation: Positive histology Specimen type: Biopsy / Limited Resection Staged by: Managing physician Mitotic count: 4 Mitotic unit: mm2 Clark's level: Level IV Tumor-infiltrating lymphocytes: Present and non-brisk Breslow  depth (mm): 16 Ulceration of the epidermis: Yes Microsatellites: No Primary tumor regression: Absent Intransit/satellite metastasis: In-transit Lymph node clinically or radiologically detected: No Microscopic confirmation of metastasis: No Sentinel lymph node biopsy performed: No Completion or therapeutic lymph node dissection performed: No Matted nodes: No Stage used in treatment planning: Yes National guidelines used in treatment planning: Yes Type of national guideline used in treatment planning: NCCN   01/31/2022 Initial Diagnosis   Malignant melanoma, metastatic (HCC)   04/14/2022 -  Chemotherapy   Patient is on Treatment Plan : MELANOMA Nivolumab (480) q28d (changed from q14d on 12/4)     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)  02/28/2022  Cancer Staging   Staging form: Melanoma of the Skin, AJCC 8th Edition - Clinical stage from 02/28/2022: Stage IV (cT2b(m), cN1c, cM1a) - Signed by [name], MD on 04/07/2022 Histopathologic type: Malignant melanoma, NOS (except juvenile melanoma M-8770/[id number]) Stage prefix: Initial diagnosis Laterality: Left Multiple tumors: Yes Lymph-vascular invasion (LVI): LVI present/identified, NOS Diagnostic confirmation: Positive histology Specimen type: Excision Staged by: Managing physician Intransit/satellite metastasis: Both in-transit and satellite Prognostic indicators: 02/28/22:  Wide excision of primary of foot - no residual melanoma,  Excision of skin of calf consistent with intransit mets of dermal/subcuticular   Melanoma with LVI, metastatic disease with multifocal lesions and multifocal positive margins 03/24/22: Re-excision with a few atypical melanocytes, no melanoma and clear margins   04/07/2022 Initial Diagnosis   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 Patient states that he feels ok but complains of gas and diarrhea. He states that this recently occurred over the last 2 days and I advised him to take imodium. He describes an episode in the past but this was about a year ago. CT chest, abdomen, and pelvis done on 09/07/2023 revealed the previously present multitude of the right middle lobe pulmonary micro nodule and nodules have essentially resolved, with a 3 mm residual nodule likely represent sequela of infectious or inflammatory process and no evidence of metastatic disease to the thorax, abdomen, or pelvis was seen. His day 1 cycle 22 of Nivolumab is scheduled on 10/19/2023. He has a elevated WBC of 10.7, stable, hemoglobin of 13.2, and platelet count of  208,000. His CMP, TSH, and T4 today are pending. I will see him back in 4 weeks with CBC< CMP, TSH, and T4. He denies signs of infection such as sore throat, sinus drainage, cough, or urinary symptoms.  He denies fevers or recurrent chills. He denies pain. He denies nausea, vomiting, chest pain, dyspnea or cough. His appetite is great and his weight has increased 3 pounds over last 4 weeks .    REVIEW OF SYSTEMS:  Review of Systems  Constitutional: Negative.  Negative for appetite change, chills, diaphoresis, fatigue, fever and unexpected weight change.  HENT:  Negative.  Negative for hearing loss, lump/mass, mouth sores, nosebleeds, sore throat, tinnitus, trouble swallowing and voice change.   Eyes: Negative.  Negative for eye problems and icterus.  Respiratory:  Negative for chest tightness, cough, hemoptysis, shortness of breath and wheezing.   Cardiovascular: Negative.  Negative for chest pain, leg swelling and palpitations.  Gastrointestinal:  Positive for diarrhea. Negative for abdominal distention, abdominal pain, blood in stool, constipation, nausea, rectal pain and vomiting.  Endocrine: Negative.  Negative for hot flashes.  Genitourinary: Negative.  Negative for bladder incontinence, difficulty urinating, dyspareunia, dysuria, frequency, hematuria, nocturia, pelvic pain and penile discharge.   Musculoskeletal: Negative.  Negative for arthralgias, back pain, flank pain, gait problem, myalgias, neck pain and neck stiffness.  Skin: Negative.  Negative for itching, rash and wound.  Neurological: Negative.  Negative for dizziness, extremity weakness, gait problem, headaches, light-headedness, numbness, seizures and speech difficulty.  Hematological: Negative.  Negative for adenopathy. Does not bruise/bleed easily.  Psychiatric/Behavioral: Negative.  Negative for confusion, decreased concentration, depression, sleep disturbance and suicidal ideas. The patient is not nervous/anxious.       VITALS:  Blood pressure (!) 164/68, pulse 69, temperature 97.7 F (36.5 C), temperature source Oral, resp. rate 16, height 5' 9.39" (1.763 m), weight 235 lb (106.6 kg), SpO2 96%.  Wt Readings from Last 3 Encounters:  10/19/23 235 lb (106.6 kg)  09/21/23 232 lb 1.6 oz (105.3 kg)  08/24/23 234 lb (106.1 kg)    Body mass index is 34.31 kg/m.  Performance status (ECOG): 1 - Symptomatic but completely ambulatory  PHYSICAL EXAM:  Physical Exam Vitals and nursing note reviewed.  Constitutional:      General: He is not in acute distress.    Appearance: Normal appearance. He is normal weight. He is not ill-appearing, toxic-appearing or diaphoretic.  HENT:     Head: Normocephalic and atraumatic.     Right Ear: Tympanic membrane, ear canal and external ear normal. There is no impacted cerumen.     Left Ear: Tympanic membrane, ear canal and external ear normal. There is no impacted cerumen.     Nose: Nose normal. No congestion or rhinorrhea.     Mouth/Throat:     Mouth: Mucous membranes are moist.     Pharynx: Oropharynx is clear. No oropharyngeal exudate or posterior oropharyngeal erythema.  Eyes:     General: No scleral icterus.       Right eye: No discharge.        Left eye: No discharge.     Extraocular Movements: Extraocular movements intact.     Conjunctiva/sclera: Conjunctivae normal.     Pupils: Pupils are equal, round, and reactive to light.  Cardiovascular:     Rate and Rhythm: Normal rate and regular rhythm.     Pulses: Normal pulses.     Heart sounds: Normal heart sounds. No murmur heard.    No friction rub. No gallop.  Pulmonary:     Effort: Pulmonary effort is normal. No respiratory distress.     Breath sounds: No stridor. No wheezing, rhonchi or rales.  Chest:     Chest wall: No tenderness.  Abdominal:     General: Bowel sounds are normal. There is no distension.     Palpations: Abdomen is soft. There is no hepatomegaly, splenomegaly or mass.     Tenderness:  There is no abdominal tenderness. There is no right CVA tenderness, left CVA tenderness, guarding or rebound.     Hernia: No hernia is present.  Musculoskeletal:        General: Normal range of motion.     Cervical back: Normal range of motion and neck supple. No tenderness.     Right lower leg: No edema.     Left lower leg: 1+ Edema (trace) present.  Lymphadenopathy:     Cervical: No cervical adenopathy.     Upper Body:     Right upper body: No supraclavicular or axillary adenopathy.  Left upper body: No supraclavicular or axillary adenopathy.     Lower Body: No right inguinal adenopathy. No left inguinal adenopathy.  Skin:    General: Skin is warm and dry.     Coloration: Skin is not jaundiced.     Findings: No rash. Scaling rash: Scattered benign appearing nevi.     Comments: Large healing scar of the left medial knee without evidence of recurrence. Scattered benign appearing nevi. Vitiligo of the skin  Neurological:     General: No focal deficit present.     Mental Status: He is alert and oriented to person, place, and time. Mental status is at baseline.     Cranial Nerves: No cranial nerve deficit.     Sensory: No sensory deficit.     Motor: No weakness.     Coordination: Coordination normal.     Gait: Gait normal.     Deep Tendon Reflexes: Reflexes normal.  Psychiatric:        Mood and Affect: Mood normal.        Behavior: Behavior normal.        Thought Content: Thought content normal.        Judgment: Judgment normal.    LABS:      Latest Ref Rng & Units 10/19/2023    1:34 PM 09/21/2023   11:00 AM 08/24/2023    1:38 PM  CBC  WBC 4.0 - 10.5 K/uL 10.7  10.9  14.7   Hemoglobin 13.0 - 17.0 g/dL 46.9  62.9  52.8   Hematocrit 39.0 - 52.0 % 38.2  41.3  38.9   Platelets 150 - 400 K/uL 208  229  226       Latest Ref Rng & Units 10/19/2023    1:34 PM 09/21/2023   11:00 AM 08/24/2023    1:38 PM  CMP  Glucose 70 - 99 mg/dL 82  99  97   BUN 8 - 23 mg/dL 21  25  23     Creatinine 0.61 - 1.24 mg/dL 4.13  2.44  0.10   Sodium 135 - 145 mmol/L 138  138  143   Potassium 3.5 - 5.1 mmol/L 3.8  4.3  4.3   Chloride 98 - 111 mmol/L 102  101  104   CO2 22 - 32 mmol/L 25  26  28    Calcium 8.9 - 10.3 mg/dL 9.1  9.5  9.6   Total Protein 6.5 - 8.1 g/dL 6.7  7.0  7.0   Total Bilirubin 0.0 - 1.2 mg/dL 0.8  1.2  0.8   Alkaline Phos 38 - 126 U/L 79  91  96   AST 15 - 41 U/L 20  17  22    ALT 0 - 44 U/L 17  13  22     Lab Results  Component Value Date   LDH 140 02/01/2023   LDH 160 02/07/2022   Lab Results  Component Value Date   TSH 1.560 10/19/2023   T4TOTAL 8.3 10/19/2023    STUDIES:    HISTORY:   Past Medical History:  Diagnosis Date   Arthritis    gouty arthritis June 2023   History of colon polyps    History of kidney stones    Hypercholesteremia    Hypertension    Leukocytosis 06/16/2022   Malignant melanoma (HCC)    of the left foot with in-transit metastases   Peptic ulcer    Spinal stenosis at L4-L5 level    Stroke [facility])    CVA,  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 , 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     Past Surgical History:  Procedure Laterality Date   LUMBAR LAMINECTOMY/DECOMPRESSION MICRODISCECTOMY N/A 01/26/2022   Procedure: Microlumbar decompression Lumbar four-five, lateral mass fusion with autograft and allograft bone;  Surgeon: Jene Every, MD;  Location: [facility] OR;  Service: Orthopedics;  Laterality: N/A;   PARTIAL GASTRECTOMY     TOTAL HIP ARTHROPLASTY Right 06/18/2018   Procedure: RIGHT TOTAL HIP ARTHROPLASTY ANTERIOR APPROACH;  Surgeon: Durene Romans, MD;  Location: [facility] ORS;  Service: Orthopedics;  Laterality: Right;    Family History  Problem Relation Age of Onset   Lung cancer Sister    Melanoma Brother     Social History:  reports that he has quit smoking. He has never used smokeless tobacco. He reports that he does not currently use alcohol. He reports that he does not use drugs.The patient is alone  today.  Allergies:  Allergies  Allergen Reactions   Morphine Itching    Patient states it is tolerable itching    Current Medications: Current Outpatient Medications  Medication Sig Dispense Refill   amLODipine (NORVASC) 5 MG tablet Take 5 mg by mouth daily.     Calcium Carbonate (CALCIUM 600 PO) Take 1 tablet by mouth 2 (two) times daily.     clopidogrel (PLAVIX) 75 MG tablet Take 1 tablet by mouth daily.     docusate sodium (COLACE) 100 MG capsule 1 capsule 2 TIMES DAILY (route: oral)     famotidine (PEPCID) 40 MG tablet TAKE 1 TABLET EVERY DAY 90 tablet 3   folic acid (FOLVITE) 400 MCG tablet Take 400 mcg by mouth daily.     gabapentin (NEURONTIN) 300 MG capsule Take 1 capsule (300 mg total) by mouth 3 (three) times daily. 270 capsule 3   hydrochlorothiazide (HYDRODIURIL) 12.5 MG tablet Take 12.5 mg by mouth daily.     lisinopril (ZESTRIL) 20 MG tablet      Multiple Vitamin (MULTIVITAMIN WITH MINERALS) TABS tablet Take 1 tablet by mouth in the morning.     mupirocin ointment (BACTROBAN) 2 % Apply topically.     neomycin-polymyxin-hydrocortisone (CORTISPORIN) OTIC solution Place 3 drops into the right ear 4 (four) times daily. 10 mL 1   Omega-3 1000 MG CAPS Take by mouth.     ondansetron (ZOFRAN) 4 MG tablet Take 1 tablet (4 mg total) by mouth every 4 (four) hours as needed for nausea. 90 tablet 3   oxyCODONE (OXY IR/ROXICODONE) 5 MG immediate release tablet Take 1 tablet (5 mg total) by mouth every 6 (six) hours as needed for severe pain. (Patient not taking: Reported on 07/27/2023) 30 tablet 0   polyethylene glycol (MIRALAX / GLYCOLAX) 17 g packet Take 17 g by mouth daily. 14 each 0   pravastatin (PRAVACHOL) 40 MG tablet Take 40 mg by mouth daily.     prochlorperazine (COMPAZINE) 10 MG tablet Take 1 tablet (10 mg total) by mouth every 6 (six) hours as needed for nausea or vomiting. 90 tablet 3   traZODone (DESYREL) 50 MG tablet TAKE 1 TABLET AT BEDTIME AS NEEDED FOR SLEEP 30 tablet  11   triamcinolone cream (KENALOG) 0.1 % Apply topically 2 (two) times daily.     No current facility-administered medications for this visit.    I,Jasmine M Lassiter,acting as a scribe for [name], MD.,have documented all relevant documentation on the behalf of [name], MD,as directed by  [name], MD while in  the presence of [name], MD.

## 2023-10-19 ENCOUNTER — Inpatient Hospital Stay: Payer: Medicare HMO

## 2023-10-19 ENCOUNTER — Telehealth: Payer: Self-pay | Admitting: Oncology

## 2023-10-19 ENCOUNTER — Inpatient Hospital Stay: Payer: Medicare HMO | Attending: Oncology | Admitting: Oncology

## 2023-10-19 ENCOUNTER — Other Ambulatory Visit: Payer: Self-pay | Admitting: Oncology

## 2023-10-19 ENCOUNTER — Encounter: Payer: Self-pay | Admitting: Oncology

## 2023-10-19 VITALS — BP 164/68 | HR 69 | Temp 97.7°F | Resp 16 | Ht 69.39 in | Wt 235.0 lb

## 2023-10-19 DIAGNOSIS — Z79620 Long term (current) use of immunosuppressive biologic: Secondary | ICD-10-CM | POA: Diagnosis not present

## 2023-10-19 DIAGNOSIS — C439 Malignant melanoma of skin, unspecified: Secondary | ICD-10-CM | POA: Diagnosis not present

## 2023-10-19 DIAGNOSIS — C4372 Malignant melanoma of left lower limb, including hip: Secondary | ICD-10-CM | POA: Insufficient documentation

## 2023-10-19 DIAGNOSIS — C792 Secondary malignant neoplasm of skin: Secondary | ICD-10-CM

## 2023-10-19 DIAGNOSIS — Z5112 Encounter for antineoplastic immunotherapy: Secondary | ICD-10-CM | POA: Insufficient documentation

## 2023-10-19 LAB — CBC WITH DIFFERENTIAL (CANCER CENTER ONLY)
Abs Immature Granulocytes: 0.03 10*3/uL (ref 0.00–0.07)
Basophils Absolute: 0 10*3/uL (ref 0.0–0.1)
Basophils Relative: 0 %
Eosinophils Absolute: 0.4 10*3/uL (ref 0.0–0.5)
Eosinophils Relative: 4 %
HCT: 38.2 % — ABNORMAL LOW (ref 39.0–52.0)
Hemoglobin: 13.2 g/dL (ref 13.0–17.0)
Immature Granulocytes: 0 %
Lymphocytes Relative: 25 %
Lymphs Abs: 2.7 10*3/uL (ref 0.7–4.0)
MCH: 29.5 pg (ref 26.0–34.0)
MCHC: 34.6 g/dL (ref 30.0–36.0)
MCV: 85.5 fL (ref 80.0–100.0)
Monocytes Absolute: 0.9 10*3/uL (ref 0.1–1.0)
Monocytes Relative: 9 %
Neutro Abs: 6.6 10*3/uL (ref 1.7–7.7)
Neutrophils Relative %: 62 %
Platelet Count: 208 10*3/uL (ref 150–400)
RBC: 4.47 MIL/uL (ref 4.22–5.81)
RDW: 13 % (ref 11.5–15.5)
WBC Count: 10.7 10*3/uL — ABNORMAL HIGH (ref 4.0–10.5)
nRBC: 0 % (ref 0.0–0.2)
nRBC: 0 /100{WBCs}

## 2023-10-19 LAB — CMP (CANCER CENTER ONLY)
ALT: 17 U/L (ref 0–44)
AST: 20 U/L (ref 15–41)
Albumin: 4.2 g/dL (ref 3.5–5.0)
Alkaline Phosphatase: 79 U/L (ref 38–126)
Anion gap: 11 (ref 5–15)
BUN: 21 mg/dL (ref 8–23)
CO2: 25 mmol/L (ref 22–32)
Calcium: 9.1 mg/dL (ref 8.9–10.3)
Chloride: 102 mmol/L (ref 98–111)
Creatinine: 1.14 mg/dL (ref 0.61–1.24)
GFR, Estimated: >60 mL/min (ref >60)
Glucose, Bld: 82 mg/dL (ref 70–99)
Potassium: 3.8 mmol/L (ref 3.5–5.1)
Sodium: 138 mmol/L (ref 135–145)
Total Bilirubin: 0.8 mg/dL (ref 0.0–1.2)
Total Protein: 6.7 g/dL (ref 6.5–8.1)

## 2023-10-19 LAB — TSH: TSH: 1.56 u[IU]/mL (ref 0.350–4.500)

## 2023-10-19 MED ORDER — HEPARIN SOD (PORK) LOCK FLUSH 100 UNIT/ML IV SOLN: 500.0000 [IU] | Freq: Once | INTRAVENOUS | Status: DC | PRN

## 2023-10-19 MED ORDER — SODIUM CHLORIDE 0.9 % IV SOLN: Freq: Once | INTRAVENOUS | Status: AC

## 2023-10-19 MED ORDER — SODIUM CHLORIDE 0.9 % IV SOLN
480.0000 mg | Freq: Once | INTRAVENOUS | Status: AC
  Administered 2023-10-19: 480 mg via INTRAVENOUS
  Filled 2023-10-19: qty 48

## 2023-10-19 MED ORDER — SODIUM CHLORIDE 0.9% FLUSH: 10.0000 mL | INTRAVENOUS | Status: DC | PRN

## 2023-10-19 NOTE — Telephone Encounter (Signed)
 10/19/23 Spoke with patient and confirmed next appt.Appt moved out Per patient request.

## 2023-10-19 NOTE — Patient Instructions (Signed)
 Nivolumab Injection What is this medication? NIVOLUMAB (nye VOL ue mab) treats some types of cancer. It works by helping your immune system slow or stop the spread of cancer cells. It is a monoclonal antibody. This medicine may be used for other purposes; ask your health care provider or pharmacist if you have questions. COMMON BRAND NAME(S): Opdivo What should I tell my care team before I take this medication? They need to know if you have any of these conditions: Allogeneic stem cell transplant (uses someone else's stem cells) Autoimmune diseases, such as Crohn disease, ulcerative colitis, lupus History of chest radiation Nervous system problems, such as Guillain-Barre syndrome or myasthenia gravis Organ transplant An unusual or allergic reaction to nivolumab, other medications, foods, dyes, or preservatives Pregnant or trying to get pregnant Breast-feeding How should I use this medication? This medication is infused into a vein. It is given in a hospital or clinic setting. A special MedGuide will be given to you before each treatment. Be sure to read this information carefully each time. Talk to your care team about the use of this medication in children. While it may be prescribed for children as young as 12 years for selected conditions, precautions do apply. Overdosage: If you think you have taken too much of this medicine contact a poison control center or emergency room at once. NOTE: This medicine is only for you. Do not share this medicine with others. What if I miss a dose? Keep appointments for follow-up doses. It is important not to miss your dose. Call your care team if you are unable to keep an appointment. What may interact with this medication? Interactions have not been studied. This list may not describe all possible interactions. Give your health care provider a list of all the medicines, herbs, non-prescription drugs, or dietary supplements you use. Also tell them if you  smoke, drink alcohol, or use illegal drugs. Some items may interact with your medicine. What should I watch for while using this medication? Your condition will be monitored carefully while you are receiving this medication. You may need blood work while taking this medication. This medication may cause serious skin reactions. They can happen weeks to months after starting the medication. Contact your care team right away if you notice fevers or flu-like symptoms with a rash. The rash may be red or purple and then turn into blisters or peeling of the skin. You may also notice a red rash with swelling of the face, lips, or lymph nodes in your neck or under your arms. Tell your care team right away if you have any change in your eyesight. Talk to your care team if you are pregnant or think you might be pregnant. A negative pregnancy test is required before starting this medication. A reliable form of contraception is recommended while taking this medication and for 5 months after the last dose. Talk to your care team about effective forms of contraception. Do not breast-feed while taking this medication and for 5 months after the last dose. What side effects may I notice from receiving this medication? Side effects that you should report to your care team as soon as possible: Allergic reactions--skin rash, itching, hives, swelling of the face, lips, tongue, or throat Dry cough, shortness of breath or trouble breathing Eye pain, redness, irritation, or discharge with blurry or decreased vision Heart muscle inflammation--unusual weakness or fatigue, shortness of breath, chest pain, fast or irregular heartbeat, dizziness, swelling of the ankles, feet, or hands Hormone  gland problems--headache, sensitivity to light, unusual weakness or fatigue, dizziness, fast or irregular heartbeat, increased sensitivity to cold or heat, excessive sweating, constipation, hair loss, increased thirst or amount of urine,  tremors or shaking, irritability Infusion reactions--chest pain, shortness of breath or trouble breathing, feeling faint or lightheaded Kidney injury (glomerulonephritis)--decrease in the amount of urine, red or dark brown urine, foamy or bubbly urine, swelling of the ankles, hands, or feet Liver injury--right upper belly pain, loss of appetite, nausea, light-colored stool, dark yellow or brown urine, yellowing skin or eyes, unusual weakness or fatigue Pain, tingling, or numbness in the hands or feet, muscle weakness, change in vision, confusion or trouble speaking, loss of balance or coordination, trouble walking, seizures Rash, fever, and swollen lymph nodes Redness, blistering, peeling, or loosening of the skin, including inside the mouth Sudden or severe stomach pain, bloody diarrhea, fever, nausea, vomiting Side effects that usually do not require medical attention (report these to your care team if they continue or are bothersome): Bone, joint, or muscle pain Diarrhea Fatigue Loss of appetite Nausea Skin rash This list may not describe all possible side effects. Call your doctor for medical advice about side effects. You may report side effects to FDA at 1-800-FDA-1088. Where should I keep my medication? This medication is given in a hospital or clinic. It will not be stored at home. NOTE: This sheet is a summary. It may not cover all possible information. If you have questions about this medicine, talk to your doctor, pharmacist, or health care provider.  2024 [employer] (2021-12-05 00:00:00)

## 2023-10-20 ENCOUNTER — Other Ambulatory Visit: Payer: Self-pay

## 2023-10-20 LAB — T4: T4, Total: 8.3 ug/dL (ref 4.5–12.0)

## 2023-10-23 ENCOUNTER — Encounter: Payer: Self-pay | Admitting: Oncology

## 2023-10-25 ENCOUNTER — Other Ambulatory Visit: Payer: Self-pay

## 2023-10-25 DIAGNOSIS — I10 Essential (primary) hypertension: Secondary | ICD-10-CM | POA: Diagnosis not present

## 2023-10-25 DIAGNOSIS — Z6832 Body mass index (BMI) 32.0-32.9, adult: Secondary | ICD-10-CM | POA: Diagnosis not present

## 2023-10-29 IMAGING — MR MR HEAD WO/W CM
17 series · 48 of 48 positions shown · IV contrast (gadavist)
Comparison: None Available.

CLINICAL DATA: Skin cancer, staging

EXAM:
MRI HEAD WITHOUT AND WITH CONTRAST
TECHNIQUE: Multiplanar, multiecho pulse sequences of the brain and surrounding
structures were obtained without and with intravenous contrast.
CONTRAST:  10mL GADAVIST GADOBUTROL 1 MMOL/ML IV SOLN

[Series 5: DWI · axial · 3.0mm · 1.36mm/px · z∈[-67,+79]mm · 5 of 104 slices shown (1 of 2)]
[im 1/104]
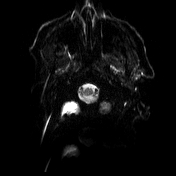
[im 26/104]
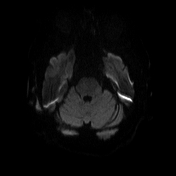
[im 52/104]
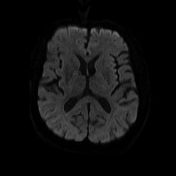
[im 78/104]
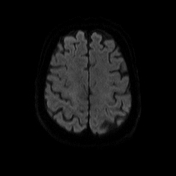
[im 104/104]
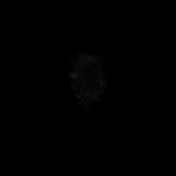

[Series 6: DWI · axial · 3.0mm · 1.36mm/px · z∈[-67,+79]mm · 2 of 52 slices shown (2 of 2)]
[im 1/52]
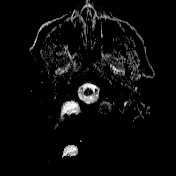
[im 52/52]
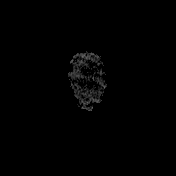

[Series 7: T1 · sagittal · 5.0mm · 0.75mm/px · 1 of 26 slices shown (1 of 4)]
[im 1/26]
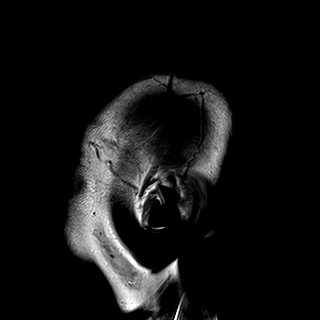

[Series 8: dwi_tracew · axial · 3.0mm · 1.08mm/px · z∈[-64,+79]mm · 5 of 102 slices shown]
[im 1/102]
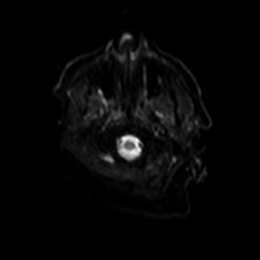
[im 26/102]
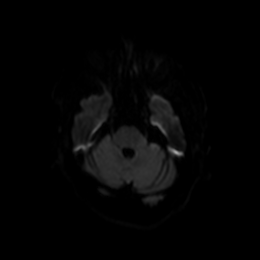
[im 51/102]
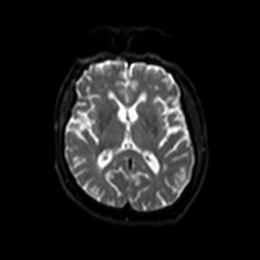
[im 76/102]
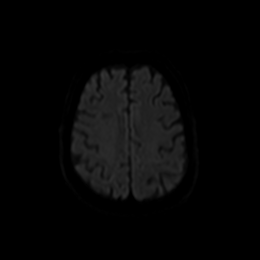
[im 102/102]
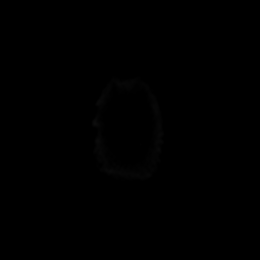

[Series 9: dwi_adc · axial · 3.0mm · 1.08mm/px · z∈[-64,+79]mm · 2 of 51 slices shown]
[im 1/51]
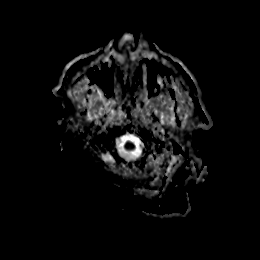
[im 51/51]
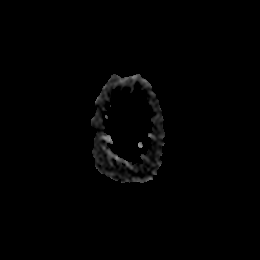

[Series 10: T2 · axial · 5.0mm · 0.62mm/px · 1 of 26 slices shown]
[im 1/26]
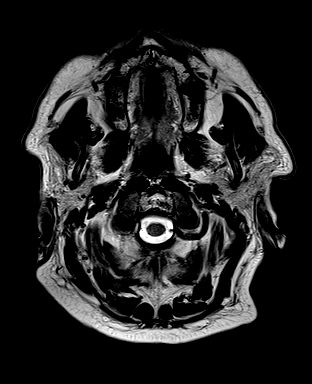

[Series 11: swi_images · axial · 3.0mm · 0.75mm/px · z∈[-81,+99]mm · 3 of 64 slices shown]
[im 1/64]
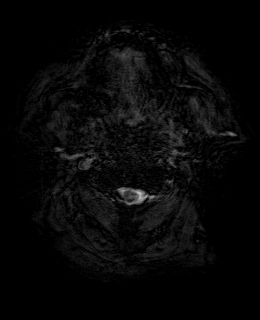
[im 32/64]
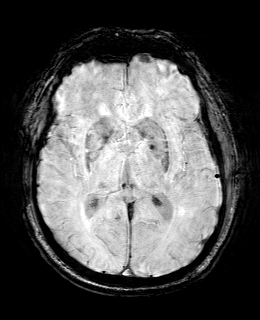
[im 64/64]
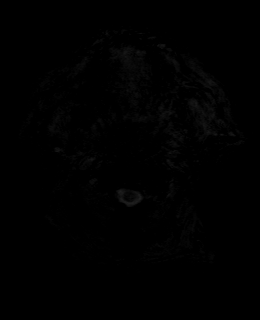

[Series 13: FLAIR · axial · 3.0mm · 0.75mm/px · z∈[-67,+84]mm · 2 of 54 slices shown]
[im 1/54]
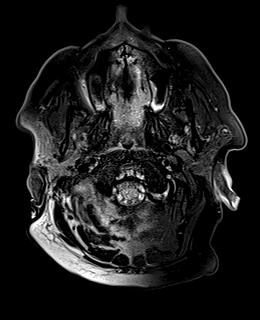
[im 54/54]
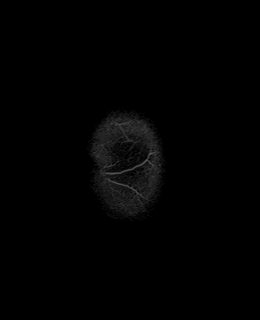

[Series 14: T1 · axial · 1.0mm · 0.94mm/px · z∈[-75,+92]mm · 8 of 176 slices shown (2 of 4)]
[im 1/176]
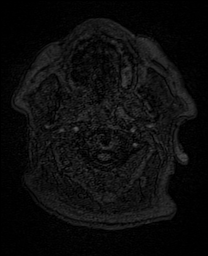
[im 26/176]
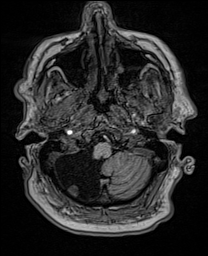
[im 51/176]
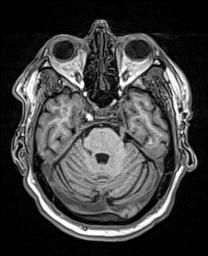
[im 76/176]
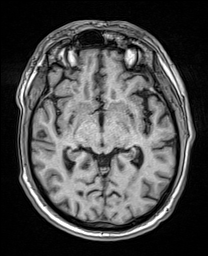
[im 101/176]
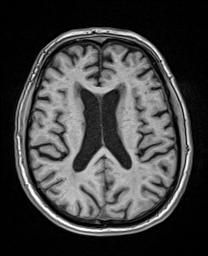
[im 126/176]
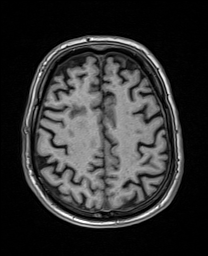
[im 151/176]
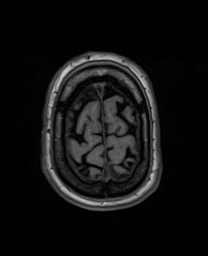
[im 176/176]
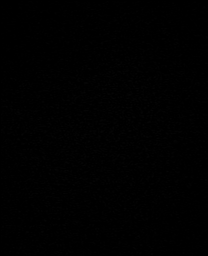

[Series 15: cor dwi_tracew · coronal · 5.0mm · 1.53mm/px · 3 of 56 slices shown]
[im 1/56]
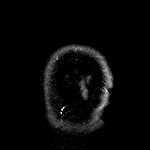
[im 28/56]
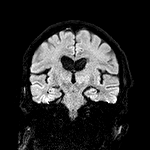
[im 56/56]
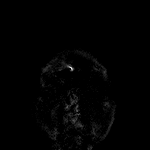

[Series 16: cor dwi_adc · coronal · 5.0mm · 1.53mm/px · 1 of 28 slices shown]
[im 1/28]
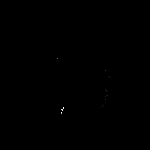

[Series 17: T2 post-contrast · coronal · 5.0mm · 0.69mm/px · 1 of 28 slices shown]
[im 1/28]
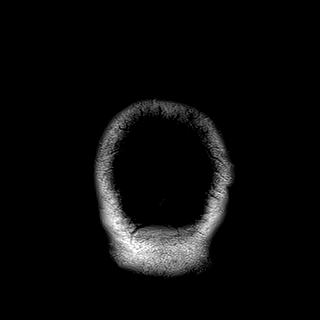

[Series 18: T1 post-contrast · axial · 1.0mm · 0.94mm/px · z∈[-75,+92]mm · 8 of 176 slices shown (1 of 3)]
[im 1/176]
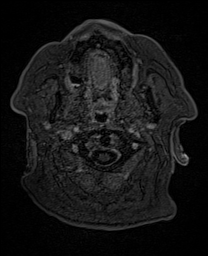
[im 26/176]
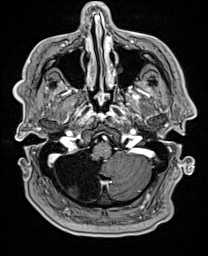
[im 51/176]
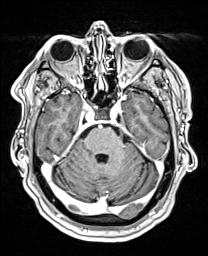
[im 76/176]
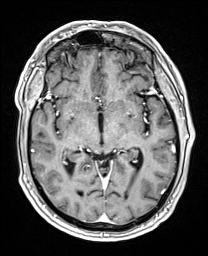
[im 101/176]
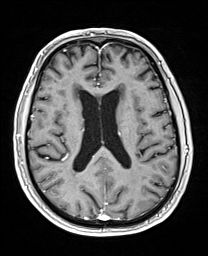
[im 126/176]
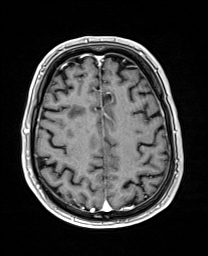
[im 151/176]
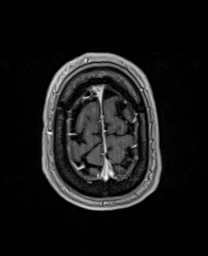
[im 176/176]
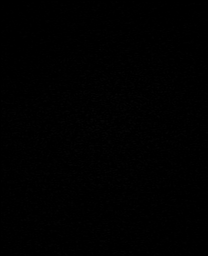

[Series 19: T1 · sagittal · 4.0mm · 0.94mm/px · 2 of 36 slices shown (3 of 4)]
[im 1/36]
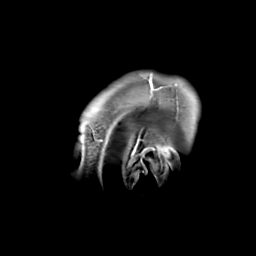
[im 36/36]
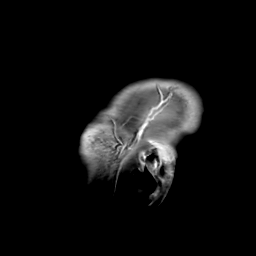

[Series 20: T1 · coronal · 4.0mm · 0.94mm/px · 2 of 42 slices shown (4 of 4)]
[im 1/42]
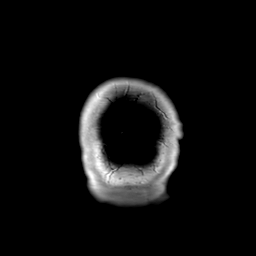
[im 42/42]
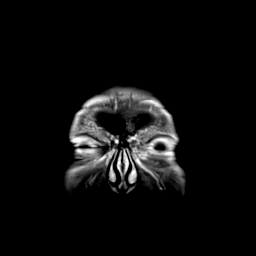

[Series 21: T1 post-contrast · coronal · 5.0mm · 0.43mm/px · 1 of 28 slices shown (2 of 3)]
[im 1/28]
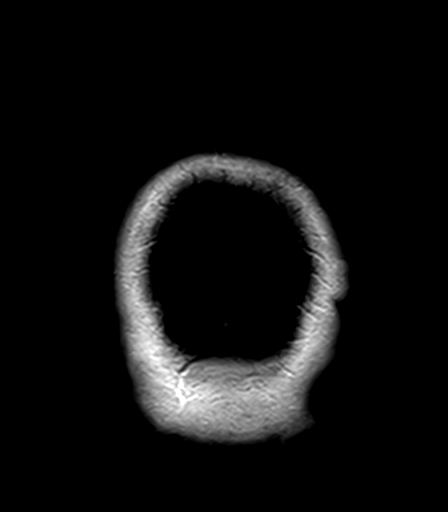

[Series 22: T1 post-contrast · sagittal · 5.0mm · 0.75mm/px · 1 of 26 slices shown (3 of 3)]
[im 1/26]
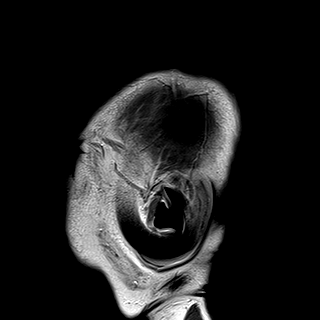

[48 of 48 positions shown; findings below may reference images not displayed]

FINDINGS: Brain: There is no acute infarction or hemorrhage. Large chronic
inferior right cerebellar infarct. Encephalomalacia/gliosis in the
right frontal lobe extending from the calvarium to the right lateral
ventricle likely reflects a prior catheter tract. There are some
chronic blood products along this tract as well. Additional patchy
and confluent areas of T2 hyperintensity in the supratentorial white
matter are nonspecific may reflect moderate chronic microvascular
ischemic changes.

No intracranial mass or mass effect.  No abnormal enhancement.

Vascular: Major vessel flow voids at the skull base are preserved.

Skull and upper cervical spine: Normal marrow signal is preserved.

Sinuses/Orbits: Paranasal sinuses are aerated. Orbits are
unremarkable.

Other: Sella is unremarkable.  Mastoid air cells are clear.
IMPRESSION: No evidence of intracranial metastatic disease.

Chronic findings detailed above.

## 2023-10-30 ENCOUNTER — Encounter: Payer: Self-pay | Admitting: Oncology

## 2023-11-09 ENCOUNTER — Other Ambulatory Visit: Payer: Self-pay

## 2023-11-09 NOTE — Progress Notes (Signed)
 [facility]  [address] [place],  [place]  [address] [phone]  Clinic Day: 11/20/23  Referring physician: [name], MD  ASSESSMENT & PLAN:  Assessment: Malignant melanoma of the left foot He had a punch biopsy and this reveals a Breslow thickness of 1.6 mm and Clark's level 4 with ulceration for a T2b N0 M0 clinically.  We found no distant metastasis on PET scan, but he did have metastasis to the left lower extremity.  His tumor is positive for BRAF and PDL1 mutation. He had this treated with wide local excision.   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   Multiple lesions of the lung These were suspicious for metastatic disease but resolved on the CT scan in December, 2023. PET scan in June was negative, but we cannot absolutely rule out metastatic disease since these lesions were too small to biopsy. We discussed the fact that the resolution of these lesions could represent a response to treatment, or may not have been malignant at all.  We think it is more likely the former, and so I recommend continuation of therapy.  We have had several discussions about this.  His latest scan from January 2025 shows resolution of a multitude of pulmonary nodules.  Spinal stenosis at L4-5 He has had surgical decompression and resection of a synovial cyst in June 2023.   Leukocytosis He has had chronic leukocytosis and we did not find an explanation but the [facility] continue to fluctuate up and down. His WBC is higher than usual probably related to his upper respiratory viral infection.    Lymphedema of the Left Lower Extremity We discussed this diagnosis and the limited approaches available. This is improved.  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   Tinea Cruris This is mild and partially treated with cream from the dermatologist. I will order mycostatin powder and discussed prevention.  Plan: He informed me that his PCP increased his lisinopril dose from 20 mg to 30 mg once daily. He informed me of a irritated area around his left groin area, during physical exam this looks to be tinea cruris and I will prescribe mycostatin powder. Patient has an appointment with dermatology on the 10th. His day 1 cycle 23 of nivolumab is scheduled on 11/20/2023. He has a chronically elevated WBC of 11.8 up from 10.7, hemoglobin of 14.1, and platelet count of 224,000. His CMP is completely normal and his TSH and T4 levels are pending today. I will see him back in 4 weeks with CBC, CMP, TSH, and T4. The patient understands the plans discussed today and is in agreement with them.  He knows to contact our office if he develops concerns prior to his next appointment.  I provided 13 minutes of face-to-face time during this encounter and > 50% was spent counseling as documented under my assessment and plan.   [name], MD  [facility] - A DEPT OF [facility] [address] [place] [address] Dept: [phone] Dept Fax: [phone]  No orders of the defined types were placed in this encounter.   CHIEF COMPLAINT:  CC: Malignant melanoma   Current Treatment:  Evaluation and immunotherapy   HISTORY OF PRESENT  ILLNESS:  [name] is a 78 y.o. male with a history of malignant melanoma who is referred in consultation with Dr. Luna Kitchens for assessment and management.  He had a lesion of the dorsum of the left foot in September 2022 which appeared to be a granuloma and was treated in the office.  He later developed a another lesion lateral to this on the dorsum of the left foot which was not hyperpigmented but did bleed easily.  He also has several lesions of his left medial  calf which are nodular and span approximately 6 cm.  He has had some increased edema of the left lower leg.  He was evaluated by dermatology on June 5, who felt this could be a squamous cell carcinoma versus tinea or a drug reaction.  He had biopsies done of both the foot lesion and the Lesion.  Pathology came back that the left dorsal foot shave biopsy revealed a malignant melanoma possibly a primary nodular ulcerated melanoma which had a Breslow thickness of at least 1.6 mm and a Clark's level 4.  Margins are positive as this was a shave biopsy.  Ulceration is present but no satellitosis.  The mitotic index is 4/mm and no lymphovascular invasion was identified.  No brisk tumor infiltrating lymphocytes are seen and tumor regression is absent.  This is felt to be at least a T2b lesion and wide excision was recommended.  The lesion of the left medial calf revealed a dermal map melanoma most consistent with metastatic disease. BRAF and PDL1 are positive.  This information was not available when the patient was admitted for back surgery on June 8 and he had a microlumbar decompression at L4-5 for spinal stenosis.  He was also found to have a synovial cyst at that time which was resected and benign.  He was readmitted several days later for acute pain of the right elbow and both arms.  Aspiration was obtained and this was diagnosed as gouty arthritis.  He did have temporary rehab after his back surgery.  His left leg was evaluated for deep venous thrombosis and was negative.  While he was recuperating from his surgery he had an x-ray which was abnormal which led to a CT scan which revealed at least 3 lung lesions.  MRI of the brain was negative and he is now referred for management of the malignant melanoma. PET scan on 01/31/2023  revealed interval resolution of previously demonstrated hypermetabolic activity within the left foot, no evidence of local recurrence of metastatic disease, resolution of right middle lobe  airspace diseases seen on most recent CT. No suspicious pulmonary nodularity, new opacification of the maxillary sinus with mild peripheral hypermetabolic activity, likely inflammatory, and aortic atherosclerosis.  CT chest, abdomen, and pelvis done on 09/07/2023 that revealed the previously present multitude of the right middle lobe pulmonary micro nodule and nodules have essentially resolved, with a 3 mm residual nodule likely represent sequela of infectious or inflammatory process and no evidence of metastatic disease to the thorax, abdomen, or pelvis.   Oncology History  Malignant melanoma, metastatic (HCC)  01/23/2022 Cancer Staging   Staging form: Melanoma of the Skin, AJCC 8th Edition - Clinical stage from 01/23/2022: Stage III (cT2b, cN1c, cM0) - Signed by [name], MD on 04/07/2022 Histopathologic type: Malignant melanoma, NOS (except juvenile melanoma M-8770/[id number]) Stage prefix: Initial diagnosis Laterality: Left Lymph-vascular invasion (LVI): LVI not present (absent)/not identified Diagnostic confirmation: Positive histology Specimen type: Biopsy / Limited Resection Staged by: Managing  physician Mitotic count: 4 Mitotic unit: mm2 Clark's level: Level IV Tumor-infiltrating lymphocytes: Present and non-brisk Breslow depth (mm): 16 Ulceration of the epidermis: Yes Microsatellites: No Primary tumor regression: Absent Intransit/satellite metastasis: In-transit Lymph node clinically or radiologically detected: No Microscopic confirmation of metastasis: No Sentinel lymph node biopsy performed: No Completion or therapeutic lymph node dissection performed: No Matted nodes: No Stage used in treatment planning: Yes National guidelines used in treatment planning: Yes Type of national guideline used in treatment planning: NCCN   01/31/2022 Initial Diagnosis   Malignant melanoma, metastatic (HCC)   04/14/2022 -  Chemotherapy   Patient is on Treatment Plan : MELANOMA Nivolumab (480)  q28d (changed from q14d on 12/4)     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)  02/28/2022 Cancer Staging   Staging form: Melanoma of the Skin, AJCC 8th Edition - Clinical stage from 02/28/2022: Stage IV (cT2b(m), cN1c, cM1a) - Signed by [name], MD on 04/07/2022 Histopathologic type: Malignant melanoma, NOS (except juvenile melanoma M-8770/[id number]) Stage prefix: Initial diagnosis Laterality: Left Multiple tumors: Yes Lymph-vascular invasion (LVI): LVI present/identified, NOS Diagnostic confirmation: Positive histology Specimen type: Excision Staged by: Managing physician Intransit/satellite metastasis: Both in-transit and satellite Prognostic indicators: 02/28/22:  Wide excision of primary of foot - no residual melanoma,  Excision of skin of calf consistent with intransit mets of dermal/subcuticular   Melanoma with LVI, metastatic disease with multifocal lesions and multifocal positive margins 03/24/22: Re-excision with a few atypical melanocytes, no melanoma and clear margins   04/07/2022 Initial Diagnosis   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 He is therefore on maintenance immunotherapy with nivolumab. Patient states that he feels well and has no complaints of pain. He informed me that his PCP increased his lisinopril dose from 20 mg to 30 mg once daily. He informed me of a irritated area around his left groin area, during physical exam this looks to be tinea cruris and I will prescribe mycostatin powder. Patient has an appointment with dermatology on the 10th. His day 1 cycle 23 of nivolumab is scheduled on 11/20/2023. He has a chronically elevated WBC of 11.8 up from 10.7, hemoglobin of 14.1, and  platelet count of 224,000. His CMP is completely normal and his TSH and T4 levels are pending today. I will see him back in 4 weeks with CBC, CMP, TSH, and T4. He denies signs of infection such as sore throat, sinus drainage, cough, or urinary symptoms.  He denies fevers or recurrent chills. He denies pain. He denies nausea, vomiting, chest pain, dyspnea or cough. His appetite is good and his weight has decreased 2 pounds over last month .   REVIEW OF SYSTEMS:  Review of Systems  Constitutional: Negative.  Negative for appetite change, chills, diaphoresis, fatigue, fever and unexpected weight change.  HENT:  Negative.  Negative for hearing loss, lump/mass, mouth sores, nosebleeds, sore throat, tinnitus, trouble swallowing and voice change.   Eyes: Negative.  Negative for eye problems and icterus.  Respiratory:  Negative for chest tightness, cough, hemoptysis, shortness of breath and wheezing.   Cardiovascular: Negative.  Negative for chest pain, leg swelling and palpitations.  Gastrointestinal:  Positive for diarrhea. Negative for abdominal distention,  abdominal pain, blood in stool, constipation, nausea, rectal pain and vomiting.  Endocrine: Negative.  Negative for hot flashes.  Genitourinary: Negative.  Negative for bladder incontinence, difficulty urinating, dyspareunia, dysuria, frequency, hematuria, nocturia, pelvic pain and penile discharge.   Musculoskeletal: Negative.  Negative for arthralgias, back pain, flank pain, gait problem, myalgias, neck pain and neck stiffness.  Skin:  Positive for rash (left groin). Negative for itching and wound.  Neurological: Negative.  Negative for dizziness, extremity weakness, gait problem, headaches, light-headedness, numbness, seizures and speech difficulty.  Hematological: Negative.  Negative for adenopathy. Does not bruise/bleed easily.  Psychiatric/Behavioral: Negative.  Negative for confusion, decreased concentration, depression, sleep disturbance and  suicidal ideas. The patient is not nervous/anxious.      VITALS:  Blood pressure (!) 148/77, pulse 61, temperature 97.7 F (36.5 C), temperature source Oral, resp. rate 18, height 5' 9.39" (1.763 m), weight 233 lb 11.2 oz (106 kg), SpO2 96%.  Wt Readings from Last 3 Encounters:  11/20/23 233 lb 11.2 oz (106 kg)  10/19/23 235 lb (106.6 kg)  09/21/23 232 lb 1.6 oz (105.3 kg)    Body mass index is 34.12 kg/m.  Performance status (ECOG): 1 - Symptomatic but completely ambulatory  PHYSICAL EXAM:  Physical Exam Vitals and nursing note reviewed.  Constitutional:      General: He is not in acute distress.    Appearance: Normal appearance. He is normal weight. He is not ill-appearing, toxic-appearing or diaphoretic.  HENT:     Head: Normocephalic and atraumatic.     Right Ear: Tympanic membrane, ear canal and external ear normal. There is no impacted cerumen.     Left Ear: Tympanic membrane, ear canal and external ear normal. There is no impacted cerumen.     Nose: Nose normal. No congestion or rhinorrhea.     Mouth/Throat:     Mouth: Mucous membranes are moist.     Pharynx: Oropharynx is clear. No oropharyngeal exudate or posterior oropharyngeal erythema.  Eyes:     General: No scleral icterus.       Right eye: No discharge.        Left eye: No discharge.     Extraocular Movements: Extraocular movements intact.     Conjunctiva/sclera: Conjunctivae normal.     Pupils: Pupils are equal, round, and reactive to light.  Cardiovascular:     Rate and Rhythm: Normal rate and regular rhythm.     Pulses: Normal pulses.     Heart sounds: Normal heart sounds. No murmur heard.    No friction rub. No gallop.  Pulmonary:     Effort: Pulmonary effort is normal. No respiratory distress.     Breath sounds: No stridor. No wheezing, rhonchi or rales.  Chest:     Chest wall: No tenderness.  Abdominal:     General: Bowel sounds are normal. There is no distension.     Palpations: Abdomen is soft.  There is no hepatomegaly, splenomegaly or mass.     Tenderness: There is no abdominal tenderness. There is no right CVA tenderness, left CVA tenderness, guarding or rebound.     Hernia: No hernia is present.  Musculoskeletal:        General: Normal range of motion.     Cervical back: Normal range of motion and neck supple. No tenderness.     Right lower leg: No edema.     Left lower leg: Edema (mild) present.  Lymphadenopathy:     Cervical: No cervical adenopathy.  Upper Body:     Right upper body: No supraclavicular or axillary adenopathy.     Left upper body: No supraclavicular or axillary adenopathy.     Lower Body: No right inguinal adenopathy. No left inguinal adenopathy.  Skin:    General: Skin is warm and dry.     Coloration: Skin is not jaundiced.     Findings: Rash present. Scaling rash: Scattered benign appearing nevi.     Comments: Large healing scar of the left medial knee without evidence of recurrence. Scattered benign appearing nevi. Vitiligo of the skin Left inguinal area has a patchy erythematous rash which appears to be tinea cruris, it is partially resolved with the cream that he is using but we don't know what that is  Neurological:     General: No focal deficit present.     Mental Status: He is alert and oriented to person, place, and time. Mental status is at baseline.     Cranial Nerves: No cranial nerve deficit.     Sensory: No sensory deficit.     Motor: No weakness.     Coordination: Coordination normal.     Gait: Gait normal.     Deep Tendon Reflexes: Reflexes normal.  Psychiatric:        Mood and Affect: Mood normal.        Behavior: Behavior normal.        Thought Content: Thought content normal.        Judgment: Judgment normal.    LABS:      Latest Ref Rng & Units 11/20/2023   12:50 PM 10/19/2023    1:34 PM 09/21/2023   11:00 AM  CBC  WBC 4.0 - 10.5 K/uL 11.8  10.7  10.9   Hemoglobin 13.0 - 17.0 g/dL 08.6  57.8  46.9   Hematocrit 39.0 -  52.0 % 42.4  38.2  41.3   Platelets 150 - 400 K/uL 224  208  229       Latest Ref Rng & Units 11/20/2023   12:50 PM 10/19/2023    1:34 PM 09/21/2023   11:00 AM  CMP  Glucose 70 - 99 mg/dL 98  82  99   BUN 8 - 23 mg/dL 23  21  25    Creatinine 0.61 - 1.24 mg/dL 6.29  5.28  4.13   Sodium 135 - 145 mmol/L 140  138  138   Potassium 3.5 - 5.1 mmol/L 3.9  3.8  4.3   Chloride 98 - 111 mmol/L 104  102  101   CO2 22 - 32 mmol/L 26  25  26    Calcium 8.9 - 10.3 mg/dL 9.0  9.1  9.5   Total Protein 6.5 - 8.1 g/dL 6.7  6.7  7.0   Total Bilirubin 0.0 - 1.2 mg/dL 0.7  0.8  1.2   Alkaline Phos 38 - 126 U/L 93  79  91   AST 15 - 41 U/L 23  20  17    ALT 0 - 44 U/L 19  17  13     Lab Results  Component Value Date   LDH 140 02/01/2023   LDH 160 02/07/2022   Lab Results  Component Value Date   TSH 1.034 11/20/2023   T4TOTAL 6.9 11/20/2023   STUDIES:    HISTORY:   Past Medical History:  Diagnosis Date   Arthritis    gouty arthritis June 2023   History of colon polyps    History of kidney stones  Hypercholesteremia    Hypertension    Leukocytosis 06/16/2022   Malignant melanoma (HCC)    of the left foot with in-transit metastases   Peptic ulcer    Spinal stenosis at L4-L5 level    Stroke [facility])    CVA, 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 , 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     Past Surgical History:  Procedure Laterality Date   LUMBAR LAMINECTOMY/DECOMPRESSION MICRODISCECTOMY N/A 01/26/2022   Procedure: Microlumbar decompression Lumbar four-five, lateral mass fusion with autograft and allograft bone;  Surgeon: Jene Every, MD;  Location: [facility] OR;  Service: Orthopedics;  Laterality: N/A;   PARTIAL GASTRECTOMY     TOTAL HIP ARTHROPLASTY Right 06/18/2018   Procedure: RIGHT TOTAL HIP ARTHROPLASTY ANTERIOR APPROACH;  Surgeon: Durene Romans, MD;  Location: [facility] ORS;  Service: Orthopedics;  Laterality: Right;    Family History  Problem Relation Age of Onset   Lung cancer Sister     Melanoma Brother     Social History:  reports that he has quit smoking. He has never used smokeless tobacco. He reports that he does not currently use alcohol. He reports that he does not use drugs.The patient is alone today.  Allergies:  Allergies  Allergen Reactions   Morphine Itching    Patient states it is tolerable itching    Current Medications: Current Outpatient Medications  Medication Sig Dispense Refill   amLODipine (NORVASC) 5 MG tablet Take 5 mg by mouth daily.     Calcium Carbonate (CALCIUM 600 PO) Take 1 tablet by mouth 2 (two) times daily.     clopidogrel (PLAVIX) 75 MG tablet Take 1 tablet by mouth daily.     docusate sodium (COLACE) 100 MG capsule 1 capsule 2 TIMES DAILY (route: oral)     famotidine (PEPCID) 40 MG tablet TAKE 1 TABLET EVERY DAY 90 tablet 3   folic acid (FOLVITE) 400 MCG tablet Take 400 mcg by mouth daily.     gabapentin (NEURONTIN) 300 MG capsule Take 1 capsule (300 mg total) by mouth 3 (three) times daily. 270 capsule 3   hydrochlorothiazide (HYDRODIURIL) 12.5 MG tablet Take 12.5 mg by mouth daily.     lisinopril (ZESTRIL) 30 MG tablet Take 30 mg by mouth daily.     Multiple Vitamin (MULTIVITAMIN WITH MINERALS) TABS tablet Take 1 tablet by mouth in the morning.     mupirocin ointment (BACTROBAN) 2 % Apply topically.     neomycin-polymyxin-hydrocortisone (CORTISPORIN) OTIC solution Place 3 drops into the right ear 4 (four) times daily. 10 mL 1   nystatin (MYCOSTATIN/NYSTOP) powder Apply 1 Application topically 3 (three) times daily. 15 g 0   Omega-3 1000 MG CAPS Take by mouth.     ondansetron (ZOFRAN) 4 MG tablet Take 1 tablet (4 mg total) by mouth every 4 (four) hours as needed for nausea. 90 tablet 3   oxyCODONE (OXY IR/ROXICODONE) 5 MG immediate release tablet Take 1 tablet (5 mg total) by mouth every 6 (six) hours as needed for severe pain. (Patient not taking: Reported on 07/27/2023) 30 tablet 0   polyethylene glycol (MIRALAX / GLYCOLAX) 17 g  packet Take 17 g by mouth daily. 14 each 0   pravastatin (PRAVACHOL) 40 MG tablet Take 40 mg by mouth daily.     prochlorperazine (COMPAZINE) 10 MG tablet Take 1 tablet (10 mg total) by mouth every 6 (six) hours as needed for nausea or vomiting. 90 tablet 3   traZODone (DESYREL) 50 MG tablet TAKE 1 TABLET AT  BEDTIME AS NEEDED FOR SLEEP 30 tablet 11   triamcinolone cream (KENALOG) 0.1 % Apply topically 2 (two) times daily.     No current facility-administered medications for this visit.    I,Jasmine M Lassiter,acting as a scribe for [name], MD.,have documented all relevant documentation on the behalf of [name], MD,as directed by  [name], MD while in the presence of [name], MD.

## 2023-11-19 ENCOUNTER — Encounter: Payer: Self-pay | Admitting: Oncology

## 2023-11-20 ENCOUNTER — Encounter: Payer: Self-pay | Admitting: Oncology

## 2023-11-20 ENCOUNTER — Inpatient Hospital Stay: Payer: Medicare HMO | Attending: Oncology | Admitting: Oncology

## 2023-11-20 ENCOUNTER — Inpatient Hospital Stay: Payer: Medicare HMO

## 2023-11-20 ENCOUNTER — Telehealth: Payer: Self-pay | Admitting: Oncology

## 2023-11-20 ENCOUNTER — Other Ambulatory Visit: Payer: Self-pay | Admitting: Oncology

## 2023-11-20 VITALS — BP 148/77 | HR 61 | Temp 97.7°F | Resp 18 | Ht 69.39 in | Wt 233.7 lb

## 2023-11-20 DIAGNOSIS — C792 Secondary malignant neoplasm of skin: Secondary | ICD-10-CM | POA: Diagnosis not present

## 2023-11-20 DIAGNOSIS — C439 Malignant melanoma of skin, unspecified: Secondary | ICD-10-CM

## 2023-11-20 DIAGNOSIS — C4372 Malignant melanoma of left lower limb, including hip: Secondary | ICD-10-CM | POA: Insufficient documentation

## 2023-11-20 DIAGNOSIS — Z5112 Encounter for antineoplastic immunotherapy: Secondary | ICD-10-CM | POA: Insufficient documentation

## 2023-11-20 DIAGNOSIS — Z79620 Long term (current) use of immunosuppressive biologic: Secondary | ICD-10-CM | POA: Insufficient documentation

## 2023-11-20 DIAGNOSIS — B356 Tinea cruris: Secondary | ICD-10-CM

## 2023-11-20 LAB — CBC WITH DIFFERENTIAL (CANCER CENTER ONLY)
Abs Immature Granulocytes: 0.03 10*3/uL (ref 0.00–0.07)
Basophils Absolute: 0 10*3/uL (ref 0.0–0.1)
Basophils Relative: 0 %
Eosinophils Absolute: 0.6 10*3/uL — ABNORMAL HIGH (ref 0.0–0.5)
Eosinophils Relative: 5 %
HCT: 42.4 % (ref 39.0–52.0)
Hemoglobin: 14.1 g/dL (ref 13.0–17.0)
Immature Granulocytes: 0 %
Lymphocytes Relative: 24 %
Lymphs Abs: 2.8 10*3/uL (ref 0.7–4.0)
MCH: 28.9 pg (ref 26.0–34.0)
MCHC: 33.3 g/dL (ref 30.0–36.0)
MCV: 86.9 fL (ref 80.0–100.0)
Monocytes Absolute: 1 10*3/uL (ref 0.1–1.0)
Monocytes Relative: 8 %
Neutro Abs: 7.4 10*3/uL (ref 1.7–7.7)
Neutrophils Relative %: 63 %
Platelet Count: 224 10*3/uL (ref 150–400)
RBC: 4.88 MIL/uL (ref 4.22–5.81)
RDW: 12.7 % (ref 11.5–15.5)
WBC Count: 11.8 10*3/uL — ABNORMAL HIGH (ref 4.0–10.5)
nRBC: 0 % (ref 0.0–0.2)
nRBC: 0 /100{WBCs}

## 2023-11-20 LAB — TSH: TSH: 1.034 u[IU]/mL (ref 0.350–4.500)

## 2023-11-20 LAB — CMP (CANCER CENTER ONLY)
ALT: 19 U/L (ref 0–44)
AST: 23 U/L (ref 15–41)
Albumin: 4.5 g/dL (ref 3.5–5.0)
Alkaline Phosphatase: 93 U/L (ref 38–126)
Anion gap: 10 (ref 5–15)
BUN: 23 mg/dL (ref 8–23)
CO2: 26 mmol/L (ref 22–32)
Calcium: 9 mg/dL (ref 8.9–10.3)
Chloride: 104 mmol/L (ref 98–111)
Creatinine: 1.14 mg/dL (ref 0.61–1.24)
GFR, Estimated: >60 mL/min (ref >60)
Glucose, Bld: 98 mg/dL (ref 70–99)
Potassium: 3.9 mmol/L (ref 3.5–5.1)
Sodium: 140 mmol/L (ref 135–145)
Total Bilirubin: 0.7 mg/dL (ref 0.0–1.2)
Total Protein: 6.7 g/dL (ref 6.5–8.1)

## 2023-11-20 MED ORDER — HEPARIN SOD (PORK) LOCK FLUSH 100 UNIT/ML IV SOLN
500.0000 [IU] | Freq: Once | INTRAVENOUS | Status: AC | PRN
Start: 2023-11-20 — End: 2023-11-20
  Administered 2023-11-20: 500 [IU]

## 2023-11-20 MED ORDER — NYSTATIN 100000 UNIT/GM EX POWD: 1.0000 | Freq: Three times a day (TID) | CUTANEOUS | 0 refills | Status: DC

## 2023-11-20 MED ORDER — SODIUM CHLORIDE 0.9 % IV SOLN
480.0000 mg | Freq: Once | INTRAVENOUS | Status: AC
  Administered 2023-11-20: 480 mg via INTRAVENOUS
  Filled 2023-11-20: qty 48

## 2023-11-20 MED ORDER — SODIUM CHLORIDE 0.9 % IV SOLN: Freq: Once | INTRAVENOUS | Status: AC

## 2023-11-20 MED ORDER — SODIUM CHLORIDE 0.9% FLUSH
10.0000 mL | INTRAVENOUS | Status: DC | PRN
  Administered 2023-11-20: 10 mL

## 2023-11-20 NOTE — Patient Instructions (Signed)
 Nivolumab Injection What is this medication? NIVOLUMAB (nye VOL ue mab) treats some types of cancer. It works by helping your immune system slow or stop the spread of cancer cells. It is a monoclonal antibody. This medicine may be used for other purposes; ask your health care provider or pharmacist if you have questions. COMMON BRAND NAME(S): Opdivo What should I tell my care team before I take this medication? They need to know if you have any of these conditions: Allogeneic stem cell transplant (uses someone else's stem cells) Autoimmune diseases, such as Crohn disease, ulcerative colitis, lupus History of chest radiation Nervous system problems, such as Guillain-Barre syndrome or myasthenia gravis Organ transplant An unusual or allergic reaction to nivolumab, other medications, foods, dyes, or preservatives Pregnant or trying to get pregnant Breast-feeding How should I use this medication? This medication is infused into a vein. It is given in a hospital or clinic setting. A special MedGuide will be given to you before each treatment. Be sure to read this information carefully each time. Talk to your care team about the use of this medication in children. While it may be prescribed for children as young as 12 years for selected conditions, precautions do apply. Overdosage: If you think you have taken too much of this medicine contact a poison control center or emergency room at once. NOTE: This medicine is only for you. Do not share this medicine with others. What if I miss a dose? Keep appointments for follow-up doses. It is important not to miss your dose. Call your care team if you are unable to keep an appointment. What may interact with this medication? Interactions have not been studied. This list may not describe all possible interactions. Give your health care provider a list of all the medicines, herbs, non-prescription drugs, or dietary supplements you use. Also tell them if you  smoke, drink alcohol, or use illegal drugs. Some items may interact with your medicine. What should I watch for while using this medication? Your condition will be monitored carefully while you are receiving this medication. You may need blood work while taking this medication. This medication may cause serious skin reactions. They can happen weeks to months after starting the medication. Contact your care team right away if you notice fevers or flu-like symptoms with a rash. The rash may be red or purple and then turn into blisters or peeling of the skin. You may also notice a red rash with swelling of the face, lips, or lymph nodes in your neck or under your arms. Tell your care team right away if you have any change in your eyesight. Talk to your care team if you are pregnant or think you might be pregnant. A negative pregnancy test is required before starting this medication. A reliable form of contraception is recommended while taking this medication and for 5 months after the last dose. Talk to your care team about effective forms of contraception. Do not breast-feed while taking this medication and for 5 months after the last dose. What side effects may I notice from receiving this medication? Side effects that you should report to your care team as soon as possible: Allergic reactions--skin rash, itching, hives, swelling of the face, lips, tongue, or throat Dry cough, shortness of breath or trouble breathing Eye pain, redness, irritation, or discharge with blurry or decreased vision Heart muscle inflammation--unusual weakness or fatigue, shortness of breath, chest pain, fast or irregular heartbeat, dizziness, swelling of the ankles, feet, or hands Hormone  gland problems--headache, sensitivity to light, unusual weakness or fatigue, dizziness, fast or irregular heartbeat, increased sensitivity to cold or heat, excessive sweating, constipation, hair loss, increased thirst or amount of urine,  tremors or shaking, irritability Infusion reactions--chest pain, shortness of breath or trouble breathing, feeling faint or lightheaded Kidney injury (glomerulonephritis)--decrease in the amount of urine, red or dark brown urine, foamy or bubbly urine, swelling of the ankles, hands, or feet Liver injury--right upper belly pain, loss of appetite, nausea, light-colored stool, dark yellow or brown urine, yellowing skin or eyes, unusual weakness or fatigue Pain, tingling, or numbness in the hands or feet, muscle weakness, change in vision, confusion or trouble speaking, loss of balance or coordination, trouble walking, seizures Rash, fever, and swollen lymph nodes Redness, blistering, peeling, or loosening of the skin, including inside the mouth Sudden or severe stomach pain, bloody diarrhea, fever, nausea, vomiting Side effects that usually do not require medical attention (report these to your care team if they continue or are bothersome): Bone, joint, or muscle pain Diarrhea Fatigue Loss of appetite Nausea Skin rash This list may not describe all possible side effects. Call your doctor for medical advice about side effects. You may report side effects to FDA at 1-800-FDA-1088. Where should I keep my medication? This medication is given in a hospital or clinic. It will not be stored at home. NOTE: This sheet is a summary. It may not cover all possible information. If you have questions about this medicine, talk to your doctor, pharmacist, or health care provider.  2024 Elsevier/Gold Standard (2021-12-05 00:00:00)   [facility] [place] [facility]  Discharge Instructions: Thank you for choosing [facility] to provide your oncology and hematology care.  If you have a lab appointment with the Cancer Center, please go directly to the Cancer Center and check in at the registration area.   Wear comfortable clothing and clothing appropriate for easy  access to any Portacath or PICC line.   We strive to give you quality time with your provider. You may need to reschedule your appointment if you arrive late (15 or more minutes).  Arriving late affects you and other patients whose appointments are after yours.  Also, if you miss three or more appointments without notifying the office, you may be dismissed from the clinic at the provider's discretion.      For prescription refill requests, have your pharmacy contact our office and allow 72 hours for refills to be completed.    Today you received the following chemotherapy and/or immunotherapy agents NIVOLUMAB      To help prevent nausea and vomiting after your treatment, we encourage you to take your nausea medication as directed.  BELOW ARE SYMPTOMS THAT SHOULD BE REPORTED IMMEDIATELY: *FEVER GREATER THAN 100.4 F (38 C) OR HIGHER *CHILLS OR SWEATING *NAUSEA AND VOMITING THAT IS NOT CONTROLLED WITH YOUR NAUSEA MEDICATION *UNUSUAL SHORTNESS OF BREATH *UNUSUAL BRUISING OR BLEEDING *URINARY PROBLEMS (pain or burning when urinating, or frequent urination) *BOWEL PROBLEMS (unusual diarrhea, constipation, pain near the anus) TENDERNESS IN MOUTH AND THROAT WITH OR WITHOUT PRESENCE OF ULCERS (sore throat, sores in mouth, or a toothache) UNUSUAL RASH, SWELLING OR PAIN  UNUSUAL VAGINAL DISCHARGE OR ITCHING   Items with * indicate a potential emergency and should be followed up as soon as possible or go to the Emergency Department if any problems should occur.  Please show the CHEMOTHERAPY ALERT CARD or IMMUNOTHERAPY ALERT CARD at  check-in to the Emergency Department and triage nurse.  Should you have questions after your visit or need to cancel or reschedule your appointment, please contact [facility] [place] [facility]  Dept: [phone]  and follow the prompts.  Office hours are 8:00 a.m. to 4:30 p.m. Monday - Friday. Please note that voicemails left after  4:00 p.m. may not be returned until the following business day.  We are closed weekends and major holidays. You have access to a nurse at all times for urgent questions. Please call the main number to the clinic Dept: [phone] and follow the prompts.  For any non-urgent questions, you may also contact your provider using MyChart. We now offer e-Visits for anyone 58 and older to request care online for non-urgent symptoms. For details visit [email or web address].   Also download the MyChart app! Go to the app store, search "MyChart", open the app, select [place], and log in with your MyChart username and password.

## 2023-11-20 NOTE — Telephone Encounter (Signed)
 11/20/23 Spoke with patient and confirmed next appt.

## 2023-11-21 ENCOUNTER — Other Ambulatory Visit: Payer: Self-pay

## 2023-11-21 LAB — T4: T4, Total: 6.9 ug/dL (ref 4.5–12.0)

## 2023-11-22 ENCOUNTER — Other Ambulatory Visit: Payer: Self-pay

## 2023-11-26 ENCOUNTER — Encounter: Payer: Self-pay | Admitting: Oncology

## 2023-11-29 DIAGNOSIS — L570 Actinic keratosis: Secondary | ICD-10-CM | POA: Diagnosis not present

## 2023-11-29 DIAGNOSIS — C44519 Basal cell carcinoma of skin of other part of trunk: Secondary | ICD-10-CM | POA: Diagnosis not present

## 2023-11-29 DIAGNOSIS — Z85820 Personal history of malignant melanoma of skin: Secondary | ICD-10-CM | POA: Diagnosis not present

## 2023-11-29 DIAGNOSIS — L814 Other melanin hyperpigmentation: Secondary | ICD-10-CM | POA: Diagnosis not present

## 2023-11-29 DIAGNOSIS — L821 Other seborrheic keratosis: Secondary | ICD-10-CM | POA: Diagnosis not present

## 2023-11-29 DIAGNOSIS — D225 Melanocytic nevi of trunk: Secondary | ICD-10-CM | POA: Diagnosis not present

## 2023-12-08 ENCOUNTER — Other Ambulatory Visit: Payer: Self-pay

## 2023-12-11 ENCOUNTER — Other Ambulatory Visit: Payer: Self-pay

## 2023-12-18 ENCOUNTER — Encounter: Payer: Self-pay | Admitting: Oncology

## 2023-12-18 ENCOUNTER — Other Ambulatory Visit: Payer: Self-pay | Admitting: Oncology

## 2023-12-18 ENCOUNTER — Inpatient Hospital Stay

## 2023-12-18 ENCOUNTER — Inpatient Hospital Stay (HOSPITAL_BASED_OUTPATIENT_CLINIC_OR_DEPARTMENT_OTHER): Admitting: Oncology

## 2023-12-18 VITALS — BP 143/76 | HR 68 | Temp 98.1°F | Resp 16 | Ht 69.39 in | Wt 232.0 lb

## 2023-12-18 DIAGNOSIS — C792 Secondary malignant neoplasm of skin: Secondary | ICD-10-CM

## 2023-12-18 DIAGNOSIS — C439 Malignant melanoma of skin, unspecified: Secondary | ICD-10-CM

## 2023-12-18 DIAGNOSIS — C4372 Malignant melanoma of left lower limb, including hip: Secondary | ICD-10-CM | POA: Diagnosis not present

## 2023-12-18 DIAGNOSIS — R918 Other nonspecific abnormal finding of lung field: Secondary | ICD-10-CM

## 2023-12-18 DIAGNOSIS — B372 Candidiasis of skin and nail: Secondary | ICD-10-CM

## 2023-12-18 DIAGNOSIS — Z79620 Long term (current) use of immunosuppressive biologic: Secondary | ICD-10-CM | POA: Diagnosis not present

## 2023-12-18 DIAGNOSIS — Z5112 Encounter for antineoplastic immunotherapy: Secondary | ICD-10-CM | POA: Diagnosis not present

## 2023-12-18 LAB — CBC WITH DIFFERENTIAL (CANCER CENTER ONLY)
Abs Immature Granulocytes: 0.03 10*3/uL (ref 0.00–0.07)
Basophils Absolute: 0 10*3/uL (ref 0.0–0.1)
Basophils Relative: 0 %
Eosinophils Absolute: 0.3 10*3/uL (ref 0.0–0.5)
Eosinophils Relative: 3 %
HCT: 41.8 % (ref 39.0–52.0)
Hemoglobin: 14.1 g/dL (ref 13.0–17.0)
Immature Granulocytes: 0 %
Lymphocytes Relative: 21 %
Lymphs Abs: 2.5 10*3/uL (ref 0.7–4.0)
MCH: 29.4 pg (ref 26.0–34.0)
MCHC: 33.7 g/dL (ref 30.0–36.0)
MCV: 87.1 fL (ref 80.0–100.0)
Monocytes Absolute: 0.8 10*3/uL (ref 0.1–1.0)
Monocytes Relative: 7 %
Neutro Abs: 8 10*3/uL — ABNORMAL HIGH (ref 1.7–7.7)
Neutrophils Relative %: 69 %
Platelet Count: 226 10*3/uL (ref 150–400)
RBC: 4.8 MIL/uL (ref 4.22–5.81)
RDW: 13 % (ref 11.5–15.5)
WBC Count: 11.7 10*3/uL — ABNORMAL HIGH (ref 4.0–10.5)
nRBC: 0 % (ref 0.0–0.2)
nRBC: 0 /100{WBCs}

## 2023-12-18 LAB — CMP (CANCER CENTER ONLY)
ALT: 20 U/L (ref 0–44)
AST: 24 U/L (ref 15–41)
Albumin: 4.5 g/dL (ref 3.5–5.0)
Alkaline Phosphatase: 90 U/L (ref 38–126)
Anion gap: 11 (ref 5–15)
BUN: 23 mg/dL (ref 8–23)
CO2: 26 mmol/L (ref 22–32)
Calcium: 9 mg/dL (ref 8.9–10.3)
Chloride: 103 mmol/L (ref 98–111)
Creatinine: 1.18 mg/dL (ref 0.61–1.24)
GFR, Estimated: >60 mL/min (ref >60)
Glucose, Bld: 136 mg/dL — ABNORMAL HIGH (ref 70–99)
Potassium: 4.1 mmol/L (ref 3.5–5.1)
Sodium: 140 mmol/L (ref 135–145)
Total Bilirubin: 0.8 mg/dL (ref 0.0–1.2)
Total Protein: 6.8 g/dL (ref 6.5–8.1)

## 2023-12-18 LAB — TSH: TSH: 1.007 u[IU]/mL (ref 0.350–4.500)

## 2023-12-18 MED ORDER — HEPARIN SOD (PORK) LOCK FLUSH 100 UNIT/ML IV SOLN
500.0000 [IU] | Freq: Once | INTRAVENOUS | Status: AC | PRN
  Administered 2023-12-18: 500 [IU]

## 2023-12-18 MED ORDER — SODIUM CHLORIDE 0.9 % IV SOLN: Freq: Once | INTRAVENOUS | Status: AC

## 2023-12-18 MED ORDER — SODIUM CHLORIDE 0.9 % IV SOLN
480.0000 mg | Freq: Once | INTRAVENOUS | Status: AC
  Administered 2023-12-18: 480 mg via INTRAVENOUS
  Filled 2023-12-18: qty 48

## 2023-12-18 MED ORDER — FLUCONAZOLE 200 MG PO TABS: 200.0000 mg | ORAL_TABLET | Freq: Every day | ORAL | 1 refills | Status: DC

## 2023-12-18 MED ORDER — SODIUM CHLORIDE 0.9% FLUSH
10.0000 mL | INTRAVENOUS | Status: DC | PRN
  Administered 2023-12-18: 10 mL

## 2023-12-18 NOTE — Progress Notes (Signed)
 [facility]  [address] [place],  [place]  [address] [phone]  Clinic Day: 12/18/2023 Referring physician: Rome Cluck, MD  ASSESSMENT & PLAN:  Assessment: Malignant melanoma of the left foot He had a punch biopsy and this revealed a Breslow thickness of 1.6 mm and Clark's level 4 with ulceration for a T2b N0 M0 clinically.  We found no distant metastasis on PET scan, but he did have metastasis to the left lower extremity.  His tumor is positive for BRAF and PDL1 mutation. He had this treated with wide local excision.   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   Multiple lesions of the lung These were suspicious for metastatic disease but resolved on the CT scan in December, 2023. PET scan in June was negative, but we cannot absolutely rule out metastatic disease since these lesions were too small to biopsy. We discussed the fact that the resolution of these lesions could represent a response to treatment, or may not have been malignant at all.  We think it is more likely the former, and so I recommend continuation of therapy.  We have had several discussions about this.  His latest scan from January 2025 shows resolution of a multitude of pulmonary nodules.  Skin Cancer of the Chest Wall This was diagnosed in May, 2025 and pathology is pending but this is not suspicious for a melanoma.   Spinal stenosis at L4-5 He has had surgical decompression and resection of a synovial cyst in June 2023.   Leukocytosis He has had chronic leukocytosis and we did not find an explanation but the [facility] continue to fluctuate up and down.   Lymphedema of the Left Lower Extremity We discussed this diagnosis and the limited approaches available. This is improved.  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   Tinea Cruris This is mild and partially treated with cream from the dermatologist. I ordered mycostatin  powder last month, it has improved but it is not clear. I will order diflucan  200 mg daily for 10 days. Once it is clear, he needs to continue to use power or cornstarch.   Plan: He informed me that he had a cancerous spot removed by dermatology and he has a follow-up on 05/05 at which time they will do a resection. I will look for his pathology report on this as he is not sure what type of cancer it was. I inquired about his rash in the groin area and it persists despite cream from his dermatologist, nystatin  powder, and Vaseline. I explained that he need to keep the area dry but will order diflucan  200 mg daily for 10 days since it has persisted for so long. Once it is clear, he needs to continue to use power or cornstarch. His day 1 cycle 24 of Nivolumab  is scheduled on 12/18/2023. He has a chronic elevated WBC of 11.7, hemoglobin of 14.1, and platelet count of 226,000. His CMP is normal other than a non-fasting glucose of 136. His TSH and T4 today are pending. I will see him back in 4 weeks with CBC, CMP, TSH, and T4. The patient understands the plans discussed today and is in agreement with them.  He knows to contact our office if he develops concerns prior to  his next appointment.  I provided 12 minutes of face-to-face time during this encounter and > 50% was spent counseling as documented under my assessment and plan.   Nolia Baumgartner, MD  [facility] - A DEPT OF [facility] [address] [place] [address] Dept: [phone] Dept Fax: [phone]  No orders of the defined types were placed in this encounter.   CHIEF COMPLAINT:  CC: Malignant melanoma   Current Treatment:  Evaluation and immunotherapy   HISTORY OF PRESENT ILLNESS:  [name] is a 78 y.o.  male with a history of malignant melanoma who is referred in consultation with Dr. Trina Fujita for assessment and management.  He had a lesion of the dorsum of the left foot in September 2022 which appeared to be a granuloma and was treated in the office.  He later developed a another lesion lateral to this on the dorsum of the left foot which was not hyperpigmented but did bleed easily.  He also has several lesions of his left medial calf which are nodular and span approximately 6 cm.  He has had some increased edema of the left lower leg.  He was evaluated by dermatology on June 5, who felt this could be a squamous cell carcinoma versus tinea or a drug reaction.  He had biopsies done of both the foot lesion and the Lesion.  Pathology came back that the left dorsal foot shave biopsy revealed a malignant melanoma possibly a primary nodular ulcerated melanoma which had a Breslow thickness of at least 1.6 mm and a Clark's level 4.  Margins are positive as this was a shave biopsy.  Ulceration is present but no satellitosis.  The mitotic index is 4/mm and no lymphovascular invasion was identified.  No brisk tumor infiltrating lymphocytes are seen and tumor regression is absent.  This is felt to be at least a T2b lesion and wide excision was recommended.  The lesion of the left medial calf revealed a dermal map melanoma most consistent with metastatic disease. BRAF and PDL1 are positive.  This information was not available when the patient was admitted for back surgery on June 8 and he had a microlumbar decompression at L4-5 for spinal stenosis.  He was also found to have a synovial cyst at that time which was resected and benign.  He was readmitted several days later for acute pain of the right elbow and both arms.  Aspiration was obtained and this was diagnosed as gouty arthritis.  He did have temporary rehab after his back surgery.  His left leg was evaluated for deep venous thrombosis and was negative.  While he  was recuperating from his surgery he had an x-ray which was abnormal which led to a CT scan which revealed at least 3 lung lesions.  MRI of the brain was negative and he is now referred for management of the malignant melanoma. PET scan on 01/31/2023  revealed interval resolution of previously demonstrated hypermetabolic activity within the left foot, no evidence of local recurrence of metastatic disease, resolution of right middle lobe airspace diseases seen on most recent CT. No suspicious pulmonary nodularity, new opacification of the maxillary sinus with mild peripheral hypermetabolic activity, likely inflammatory, and aortic atherosclerosis.  CT chest, abdomen, and pelvis done on 09/07/2023 that revealed the previously present multitude of the right middle lobe pulmonary micro nodule and nodules have essentially resolved, with a 3 mm residual nodule likely represent sequela of  infectious or inflammatory process and no evidence of metastatic disease to the thorax, abdomen, or pelvis.   Oncology History  Malignant melanoma, metastatic (HCC)  01/23/2022 Cancer Staging   Staging form: Melanoma of the Skin, AJCC 8th Edition - Clinical stage from 01/23/2022: Stage III (cT2b, cN1c, cM0) - Signed by Nolia Baumgartner, MD on 04/07/2022 Histopathologic type: Malignant melanoma, NOS (except juvenile melanoma M-8770/[id number]) Stage prefix: Initial diagnosis Laterality: Left Lymph-vascular invasion (LVI): LVI not present (absent)/not identified Diagnostic confirmation: Positive histology Specimen type: Biopsy / Limited Resection Staged by: Managing physician Mitotic count: 4 Mitotic unit: mm2 Clark's level: Level IV Tumor-infiltrating lymphocytes: Present and non-brisk Breslow depth (mm): 16 Ulceration of the epidermis: Yes Microsatellites: No Primary tumor regression: Absent Intransit/satellite metastasis: In-transit Lymph node clinically or radiologically detected: No Microscopic confirmation of metastasis:  No Sentinel lymph node biopsy performed: No Completion or therapeutic lymph node dissection performed: No Matted nodes: No Stage used in treatment planning: Yes National guidelines used in treatment planning: Yes Type of national guideline used in treatment planning: NCCN   01/31/2022 Initial Diagnosis   Malignant melanoma, metastatic (HCC)   04/14/2022 -  Chemotherapy   Patient is on Treatment Plan : MELANOMA Nivolumab  (480) q28d (changed from q14d on 12/4)     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)  02/28/2022 Cancer Staging   Staging form: Melanoma of the Skin, AJCC 8th Edition - Clinical stage from 02/28/2022: Stage IV (cT2b(m), cN1c, cM1a) - Signed by Nolia Baumgartner, MD on 04/07/2022 Histopathologic type: Malignant melanoma, NOS (except juvenile melanoma M-8770/[id number]) Stage prefix: Initial diagnosis Laterality: Left Multiple tumors: Yes Lymph-vascular invasion (LVI): LVI present/identified, NOS Diagnostic confirmation: Positive histology Specimen type: Excision Staged by: Managing physician Intransit/satellite metastasis: Both in-transit and satellite Prognostic indicators: 02/28/22:  Wide excision of primary of foot - no residual melanoma,  Excision of skin of calf consistent with intransit mets of dermal/subcuticular   Melanoma with LVI, metastatic disease with multifocal lesions and multifocal positive margins 03/24/22: Re-excision with a few atypical melanocytes, no melanoma and clear margins   04/07/2022 Initial Diagnosis   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 He is therefore on maintenance immunotherapy with nivolumab . Patient states that he feels well and has no complaints of pain. He  informed me that he had a cancerous spot removed by dermatology and he has a follow-up on 05/05 at which time they will do a resection. I will look for his pathology report on this as he is not sure what type of cancer it was. I inquired about his rash in the groin area and it persists despite cream from his dermatologist, nystatin  powder, and Vaseline. I explained that he needs to keep the area dry but will order diflucan  200 mg daily for 10 days since it has persisted for so long. Once it is clear, he needs to continue to use power or cornstarch. His day 1 cycle 24 of Nivolumab  is scheduled on 12/18/2023. He has a chronic elevated WBC of 11.7, hemoglobin of 14.1, and platelet count of 226,000. His CMP is normal other than a non-glucose of 136. His TSH and T4 today are pending. I will see him back in 4 weeks  with CBC, CMP, TSH, and T4. He denies fever, chills, night sweats, or other signs of infection. He denies cardiorespiratory and gastrointestinal issues. He  denies pain. His appetite is very good and her weight has decreased 1 pounds over last 4 weeks.    REVIEW OF SYSTEMS:  Review of Systems  Constitutional: Negative.  Negative for appetite change, chills, diaphoresis, fatigue, fever and unexpected weight change.  HENT:  Negative.  Negative for hearing loss, lump/mass, mouth sores, nosebleeds, sore throat, tinnitus, trouble swallowing and voice change.   Eyes: Negative.  Negative for eye problems and icterus.  Respiratory:  Negative for chest tightness, cough, hemoptysis, shortness of breath and wheezing.   Cardiovascular: Negative.  Negative for chest pain, leg swelling and palpitations.  Gastrointestinal:  Positive for diarrhea (on and off). Negative for abdominal distention, abdominal pain, blood in stool, constipation, nausea, rectal pain and vomiting.  Endocrine: Negative.  Negative for hot flashes.  Genitourinary: Negative.  Negative for bladder incontinence, difficulty urinating, dyspareunia,  dysuria, frequency, hematuria, nocturia, pelvic pain and penile discharge.   Musculoskeletal: Negative.  Negative for arthralgias, back pain, flank pain, gait problem, myalgias, neck pain and neck stiffness.  Skin:  Positive for rash (left groin). Negative for itching and wound.  Neurological: Negative.  Negative for dizziness, extremity weakness, gait problem, headaches, light-headedness, numbness, seizures and speech difficulty.  Hematological: Negative.  Negative for adenopathy. Does not bruise/bleed easily.  Psychiatric/Behavioral: Negative.  Negative for confusion, decreased concentration, depression, sleep disturbance and suicidal ideas. The patient is not nervous/anxious.      VITALS:  Blood pressure (!) 143/76, pulse 68, temperature 98.1 F (36.7 C), temperature source Oral, resp. rate 16, height 5' 9.39" (1.763 m), weight 232 lb (105.2 kg), SpO2 97%.  Wt Readings from Last 3 Encounters:  12/18/23 232 lb (105.2 kg)  11/20/23 233 lb 11.2 oz (106 kg)  10/19/23 235 lb (106.6 kg)    Body mass index is 33.88 kg/m.  Performance status (ECOG): 1 - Symptomatic but completely ambulatory  PHYSICAL EXAM:  Physical Exam Vitals and nursing note reviewed.  Constitutional:      General: He is not in acute distress.    Appearance: Normal appearance. He is normal weight. He is not ill-appearing, toxic-appearing or diaphoretic.  HENT:     Head: Normocephalic and atraumatic.     Right Ear: Tympanic membrane, ear canal and external ear normal. There is no impacted cerumen.     Left Ear: Tympanic membrane, ear canal and external ear normal. There is no impacted cerumen.     Nose: Nose normal. No congestion or rhinorrhea.     Mouth/Throat:     Mouth: Mucous membranes are moist.     Pharynx: Oropharynx is clear. No oropharyngeal exudate or posterior oropharyngeal erythema.  Eyes:     General: No scleral icterus.       Right eye: No discharge.        Left eye: No discharge.     Extraocular  Movements: Extraocular movements intact.     Conjunctiva/sclera: Conjunctivae normal.     Pupils: Pupils are equal, round, and reactive to light.  Cardiovascular:     Rate and Rhythm: Normal rate and regular rhythm.     Pulses: Normal pulses.     Heart sounds: Normal heart sounds. No murmur heard.    No friction rub. No gallop.  Pulmonary:     Effort: Pulmonary effort is normal. No respiratory distress.     Breath sounds: No stridor. No  wheezing, rhonchi or rales.  Chest:     Chest wall: No tenderness.     Comments: Small scaly slightly erythematous lesion of the mid sternum about one cm across Abdominal:     General: Bowel sounds are normal. There is no distension.     Palpations: Abdomen is soft. There is no hepatomegaly, splenomegaly or mass.     Tenderness: There is no abdominal tenderness. There is no right CVA tenderness, left CVA tenderness, guarding or rebound.     Hernia: No hernia is present.  Musculoskeletal:        General: Normal range of motion.     Cervical back: Normal range of motion and neck supple. No tenderness.     Right lower leg: No edema.     Left lower leg: Edema (mild) present.  Lymphadenopathy:     Cervical: No cervical adenopathy.     Upper Body:     Right upper body: No supraclavicular or axillary adenopathy.     Left upper body: No supraclavicular or axillary adenopathy.     Lower Body: No right inguinal adenopathy. No left inguinal adenopathy.  Skin:    General: Skin is warm and dry.     Coloration: Skin is not jaundiced.     Findings: Rash present. Scaling rash: Scattered benign appearing nevi.     Comments: Large healing scar of the left medial knee without evidence of recurrence. Scattered benign appearing nevi. Vitiligo of the skin  Neurological:     General: No focal deficit present.     Mental Status: He is alert and oriented to person, place, and time. Mental status is at baseline.     Cranial Nerves: No cranial nerve deficit.     Sensory:  No sensory deficit.     Motor: No weakness.     Coordination: Coordination normal.     Gait: Gait normal.     Deep Tendon Reflexes: Reflexes normal.  Psychiatric:        Mood and Affect: Mood normal.        Behavior: Behavior normal.        Thought Content: Thought content normal.        Judgment: Judgment normal.    LABS:      Latest Ref Rng & Units 12/18/2023   12:57 PM 11/20/2023   12:50 PM 10/19/2023    1:34 PM  CBC  WBC 4.0 - 10.5 K/uL 11.7  11.8  10.7   Hemoglobin 13.0 - 17.0 g/dL 40.9  81.1  91.4   Hematocrit 39.0 - 52.0 % 41.8  42.4  38.2   Platelets 150 - 400 K/uL 226  224  208       Latest Ref Rng & Units 12/18/2023   12:57 PM 11/20/2023   12:50 PM 10/19/2023    1:34 PM  CMP  Glucose 70 - 99 mg/dL 782  98  82   BUN 8 - 23 mg/dL 23  23  21    Creatinine 0.61 - 1.24 mg/dL 9.56  2.13  0.86   Sodium 135 - 145 mmol/L 140  140  138   Potassium 3.5 - 5.1 mmol/L 4.1  3.9  3.8   Chloride 98 - 111 mmol/L 103  104  102   CO2 22 - 32 mmol/L 26  26  25    Calcium 8.9 - 10.3 mg/dL 9.0  9.0  9.1   Total Protein 6.5 - 8.1 g/dL 6.8  6.7  6.7   Total Bilirubin 0.0 -  1.2 mg/dL 0.8  0.7  0.8   Alkaline Phos 38 - 126 U/L 90  93  79   AST 15 - 41 U/L 24  23  20    ALT 0 - 44 U/L 20  19  17     Lab Results  Component Value Date   LDH 140 02/01/2023   LDH 160 02/07/2022   Lab Results  Component Value Date   TSH 1.007 12/18/2023   T4TOTAL 7.3 12/18/2023   STUDIES:    HISTORY:   Past Medical History:  Diagnosis Date   Arthritis    gouty arthritis June 2023   History of colon polyps    History of kidney stones    Hypercholesteremia    Hypertension    Leukocytosis 06/16/2022   Malignant melanoma (HCC)    of the left foot with in-transit metastases   Peptic ulcer    Spinal stenosis at L4-L5 level    Stroke [facility])    CVA, 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 , 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     Past Surgical History:  Procedure Laterality Date   LUMBAR  LAMINECTOMY/DECOMPRESSION MICRODISCECTOMY N/A 01/26/2022   Procedure: Microlumbar decompression Lumbar four-five, lateral mass fusion with autograft and allograft bone;  Surgeon: Orvan Blanch, MD;  Location: [facility] OR;  Service: Orthopedics;  Laterality: N/A;   PARTIAL GASTRECTOMY     TOTAL HIP ARTHROPLASTY Right 06/18/2018   Procedure: RIGHT TOTAL HIP ARTHROPLASTY ANTERIOR APPROACH;  Surgeon: Claiborne Crew, MD;  Location: [facility] ORS;  Service: Orthopedics;  Laterality: Right;    Family History  Problem Relation Age of Onset   Lung cancer Sister    Melanoma Brother     Social History:  reports that he has quit smoking. He has never used smokeless tobacco. He reports that he does not currently use alcohol. He reports that he does not use drugs.The patient is alone today.  Allergies:  Allergies  Allergen Reactions   Morphine Itching    Patient states it is tolerable itching    Current Medications: Current Outpatient Medications  Medication Sig Dispense Refill   amLODipine  (NORVASC ) 5 MG tablet Take 5 mg by mouth daily.     Calcium Carbonate (CALCIUM 600 PO) Take 1 tablet by mouth 2 (two) times daily.     clopidogrel  (PLAVIX ) 75 MG tablet Take 1 tablet by mouth daily.     docusate sodium  (COLACE) 100 MG capsule 1 capsule 2 TIMES DAILY (route: oral)     famotidine  (PEPCID ) 40 MG tablet TAKE 1 TABLET EVERY DAY 90 tablet 3   fluconazole  (DIFLUCAN ) 200 MG tablet Take 1 tablet (200 mg total) by mouth daily. 10 tablet 1   folic acid  (FOLVITE ) 400 MCG tablet Take 400 mcg by mouth daily.     gabapentin  (NEURONTIN ) 300 MG capsule Take 1 capsule (300 mg total) by mouth 3 (three) times daily. 270 capsule 3   hydrochlorothiazide  (HYDRODIURIL ) 12.5 MG tablet Take 12.5 mg by mouth daily.     lisinopril  (ZESTRIL ) 30 MG tablet Take 30 mg by mouth daily.     Multiple Vitamin (MULTIVITAMIN WITH MINERALS) TABS tablet Take 1 tablet by mouth in the morning.     mupirocin ointment (BACTROBAN) 2 % Apply topically.      neomycin -polymyxin-hydrocortisone (CORTISPORIN) OTIC solution Place 3 drops into the right ear 4 (four) times daily. 10 mL 1   nystatin  (MYCOSTATIN /NYSTOP ) powder Apply 1 Application topically 3 (three) times daily. 15 g 0   Omega-3 1000 MG  CAPS Take by mouth.     ondansetron  (ZOFRAN ) 4 MG tablet Take 1 tablet (4 mg total) by mouth every 4 (four) hours as needed for nausea. 90 tablet 3   oxyCODONE  (OXY IR/ROXICODONE ) 5 MG immediate release tablet Take 1 tablet (5 mg total) by mouth every 6 (six) hours as needed for severe pain. (Patient not taking: Reported on 07/27/2023) 30 tablet 0   polyethylene glycol (MIRALAX  / GLYCOLAX ) 17 g packet Take 17 g by mouth daily. 14 each 0   pravastatin  (PRAVACHOL ) 40 MG tablet Take 40 mg by mouth daily.     prochlorperazine  (COMPAZINE ) 10 MG tablet Take 1 tablet (10 mg total) by mouth every 6 (six) hours as needed for nausea or vomiting. 90 tablet 3   traZODone  (DESYREL ) 50 MG tablet TAKE 1 TABLET AT BEDTIME AS NEEDED FOR SLEEP 30 tablet 11   triamcinolone  cream (KENALOG ) 0.1 % Apply topically 2 (two) times daily.     No current facility-administered medications for this visit.    I,Jasmine M Lassiter,acting as a scribe for Nolia Baumgartner, MD.,have documented all relevant documentation on the behalf of Nolia Baumgartner, MD,as directed by  Nolia Baumgartner, MD while in the presence of Nolia Baumgartner, MD.

## 2023-12-18 NOTE — Patient Instructions (Signed)
 Nivolumab Injection What is this medication? NIVOLUMAB (nye VOL ue mab) treats some types of cancer. It works by helping your immune system slow or stop the spread of cancer cells. It is a monoclonal antibody. This medicine may be used for other purposes; ask your health care provider or pharmacist if you have questions. COMMON BRAND NAME(S): Opdivo What should I tell my care team before I take this medication? They need to know if you have any of these conditions: Allogeneic stem cell transplant (uses someone else's stem cells) Autoimmune diseases, such as Crohn disease, ulcerative colitis, lupus History of chest radiation Nervous system problems, such as Guillain-Barre syndrome or myasthenia gravis Organ transplant An unusual or allergic reaction to nivolumab, other medications, foods, dyes, or preservatives Pregnant or trying to get pregnant Breast-feeding How should I use this medication? This medication is infused into a vein. It is given in a hospital or clinic setting. A special MedGuide will be given to you before each treatment. Be sure to read this information carefully each time. Talk to your care team about the use of this medication in children. While it may be prescribed for children as young as 12 years for selected conditions, precautions do apply. Overdosage: If you think you have taken too much of this medicine contact a poison control center or emergency room at once. NOTE: This medicine is only for you. Do not share this medicine with others. What if I miss a dose? Keep appointments for follow-up doses. It is important not to miss your dose. Call your care team if you are unable to keep an appointment. What may interact with this medication? Interactions have not been studied. This list may not describe all possible interactions. Give your health care provider a list of all the medicines, herbs, non-prescription drugs, or dietary supplements you use. Also tell them if you  smoke, drink alcohol, or use illegal drugs. Some items may interact with your medicine. What should I watch for while using this medication? Your condition will be monitored carefully while you are receiving this medication. You may need blood work while taking this medication. This medication may cause serious skin reactions. They can happen weeks to months after starting the medication. Contact your care team right away if you notice fevers or flu-like symptoms with a rash. The rash may be red or purple and then turn into blisters or peeling of the skin. You may also notice a red rash with swelling of the face, lips, or lymph nodes in your neck or under your arms. Tell your care team right away if you have any change in your eyesight. Talk to your care team if you are pregnant or think you might be pregnant. A negative pregnancy test is required before starting this medication. A reliable form of contraception is recommended while taking this medication and for 5 months after the last dose. Talk to your care team about effective forms of contraception. Do not breast-feed while taking this medication and for 5 months after the last dose. What side effects may I notice from receiving this medication? Side effects that you should report to your care team as soon as possible: Allergic reactions--skin rash, itching, hives, swelling of the face, lips, tongue, or throat Dry cough, shortness of breath or trouble breathing Eye pain, redness, irritation, or discharge with blurry or decreased vision Heart muscle inflammation--unusual weakness or fatigue, shortness of breath, chest pain, fast or irregular heartbeat, dizziness, swelling of the ankles, feet, or hands Hormone  gland problems--headache, sensitivity to light, unusual weakness or fatigue, dizziness, fast or irregular heartbeat, increased sensitivity to cold or heat, excessive sweating, constipation, hair loss, increased thirst or amount of urine,  tremors or shaking, irritability Infusion reactions--chest pain, shortness of breath or trouble breathing, feeling faint or lightheaded Kidney injury (glomerulonephritis)--decrease in the amount of urine, red or dark brown urine, foamy or bubbly urine, swelling of the ankles, hands, or feet Liver injury--right upper belly pain, loss of appetite, nausea, light-colored stool, dark yellow or brown urine, yellowing skin or eyes, unusual weakness or fatigue Pain, tingling, or numbness in the hands or feet, muscle weakness, change in vision, confusion or trouble speaking, loss of balance or coordination, trouble walking, seizures Rash, fever, and swollen lymph nodes Redness, blistering, peeling, or loosening of the skin, including inside the mouth Sudden or severe stomach pain, bloody diarrhea, fever, nausea, vomiting Side effects that usually do not require medical attention (report these to your care team if they continue or are bothersome): Bone, joint, or muscle pain Diarrhea Fatigue Loss of appetite Nausea Skin rash This list may not describe all possible side effects. Call your doctor for medical advice about side effects. You may report side effects to FDA at 1-800-FDA-1088. Where should I keep my medication? This medication is given in a hospital or clinic. It will not be stored at home. NOTE: This sheet is a summary. It may not cover all possible information. If you have questions about this medicine, talk to your doctor, pharmacist, or health care provider.  2024 [employer] (2021-12-05 00:00:00)

## 2023-12-19 ENCOUNTER — Other Ambulatory Visit: Payer: Self-pay

## 2023-12-19 LAB — T4: T4, Total: 7.3 ug/dL (ref 4.5–12.0)

## 2023-12-24 DIAGNOSIS — C44529 Squamous cell carcinoma of skin of other part of trunk: Secondary | ICD-10-CM | POA: Diagnosis not present

## 2024-01-01 ENCOUNTER — Encounter: Payer: Self-pay | Admitting: Oncology

## 2024-01-08 ENCOUNTER — Other Ambulatory Visit: Payer: Self-pay

## 2024-01-09 ENCOUNTER — Telehealth: Payer: Self-pay

## 2024-01-09 NOTE — Telephone Encounter (Signed)
 Faxed request for [facility] to send a copy of pathology of most recent skin cancer dated around Dec 24, 2023.

## 2024-01-09 NOTE — Telephone Encounter (Signed)
-----   Message from Nolia Baumgartner sent at 01/01/2024  6:48 PM EDT ----- Regarding: path He had a new skin cancer and it was to be resected on May 5.  Please obtain the path reports when available

## 2024-01-10 ENCOUNTER — Other Ambulatory Visit: Payer: Self-pay

## 2024-01-10 DIAGNOSIS — B372 Candidiasis of skin and nail: Secondary | ICD-10-CM

## 2024-01-10 MED ORDER — FLUCONAZOLE 200 MG PO TABS: 200.0000 mg | ORAL_TABLET | Freq: Every day | ORAL | 1 refills | Status: DC

## 2024-01-15 ENCOUNTER — Ambulatory Visit

## 2024-01-15 ENCOUNTER — Other Ambulatory Visit

## 2024-01-15 ENCOUNTER — Encounter: Payer: Self-pay | Admitting: Oncology

## 2024-01-15 ENCOUNTER — Ambulatory Visit: Admitting: Oncology

## 2024-01-15 NOTE — Progress Notes (Signed)
 [facility]  [address] [place],  [place]  [address] [phone]  Clinic Day: 01/16/2024  Referring physician: Nolia Baumgartner, MD  ASSESSMENT & PLAN:  Assessment: Malignant melanoma of the left foot He had a punch biopsy and this revealed a Breslow thickness of 1.6 mm and Clark's level 4 with ulceration for a T2b N0 M0 clinically.  We found no distant metastasis on PET scan, but he did have metastasis to the left lower extremity.  His tumor is positive for BRAF and PDL1 mutation. He had this treated with wide local excision.   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   Multiple lesions of the lung These were suspicious for metastatic disease but resolved on the CT scan in December, 2023. PET scan in June was negative, but we cannot absolutely rule out metastatic disease since these lesions were too small to biopsy. We discussed the fact that the resolution of these lesions could represent a response to treatment, or may not have been malignant at all.  We think it is more likely the former, and so I recommend continuation of therapy.  We have had several discussions about this.  His latest scan from January 2025 shows resolution of a multitude of pulmonary nodules.  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 He then had this area resected and pathology revealed residual squamous cell carcinoma with clear margins and he is healing well.   Spinal stenosis at L4-5 He has had surgical decompression and resection of a synovial cyst in June 2023.   Leukocytosis He has had chronic leukocytosis and we did not find an explanation but the [facility] continue to fluctuate up and down.   Lymphedema of the Left  Lower Extremity We discussed this diagnosis and the limited approaches available. This is improved.  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 He then had this area resected and pathology revealed residual squamous cell carcinoma with clear margins and he is healing well. He denies any respiratory symptoms but he seems to have decreased breath sounds on the right, so I will order a chest x-ray for further evaluation. His day 1 cycle 25 of nivolumab  is scheduled on 01/16/2024. He has a chronic elevated WBC of 12.5, hemoglobin of 13.6, and platelet count of 237,000. His CMP is normal other than a elevated BUN of 24. His TSH and T4 levels are pending today. He will be due for repeat scans in July. I will see him back in 4 weeks with CBC, CMP, TSH, and T4. The patient understands the plans discussed today and is in agreement with them.  He knows to contact our office if he develops concerns prior to his next appointment.  I provided 11 minutes of face-to-face time during this encounter and > 50% was spent counseling as documented under my assessment and plan.   Nolia Baumgartner, MD  [facility] - A DEPT OF [facility] [address] [place]  [address] Dept: [phone] Dept Fax: [phone]  No orders of the defined types were placed in this encounter.   CHIEF COMPLAINT:  CC: Malignant melanoma   Current Treatment:  Evaluation and immunotherapy   HISTORY OF PRESENT ILLNESS:  [name] is a 78 y.o. male with a history of malignant melanoma who is referred in consultation with Dr. Trina Fujita for assessment and management.  He had a lesion of the dorsum of the left foot in September 2022 which appeared to be a granuloma and was treated in the office.  He later  developed a another lesion lateral to this on the dorsum of the left foot which was not hyperpigmented but did bleed easily.  He also has several lesions of his left medial calf which are nodular and span approximately 6 cm.  He has had some increased edema of the left lower leg.  He was evaluated by dermatology on June 5, who felt this could be a squamous cell carcinoma versus tinea or a drug reaction.  He had biopsies done of both the foot lesion and the Lesion.  Pathology came back that the left dorsal foot shave biopsy revealed a malignant melanoma possibly a primary nodular ulcerated melanoma which had a Breslow thickness of at least 1.6 mm and a Clark's level 4.  Margins are positive as this was a shave biopsy.  Ulceration is present but no satellitosis.  The mitotic index is 4/mm and no lymphovascular invasion was identified.  No brisk tumor infiltrating lymphocytes are seen and tumor regression is absent.  This is felt to be at least a T2b lesion and wide excision was recommended.  The lesion of the left medial calf revealed a dermal map melanoma most consistent with metastatic disease. BRAF and PDL1 are positive.  This information was not available when the patient was admitted for back surgery on June 8 and he had a microlumbar decompression at L4-5 for spinal stenosis.  He was also found to have a synovial cyst at that time which was resected and benign.  He was readmitted several days later for acute pain of the right elbow and both arms.  Aspiration was obtained and this was diagnosed as gouty arthritis.  He did have temporary rehab after his back surgery.  His left leg was evaluated for deep venous thrombosis and was negative.  While he was recuperating from his surgery he had an x-ray which was abnormal which led to a CT scan which revealed at least 3 lung lesions.  MRI of the brain was negative and he is now referred for management of the malignant melanoma. PET scan on 01/31/2023  revealed  interval resolution of previously demonstrated hypermetabolic activity within the left foot, no evidence of local recurrence of metastatic disease, resolution of right middle lobe airspace diseases seen on most recent CT. No suspicious pulmonary nodularity, new opacification of the maxillary sinus with mild peripheral hypermetabolic activity, likely inflammatory, and aortic atherosclerosis.  CT chest, abdomen, and pelvis done on 09/07/2023 that revealed the previously present multitude of the right middle lobe pulmonary micro nodule and nodules have essentially resolved, with a 3 mm residual nodule likely represent sequela of infectious or inflammatory process and no evidence of metastatic disease to the thorax, abdomen, or pelvis.   Oncology History  Malignant melanoma, metastatic (HCC)  01/23/2022 Cancer Staging   Staging form: Melanoma of the Skin, AJCC 8th Edition - Clinical stage from 01/23/2022: Stage III (cT2b, cN1c, cM0) - Signed by Nolia Baumgartner,  MD on 04/07/2022 Histopathologic type: Malignant melanoma, NOS (except juvenile melanoma M-8770/[id number]) Stage prefix: Initial diagnosis Laterality: Left Lymph-vascular invasion (LVI): LVI not present (absent)/not identified Diagnostic confirmation: Positive histology Specimen type: Biopsy / Limited Resection Staged by: Managing physician Mitotic count: 4 Mitotic unit: mm2 Clark's level: Level IV Tumor-infiltrating lymphocytes: Present and non-brisk Breslow depth (mm): 16 Ulceration of the epidermis: Yes Microsatellites: No Primary tumor regression: Absent Intransit/satellite metastasis: In-transit Lymph node clinically or radiologically detected: No Microscopic confirmation of metastasis: No Sentinel lymph node biopsy performed: No Completion or therapeutic lymph node dissection performed: No Matted nodes: No Stage used in treatment planning: Yes National guidelines used in treatment planning: Yes Type of national guideline used in  treatment planning: NCCN   01/31/2022 Initial Diagnosis   Malignant melanoma, metastatic (HCC)   04/14/2022 -  Chemotherapy   Patient is on Treatment Plan : MELANOMA Nivolumab  (480) q28d (changed from q14d on 12/4)     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)  02/28/2022 Cancer Staging   Staging form: Melanoma of the Skin, AJCC 8th Edition - Clinical stage from 02/28/2022: Stage IV (cT2b(m), cN1c, cM1a) - Signed by Nolia Baumgartner, MD on 04/07/2022 Histopathologic type: Malignant melanoma, NOS (except juvenile melanoma M-8770/[id number]) Stage prefix: Initial diagnosis Laterality: Left Multiple tumors: Yes Lymph-vascular invasion (LVI): LVI present/identified, NOS Diagnostic confirmation: Positive histology Specimen type: Excision Staged by: Managing physician Intransit/satellite metastasis: Both in-transit and satellite Prognostic indicators: 02/28/22:  Wide excision of primary of foot - no residual melanoma,  Excision of skin of calf consistent with intransit mets of dermal/subcuticular   Melanoma with LVI, metastatic disease with multifocal lesions and multifocal positive margins 03/24/22: Re-excision with a few atypical melanocytes, no melanoma and clear margins   04/07/2022 Initial Diagnosis   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 He also had pulmonary nodules which have resolved so we are not sure whether they are metastases or not. He is therefore on maintenance immunotherapy with nivolumab . Patient states that he feels well and has no complaints of pain. 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 He then had this area resected and pathology  revealed residual squamous cell carcinoma with clear margins and he is healing well. He denies any respiratory symptoms but he seems to have decreased breath sounds on the right, so I will order a chest x-ray for further evaluation. His day 1 cycle 25 of nivolumab  is scheduled on 01/16/2024. He has a chronic elevated WBC of 12.5, hemoglobin of 13.6, and platelet count of 237,000. His CMP is normal other than a elevated BUN of 24. His TSH and T4 levels are pending today. He will be due for repeat scans in July. I will see him back in 4 weeks with CBC, CMP, TSH, and T4.   He denies fever, chills, night sweats, or other signs of infection. He denies cardiorespiratory and gastrointestinal issues. He  denies pain. His appetite is good and His weight has decreased 1 pounds over last month.   REVIEW OF SYSTEMS:  Review of Systems  Constitutional: Negative.  Negative for appetite change, chills, diaphoresis, fatigue, fever and unexpected weight  change.  HENT:  Negative.  Negative for hearing loss, lump/mass, mouth sores, nosebleeds, sore throat, tinnitus, trouble swallowing and voice change.   Eyes: Negative.  Negative for eye problems and icterus.  Respiratory:  Negative for chest tightness, cough, hemoptysis, shortness of breath and wheezing.   Cardiovascular: Negative.  Negative for chest pain, leg swelling and palpitations.  Gastrointestinal:  Positive for diarrhea (on and off). Negative for abdominal distention, abdominal pain, blood in stool, constipation, nausea, rectal pain and vomiting.  Endocrine: Negative.  Negative for hot flashes.  Genitourinary: Negative.  Negative for bladder incontinence, difficulty urinating, dyspareunia, dysuria, frequency, hematuria, nocturia, pelvic pain and penile discharge.   Musculoskeletal: Negative.  Negative for arthralgias, back pain, flank pain, gait problem, myalgias, neck pain and neck stiffness.  Skin: Negative.  Negative for itching, rash and wound.   Neurological: Negative.  Negative for dizziness, extremity weakness, gait problem, headaches, light-headedness, numbness, seizures and speech difficulty.  Hematological: Negative.  Negative for adenopathy. Does not bruise/bleed easily.  Psychiatric/Behavioral: Negative.  Negative for confusion, decreased concentration, depression, sleep disturbance and suicidal ideas. The patient is not nervous/anxious.      VITALS:  Blood pressure (!) 145/69, pulse (!) 57, temperature 97.8 F (36.6 C), temperature source Oral, resp. rate 16, height 5' 9.39" (1.763 m), weight 231 lb 1.6 oz (104.8 kg), SpO2 97%.  Wt Readings from Last 3 Encounters:  01/16/24 231 lb 1.6 oz (104.8 kg)  12/18/23 232 lb (105.2 kg)  11/20/23 233 lb 11.2 oz (106 kg)    Body mass index is 33.74 kg/m.  Performance status (ECOG): 1 - Symptomatic but completely ambulatory  PHYSICAL EXAM:  Physical Exam Vitals and nursing note reviewed.  Constitutional:      General: He is not in acute distress.    Appearance: Normal appearance. He is normal weight. He is not ill-appearing, toxic-appearing or diaphoretic.  HENT:     Head: Normocephalic and atraumatic.     Right Ear: Tympanic membrane, ear canal and external ear normal. There is no impacted cerumen.     Left Ear: Tympanic membrane, ear canal and external ear normal. There is no impacted cerumen.     Nose: Nose normal. No congestion or rhinorrhea.     Mouth/Throat:     Mouth: Mucous membranes are moist.     Pharynx: Oropharynx is clear. No oropharyngeal exudate or posterior oropharyngeal erythema.  Eyes:     General: No scleral icterus.       Right eye: No discharge.        Left eye: No discharge.     Extraocular Movements: Extraocular movements intact.     Conjunctiva/sclera: Conjunctivae normal.     Pupils: Pupils are equal, round, and reactive to light.  Cardiovascular:     Rate and Rhythm: Normal rate and regular rhythm.     Pulses: Normal pulses.     Heart sounds:  Normal heart sounds. No murmur heard.    No friction rub. No gallop.  Pulmonary:     Effort: Pulmonary effort is normal. No respiratory distress.     Breath sounds: No stridor. No wheezing, rhonchi or rales.  Chest:     Chest wall: No tenderness.     Comments: Healing incision in the upper sternal area Abdominal:     General: Bowel sounds are normal. There is no distension.     Palpations: Abdomen is soft. There is no hepatomegaly, splenomegaly or mass.     Tenderness: There is no abdominal tenderness. There  is no right CVA tenderness, left CVA tenderness, guarding or rebound.     Hernia: No hernia is present.  Musculoskeletal:        General: Normal range of motion.     Cervical back: Normal range of motion and neck supple. No tenderness.     Right lower leg: No edema.     Left lower leg: Edema (mild) present.  Lymphadenopathy:     Cervical: No cervical adenopathy.     Upper Body:     Right upper body: No supraclavicular or axillary adenopathy.     Left upper body: No supraclavicular or axillary adenopathy.     Lower Body: No right inguinal adenopathy. No left inguinal adenopathy.  Skin:    General: Skin is warm and dry.     Coloration: Skin is not jaundiced.     Findings: No rash. Scaling rash: Scattered benign appearing nevi.     Comments: Large healing scar of the left medial knee without evidence of recurrence. Scattered benign appearing nevi. Vitiligo of the skin  Neurological:     General: No focal deficit present.     Mental Status: He is alert and oriented to person, place, and time. Mental status is at baseline.     Cranial Nerves: No cranial nerve deficit.     Sensory: No sensory deficit.     Motor: No weakness.     Coordination: Coordination normal.     Gait: Gait normal.     Deep Tendon Reflexes: Reflexes normal.  Psychiatric:        Mood and Affect: Mood normal.        Behavior: Behavior normal.        Thought Content: Thought content normal.        Judgment:  Judgment normal.    LABS:      Latest Ref Rng & Units 01/16/2024    1:30 PM 12/18/2023   12:57 PM 11/20/2023   12:50 PM  CBC  WBC 4.0 - 10.5 K/uL 12.5  11.7  11.8   Hemoglobin 13.0 - 17.0 g/dL 16.1  09.6  04.5   Hematocrit 39.0 - 52.0 % 40.6  41.8  42.4   Platelets 150 - 400 K/uL 237  226  224       Latest Ref Rng & Units 01/16/2024    1:30 PM 12/18/2023   12:57 PM 11/20/2023   12:50 PM  CMP  Glucose 70 - 99 mg/dL 96  409  98   BUN 8 - 23 mg/dL 24  23  23    Creatinine 0.61 - 1.24 mg/dL 8.11  9.14  7.82   Sodium 135 - 145 mmol/L 139  140  140   Potassium 3.5 - 5.1 mmol/L 4.2  4.1  3.9   Chloride 98 - 111 mmol/L 103  103  104   CO2 22 - 32 mmol/L 25  26  26    Calcium 8.9 - 10.3 mg/dL 9.8  9.0  9.0   Total Protein 6.5 - 8.1 g/dL 6.9  6.8  6.7   Total Bilirubin 0.0 - 1.2 mg/dL 0.6  0.8  0.7   Alkaline Phos 38 - 126 U/L 95  90  93   AST 15 - 41 U/L 21  24  23    ALT 0 - 44 U/L 18  20  19     Lab Results  Component Value Date   LDH 140 02/01/2023   LDH 160 02/07/2022   Lab Results  Component Value Date  TSH 0.996 01/16/2024   T4TOTAL 6.8 01/16/2024   STUDIES:  Pathology: 12/24/2023 Diagnosis: Residual Squamous Cell Carcinoma Clear margins  Pathology: 11/29/2023 Diagnosis: Squamous Cell Carcinoma     HISTORY:   Past Medical History:  Diagnosis Date   Arthritis    gouty arthritis June 2023   History of colon polyps    History of kidney stones    Hypercholesteremia    Hypertension    Leukocytosis 06/16/2022   Malignant melanoma (HCC)    of the left foot with in-transit metastases   Peptic ulcer    Spinal stenosis at L4-L5 level    Stroke [facility])    CVA, 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 , 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     Past Surgical History:  Procedure Laterality Date   LUMBAR LAMINECTOMY/DECOMPRESSION MICRODISCECTOMY N/A 01/26/2022   Procedure: Microlumbar decompression Lumbar four-five, lateral mass fusion with autograft and allograft bone;   Surgeon: Orvan Blanch, MD;  Location: [facility] OR;  Service: Orthopedics;  Laterality: N/A;   PARTIAL GASTRECTOMY     TOTAL HIP ARTHROPLASTY Right 06/18/2018   Procedure: RIGHT TOTAL HIP ARTHROPLASTY ANTERIOR APPROACH;  Surgeon: Claiborne Crew, MD;  Location: [facility] ORS;  Service: Orthopedics;  Laterality: Right;    Family History  Problem Relation Age of Onset   Lung cancer Sister    Melanoma Brother     Social History:  reports that he has quit smoking. He has never used smokeless tobacco. He reports that he does not currently use alcohol. He reports that he does not use drugs.The patient is alone today.  Allergies:  Allergies  Allergen Reactions   Morphine Itching    Patient states it is tolerable itching    Current Medications: Current Outpatient Medications  Medication Sig Dispense Refill   amLODipine  (NORVASC ) 5 MG tablet Take 5 mg by mouth daily.     Calcium Carbonate (CALCIUM 600 PO) Take 1 tablet by mouth 2 (two) times daily.     clopidogrel  (PLAVIX ) 75 MG tablet Take 1 tablet by mouth daily.     docusate sodium  (COLACE) 100 MG capsule 1 capsule 2 TIMES DAILY (route: oral)     famotidine  (PEPCID ) 40 MG tablet TAKE 1 TABLET EVERY DAY 90 tablet 3   fluconazole  (DIFLUCAN ) 200 MG tablet Take 1 tablet (200 mg total) by mouth daily. 10 tablet 1   folic acid  (FOLVITE ) 400 MCG tablet Take 400 mcg by mouth daily.     gabapentin  (NEURONTIN ) 300 MG capsule Take 1 capsule (300 mg total) by mouth 3 (three) times daily. 270 capsule 3   hydrochlorothiazide  (HYDRODIURIL ) 12.5 MG tablet Take 12.5 mg by mouth daily.     lisinopril  (ZESTRIL ) 30 MG tablet Take 30 mg by mouth daily.     Multiple Vitamin (MULTIVITAMIN WITH MINERALS) TABS tablet Take 1 tablet by mouth in the morning.     mupirocin ointment (BACTROBAN) 2 % Apply topically.     neomycin -polymyxin-hydrocortisone (CORTISPORIN) OTIC solution Place 3 drops into the right ear 4 (four) times daily. 10 mL 1   nystatin  (MYCOSTATIN /NYSTOP ) powder  Apply 1 Application topically 3 (three) times daily. 15 g 0   Omega-3 1000 MG CAPS Take by mouth.     ondansetron  (ZOFRAN ) 4 MG tablet Take 1 tablet (4 mg total) by mouth every 4 (four) hours as needed for nausea. 90 tablet 3   oxyCODONE  (OXY IR/ROXICODONE ) 5 MG immediate release tablet Take 1 tablet (5 mg total) by mouth every 6 (six) hours as  needed for severe pain. (Patient not taking: Reported on 07/27/2023) 30 tablet 0   polyethylene glycol (MIRALAX  / GLYCOLAX ) 17 g packet Take 17 g by mouth daily. 14 each 0   pravastatin  (PRAVACHOL ) 40 MG tablet Take 40 mg by mouth daily.     prochlorperazine  (COMPAZINE ) 10 MG tablet Take 1 tablet (10 mg total) by mouth every 6 (six) hours as needed for nausea or vomiting. 90 tablet 3   traZODone  (DESYREL ) 50 MG tablet TAKE 1 TABLET AT BEDTIME AS NEEDED FOR SLEEP 30 tablet 11   triamcinolone  cream (KENALOG ) 0.1 % Apply topically 2 (two) times daily.     No current facility-administered medications for this visit.    I,Jasmine M Lassiter,acting as a scribe for Nolia Baumgartner, MD.,have documented all relevant documentation on the behalf of Nolia Baumgartner, MD,as directed by  Nolia Baumgartner, MD while in the presence of Nolia Baumgartner, MD.

## 2024-01-16 ENCOUNTER — Ambulatory Visit (HOSPITAL_BASED_OUTPATIENT_CLINIC_OR_DEPARTMENT_OTHER)
Admission: RE | Admit: 2024-01-16 | Discharge: 2024-01-16 | Disposition: A | Discharge status: Home / Self Care | Source: Ambulatory Visit | Attending: Oncology | Admitting: Oncology

## 2024-01-16 ENCOUNTER — Other Ambulatory Visit: Payer: Self-pay | Admitting: Oncology

## 2024-01-16 ENCOUNTER — Inpatient Hospital Stay

## 2024-01-16 ENCOUNTER — Inpatient Hospital Stay (HOSPITAL_BASED_OUTPATIENT_CLINIC_OR_DEPARTMENT_OTHER): Admitting: Oncology

## 2024-01-16 ENCOUNTER — Inpatient Hospital Stay: Attending: Oncology

## 2024-01-16 ENCOUNTER — Encounter: Payer: Self-pay | Admitting: Oncology

## 2024-01-16 ENCOUNTER — Telehealth: Payer: Self-pay | Admitting: Oncology

## 2024-01-16 VITALS — BP 145/69 | HR 57 | Temp 97.8°F | Resp 16 | Ht 69.39 in | Wt 231.1 lb

## 2024-01-16 DIAGNOSIS — C792 Secondary malignant neoplasm of skin: Secondary | ICD-10-CM

## 2024-01-16 DIAGNOSIS — Z5112 Encounter for antineoplastic immunotherapy: Secondary | ICD-10-CM | POA: Diagnosis not present

## 2024-01-16 DIAGNOSIS — Z79620 Long term (current) use of immunosuppressive biologic: Secondary | ICD-10-CM | POA: Diagnosis not present

## 2024-01-16 DIAGNOSIS — C4372 Malignant melanoma of left lower limb, including hip: Secondary | ICD-10-CM | POA: Insufficient documentation

## 2024-01-16 DIAGNOSIS — C439 Malignant melanoma of skin, unspecified: Secondary | ICD-10-CM

## 2024-01-16 DIAGNOSIS — Z452 Encounter for adjustment and management of vascular access device: Secondary | ICD-10-CM | POA: Diagnosis not present

## 2024-01-16 DIAGNOSIS — R069 Unspecified abnormalities of breathing: Secondary | ICD-10-CM | POA: Diagnosis not present

## 2024-01-16 DIAGNOSIS — R918 Other nonspecific abnormal finding of lung field: Secondary | ICD-10-CM

## 2024-01-16 LAB — TSH: TSH: 0.996 u[IU]/mL (ref 0.350–4.500)

## 2024-01-16 LAB — CBC WITH DIFFERENTIAL (CANCER CENTER ONLY)
Abs Immature Granulocytes: 0.04 10*3/uL (ref 0.00–0.07)
Basophils Absolute: 0 10*3/uL (ref 0.0–0.1)
Basophils Relative: 0 %
Eosinophils Absolute: 0.2 10*3/uL (ref 0.0–0.5)
Eosinophils Relative: 2 %
HCT: 40.6 % (ref 39.0–52.0)
Hemoglobin: 13.6 g/dL (ref 13.0–17.0)
Immature Granulocytes: 0 %
Lymphocytes Relative: 20 %
Lymphs Abs: 2.4 10*3/uL (ref 0.7–4.0)
MCH: 29.2 pg (ref 26.0–34.0)
MCHC: 33.5 g/dL (ref 30.0–36.0)
MCV: 87.3 fL (ref 80.0–100.0)
Monocytes Absolute: 1 10*3/uL (ref 0.1–1.0)
Monocytes Relative: 8 %
Neutro Abs: 8.8 10*3/uL — ABNORMAL HIGH (ref 1.7–7.7)
Neutrophils Relative %: 70 %
Platelet Count: 237 10*3/uL (ref 150–400)
RBC: 4.65 MIL/uL (ref 4.22–5.81)
RDW: 13.3 % (ref 11.5–15.5)
WBC Count: 12.5 10*3/uL — ABNORMAL HIGH (ref 4.0–10.5)
nRBC: 0 % (ref 0.0–0.2)

## 2024-01-16 LAB — CMP (CANCER CENTER ONLY)
ALT: 18 U/L (ref 0–44)
AST: 21 U/L (ref 15–41)
Albumin: 4.5 g/dL (ref 3.5–5.0)
Alkaline Phosphatase: 95 U/L (ref 38–126)
Anion gap: 11 (ref 5–15)
BUN: 24 mg/dL — ABNORMAL HIGH (ref 8–23)
CO2: 25 mmol/L (ref 22–32)
Calcium: 9.8 mg/dL (ref 8.9–10.3)
Chloride: 103 mmol/L (ref 98–111)
Creatinine: 1.23 mg/dL (ref 0.61–1.24)
GFR, Estimated: >60 mL/min (ref >60)
Glucose, Bld: 96 mg/dL (ref 70–99)
Potassium: 4.2 mmol/L (ref 3.5–5.1)
Sodium: 139 mmol/L (ref 135–145)
Total Bilirubin: 0.6 mg/dL (ref 0.0–1.2)
Total Protein: 6.9 g/dL (ref 6.5–8.1)

## 2024-01-16 MED ORDER — SODIUM CHLORIDE 0.9 % IV SOLN: Freq: Once | INTRAVENOUS | Status: AC

## 2024-01-16 MED ORDER — SODIUM CHLORIDE 0.9% FLUSH
10.0000 mL | INTRAVENOUS | Status: DC | PRN
  Administered 2024-01-16: 10 mL

## 2024-01-16 MED ORDER — SODIUM CHLORIDE 0.9 % IV SOLN
480.0000 mg | Freq: Once | INTRAVENOUS | Status: AC
  Administered 2024-01-16: 480 mg via INTRAVENOUS
  Filled 2024-01-16: qty 48

## 2024-01-16 MED ORDER — HEPARIN SOD (PORK) LOCK FLUSH 100 UNIT/ML IV SOLN
500.0000 [IU] | Freq: Once | INTRAVENOUS | Status: AC | PRN
  Administered 2024-01-16: 500 [IU]

## 2024-01-16 NOTE — Telephone Encounter (Signed)
 Patient has been scheduled for follow-up visit per 01/16/24 LOS.  Pt given an appt calendar with date and time.

## 2024-01-16 NOTE — Patient Instructions (Signed)
 Nivolumab Injection What is this medication? NIVOLUMAB (nye VOL ue mab) treats some types of cancer. It works by helping your immune system slow or stop the spread of cancer cells. It is a monoclonal antibody. This medicine may be used for other purposes; ask your health care provider or pharmacist if you have questions. COMMON BRAND NAME(S): Opdivo What should I tell my care team before I take this medication? They need to know if you have any of these conditions: Allogeneic stem cell transplant (uses someone else's stem cells) Autoimmune diseases, such as Crohn disease, ulcerative colitis, lupus History of chest radiation Nervous system problems, such as Guillain-Barre syndrome or myasthenia gravis Organ transplant An unusual or allergic reaction to nivolumab, other medications, foods, dyes, or preservatives Pregnant or trying to get pregnant Breast-feeding How should I use this medication? This medication is infused into a vein. It is given in a hospital or clinic setting. A special MedGuide will be given to you before each treatment. Be sure to read this information carefully each time. Talk to your care team about the use of this medication in children. While it may be prescribed for children as young as 12 years for selected conditions, precautions do apply. Overdosage: If you think you have taken too much of this medicine contact a poison control center or emergency room at once. NOTE: This medicine is only for you. Do not share this medicine with others. What if I miss a dose? Keep appointments for follow-up doses. It is important not to miss your dose. Call your care team if you are unable to keep an appointment. What may interact with this medication? Interactions have not been studied. This list may not describe all possible interactions. Give your health care provider a list of all the medicines, herbs, non-prescription drugs, or dietary supplements you use. Also tell them if you  smoke, drink alcohol, or use illegal drugs. Some items may interact with your medicine. What should I watch for while using this medication? Your condition will be monitored carefully while you are receiving this medication. You may need blood work while taking this medication. This medication may cause serious skin reactions. They can happen weeks to months after starting the medication. Contact your care team right away if you notice fevers or flu-like symptoms with a rash. The rash may be red or purple and then turn into blisters or peeling of the skin. You may also notice a red rash with swelling of the face, lips, or lymph nodes in your neck or under your arms. Tell your care team right away if you have any change in your eyesight. Talk to your care team if you are pregnant or think you might be pregnant. A negative pregnancy test is required before starting this medication. A reliable form of contraception is recommended while taking this medication and for 5 months after the last dose. Talk to your care team about effective forms of contraception. Do not breast-feed while taking this medication and for 5 months after the last dose. What side effects may I notice from receiving this medication? Side effects that you should report to your care team as soon as possible: Allergic reactions--skin rash, itching, hives, swelling of the face, lips, tongue, or throat Dry cough, shortness of breath or trouble breathing Eye pain, redness, irritation, or discharge with blurry or decreased vision Heart muscle inflammation--unusual weakness or fatigue, shortness of breath, chest pain, fast or irregular heartbeat, dizziness, swelling of the ankles, feet, or hands Hormone  gland problems--headache, sensitivity to light, unusual weakness or fatigue, dizziness, fast or irregular heartbeat, increased sensitivity to cold or heat, excessive sweating, constipation, hair loss, increased thirst or amount of urine,  tremors or shaking, irritability Infusion reactions--chest pain, shortness of breath or trouble breathing, feeling faint or lightheaded Kidney injury (glomerulonephritis)--decrease in the amount of urine, red or dark brown urine, foamy or bubbly urine, swelling of the ankles, hands, or feet Liver injury--right upper belly pain, loss of appetite, nausea, light-colored stool, dark yellow or brown urine, yellowing skin or eyes, unusual weakness or fatigue Pain, tingling, or numbness in the hands or feet, muscle weakness, change in vision, confusion or trouble speaking, loss of balance or coordination, trouble walking, seizures Rash, fever, and swollen lymph nodes Redness, blistering, peeling, or loosening of the skin, including inside the mouth Sudden or severe stomach pain, bloody diarrhea, fever, nausea, vomiting Side effects that usually do not require medical attention (report these to your care team if they continue or are bothersome): Bone, joint, or muscle pain Diarrhea Fatigue Loss of appetite Nausea Skin rash This list may not describe all possible side effects. Call your doctor for medical advice about side effects. You may report side effects to FDA at 1-800-FDA-1088. Where should I keep my medication? This medication is given in a hospital or clinic. It will not be stored at home. NOTE: This sheet is a summary. It may not cover all possible information. If you have questions about this medicine, talk to your doctor, pharmacist, or health care provider.  2024 [employer] (2021-12-05 00:00:00)

## 2024-01-17 ENCOUNTER — Other Ambulatory Visit: Payer: Self-pay

## 2024-01-17 LAB — T4: T4, Total: 6.8 ug/dL (ref 4.5–12.0)

## 2024-01-18 ENCOUNTER — Telehealth: Payer: Self-pay

## 2024-01-18 NOTE — Telephone Encounter (Signed)
 Called patient to let him know that his chest xray looked good per Dr. Almer Jacobson. Official report not back yet but Dr. Almer Jacobson looked at films.

## 2024-01-22 ENCOUNTER — Encounter: Payer: Self-pay | Admitting: Oncology

## 2024-02-08 ENCOUNTER — Other Ambulatory Visit: Payer: Self-pay

## 2024-02-13 ENCOUNTER — Other Ambulatory Visit: Payer: Self-pay | Admitting: Oncology

## 2024-02-13 ENCOUNTER — Inpatient Hospital Stay: Attending: Oncology

## 2024-02-13 ENCOUNTER — Inpatient Hospital Stay (HOSPITAL_BASED_OUTPATIENT_CLINIC_OR_DEPARTMENT_OTHER): Admitting: Oncology

## 2024-02-13 ENCOUNTER — Encounter: Payer: Self-pay | Admitting: Oncology

## 2024-02-13 ENCOUNTER — Inpatient Hospital Stay

## 2024-02-13 ENCOUNTER — Telehealth: Payer: Self-pay | Admitting: Oncology

## 2024-02-13 VITALS — BP 143/73 | HR 80 | Temp 98.1°F | Resp 18 | Ht 69.39 in | Wt 230.1 lb

## 2024-02-13 DIAGNOSIS — C439 Malignant melanoma of skin, unspecified: Secondary | ICD-10-CM

## 2024-02-13 DIAGNOSIS — C792 Secondary malignant neoplasm of skin: Secondary | ICD-10-CM

## 2024-02-13 DIAGNOSIS — Z5112 Encounter for antineoplastic immunotherapy: Secondary | ICD-10-CM | POA: Diagnosis not present

## 2024-02-13 DIAGNOSIS — Z79620 Long term (current) use of immunosuppressive biologic: Secondary | ICD-10-CM | POA: Insufficient documentation

## 2024-02-13 DIAGNOSIS — C4372 Malignant melanoma of left lower limb, including hip: Secondary | ICD-10-CM | POA: Diagnosis not present

## 2024-02-13 LAB — CMP (CANCER CENTER ONLY)
ALT: 16 U/L (ref 0–44)
AST: 18 U/L (ref 15–41)
Albumin: 4.2 g/dL (ref 3.5–5.0)
Alkaline Phosphatase: 87 U/L (ref 38–126)
Anion gap: 13 (ref 5–15)
BUN: 25 mg/dL — ABNORMAL HIGH (ref 8–23)
CO2: 23 mmol/L (ref 22–32)
Calcium: 9.1 mg/dL (ref 8.9–10.3)
Chloride: 100 mmol/L (ref 98–111)
Creatinine: 1.26 mg/dL — ABNORMAL HIGH (ref 0.61–1.24)
GFR, Estimated: 59 mL/min — ABNORMAL LOW (ref >60)
Glucose, Bld: 122 mg/dL — ABNORMAL HIGH (ref 70–99)
Potassium: 3.8 mmol/L (ref 3.5–5.1)
Sodium: 136 mmol/L (ref 135–145)
Total Bilirubin: 1.1 mg/dL (ref 0.0–1.2)
Total Protein: 6.8 g/dL (ref 6.5–8.1)

## 2024-02-13 LAB — CBC WITH DIFFERENTIAL (CANCER CENTER ONLY)
Abs Immature Granulocytes: 0.09 10*3/uL — ABNORMAL HIGH (ref 0.00–0.07)
Basophils Absolute: 0 10*3/uL (ref 0.0–0.1)
Basophils Relative: 0 %
Eosinophils Absolute: 0.3 10*3/uL (ref 0.0–0.5)
Eosinophils Relative: 1 %
HCT: 38.8 % — ABNORMAL LOW (ref 39.0–52.0)
Hemoglobin: 12.9 g/dL — ABNORMAL LOW (ref 13.0–17.0)
Immature Granulocytes: 0 %
Lymphocytes Relative: 13 %
Lymphs Abs: 2.7 10*3/uL (ref 0.7–4.0)
MCH: 29.1 pg (ref 26.0–34.0)
MCHC: 33.2 g/dL (ref 30.0–36.0)
MCV: 87.6 fL (ref 80.0–100.0)
Monocytes Absolute: 1.5 10*3/uL — ABNORMAL HIGH (ref 0.1–1.0)
Monocytes Relative: 7 %
Neutro Abs: 15.8 10*3/uL — ABNORMAL HIGH (ref 1.7–7.7)
Neutrophils Relative %: 79 %
Platelet Count: 230 10*3/uL (ref 150–400)
RBC: 4.43 MIL/uL (ref 4.22–5.81)
RDW: 13.8 % (ref 11.5–15.5)
WBC Count: 20.4 10*3/uL — ABNORMAL HIGH (ref 4.0–10.5)
nRBC: 0 % (ref 0.0–0.2)

## 2024-02-13 LAB — TSH: TSH: 1.467 u[IU]/mL (ref 0.350–4.500)

## 2024-02-13 MED ORDER — SODIUM CHLORIDE 0.9% FLUSH
10.0000 mL | INTRAVENOUS | Status: DC | PRN
  Administered 2024-02-13: 10 mL

## 2024-02-13 MED ORDER — HEPARIN SOD (PORK) LOCK FLUSH 100 UNIT/ML IV SOLN
500.0000 [IU] | Freq: Once | INTRAVENOUS | Status: AC | PRN
  Administered 2024-02-13: 500 [IU]

## 2024-02-13 MED ORDER — SODIUM CHLORIDE 0.9 % IV SOLN: Freq: Once | INTRAVENOUS | Status: AC

## 2024-02-13 MED ORDER — ALTEPLASE 2 MG IJ SOLR
2.0000 mg | Freq: Once | INTRAMUSCULAR | Status: AC | PRN
  Administered 2024-02-13: 2 mg
  Filled 2024-02-13: qty 2

## 2024-02-13 MED ORDER — SODIUM CHLORIDE 0.9 % IV SOLN
480.0000 mg | Freq: Once | INTRAVENOUS | Status: AC
  Administered 2024-02-13: 480 mg via INTRAVENOUS
  Filled 2024-02-13: qty 48

## 2024-02-13 NOTE — Patient Instructions (Signed)
 Nivolumab ; Relatlimab Injection What is this medication? NIVOLUMAB ; RELATLIMAB (nye VOL ue mab; rel AT li mab) treats skin cancer. It works by helping your immune system slow or stop the spread of cancer cells. It is a monoclonal antibody. This medicine may be used for other purposes; ask your health care provider or pharmacist if you have questions. COMMON BRAND NAME(S): Opdualag What should I tell my care team before I take this medication? They need to know if you have any of these conditions: Allogeneic stem cell transplant (uses someone else's stem cells) Autoimmune conditions, such as Crohn disease, ulcerative colitis, lupus History of chest radiation Nervous system conditions, such as Guillain-Barre syndrome or myasthenia gravis Organ transplant An unusual or allergic reaction to nivolumab , relatlimab, other medications, foods, dyes, or preservatives Pregnant or trying to get pregnant Breastfeeding How should I use this medication? This medication is infused into a vein. It is given in a hospital or clinic setting. A special MedGuide will be given to you before each treatment. Be sure to read this information carefully each time. Talk to your care team about the use of this medication in children. While it may be prescribed for children as young as 12 years for selected conditions, precautions do apply. Overdosage: If you think you have taken too much of this medicine contact a poison control center or emergency room at once. NOTE: This medicine is only for you. Do not share this medicine with others. What if I miss a dose? Keep appointments for follow-up doses. It is important not to miss your dose. Call your care team if you are unable to keep an appointment. What may interact with this medication? Interactions have not been studied. This list may not describe all possible interactions. Give your health care provider a list of all the medicines, herbs, non-prescription drugs, or  dietary supplements you use. Also tell them if you smoke, drink alcohol, or use illegal drugs. Some items may interact with your medicine. What should I watch for while using this medication? Your condition will be monitored carefully while you are receiving this medication. You may need blood work done while you are taking this medication. This medication may cause serious skin reactions. They can happen weeks to months after starting the medication. Contact your care team right away if you notice fevers or flu-like symptoms with a rash. The rash may be red or purple and then turn into blisters or peeling of the skin. You may also notice a red rash with swelling of the face, lips, or lymph nodes in your neck or under your arms. Tell your care team right away if you have any change in your eyesight. Talk to your care team if you may be pregnant. Serious birth defects can occur if you take this medication during pregnancy and for 5 months after the last dose. You will need a negative pregnancy test before starting this medication. Contraception is recommended while taking this medication and for 5 months after the last dose. Your care team can help you find the option that works for you. Do not breastfeed while taking this medication and for 5 months after the last dose. What side effects may I notice from receiving this medication? Side effects that you should report to your care team as soon as possible: Allergic reactions--skin rash, itching, hives, swelling of the face, lips, tongue, or throat Dry cough, shortness of breath or trouble breathing Eye pain, redness, irritation, or discharge with blurry or decreased vision Heart  muscle inflammation--unusual weakness or fatigue, shortness of breath, chest pain, fast or irregular heartbeat, dizziness, swelling of the ankles, feet, or hands Hormone gland problems--headache, sensitivity to light, unusual weakness or fatigue, dizziness, fast or irregular  heartbeat, increased sensitivity to cold or heat, excessive sweating, constipation, hair loss, increased thirst or amount of urine, tremors or shaking, irritability Infusion reactions--chest pain, shortness of breath or trouble breathing, feeling faint or lightheaded Kidney injury (glomerulonephritis)--decrease in the amount of urine, red or dark brown urine, foamy or bubbly urine, swelling of the ankles, hands, or feet Liver injury--right upper belly pain, loss of appetite, nausea, light-colored stool, dark yellow or brown urine, yellowing skin or eyes, unusual weakness or fatigue Pain, tingling, or numbness in the hands or feet, muscle weakness, change in vision, confusion or trouble speaking, loss of balance or coordination, trouble walking, seizures Rash, fever, and swollen lymph nodes Redness, blistering, peeling, or loosening of the skin, including inside the mouth Sudden or severe stomach pain, bloody diarrhea, fever, nausea, vomiting Side effects that usually do not require medical attention (report these to your care team if they continue or are bothersome): Bone, joint, or muscle pain Diarrhea Fatigue Loss of appetite Nausea Skin rash This list may not describe all possible side effects. Call your doctor for medical advice about side effects. You may report side effects to FDA at 1-800-FDA-1088. Where should I keep my medication? This medication is given in a hospital or clinic. It will not be stored at home. NOTE: This sheet is a summary. It may not cover all possible information. If you have questions about this medicine, talk to your doctor, pharmacist, or health care provider.  2024 [employer] (2022-09-25 00:00:00)

## 2024-02-13 NOTE — Progress Notes (Signed)
 1310 ALTEPLASE 2MG  INSTILLED IN PORT. NO BLOOD RETURNED FROM PORT BUT PORT FLUSHED WELL. NURSE WILL RECHECK AFTER 30 MINUTES FOR BLOOD RETURNED.

## 2024-02-13 NOTE — Progress Notes (Signed)
 [facility]  [address] [place],  [place]  [address] [phone]  Clinic Day: 02/13/24  Referring physician: Fernand Money, MD  ASSESSMENT & PLAN:  Assessment: Malignant melanoma of the left foot He had a punch biopsy and this revealed a Breslow thickness of 1.6 mm and Clark's level 4 with ulceration for a T2b N0 M0 clinically.  We found no distant metastasis on PET scan, but he did have metastasis to the left lower extremity.  His tumor is positive for BRAF and PDL1 mutation. He had this treated with wide local excision.   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 He is now on maintenance Nivolumab .  Multiple lesions of the lung These were suspicious for metastatic disease but resolved on the CT scan in December, 2023. PET scan in June was negative, but we cannot absolutely rule out metastatic disease since these lesions were too small to biopsy. We discussed the fact that the resolution of these lesions could represent a response to treatment, or may not have been malignant at all.  We think it is more likely the former, and so I recommend continuation of therapy.  We have had several discussions about this. His latest scan from January 2025 shows resolution of a multitude of pulmonary nodules. We will continue the maintenance nivolumab .   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 He then had this area resected and pathology revealed residual squamous cell carcinoma with clear margins and he is healing well.   Spinal stenosis at L4-5 He has had surgical decompression and resection of a synovial cyst in June 2023.   Leukocytosis He has had chronic leukocytosis and we did not find an explanation but  the [facility] continue to fluctuate up and down. His WBC is higher than usual but he describes an incident this week of chills, nausea, and vomiting which has now resolved. We will just monitor this.   Lymphedema of the Left Lower Extremity We discussed this diagnosis and the limited approaches available. This is improved.  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    Plan: His last scans were done in January and he will be due for repeat in July. His day 1 cycle 26 of nivolumab  is scheduled on 02/13/2024. He has an elevated WBC of 20.4, slightly low hemoglobin of 12.9 down from 13.6, and platelet count of 230,000. He did inform me that for one night he had fevers, chills, nausea, and vomiting that lasted for a day only. His CMP is normal other than an elevated creatinine of 1.26 with a BUN of 25. I encouraged him to increase his fluid intake especially considering the increased heat outside. I will also give him 1L of normal saline during his infusion today. His TSH and T4 levels are pending. I will see him back in 4 weeks with CBC, CMP, TSH, T4, and CT chest, abdomen, and pelvis. The patient understands the plans discussed today and is in agreement with them.  He knows to contact our office if he develops concerns prior to his next appointment.  I provided 16 minutes of face-to-face time during this encounter and > 50% was  spent counseling as documented under my assessment and plan.   [name], MD  [facility] - A DEPT OF [facility] [address] [place] [address] Dept: [phone] Dept Fax: [phone]  No orders of the defined types were placed in this encounter.   CHIEF COMPLAINT:  CC: Malignant melanoma   Current Treatment:  Evaluation and immunotherapy   HISTORY OF PRESENT ILLNESS:  [name] is a 78 y.o. male with a history of malignant melanoma who  is referred in consultation with Dr. Tracey Bathe for assessment and management.  He had a lesion of the dorsum of the left foot in September 2022 which appeared to be a granuloma and was treated in the office.  He later developed a another lesion lateral to this on the dorsum of the left foot which was not hyperpigmented but did bleed easily.  He also has several lesions of his left medial calf which are nodular and span approximately 6 cm.  He has had some increased edema of the left lower leg.  He was evaluated by dermatology on June 5, who felt this could be a squamous cell carcinoma versus tinea or a drug reaction.  He had biopsies done of both the foot lesion and the Lesion.  Pathology came back that the left dorsal foot shave biopsy revealed a malignant melanoma possibly a primary nodular ulcerated melanoma which had a Breslow thickness of at least 1.6 mm and a Clark's level 4.  Margins are positive as this was a shave biopsy.  Ulceration is present but no satellitosis.  The mitotic index is 4/mm and no lymphovascular invasion was identified.  No brisk tumor infiltrating lymphocytes are seen and tumor regression is absent.  This is felt to be at least a T2b lesion and wide excision was recommended.  The lesion of the left medial calf revealed a dermal map melanoma most consistent with metastatic disease. BRAF and PDL1 are positive.  This information was not available when the patient was admitted for back surgery on June 8 and he had a microlumbar decompression at L4-5 for spinal stenosis.  He was also found to have a synovial cyst at that time which was resected and benign.  He was readmitted several days later for acute pain of the right elbow and both arms.  Aspiration was obtained and this was diagnosed as gouty arthritis.  He did have temporary rehab after his back surgery.  His left leg was evaluated for deep venous thrombosis and was negative.  While he was recuperating from his surgery he had an  x-ray which was abnormal which led to a CT scan which revealed at least 3 lung lesions.  MRI of the brain was negative and he is now referred for management of the malignant melanoma. PET scan on 01/31/2023  revealed interval resolution of previously demonstrated hypermetabolic activity within the left foot, no evidence of local recurrence of metastatic disease, resolution of right middle lobe airspace diseases seen on most recent CT. No suspicious pulmonary nodularity, new opacification of the maxillary sinus with mild peripheral hypermetabolic activity, likely inflammatory, and aortic atherosclerosis.  CT chest, abdomen, and pelvis done on 09/07/2023 that revealed the previously present multitude of the right middle lobe pulmonary micro nodule and nodules have essentially resolved, with a 3 mm residual nodule likely represent sequela of infectious or inflammatory process and no evidence of metastatic disease to the thorax, abdomen, or pelvis.  Oncology History  Malignant melanoma, metastatic (HCC)  01/23/2022 Cancer Staging   Staging form: Melanoma of the Skin, AJCC 8th Edition - Clinical stage from 01/23/2022: Stage III (cT2b, cN1c, cM0) - Signed by Cornelius Wanda DEL, MD on 04/07/2022 Histopathologic type: Malignant melanoma, NOS (except juvenile melanoma M-8770/[id number]) Stage prefix: Initial diagnosis Laterality: Left Lymph-vascular invasion (LVI): LVI not present (absent)/not identified Diagnostic confirmation: Positive histology Specimen type: Biopsy / Limited Resection Staged by: Managing physician Mitotic count: 4 Mitotic unit: mm2 Clark's level: Level IV Tumor-infiltrating lymphocytes: Present and non-brisk Breslow depth (mm): 16 Ulceration of the epidermis: Yes Microsatellites: No Primary tumor regression: Absent Intransit/satellite metastasis: In-transit Lymph node clinically or radiologically detected: No Microscopic confirmation of metastasis: No Sentinel lymph node biopsy performed:  No Completion or therapeutic lymph node dissection performed: No Matted nodes: No Stage used in treatment planning: Yes National guidelines used in treatment planning: Yes Type of national guideline used in treatment planning: NCCN   01/31/2022 Initial Diagnosis   Malignant melanoma, metastatic (HCC)   04/14/2022 -  Chemotherapy   Patient is on Treatment Plan : MELANOMA Nivolumab  (480) q28d (changed from q14d on 12/4)     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)  02/28/2022 Cancer Staging   Staging form: Melanoma of the Skin, AJCC 8th Edition - Clinical stage from 02/28/2022: Stage IV (cT2b(m), cN1c, cM1a) - Signed by Cornelius Wanda DEL, MD on 04/07/2022 Histopathologic type: Malignant melanoma, NOS (except juvenile melanoma M-8770/[id number]) Stage prefix: Initial diagnosis Laterality: Left Multiple tumors: Yes Lymph-vascular invasion (LVI): LVI present/identified, NOS Diagnostic confirmation: Positive histology Specimen type: Excision Staged by: Managing physician Intransit/satellite metastasis: Both in-transit and satellite Prognostic indicators: 02/28/22:  Wide excision of primary of foot - no residual melanoma,  Excision of skin of calf consistent with intransit mets of dermal/subcuticular   Melanoma with LVI, metastatic disease with multifocal lesions and multifocal positive margins 03/24/22: Re-excision with a few atypical melanocytes, no melanoma and clear margins   04/07/2022 Initial Diagnosis   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 He also had pulmonary nodules which have resolved so we are not sure whether they are metastases or not. He is therefore on maintenance immunotherapy with nivolumab .  Patient states that he feels well but complains of occasional left shoulder pain rating 5/10. His last scans were done in January and he will be due for repeat in July. His day 1 cycle 26 of nivolumab  is scheduled on 02/13/2024. He has an elevated WBC of 20.4, slightly low hemoglobin of 12.9 down from 13.6, and platelet count of 230,000. He did inform me that for one night he had chills, nausea, and vomiting that lasted for a day only. His CMP is normal other than an elevated creatinine of 1.26 with a BUN of 25. I encouraged him to increase his fluid intake especially considering the increased heat outside. I will also give him 1L of normal saline during his infusion today. His TSH and T4 levels are pending. I will see him back in 4 weeks with CBC, CMP, TSH, T4, and CT chest, abdomen, and pelvis. He denies cardiorespiratory and gastrointestinal issues. He  denies pain. His appetite is good and His weight has decreased 1  pounds over last 4 weeks.   REVIEW OF SYSTEMS:  Review of Systems  Constitutional: Negative.  Negative for appetite change, chills, diaphoresis, fatigue, fever and unexpected weight change.  HENT:  Negative.  Negative for hearing loss, lump/mass, mouth sores, nosebleeds, sore throat, tinnitus, trouble swallowing and voice change.   Eyes: Negative.  Negative for eye problems and icterus.  Respiratory:  Negative for chest tightness, cough, hemoptysis, shortness of breath and wheezing.   Cardiovascular: Negative.  Negative for chest pain, leg swelling and palpitations.  Gastrointestinal:  Positive for diarrhea (on and off). Negative for abdominal distention, abdominal pain, blood in stool, constipation, nausea, rectal pain and vomiting.  Endocrine: Negative.  Negative for hot flashes.  Genitourinary: Negative.  Negative for bladder incontinence, difficulty urinating, dyspareunia, dysuria, frequency, hematuria, nocturia, pelvic pain and penile discharge.   Musculoskeletal: Negative.  Negative  for arthralgias, back pain, flank pain, gait problem, myalgias, neck pain and neck stiffness.       Occasional left shoulder pain rating 5/10  Skin: Negative.  Negative for itching, rash and wound.  Neurological: Negative.  Negative for dizziness, extremity weakness, gait problem, headaches, light-headedness, numbness, seizures and speech difficulty.  Hematological: Negative.  Negative for adenopathy. Does not bruise/bleed easily.  Psychiatric/Behavioral: Negative.  Negative for confusion, decreased concentration, depression, sleep disturbance and suicidal ideas. The patient is not nervous/anxious.      VITALS:  Blood pressure (!) 143/73, pulse 80, temperature 98.1 F (36.7 C), temperature source Oral, resp. rate 18, height 5' 9.39 (1.763 m), weight 230 lb 1.6 oz (104.4 kg), SpO2 95%.  Wt Readings from Last 3 Encounters:  02/13/24 230 lb 1.6 oz (104.4 kg)  01/16/24 231 lb 1.6 oz (104.8 kg)  12/18/23 232 lb (105.2 kg)    Body mass index is 33.6 kg/m.  Performance status (ECOG): 1 - Symptomatic but completely ambulatory  PHYSICAL EXAM:  Physical Exam Vitals and nursing note reviewed.  Constitutional:      General: He is not in acute distress.    Appearance: Normal appearance. He is normal weight. He is not ill-appearing, toxic-appearing or diaphoretic.  HENT:     Head: Normocephalic and atraumatic.     Right Ear: Tympanic membrane, ear canal and external ear normal. There is no impacted cerumen.     Left Ear: Tympanic membrane, ear canal and external ear normal. There is no impacted cerumen.     Nose: Nose normal. No congestion or rhinorrhea.     Mouth/Throat:     Mouth: Mucous membranes are moist.     Pharynx: Oropharynx is clear. No oropharyngeal exudate or posterior oropharyngeal erythema.  Eyes:     General: No scleral icterus.       Right eye: No discharge.        Left eye: No discharge.     Extraocular Movements: Extraocular movements intact.     Conjunctiva/sclera:  Conjunctivae normal.     Pupils: Pupils are equal, round, and reactive to light.  Cardiovascular:     Rate and Rhythm: Normal rate and regular rhythm.     Pulses: Normal pulses.     Heart sounds: Normal heart sounds. No murmur heard.    No friction rub. No gallop.  Pulmonary:     Effort: Pulmonary effort is normal. No respiratory distress.     Breath sounds: No stridor. No wheezing, rhonchi or rales.  Chest:     Chest wall: No tenderness.     Comments: Healing incision in the upper sternal area  Abdominal:     General: Bowel sounds are normal. There is no distension.     Palpations: Abdomen is soft. There is no hepatomegaly, splenomegaly or mass.     Tenderness: There is no abdominal tenderness. There is no right CVA tenderness, left CVA tenderness, guarding or rebound.     Hernia: No hernia is present.  Musculoskeletal:        General: Normal range of motion.     Cervical back: Normal range of motion and neck supple. No tenderness.     Right lower leg: No edema.     Left lower leg: Edema (mild) present.     Comments: Good ROM of the leftshoulder  Lymphadenopathy:     Cervical: No cervical adenopathy.     Upper Body:     Right upper body: No supraclavicular or axillary adenopathy.     Left upper body: No supraclavicular or axillary adenopathy.     Lower Body: No right inguinal adenopathy. No left inguinal adenopathy.  Skin:    General: Skin is warm and dry.     Coloration: Skin is not jaundiced.     Findings: No rash. Scaling rash: Scattered benign appearing nevi.     Comments: Large healing scar of the left medial knee without evidence of recurrence. Scattered benign appearing nevi. Vitiligo of the skin of the left lower extremity   Neurological:     General: No focal deficit present.     Mental Status: He is alert and oriented to person, place, and time. Mental status is at baseline.     Cranial Nerves: No cranial nerve deficit.     Sensory: No sensory deficit.     Motor: No  weakness.     Coordination: Coordination normal.     Gait: Gait normal.     Deep Tendon Reflexes: Reflexes normal.  Psychiatric:        Mood and Affect: Mood normal.        Behavior: Behavior normal.        Thought Content: Thought content normal.        Judgment: Judgment normal.    LABS:      Latest Ref Rng & Units 02/13/2024   12:56 PM 01/16/2024    1:30 PM 12/18/2023   12:57 PM  CBC  WBC 4.0 - 10.5 K/uL 20.4  12.5  11.7   Hemoglobin 13.0 - 17.0 g/dL 87.0  86.3  85.8   Hematocrit 39.0 - 52.0 % 38.8  40.6  41.8   Platelets 150 - 400 K/uL 230  237  226       Latest Ref Rng & Units 02/13/2024   12:56 PM 01/16/2024    1:30 PM 12/18/2023   12:57 PM  CMP  Glucose 70 - 99 mg/dL 877  96  863   BUN 8 - 23 mg/dL 25  24  23    Creatinine 0.61 - 1.24 mg/dL 8.73  8.76  8.81   Sodium 135 - 145 mmol/L 136  139  140   Potassium 3.5 - 5.1 mmol/L 3.8  4.2  4.1   Chloride 98 - 111 mmol/L 100  103  103   CO2 22 - 32 mmol/L 23  25  26    Calcium 8.9 - 10.3 mg/dL 9.1  9.8  9.0   Total Protein 6.5 - 8.1 g/dL 6.8  6.9  6.8   Total Bilirubin 0.0 - 1.2 mg/dL 1.1  0.6  0.8   Alkaline Phos 38 - 126 U/L  87  95  90   AST 15 - 41 U/L 18  21  24    ALT 0 - 44 U/L 16  18  20     Lab Results  Component Value Date   LDH 140 02/01/2023   LDH 160 02/07/2022   Lab Results  Component Value Date   TSH 1.467 02/13/2024   T4TOTAL 7.6 02/13/2024   STUDIES:  Pathology: 12/24/2023 Diagnosis: Residual Squamous Cell Carcinoma Clear margins   EXAM: 01/16/2024 CHEST - 2 VIEW IMPRESSION: No active cardiopulmonary disease.  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 , 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     Past Surgical History:  Procedure Laterality Date   LUMBAR LAMINECTOMY/DECOMPRESSION MICRODISCECTOMY N/A 01/26/2022   Procedure: Microlumbar decompression Lumbar four-five, lateral mass fusion with autograft and allograft bone;  Surgeon: Duwayne Purchase, MD;  Location: [facility] OR;  Service: Orthopedics;  Laterality: N/A;   PARTIAL GASTRECTOMY     TOTAL HIP ARTHROPLASTY Right 06/18/2018   Procedure: RIGHT TOTAL HIP ARTHROPLASTY ANTERIOR APPROACH;  Surgeon: Ernie Cough, MD;  Location: [facility] ORS;  Service: Orthopedics;  Laterality: Right;    Family History  Problem Relation Age of Onset   Lung cancer Sister    Melanoma Brother     Social History:  reports that he has quit smoking. He has never used smokeless tobacco. He reports that he does not currently use alcohol. He reports that he does not use drugs.The patient is alone today.  Allergies:  Allergies  Allergen Reactions   Morphine Itching    Patient states it is tolerable itching    Current Medications: Current Outpatient Medications  Medication Sig Dispense Refill   amLODipine  (NORVASC ) 5 MG tablet Take 5 mg by mouth daily.     Calcium Carbonate (CALCIUM 600 PO) Take 1 tablet by mouth 2 (two) times daily.     clopidogrel  (PLAVIX ) 75 MG tablet Take 1 tablet by mouth daily.     docusate sodium  (COLACE) 100 MG capsule 1 capsule 2 TIMES DAILY (route: oral)     famotidine  (PEPCID ) 40 MG tablet TAKE 1 TABLET EVERY DAY 90 tablet 3   fluconazole  (DIFLUCAN ) 200 MG tablet Take 1 tablet (200 mg total) by mouth daily. 10 tablet 1   folic acid  (FOLVITE ) 400 MCG tablet Take 400 mcg by mouth daily.     gabapentin  (NEURONTIN ) 300 MG capsule Take 1 capsule (300 mg total) by mouth 3 (three) times daily. 270 capsule 3   hydrochlorothiazide  (HYDRODIURIL ) 12.5 MG tablet  Take 12.5 mg by mouth daily.     lisinopril  (ZESTRIL ) 30 MG tablet Take 30 mg by mouth daily.     Multiple Vitamin (MULTIVITAMIN WITH MINERALS) TABS tablet Take 1 tablet by mouth in the morning.     mupirocin ointment (BACTROBAN) 2 % Apply topically.     neomycin -polymyxin-hydrocortisone (CORTISPORIN) OTIC solution Place 3 drops into the  right ear 4 (four) times daily. 10 mL 1   nystatin  (MYCOSTATIN /NYSTOP ) powder Apply 1 Application topically 3 (three) times daily. 15 g 0   Omega-3 1000 MG CAPS Take by mouth.     ondansetron  (ZOFRAN ) 4 MG tablet Take 1 tablet (4 mg total) by mouth every 4 (four) hours as needed for nausea. 90 tablet 3   oxyCODONE  (OXY IR/ROXICODONE ) 5 MG immediate release tablet Take 1 tablet (5 mg total) by mouth every 6 (six) hours as needed for severe pain. (Patient not taking: Reported on 07/27/2023) 30 tablet 0   polyethylene glycol (MIRALAX  / GLYCOLAX ) 17 g packet Take 17 g by mouth daily. 14 each 0   pravastatin  (PRAVACHOL ) 40 MG tablet Take 40 mg by mouth daily.     prochlorperazine  (COMPAZINE ) 10 MG tablet Take 1 tablet (10 mg total) by mouth every 6 (six) hours as needed for nausea or vomiting. 90 tablet 3   traZODone  (DESYREL ) 50 MG tablet TAKE 1 TABLET AT BEDTIME AS NEEDED FOR SLEEP 30 tablet 11   triamcinolone  cream (KENALOG ) 0.1 % Apply topically 2 (two) times daily.     No current facility-administered medications for this visit.    I,Jasmine M Lassiter,acting as a scribe for [name], MD.,have documented all relevant documentation on the behalf of [name], MD,as directed by  [name], MD while in the presence of [name], MD.

## 2024-02-13 NOTE — Telephone Encounter (Signed)
 Patient has been scheduled for follow-up visit per 02/13/24 LOS.  Pt given an appt calendar with date and time.

## 2024-02-14 ENCOUNTER — Other Ambulatory Visit: Payer: Self-pay

## 2024-02-14 LAB — T4: T4, Total: 7.6 ug/dL (ref 4.5–12.0)

## 2024-02-24 ENCOUNTER — Encounter: Payer: Self-pay | Admitting: Oncology

## 2024-03-02 ENCOUNTER — Other Ambulatory Visit: Payer: Self-pay

## 2024-03-06 ENCOUNTER — Ambulatory Visit (HOSPITAL_BASED_OUTPATIENT_CLINIC_OR_DEPARTMENT_OTHER)
Admission: RE | Admit: 2024-03-06 | Discharge: 2024-03-06 | Disposition: A | Discharge status: Home / Self Care | Source: Ambulatory Visit | Attending: Oncology | Admitting: Oncology

## 2024-03-06 DIAGNOSIS — N281 Cyst of kidney, acquired: Secondary | ICD-10-CM | POA: Diagnosis not present

## 2024-03-06 DIAGNOSIS — C439 Malignant melanoma of skin, unspecified: Secondary | ICD-10-CM | POA: Diagnosis not present

## 2024-03-06 DIAGNOSIS — K8689 Other specified diseases of pancreas: Secondary | ICD-10-CM | POA: Diagnosis not present

## 2024-03-06 DIAGNOSIS — C792 Secondary malignant neoplasm of skin: Secondary | ICD-10-CM | POA: Diagnosis not present

## 2024-03-06 MED ORDER — IOHEXOL 300 MG/ML  SOLN
100.0000 mL | Freq: Once | INTRAMUSCULAR | Status: AC | PRN
Start: 2024-03-06 — End: 2024-03-06
  Administered 2024-03-06: 100 mL via INTRAVENOUS

## 2024-03-11 NOTE — Progress Notes (Signed)
 [facility]  [address] [place],  [place]  [address] [phone]  Clinic Day: 03/12/24  Referring physician: Fernand Money, MD  ASSESSMENT & PLAN:  Assessment: Malignant melanoma of the left foot He had a punch biopsy and this revealed a Breslow thickness of 1.6 mm and Clark's level 4 with ulceration for a T2b N0 M0 clinically.  We found no distant metastasis on PET scan, but he did have metastasis to the left lower extremity.  His tumor is positive for BRAF and PDL1 mutation. He had this treated with wide local excision.   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 He is now on maintenance Nivolumab .  Multiple lesions of the lung These were suspicious for metastatic disease but resolved on the CT scan in December, 2023. PET scan in June was negative, but we cannot absolutely rule out metastatic disease since these lesions were too small to biopsy. We discussed the fact that the resolution of these lesions could represent a response to treatment, or may not have been malignant at all.  We think it is more likely the former, and so I recommend continuation of therapy.  We have had several discussions about this. His latest scan from January 2025 shows resolution of a multitude of pulmonary nodules. We will continue the maintenance nivolumab .   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 He then had this area resected and pathology revealed residual squamous cell carcinoma with clear margins and he is healing well.   Spinal stenosis at L4-5 He has had surgical decompression and resection of a synovial cyst in June 2023.   Leukocytosis He has had chronic leukocytosis and we did not find an  explanation but the [facility] continue to fluctuate up and down. His WBC is normal for the first time in years. We will just monitor this.   Lymphedema of the Left Lower Extremity We discussed this diagnosis and the limited approaches available. This is improved.  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    Plan: He had a CT chest, abdomen, and pelvis done on 03/06/2024 which revealed no significant interval change, no developing new mass lesion, fluid collection or lymph node enlargement, and a few tiny lung nodules are essentially stable with no increasing or dominant lung nodule. His day 1 cycle 27 of nivolumab  is scheduled on 03/12/2024. He has a WBC of 10.5 improved from 20.4, hemoglobin of 13.9, and platelet count of 220,000. His CMP is normal other than a elevated BUN of 27. His TSH and T4 today are pending.  I will see him back in 4 weeks with CBC, CMP, TSH, and T4. The patient understands the plans discussed today and is in agreement with them.  He knows to contact our office if he develops concerns prior to his next appointment.  I provided 14 minutes of face-to-face time during this encounter and > 50% was spent counseling as documented under my assessment and plan.   [name], MD  [facility] - A DEPT OF [facility]  [facility] [address] [place] [address] Dept: [phone] Dept  Fax: [phone]  No orders of the defined types were placed in this encounter.   CHIEF COMPLAINT:  CC: Malignant melanoma   Current Treatment:  Evaluation and immunotherapy   HISTORY OF PRESENT ILLNESS:  [name] is a 78 y.o. male with a history of malignant melanoma who is referred in consultation with Dr. Tracey Bathe for assessment and management.  He had a lesion of the dorsum of the left foot in September 2022 which appeared to be a granuloma and was treated in the office.  He  later developed a another lesion lateral to this on the dorsum of the left foot which was not hyperpigmented but did bleed easily.  He also has several lesions of his left medial calf which are nodular and span approximately 6 cm.  He has had some increased edema of the left lower leg.  He was evaluated by dermatology on June 5, who felt this could be a squamous cell carcinoma versus tinea or a drug reaction.  He had biopsies done of both the foot lesion and the Lesion.  Pathology came back that the left dorsal foot shave biopsy revealed a malignant melanoma possibly a primary nodular ulcerated melanoma which had a Breslow thickness of at least 1.6 mm and a Clark's level 4.  Margins are positive as this was a shave biopsy.  Ulceration is present but no satellitosis.  The mitotic index is 4/mm and no lymphovascular invasion was identified.  No brisk tumor infiltrating lymphocytes are seen and tumor regression is absent.  This is felt to be at least a T2b lesion and wide excision was recommended.  The lesion of the left medial calf revealed a dermal map melanoma most consistent with metastatic disease. BRAF and PDL1 are positive.  This information was not available when the patient was admitted for back surgery on June 8 and he had a microlumbar decompression at L4-5 for spinal stenosis.  He was also found to have a synovial cyst at that time which was resected and benign.  He was readmitted several days later for acute pain of the right elbow and both arms.  Aspiration was obtained and this was diagnosed as gouty arthritis.  He did have temporary rehab after his back surgery.  His left leg was evaluated for deep venous thrombosis and was negative.  While he was recuperating from his surgery he had an x-ray which was abnormal which led to a CT scan which revealed at least 3 lung lesions.  MRI of the brain was negative and he is now referred for management of the malignant melanoma. PET scan on 01/31/2023  revealed  interval resolution of previously demonstrated hypermetabolic activity within the left foot, no evidence of local recurrence of metastatic disease, resolution of right middle lobe airspace diseases seen on most recent CT. No suspicious pulmonary nodularity, new opacification of the maxillary sinus with mild peripheral hypermetabolic activity, likely inflammatory, and aortic atherosclerosis.  CT chest, abdomen, and pelvis done on 09/07/2023 that revealed the previously present multitude of the right middle lobe pulmonary micro nodule and nodules have essentially resolved, with a 3 mm residual nodule likely represent sequela of infectious or inflammatory process and no evidence of metastatic disease to the thorax, abdomen, or pelvis.   Oncology History  Malignant melanoma, metastatic (HCC)  01/23/2022 Cancer Staging   Staging form: Melanoma of the Skin, AJCC 8th Edition - Clinical stage from 01/23/2022: Stage III (cT2b, cN1c, cM0) - Signed by Cornelius Wanda DEL, MD on 04/07/2022 Histopathologic  type: Malignant melanoma, NOS (except juvenile melanoma M-8770/[id number]) Stage prefix: Initial diagnosis Laterality: Left Lymph-vascular invasion (LVI): LVI not present (absent)/not identified Diagnostic confirmation: Positive histology Specimen type: Biopsy / Limited Resection Staged by: Managing physician Mitotic count: 4 Mitotic unit: mm2 Clark's level: Level IV Tumor-infiltrating lymphocytes: Present and non-brisk Breslow depth (mm): 16 Ulceration of the epidermis: Yes Microsatellites: No Primary tumor regression: Absent Intransit/satellite metastasis: In-transit Lymph node clinically or radiologically detected: No Microscopic confirmation of metastasis: No Sentinel lymph node biopsy performed: No Completion or therapeutic lymph node dissection performed: No Matted nodes: No Stage used in treatment planning: Yes National guidelines used in treatment planning: Yes Type of national guideline used in  treatment planning: NCCN   01/31/2022 Initial Diagnosis   Malignant melanoma, metastatic (HCC)   04/14/2022 -  Chemotherapy   Patient is on Treatment Plan : MELANOMA Nivolumab  (480) q28d (changed from q14d on 12/4)     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)  02/28/2022 Cancer Staging   Staging form: Melanoma of the Skin, AJCC 8th Edition - Clinical stage from 02/28/2022: Stage IV (cT2b(m), cN1c, cM1a) - Signed by Cornelius Wanda DEL, MD on 04/07/2022 Histopathologic type: Malignant melanoma, NOS (except juvenile melanoma M-8770/[id number]) Stage prefix: Initial diagnosis Laterality: Left Multiple tumors: Yes Lymph-vascular invasion (LVI): LVI present/identified, NOS Diagnostic confirmation: Positive histology Specimen type: Excision Staged by: Managing physician Intransit/satellite metastasis: Both in-transit and satellite Prognostic indicators: 02/28/22:  Wide excision of primary of foot - no residual melanoma,  Excision of skin of calf consistent with intransit mets of dermal/subcuticular   Melanoma with LVI, metastatic disease with multifocal lesions and multifocal positive margins 03/24/22: Re-excision with a few atypical melanocytes, no melanoma and clear margins   04/07/2022 Initial Diagnosis   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 He also had pulmonary nodules which have resolved so we are not sure whether they are metastases or not. He is therefore on maintenance immunotherapy with nivolumab . Patient states that he feels well but complains of chronic intermittent back pain. He had a CT chest, abdomen, and pelvis done on 03/06/2024 which revealed no significant interval change, no developing new mass lesion,  fluid collection or lymph node enlargement, and a few tiny lung nodules are essentially stable with no increasing or dominant lung nodule. His day 1 cycle 27 of nivolumab  is scheduled on 03/12/2024. He has a WBC of 10.5 improved from 20.4, hemoglobin of 13.9, and platelet count of 220,000. His CMP is normal other than a elevated BUN of 27. His TSH and T4 today are pending.  I will see him back in 4 weeks with CBC, CMP, TSH, and T4. He denies fever, chills, night sweats, or other signs of infection. He denies cardiorespiratory and gastrointestinal issues. He  denies pain. His appetite is good and His weight has decreased 1 pounds over last 4 weeks.   REVIEW OF SYSTEMS:  Review of Systems  Constitutional: Negative.  Negative for appetite change, chills, diaphoresis, fatigue, fever and unexpected weight change.  HENT:  Negative.  Negative for hearing loss, lump/mass, mouth sores, nosebleeds, sore throat, tinnitus, trouble swallowing and voice change.   Eyes: Negative.  Negative for eye problems and icterus.  Respiratory:  Negative for chest tightness, cough, hemoptysis, shortness of breath and wheezing.   Cardiovascular: Negative.  Negative for chest pain, leg swelling and palpitations.  Gastrointestinal:  Positive for diarrhea (on and off). Negative for abdominal distention, abdominal pain, blood in stool, constipation, nausea, rectal pain and vomiting.  Endocrine: Negative.  Negative for hot flashes.  Genitourinary: Negative.  Negative for bladder incontinence, difficulty urinating, dyspareunia, dysuria, frequency, hematuria, nocturia, pelvic pain and penile discharge.   Musculoskeletal:  Positive for back pain (chronic, intermittent). Negative for arthralgias, flank pain, gait problem, myalgias, neck pain and neck stiffness.       Occasional left shoulder pain   Skin: Negative.  Negative for itching, rash and wound.  Neurological: Negative.  Negative for dizziness, extremity weakness, gait problem,  headaches, light-headedness, numbness, seizures and speech difficulty.  Hematological: Negative.  Negative for adenopathy. Does not bruise/bleed easily.  Psychiatric/Behavioral: Negative.  Negative for confusion, decreased concentration, depression, sleep disturbance and suicidal ideas. The patient is not nervous/anxious.      VITALS:  Blood pressure (!) 140/77, pulse 68, temperature 98.2 F (36.8 C), temperature source Oral, resp. rate 16, height 5' 9.39 (1.763 m), weight 229 lb 6.4 oz (104.1 kg), SpO2 97%.  Wt Readings from Last 3 Encounters:  03/12/24 229 lb 6.4 oz (104.1 kg)  02/13/24 230 lb 1.6 oz (104.4 kg)  01/16/24 231 lb 1.6 oz (104.8 kg)    Body mass index is 33.5 kg/m.  Performance status (ECOG): 1 - Symptomatic but completely ambulatory  PHYSICAL EXAM:  Physical Exam Vitals and nursing note reviewed.  Constitutional:      General: He is not in acute distress.    Appearance: Normal appearance. He is normal weight. He is not ill-appearing, toxic-appearing or diaphoretic.  HENT:     Head: Normocephalic and atraumatic.     Right Ear: Tympanic membrane, ear canal and external ear normal. There is no impacted cerumen.     Left Ear: Tympanic membrane, ear canal and external ear normal. There is no impacted cerumen.     Nose: Nose normal. No congestion or rhinorrhea.     Mouth/Throat:     Mouth: Mucous membranes are moist.     Pharynx: Oropharynx is clear. No oropharyngeal exudate or posterior oropharyngeal erythema.  Eyes:     General: No scleral icterus.       Right eye: No discharge.        Left eye: No discharge.     Extraocular Movements: Extraocular movements intact.     Conjunctiva/sclera: Conjunctivae normal.     Pupils: Pupils are equal, round, and reactive to light.  Cardiovascular:     Rate and Rhythm: Normal rate and regular rhythm.     Pulses: Normal pulses.     Heart sounds: Normal heart sounds. No murmur heard.    No friction rub. No gallop.  Pulmonary:      Effort: Pulmonary effort is normal. No respiratory distress.     Breath sounds: No stridor. No wheezing, rhonchi or rales.  Chest:     Chest wall: No tenderness.     Comments: Healing incision in the upper sternal area Abdominal:     General: Bowel sounds are normal. There is no distension.     Palpations: Abdomen is soft. There is no hepatomegaly, splenomegaly or mass.     Tenderness: There is no abdominal tenderness. There is no right CVA tenderness, left CVA tenderness, guarding or rebound.     Hernia: No hernia is present.  Musculoskeletal:        General: Normal range of motion.     Cervical back: Normal range of motion and neck supple. No tenderness.     Right lower leg: No edema.     Left lower leg: No edema.  Lymphadenopathy:     Cervical: No cervical adenopathy.     Upper Body:     Right upper body: No supraclavicular or axillary adenopathy.     Left upper body: No supraclavicular or axillary adenopathy.     Lower Body: No right inguinal adenopathy. No left inguinal adenopathy.  Skin:    General: Skin is warm and dry.     Coloration: Skin is not jaundiced.     Findings: No rash. Scaling rash: Scattered benign appearing nevi.     Comments: Large healing scar of the left medial knee without evidence of recurrence. Scattered benign appearing nevi. Vitiligo of the skin of the left lower extremity   Neurological:     General: No focal deficit present.     Mental Status: He is alert and oriented to person, place, and time. Mental status is at baseline.     Cranial Nerves: No cranial nerve deficit.     Sensory: No sensory deficit.     Motor: No weakness.     Coordination: Coordination normal.     Gait: Gait normal.     Deep Tendon Reflexes: Reflexes normal.  Psychiatric:        Mood and Affect: Mood normal.        Behavior: Behavior normal.        Thought Content: Thought content normal.        Judgment: Judgment normal.    LABS:      Latest Ref Rng & Units  03/12/2024   12:56 PM 02/13/2024   12:56 PM 01/16/2024    1:30 PM  CBC  WBC 4.0 - 10.5 K/uL 10.5  20.4  12.5   Hemoglobin 13.0 - 17.0 g/dL 86.0  87.0  86.3   Hematocrit 39.0 - 52.0 % 41.6  38.8  40.6   Platelets 150 - 400 K/uL 220  230  237       Latest Ref Rng & Units 03/12/2024   12:56 PM 02/13/2024   12:56 PM 01/16/2024    1:30 PM  CMP  Glucose 70 - 99 mg/dL 99  877  96   BUN 8 - 23 mg/dL 27  25  24    Creatinine 0.61 - 1.24 mg/dL 8.77  8.73  8.76   Sodium 135 - 145 mmol/L 140  136  139   Potassium 3.5 - 5.1 mmol/L 4.0  3.8  4.2   Chloride 98 - 111 mmol/L 104  100  103   CO2 22 - 32 mmol/L 24  23  25    Calcium 8.9 - 10.3 mg/dL 9.3  9.1  9.8   Total Protein 6.5 - 8.1 g/dL 7.1  6.8  6.9   Total Bilirubin 0.0 - 1.2 mg/dL 0.7  1.1  0.6   Alkaline Phos 38 - 126 U/L 88  87  95   AST 15 - 41 U/L 22  18  21    ALT 0 - 44 U/L 23  16  18     Lab Results  Component Value Date   LDH 140 02/01/2023   LDH 160 02/07/2022   Lab Results  Component Value Date   TSH 1.617 03/12/2024   T4TOTAL 8.0 03/12/2024   STUDIES:  EXAM: 03/06/2024  CT CHEST, ABDOMEN, AND PELVIS WITH CONTRAST IMPRESSION: Overall no significant interval change. No developing new mass lesion, fluid collection or lymph node enlargement. Few tiny lung nodules are essentially stable when adjusted for technique. No increasing or dominant lung nodule. No bowel obstruction, free air or free fluid.  Scattered stool. Bilateral Bosniak 1 renal cysts.  Pathology: 12/24/2023 Diagnosis: Residual Squamous Cell Carcinoma Clear margins   EXAM: 01/16/2024 CHEST - 2 VIEW IMPRESSION: No active cardiopulmonary disease.  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 , 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     Past Surgical History:  Procedure Laterality Date   LUMBAR LAMINECTOMY/DECOMPRESSION MICRODISCECTOMY N/A 01/26/2022   Procedure: Microlumbar decompression Lumbar four-five, lateral mass fusion with autograft and allograft bone;  Surgeon: Duwayne Purchase, MD;  Location: [facility] OR;  Service: Orthopedics;  Laterality: N/A;   PARTIAL GASTRECTOMY     TOTAL HIP ARTHROPLASTY Right 06/18/2018   Procedure: RIGHT TOTAL HIP ARTHROPLASTY ANTERIOR APPROACH;  Surgeon: Ernie Cough, MD;  Location: [facility] ORS;  Service: Orthopedics;  Laterality: Right;    Family History  Problem Relation Age of Onset   Lung cancer Sister    Melanoma Brother     Social History:  reports that he has quit smoking. He has never used smokeless tobacco. He reports that he does not currently use alcohol. He reports that he does not use drugs.The patient is alone today.  Allergies:  Allergies  Allergen Reactions   Morphine Itching    Patient states it is tolerable itching    Current Medications: Current Outpatient Medications  Medication Sig Dispense Refill   amLODipine  (NORVASC ) 5 MG tablet Take 5 mg by mouth daily.     Calcium Carbonate (CALCIUM 600 PO) Take 1 tablet by mouth 2 (two) times daily.     clopidogrel  (PLAVIX ) 75 MG tablet Take 1 tablet by mouth daily.     docusate sodium  (COLACE) 100 MG capsule 1 capsule 2 TIMES DAILY (route: oral)     famotidine  (PEPCID ) 40 MG tablet TAKE 1 TABLET EVERY DAY 90 tablet 3   fluconazole  (DIFLUCAN ) 200 MG tablet Take 1 tablet (200 mg total) by mouth daily. 10 tablet 1   folic acid  (FOLVITE ) 400 MCG tablet Take 400 mcg by mouth daily.     gabapentin  (NEURONTIN ) 300 MG capsule Take 1 capsule (300 mg total) by mouth 3 (three) times  daily. 270 capsule 3   hydrochlorothiazide  (HYDRODIURIL ) 12.5 MG tablet Take 12.5 mg by mouth daily.     lisinopril  (ZESTRIL ) 30 MG tablet Take 30 mg by mouth daily.     Multiple Vitamin (MULTIVITAMIN WITH MINERALS) TABS tablet Take 1 tablet by mouth in the morning.     mupirocin ointment (BACTROBAN) 2 % Apply topically.     neomycin -polymyxin-hydrocortisone (CORTISPORIN) OTIC solution Place 3 drops into the right ear 4 (four) times daily. 10 mL 1   nystatin  (  MYCOSTATIN /NYSTOP ) powder Apply 1 Application topically 3 (three) times daily. 15 g 0   Omega-3 1000 MG CAPS Take by mouth.     ondansetron  (ZOFRAN ) 4 MG tablet Take 1 tablet (4 mg total) by mouth every 4 (four) hours as needed for nausea. 90 tablet 3   oxyCODONE  (OXY IR/ROXICODONE ) 5 MG immediate release tablet Take 1 tablet (5 mg total) by mouth every 6 (six) hours as needed for severe pain. (Patient not taking: Reported on 07/27/2023) 30 tablet 0   polyethylene glycol (MIRALAX  / GLYCOLAX ) 17 g packet Take 17 g by mouth daily. 14 each 0   pravastatin  (PRAVACHOL ) 40 MG tablet Take 40 mg by mouth daily.     prochlorperazine  (COMPAZINE ) 10 MG tablet Take 1 tablet (10 mg total) by mouth every 6 (six) hours as needed for nausea or vomiting. 90 tablet 3   traZODone  (DESYREL ) 50 MG tablet TAKE 1 TABLET AT BEDTIME AS NEEDED FOR SLEEP 30 tablet 11   triamcinolone  cream (KENALOG ) 0.1 % Apply topically 2 (two) times daily.     No current facility-administered medications for this visit.    I,Jasmine M Lassiter,acting as a scribe for [name], MD.,have documented all relevant documentation on the behalf of [name], MD,as directed by  [name], MD while in the presence of [name], MD.

## 2024-03-12 ENCOUNTER — Other Ambulatory Visit: Payer: Self-pay | Admitting: Oncology

## 2024-03-12 ENCOUNTER — Encounter: Payer: Self-pay | Admitting: Oncology

## 2024-03-12 ENCOUNTER — Inpatient Hospital Stay (HOSPITAL_BASED_OUTPATIENT_CLINIC_OR_DEPARTMENT_OTHER): Admitting: Oncology

## 2024-03-12 ENCOUNTER — Inpatient Hospital Stay: Attending: Oncology

## 2024-03-12 ENCOUNTER — Inpatient Hospital Stay

## 2024-03-12 ENCOUNTER — Telehealth: Payer: Self-pay | Admitting: Oncology

## 2024-03-12 VITALS — BP 140/77 | HR 68 | Temp 98.2°F | Resp 16 | Ht 69.39 in | Wt 229.4 lb

## 2024-03-12 DIAGNOSIS — Z79620 Long term (current) use of immunosuppressive biologic: Secondary | ICD-10-CM | POA: Insufficient documentation

## 2024-03-12 DIAGNOSIS — Z5112 Encounter for antineoplastic immunotherapy: Secondary | ICD-10-CM | POA: Insufficient documentation

## 2024-03-12 DIAGNOSIS — C792 Secondary malignant neoplasm of skin: Secondary | ICD-10-CM

## 2024-03-12 DIAGNOSIS — C439 Malignant melanoma of skin, unspecified: Secondary | ICD-10-CM

## 2024-03-12 DIAGNOSIS — C4372 Malignant melanoma of left lower limb, including hip: Secondary | ICD-10-CM | POA: Diagnosis not present

## 2024-03-12 DIAGNOSIS — R918 Other nonspecific abnormal finding of lung field: Secondary | ICD-10-CM

## 2024-03-12 LAB — CMP (CANCER CENTER ONLY)
ALT: 23 U/L (ref 0–44)
AST: 22 U/L (ref 15–41)
Albumin: 4.2 g/dL (ref 3.5–5.0)
Alkaline Phosphatase: 88 U/L (ref 38–126)
Anion gap: 12 (ref 5–15)
BUN: 27 mg/dL — ABNORMAL HIGH (ref 8–23)
CO2: 24 mmol/L (ref 22–32)
Calcium: 9.3 mg/dL (ref 8.9–10.3)
Chloride: 104 mmol/L (ref 98–111)
Creatinine: 1.22 mg/dL (ref 0.61–1.24)
GFR, Estimated: >60 mL/min (ref >60)
Glucose, Bld: 99 mg/dL (ref 70–99)
Potassium: 4 mmol/L (ref 3.5–5.1)
Sodium: 140 mmol/L (ref 135–145)
Total Bilirubin: 0.7 mg/dL (ref 0.0–1.2)
Total Protein: 7.1 g/dL (ref 6.5–8.1)

## 2024-03-12 LAB — CBC WITH DIFFERENTIAL (CANCER CENTER ONLY)
Abs Immature Granulocytes: 0.03 K/uL (ref 0.00–0.07)
Basophils Absolute: 0 K/uL (ref 0.0–0.1)
Basophils Relative: 0 %
Eosinophils Absolute: 0.2 K/uL (ref 0.0–0.5)
Eosinophils Relative: 2 %
HCT: 41.6 % (ref 39.0–52.0)
Hemoglobin: 13.9 g/dL (ref 13.0–17.0)
Immature Granulocytes: 0 %
Lymphocytes Relative: 27 %
Lymphs Abs: 2.8 K/uL (ref 0.7–4.0)
MCH: 29.6 pg (ref 26.0–34.0)
MCHC: 33.4 g/dL (ref 30.0–36.0)
MCV: 88.5 fL (ref 80.0–100.0)
Monocytes Absolute: 1 K/uL (ref 0.1–1.0)
Monocytes Relative: 9 %
Neutro Abs: 6.5 K/uL (ref 1.7–7.7)
Neutrophils Relative %: 62 %
Platelet Count: 220 K/uL (ref 150–400)
RBC: 4.7 MIL/uL (ref 4.22–5.81)
RDW: 13 % (ref 11.5–15.5)
WBC Count: 10.5 K/uL (ref 4.0–10.5)
nRBC: 0 % (ref 0.0–0.2)

## 2024-03-12 LAB — TSH: TSH: 1.617 u[IU]/mL (ref 0.350–4.500)

## 2024-03-12 MED ORDER — SODIUM CHLORIDE 0.9 % IV SOLN
480.0000 mg | Freq: Once | INTRAVENOUS | Status: AC
  Administered 2024-03-12: 480 mg via INTRAVENOUS
  Filled 2024-03-12: qty 48

## 2024-03-12 MED ORDER — SODIUM CHLORIDE 0.9% FLUSH
10.0000 mL | INTRAVENOUS | Status: DC | PRN
  Administered 2024-03-12: 10 mL

## 2024-03-12 MED ORDER — SODIUM CHLORIDE 0.9 % IV SOLN: Freq: Once | INTRAVENOUS | Status: AC

## 2024-03-12 MED ORDER — HEPARIN SOD (PORK) LOCK FLUSH 100 UNIT/ML IV SOLN
500.0000 [IU] | Freq: Once | INTRAVENOUS | Status: AC | PRN
Start: 2024-03-12 — End: 2024-03-12
  Administered 2024-03-12: 500 [IU]

## 2024-03-12 NOTE — Patient Instructions (Signed)
 Nivolumab Injection What is this medication? NIVOLUMAB (nye VOL ue mab) treats some types of cancer. It works by helping your immune system slow or stop the spread of cancer cells. It is a monoclonal antibody. This medicine may be used for other purposes; ask your health care provider or pharmacist if you have questions. COMMON BRAND NAME(S): Opdivo What should I tell my care team before I take this medication? They need to know if you have any of these conditions: Allogeneic stem cell transplant (uses someone else's stem cells) Autoimmune diseases, such as Crohn disease, ulcerative colitis, lupus History of chest radiation Nervous system problems, such as Guillain-Barre syndrome or myasthenia gravis Organ transplant An unusual or allergic reaction to nivolumab, other medications, foods, dyes, or preservatives Pregnant or trying to get pregnant Breast-feeding How should I use this medication? This medication is infused into a vein. It is given in a hospital or clinic setting. A special MedGuide will be given to you before each treatment. Be sure to read this information carefully each time. Talk to your care team about the use of this medication in children. While it may be prescribed for children as young as 12 years for selected conditions, precautions do apply. Overdosage: If you think you have taken too much of this medicine contact a poison control center or emergency room at once. NOTE: This medicine is only for you. Do not share this medicine with others. What if I miss a dose? Keep appointments for follow-up doses. It is important not to miss your dose. Call your care team if you are unable to keep an appointment. What may interact with this medication? Interactions have not been studied. This list may not describe all possible interactions. Give your health care provider a list of all the medicines, herbs, non-prescription drugs, or dietary supplements you use. Also tell them if you  smoke, drink alcohol, or use illegal drugs. Some items may interact with your medicine. What should I watch for while using this medication? Your condition will be monitored carefully while you are receiving this medication. You may need blood work while taking this medication. This medication may cause serious skin reactions. They can happen weeks to months after starting the medication. Contact your care team right away if you notice fevers or flu-like symptoms with a rash. The rash may be red or purple and then turn into blisters or peeling of the skin. You may also notice a red rash with swelling of the face, lips, or lymph nodes in your neck or under your arms. Tell your care team right away if you have any change in your eyesight. Talk to your care team if you are pregnant or think you might be pregnant. A negative pregnancy test is required before starting this medication. A reliable form of contraception is recommended while taking this medication and for 5 months after the last dose. Talk to your care team about effective forms of contraception. Do not breast-feed while taking this medication and for 5 months after the last dose. What side effects may I notice from receiving this medication? Side effects that you should report to your care team as soon as possible: Allergic reactions--skin rash, itching, hives, swelling of the face, lips, tongue, or throat Dry cough, shortness of breath or trouble breathing Eye pain, redness, irritation, or discharge with blurry or decreased vision Heart muscle inflammation--unusual weakness or fatigue, shortness of breath, chest pain, fast or irregular heartbeat, dizziness, swelling of the ankles, feet, or hands Hormone  gland problems--headache, sensitivity to light, unusual weakness or fatigue, dizziness, fast or irregular heartbeat, increased sensitivity to cold or heat, excessive sweating, constipation, hair loss, increased thirst or amount of urine,  tremors or shaking, irritability Infusion reactions--chest pain, shortness of breath or trouble breathing, feeling faint or lightheaded Kidney injury (glomerulonephritis)--decrease in the amount of urine, red or dark brown urine, foamy or bubbly urine, swelling of the ankles, hands, or feet Liver injury--right upper belly pain, loss of appetite, nausea, light-colored stool, dark yellow or brown urine, yellowing skin or eyes, unusual weakness or fatigue Pain, tingling, or numbness in the hands or feet, muscle weakness, change in vision, confusion or trouble speaking, loss of balance or coordination, trouble walking, seizures Rash, fever, and swollen lymph nodes Redness, blistering, peeling, or loosening of the skin, including inside the mouth Sudden or severe stomach pain, bloody diarrhea, fever, nausea, vomiting Side effects that usually do not require medical attention (report these to your care team if they continue or are bothersome): Bone, joint, or muscle pain Diarrhea Fatigue Loss of appetite Nausea Skin rash This list may not describe all possible side effects. Call your doctor for medical advice about side effects. You may report side effects to FDA at 1-800-FDA-1088. Where should I keep my medication? This medication is given in a hospital or clinic. It will not be stored at home. NOTE: This sheet is a summary. It may not cover all possible information. If you have questions about this medicine, talk to your doctor, pharmacist, or health care provider.  2024 Elsevier/Gold Standard (2021-12-05 00:00:00)   [facility] [place] [facility]  Discharge Instructions: Thank you for choosing [facility] to provide your oncology and hematology care.  If you have a lab appointment with the Cancer Center, please go directly to the Cancer Center and check in at the registration area.   Wear comfortable clothing and clothing appropriate for easy  access to any Portacath or PICC line.   We strive to give you quality time with your provider. You may need to reschedule your appointment if you arrive late (15 or more minutes).  Arriving late affects you and other patients whose appointments are after yours.  Also, if you miss three or more appointments without notifying the office, you may be dismissed from the clinic at the provider's discretion.      For prescription refill requests, have your pharmacy contact our office and allow 72 hours for refills to be completed.    Today you received the following chemotherapy and/or immunotherapy agents NIVOLUMAB      To help prevent nausea and vomiting after your treatment, we encourage you to take your nausea medication as directed.  BELOW ARE SYMPTOMS THAT SHOULD BE REPORTED IMMEDIATELY: *FEVER GREATER THAN 100.4 F (38 C) OR HIGHER *CHILLS OR SWEATING *NAUSEA AND VOMITING THAT IS NOT CONTROLLED WITH YOUR NAUSEA MEDICATION *UNUSUAL SHORTNESS OF BREATH *UNUSUAL BRUISING OR BLEEDING *URINARY PROBLEMS (pain or burning when urinating, or frequent urination) *BOWEL PROBLEMS (unusual diarrhea, constipation, pain near the anus) TENDERNESS IN MOUTH AND THROAT WITH OR WITHOUT PRESENCE OF ULCERS (sore throat, sores in mouth, or a toothache) UNUSUAL RASH, SWELLING OR PAIN  UNUSUAL VAGINAL DISCHARGE OR ITCHING   Items with * indicate a potential emergency and should be followed up as soon as possible or go to the Emergency Department if any problems should occur.  Please show the CHEMOTHERAPY ALERT CARD or IMMUNOTHERAPY ALERT CARD at  check-in to the Emergency Department and triage nurse.  Should you have questions after your visit or need to cancel or reschedule your appointment, please contact [facility] [place] [facility]  Dept: [phone]  and follow the prompts.  Office hours are 8:00 a.m. to 4:30 p.m. Monday - Friday. Please note that voicemails left after  4:00 p.m. may not be returned until the following business day.  We are closed weekends and major holidays. You have access to a nurse at all times for urgent questions. Please call the main number to the clinic Dept: [phone] and follow the prompts.  For any non-urgent questions, you may also contact your provider using MyChart. We now offer e-Visits for anyone 58 and older to request care online for non-urgent symptoms. For details visit [email or web address].   Also download the MyChart app! Go to the app store, search "MyChart", open the app, select [place], and log in with your MyChart username and password.

## 2024-03-12 NOTE — Telephone Encounter (Signed)
 Patient has been scheduled for follow-up visit per 03/12/24 LOS.  Pt given an appt calendar with date and time.

## 2024-03-13 ENCOUNTER — Other Ambulatory Visit: Payer: Self-pay

## 2024-03-13 LAB — T4: T4, Total: 8 ug/dL (ref 4.5–12.0)

## 2024-03-23 ENCOUNTER — Encounter: Payer: Self-pay | Admitting: Oncology

## 2024-04-01 ENCOUNTER — Other Ambulatory Visit: Payer: Self-pay

## 2024-04-08 NOTE — Progress Notes (Signed)
 [facility]  [address] [place],  [place]  [address] [phone]  Clinic Day:04/09/24  Referring physician: Fernand Money, MD  ASSESSMENT & PLAN:  Assessment: Malignant melanoma of the left foot He had a punch biopsy and this revealed a Breslow thickness of 1.6 mm and Clark's level 4 with ulceration for a T2b N0 M0 clinically.  We found no distant metastasis on PET scan, but he did have metastasis to the left lower extremity.  His tumor is positive for BRAF and PDL1 mutation. He had this treated with wide local excision.   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 He is now on maintenance Nivolumab .  Multiple lesions of the lung These were suspicious for metastatic disease but resolved on the CT scan in December, 2023. PET scan in June was negative, but we cannot absolutely rule out metastatic disease since these lesions were too small to biopsy. We discussed the fact that the resolution of these lesions could represent a response to treatment, or may not have been malignant at all.  We think it is more likely the former, and so I recommend continuation of therapy.  We have had several discussions about this. His latest scan from January 2025 shows resolution of a multitude of pulmonary nodules. We will continue the maintenance nivolumab .   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 He then had this area resected and pathology revealed residual squamous cell carcinoma with clear margins and he is healing well.   Spinal stenosis at L4-5 He has had surgical decompression and resection of a synovial cyst in June 2023. His back pain seems to be acting up again.    Leukocytosis He has had chronic  leukocytosis and we did not find an explanation but the [facility] continue to fluctuate up and down. His WBC was normal last month for the first time in years but is now elevated again. We will just monitor this.   Lymphedema of the Left Lower Extremity We discussed this diagnosis and the limited approaches available. This is improved.  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    Plan: He had a CT chest, abdomen, and pelvis done on 03/06/2024 which revealed no significant interval change, no developing new mass lesion, fluid collection or lymph node enlargement, and a few tiny lung nodules are essentially stable with no increasing or dominant lung nodule. His day 1 cycle 28 of Nivolumab  is scheduled on 04/09/2024. He has a elevated WBC of 11.0, slightly low hemoglobin of 12.9 down from 13.9, and platelet count of 240,000. His CMP is normal other than a chronic elevated BUN of 24. His TSH and T4 today is pending. I will see him back in 4 weeks with CBC, CMP, TSH, and T4. The patient understands the plans discussed today and is in agreement with them.  He knows to contact our office if he develops concerns prior to his next appointment.  I provided 7 minutes of face-to-face time during this encounter and > 50% was spent counseling as documented under my assessment and plan.   Benjamin VEAR Cornish, MD  [name] [address] [place] [address] Dept: [phone] Dept Fax: [phone]  No orders of the defined types were placed in this encounter.   CHIEF COMPLAINT:  CC: Malignant melanoma   Current Treatment:  Evaluation and immunotherapy   HISTORY OF PRESENT ILLNESS:  [name] is a 78 y.o. male with a history of malignant melanoma who is referred in consultation with Dr. Tracey Anthony for assessment and management.  He had a lesion of the dorsum of  the left foot in September 2022 which appeared to be a granuloma and was treated in the office.  He later developed a another lesion lateral to this on the dorsum of the left foot which was not hyperpigmented but did bleed easily.  He also has several lesions of his left medial calf which are nodular and span approximately 6 cm.  He has had some increased edema of the left lower leg.  He was evaluated by dermatology on June 5, who felt this could be a squamous cell carcinoma versus tinea or a drug reaction.  He had biopsies done of both the foot lesion and the Lesion.  Pathology came back that the left dorsal foot shave biopsy revealed a malignant melanoma possibly a primary nodular ulcerated melanoma which had a Breslow thickness of at least 1.6 mm and a Clark's level 4.  Margins are positive as this was a shave biopsy.  Ulceration is present but no satellitosis.  The mitotic index is 4/mm and no lymphovascular invasion was identified.  No brisk tumor infiltrating lymphocytes are seen and tumor regression is absent.  This is felt to be at least a T2b lesion and wide excision was recommended.  The lesion of the left medial calf revealed a dermal map melanoma most consistent with metastatic disease. BRAF and PDL1 are positive.  This information was not available when the patient was admitted for back surgery on June 8 and he had a microlumbar decompression at L4-5 for spinal stenosis.  He was also found to have a synovial cyst at that time which was resected and benign.  He was readmitted several days later for acute pain of the right elbow and both arms.  Aspiration was obtained and this was diagnosed as gouty arthritis.  He did have temporary rehab after his back surgery.  His left leg was evaluated for deep venous thrombosis and was negative.  While he was recuperating from his surgery he had an x-ray which was abnormal which led to a CT scan which revealed at least 3 lung lesions.  MRI of the brain was  negative and he is now referred for management of the malignant melanoma. PET scan on 01/31/2023  revealed interval resolution of previously demonstrated hypermetabolic activity within the left foot, no evidence of local recurrence of metastatic disease, resolution of right middle lobe airspace diseases seen on most recent CT. No suspicious pulmonary nodularity, new opacification of the maxillary sinus with mild peripheral hypermetabolic activity, likely inflammatory, and aortic atherosclerosis.  CT chest, abdomen, and pelvis done on 09/07/2023 that revealed the previously present multitude of the right middle lobe pulmonary micro nodule and nodules have essentially resolved, with a 3 mm residual nodule likely represent sequela of infectious or inflammatory process and no evidence of metastatic disease to the thorax, abdomen, or pelvis.   Oncology History  Malignant melanoma, metastatic (HCC)  01/23/2022 Cancer Staging   Staging form: Melanoma of the Skin, AJCC 8th Edition -  Clinical stage from 01/23/2022: Stage III (cT2b, cN1c, cM0) - Signed by Cornelius Benjamin DEL, MD on 04/07/2022 Histopathologic type: Malignant melanoma, NOS (except juvenile melanoma M-8770/[id number]) Stage prefix: Initial diagnosis Laterality: Left Lymph-vascular invasion (LVI): LVI not present (absent)/not identified Diagnostic confirmation: Positive histology Specimen type: Biopsy / Limited Resection Staged by: Managing physician Mitotic count: 4 Mitotic unit: mm2 Clark's level: Level IV Tumor-infiltrating lymphocytes: Present and non-brisk Breslow depth (mm): 16 Ulceration of the epidermis: Yes Microsatellites: No Primary tumor regression: Absent Intransit/satellite metastasis: In-transit Lymph node clinically or radiologically detected: No Microscopic confirmation of metastasis: No Sentinel lymph node biopsy performed: No Completion or therapeutic lymph node dissection performed: No Matted nodes: No Stage used in treatment  planning: Yes National guidelines used in treatment planning: Yes Type of national guideline used in treatment planning: NCCN   01/31/2022 Initial Diagnosis   Malignant melanoma, metastatic (HCC)   04/14/2022 -  Chemotherapy   Patient is on Treatment Plan : MELANOMA Nivolumab  (480) q28d (changed from q14d on 12/4)     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)  02/28/2022 Cancer Staging   Staging form: Melanoma of the Skin, AJCC 8th Edition - Clinical stage from 02/28/2022: Stage IV (cT2b(m), cN1c, cM1a) - Signed by Cornelius Benjamin DEL, MD on 04/07/2022 Histopathologic type: Malignant melanoma, NOS (except juvenile melanoma M-8770/[id number]) Stage prefix: Initial diagnosis Laterality: Left Multiple tumors: Yes Lymph-vascular invasion (LVI): LVI present/identified, NOS Diagnostic confirmation: Positive histology Specimen type: Excision Staged by: Managing physician Intransit/satellite metastasis: Both in-transit and satellite Prognostic indicators: 02/28/22:  Wide excision of primary of foot - no residual melanoma,  Excision of skin of calf consistent with intransit mets of dermal/subcuticular   Melanoma with LVI, metastatic disease with multifocal lesions and multifocal positive margins 03/24/22: Re-excision with a few atypical melanocytes, no melanoma and clear margins   04/07/2022 Initial Diagnosis   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 He also had pulmonary nodules which have resolved so we are not sure whether they are metastases or not. He is therefore on maintenance immunotherapy with nivolumab . Patient states that he feels well but complains of chronic back pain rating 5/10. He had a CT chest, abdomen, and  pelvis done on 03/06/2024 which revealed no significant interval change, no developing new mass lesion, fluid collection or lymph node enlargement, and a few tiny lung nodules are essentially stable with no increasing or dominant lung nodule. His day 1 cycle 28 of Nivolumab  is scheduled on 04/09/2024. He has a elevated WBC of 11.0, slightly low hemoglobin of 12.9 down from 13.9, and platelet count of 240,000. His CMP is normal other than a chronic elevated BUN of 24. His TSH and T4 today is pending. I will see him back in 4 weeks with CBC, CMP, TSH, and T4. He denies fever, chills, night sweats, or other signs of infection. He denies cardiorespiratory and gastrointestinal issues. He  denies pain. His appetite is good and His weight has increased 4 pounds over last 4 weeks.  REVIEW OF SYSTEMS:  Review of Systems  Constitutional: Negative.  Negative for appetite change, chills, diaphoresis, fatigue, fever and unexpected weight change.  HENT:  Negative.  Negative for hearing loss, lump/mass, mouth sores,  nosebleeds, sore throat, tinnitus, trouble swallowing and voice change.   Eyes: Negative.  Negative for eye problems and icterus.  Respiratory:  Negative for chest tightness, cough, hemoptysis, shortness of breath and wheezing.   Cardiovascular: Negative.  Negative for chest pain, leg swelling and palpitations.  Gastrointestinal:  Positive for diarrhea (on and off). Negative for abdominal distention, abdominal pain, blood in stool, constipation, nausea, rectal pain and vomiting.  Endocrine: Negative.  Negative for hot flashes.  Genitourinary: Negative.  Negative for bladder incontinence, difficulty urinating, dyspareunia, dysuria, frequency, hematuria, nocturia, pelvic pain and penile discharge.   Musculoskeletal:  Positive for back pain (chronic, intermittent, 5/10). Negative for arthralgias, flank pain, gait problem, myalgias, neck pain and neck stiffness.       Occasional left shoulder pain   Skin:  Negative.  Negative for itching, rash and wound.  Neurological: Negative.  Negative for dizziness, extremity weakness, gait problem, headaches, light-headedness, numbness, seizures and speech difficulty.  Hematological: Negative.  Negative for adenopathy. Does not bruise/bleed easily.  Psychiatric/Behavioral: Negative.  Negative for confusion, decreased concentration, depression, sleep disturbance and suicidal ideas. The patient is not nervous/anxious.      VITALS:  Blood pressure (!) 141/77, pulse 67, temperature 97.7 F (36.5 C), temperature source Oral, resp. rate 18, height 5' 9.39 (1.763 m), weight 233 lb (105.7 kg), SpO2 98%.  Wt Readings from Last 3 Encounters:  04/09/24 233 lb (105.7 kg)  03/12/24 229 lb 6.4 oz (104.1 kg)  02/13/24 230 lb 1.6 oz (104.4 kg)    Body mass index is 34.02 kg/m.  Performance status (ECOG): 1 - Symptomatic but completely ambulatory  PHYSICAL EXAM:  Physical Exam Vitals and nursing note reviewed.  Constitutional:      General: He is not in acute distress.    Appearance: Normal appearance. He is normal weight. He is not ill-appearing, toxic-appearing or diaphoretic.  HENT:     Head: Normocephalic and atraumatic.     Right Ear: Tympanic membrane, ear canal and external ear normal. There is no impacted cerumen.     Left Ear: Tympanic membrane, ear canal and external ear normal. There is no impacted cerumen.     Nose: Nose normal. No congestion or rhinorrhea.     Mouth/Throat:     Mouth: Mucous membranes are moist.     Pharynx: Oropharynx is clear. No oropharyngeal exudate or posterior oropharyngeal erythema.  Eyes:     General: No scleral icterus.       Right eye: No discharge.        Left eye: No discharge.     Extraocular Movements: Extraocular movements intact.     Conjunctiva/sclera: Conjunctivae normal.     Pupils: Pupils are equal, round, and reactive to light.  Cardiovascular:     Rate and Rhythm: Normal rate and regular rhythm.      Pulses: Normal pulses.     Heart sounds: Normal heart sounds. No murmur heard.    No friction rub. No gallop.  Pulmonary:     Effort: Pulmonary effort is normal. No respiratory distress.     Breath sounds: No stridor. No wheezing, rhonchi or rales.  Chest:     Chest wall: No tenderness.     Comments: Healing incision in the upper sternal area Abdominal:     General: Bowel sounds are normal. There is no distension.     Palpations: Abdomen is soft. There is no hepatomegaly, splenomegaly or mass.     Tenderness: There is no abdominal tenderness. There is  no right CVA tenderness, left CVA tenderness, guarding or rebound.     Hernia: No hernia is present.  Musculoskeletal:        General: Normal range of motion.     Cervical back: Normal range of motion and neck supple. No tenderness.     Right lower leg: No edema.     Left lower leg: 1+ Edema present.  Lymphadenopathy:     Cervical: No cervical adenopathy.     Upper Body:     Right upper body: No supraclavicular or axillary adenopathy.     Left upper body: No supraclavicular or axillary adenopathy.     Lower Body: No right inguinal adenopathy. No left inguinal adenopathy.  Skin:    General: Skin is warm and dry.     Coloration: Skin is not jaundiced.     Findings: No rash. Scaling rash: Scattered benign appearing nevi.     Comments: Large healing scar of the left medial knee without evidence of recurrence. Scattered benign appearing nevi. Vitiligo of the skin of the left lower extremity  Eschar in the mid tibia which is healing well  Neurological:     General: No focal deficit present.     Mental Status: He is alert and oriented to person, place, and time. Mental status is at baseline.     Cranial Nerves: No cranial nerve deficit.     Sensory: No sensory deficit.     Motor: No weakness.     Coordination: Coordination normal.     Gait: Gait normal.     Deep Tendon Reflexes: Reflexes normal.  Psychiatric:        Mood and Affect:  Mood normal.        Behavior: Behavior normal.        Thought Content: Thought content normal.        Judgment: Judgment normal.    LABS:      Latest Ref Rng & Units 04/09/2024   12:49 PM 03/12/2024   12:56 PM 02/13/2024   12:56 PM  CBC  WBC 4.0 - 10.5 K/uL 11.0  10.5  20.4   Hemoglobin 13.0 - 17.0 g/dL 87.0  86.0  87.0   Hematocrit 39.0 - 52.0 % 39.1  41.6  38.8   Platelets 150 - 400 K/uL 240  220  230       Latest Ref Rng & Units 04/09/2024   12:49 PM 03/12/2024   12:56 PM 02/13/2024   12:56 PM  CMP  Glucose 70 - 99 mg/dL 99  99  877   BUN 8 - 23 mg/dL 24  27  25    Creatinine 0.61 - 1.24 mg/dL 8.93  8.77  8.73   Sodium 135 - 145 mmol/L 141  140  136   Potassium 3.5 - 5.1 mmol/L 4.3  4.0  3.8   Chloride 98 - 111 mmol/L 106  104  100   CO2 22 - 32 mmol/L 25  24  23    Calcium 8.9 - 10.3 mg/dL 9.4  9.3  9.1   Total Protein 6.5 - 8.1 g/dL 6.6  7.1  6.8   Total Bilirubin 0.0 - 1.2 mg/dL 0.6  0.7  1.1   Alkaline Phos 38 - 126 U/L 87  88  87   AST 15 - 41 U/L 22  22  18    ALT 0 - 44 U/L 25  23  16     Lab Results  Component Value Date   LDH 140 02/01/2023  LDH 160 02/07/2022   Lab Results  Component Value Date   TSH 0.602 04/09/2024   T4TOTAL 6.7 04/09/2024   STUDIES:  EXAM: 03/06/2024 CT CHEST, ABDOMEN, AND PELVIS WITH CONTRAST IMPRESSION: Overall no significant interval change. No developing new mass lesion, fluid collection or lymph node enlargement. Few tiny lung nodules are essentially stable when adjusted for technique. No increasing or dominant lung nodule. No bowel obstruction, free air or free fluid.  Scattered stool. Bilateral Bosniak 1 renal cysts.  Pathology: 12/24/2023 Diagnosis: Residual Squamous Cell Carcinoma Clear margins   EXAM: 01/16/2024 CHEST - 2 VIEW IMPRESSION: No active cardiopulmonary disease.  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 , 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     Past Surgical History:  Procedure Laterality Date   LUMBAR LAMINECTOMY/DECOMPRESSION MICRODISCECTOMY N/A 01/26/2022   Procedure: Microlumbar decompression Lumbar four-five, lateral mass fusion with autograft and allograft bone;  Surgeon: Duwayne Purchase, MD;  Location: [facility] OR;  Service: Orthopedics;  Laterality: N/A;   PARTIAL GASTRECTOMY     TOTAL HIP ARTHROPLASTY Right 06/18/2018   Procedure: RIGHT TOTAL HIP ARTHROPLASTY ANTERIOR APPROACH;  Surgeon: Ernie Cough, MD;  Location: [facility] ORS;  Service: Orthopedics;  Laterality: Right;    Family History  Problem Relation Age of Onset   Lung cancer Sister    Melanoma Brother     Social History:  reports that he has quit smoking. He has never used smokeless tobacco. He reports that he does not currently use alcohol. He reports that he does not use drugs.The patient is alone today.  Allergies:  Allergies  Allergen Reactions   Morphine Itching    Patient states it is tolerable itching    Current Medications: Current Outpatient Medications  Medication Sig Dispense Refill   amLODipine  (NORVASC ) 5 MG tablet Take 5 mg by mouth daily.     Calcium Carbonate (CALCIUM 600 PO) Take 1 tablet by mouth 2 (two) times daily.     clopidogrel  (PLAVIX ) 75 MG tablet Take 1 tablet by mouth daily.     docusate sodium  (COLACE) 100 MG capsule 1 capsule 2 TIMES DAILY (route: oral)     famotidine  (PEPCID ) 40 MG tablet TAKE 1 TABLET EVERY DAY 90 tablet 3   fluconazole  (DIFLUCAN ) 200 MG tablet Take 1 tablet (200 mg total) by mouth daily. 10  tablet 1   folic acid  (FOLVITE ) 400 MCG tablet Take 400 mcg by mouth daily.     gabapentin  (NEURONTIN ) 300 MG capsule Take 1 capsule (300 mg total) by mouth 3 (three) times daily. 270 capsule 3   hydrochlorothiazide  (HYDRODIURIL ) 12.5 MG tablet Take 12.5 mg by mouth daily.     lisinopril  (ZESTRIL ) 30 MG tablet Take 30 mg by mouth daily.     Multiple Vitamin (MULTIVITAMIN WITH MINERALS) TABS tablet Take 1 tablet by mouth in the morning.     mupirocin ointment (BACTROBAN) 2 %  Apply topically.     neomycin -polymyxin-hydrocortisone (CORTISPORIN) OTIC solution Place 3 drops into the right ear 4 (four) times daily. 10 mL 1   nystatin  (MYCOSTATIN /NYSTOP ) powder Apply 1 Application topically 3 (three) times daily. 15 g 0   Omega-3 1000 MG CAPS Take by mouth.     ondansetron  (ZOFRAN ) 4 MG tablet Take 1 tablet (4 mg total) by mouth every 4 (four) hours as needed for nausea. 90 tablet 3   oxyCODONE  (OXY IR/ROXICODONE ) 5 MG immediate release tablet Take 1 tablet (5 mg total) by mouth every 6 (six) hours as needed for severe pain. (Patient not taking: Reported on 07/27/2023) 30 tablet 0   polyethylene glycol (MIRALAX  / GLYCOLAX ) 17 g packet Take 17 g by mouth daily. 14 each 0   pravastatin  (PRAVACHOL ) 40 MG tablet Take 40 mg by mouth daily.     prochlorperazine  (COMPAZINE ) 10 MG tablet Take 1 tablet (10 mg total) by mouth every 6 (six) hours as needed for nausea or vomiting. 90 tablet 3   traZODone  (DESYREL ) 50 MG tablet TAKE 1 TABLET AT BEDTIME AS NEEDED FOR SLEEP 90 tablet 3   triamcinolone  cream (KENALOG ) 0.1 % Apply topically 2 (two) times daily.     No current facility-administered medications for this visit.    I,Jasmine M Lassiter,acting as a scribe for Benjamin VEAR Cornish, MD.,have documented all relevant documentation on the behalf of Benjamin VEAR Cornish, MD,as directed by  Benjamin VEAR Cornish, MD while in the presence of Benjamin VEAR Cornish, MD.

## 2024-04-09 ENCOUNTER — Inpatient Hospital Stay (HOSPITAL_BASED_OUTPATIENT_CLINIC_OR_DEPARTMENT_OTHER): Admitting: Oncology

## 2024-04-09 ENCOUNTER — Inpatient Hospital Stay: Attending: Oncology

## 2024-04-09 ENCOUNTER — Inpatient Hospital Stay

## 2024-04-09 ENCOUNTER — Encounter: Payer: Self-pay | Admitting: Oncology

## 2024-04-09 VITALS — BP 141/77 | HR 67 | Temp 97.7°F | Resp 18 | Ht 69.39 in | Wt 233.0 lb

## 2024-04-09 DIAGNOSIS — C4372 Malignant melanoma of left lower limb, including hip: Secondary | ICD-10-CM | POA: Diagnosis not present

## 2024-04-09 DIAGNOSIS — Z79620 Long term (current) use of immunosuppressive biologic: Secondary | ICD-10-CM | POA: Diagnosis not present

## 2024-04-09 DIAGNOSIS — C439 Malignant melanoma of skin, unspecified: Secondary | ICD-10-CM

## 2024-04-09 DIAGNOSIS — Z5112 Encounter for antineoplastic immunotherapy: Secondary | ICD-10-CM | POA: Diagnosis not present

## 2024-04-09 DIAGNOSIS — C792 Secondary malignant neoplasm of skin: Secondary | ICD-10-CM

## 2024-04-09 LAB — CBC WITH DIFFERENTIAL (CANCER CENTER ONLY)
Abs Immature Granulocytes: 0.03 K/uL (ref 0.00–0.07)
Basophils Absolute: 0 K/uL (ref 0.0–0.1)
Basophils Relative: 0 %
Eosinophils Absolute: 0.4 K/uL (ref 0.0–0.5)
Eosinophils Relative: 3 %
HCT: 39.1 % (ref 39.0–52.0)
Hemoglobin: 12.9 g/dL — ABNORMAL LOW (ref 13.0–17.0)
Immature Granulocytes: 0 %
Lymphocytes Relative: 23 %
Lymphs Abs: 2.6 K/uL (ref 0.7–4.0)
MCH: 29.5 pg (ref 26.0–34.0)
MCHC: 33 g/dL (ref 30.0–36.0)
MCV: 89.3 fL (ref 80.0–100.0)
Monocytes Absolute: 0.9 K/uL (ref 0.1–1.0)
Monocytes Relative: 8 %
Neutro Abs: 7.1 K/uL (ref 1.7–7.7)
Neutrophils Relative %: 66 %
Platelet Count: 240 K/uL (ref 150–400)
RBC: 4.38 MIL/uL (ref 4.22–5.81)
RDW: 13.2 % (ref 11.5–15.5)
WBC Count: 11 K/uL — ABNORMAL HIGH (ref 4.0–10.5)
nRBC: 0 % (ref 0.0–0.2)

## 2024-04-09 LAB — CMP (CANCER CENTER ONLY)
ALT: 25 U/L (ref 0–44)
AST: 22 U/L (ref 15–41)
Albumin: 4.2 g/dL (ref 3.5–5.0)
Alkaline Phosphatase: 87 U/L (ref 38–126)
Anion gap: 11 (ref 5–15)
BUN: 24 mg/dL — ABNORMAL HIGH (ref 8–23)
CO2: 25 mmol/L (ref 22–32)
Calcium: 9.4 mg/dL (ref 8.9–10.3)
Chloride: 106 mmol/L (ref 98–111)
Creatinine: 1.06 mg/dL (ref 0.61–1.24)
GFR, Estimated: >60 mL/min (ref >60)
Glucose, Bld: 99 mg/dL (ref 70–99)
Potassium: 4.3 mmol/L (ref 3.5–5.1)
Sodium: 141 mmol/L (ref 135–145)
Total Bilirubin: 0.6 mg/dL (ref 0.0–1.2)
Total Protein: 6.6 g/dL (ref 6.5–8.1)

## 2024-04-09 LAB — TSH: TSH: 0.602 u[IU]/mL (ref 0.350–4.500)

## 2024-04-09 MED ORDER — SODIUM CHLORIDE 0.9 % IV SOLN: Freq: Once | INTRAVENOUS | Status: AC

## 2024-04-09 MED ORDER — SODIUM CHLORIDE 0.9 % IV SOLN
480.0000 mg | Freq: Once | INTRAVENOUS | Status: AC
  Administered 2024-04-09: 480 mg via INTRAVENOUS
  Filled 2024-04-09: qty 48

## 2024-04-09 NOTE — Patient Instructions (Signed)
 Nivolumab Injection What is this medication? NIVOLUMAB (nye VOL ue mab) treats some types of cancer. It works by helping your immune system slow or stop the spread of cancer cells. It is a monoclonal antibody. This medicine may be used for other purposes; ask your health care provider or pharmacist if you have questions. COMMON BRAND NAME(S): Opdivo What should I tell my care team before I take this medication? They need to know if you have any of these conditions: Allogeneic stem cell transplant (uses someone else's stem cells) Autoimmune diseases, such as Crohn disease, ulcerative colitis, lupus History of chest radiation Nervous system problems, such as Guillain-Barre syndrome or myasthenia gravis Organ transplant An unusual or allergic reaction to nivolumab, other medications, foods, dyes, or preservatives Pregnant or trying to get pregnant Breast-feeding How should I use this medication? This medication is infused into a vein. It is given in a hospital or clinic setting. A special MedGuide will be given to you before each treatment. Be sure to read this information carefully each time. Talk to your care team about the use of this medication in children. While it may be prescribed for children as young as 12 years for selected conditions, precautions do apply. Overdosage: If you think you have taken too much of this medicine contact a poison control center or emergency room at once. NOTE: This medicine is only for you. Do not share this medicine with others. What if I miss a dose? Keep appointments for follow-up doses. It is important not to miss your dose. Call your care team if you are unable to keep an appointment. What may interact with this medication? Interactions have not been studied. This list may not describe all possible interactions. Give your health care provider a list of all the medicines, herbs, non-prescription drugs, or dietary supplements you use. Also tell them if you  smoke, drink alcohol, or use illegal drugs. Some items may interact with your medicine. What should I watch for while using this medication? Your condition will be monitored carefully while you are receiving this medication. You may need blood work while taking this medication. This medication may cause serious skin reactions. They can happen weeks to months after starting the medication. Contact your care team right away if you notice fevers or flu-like symptoms with a rash. The rash may be red or purple and then turn into blisters or peeling of the skin. You may also notice a red rash with swelling of the face, lips, or lymph nodes in your neck or under your arms. Tell your care team right away if you have any change in your eyesight. Talk to your care team if you are pregnant or think you might be pregnant. A negative pregnancy test is required before starting this medication. A reliable form of contraception is recommended while taking this medication and for 5 months after the last dose. Talk to your care team about effective forms of contraception. Do not breast-feed while taking this medication and for 5 months after the last dose. What side effects may I notice from receiving this medication? Side effects that you should report to your care team as soon as possible: Allergic reactions--skin rash, itching, hives, swelling of the face, lips, tongue, or throat Dry cough, shortness of breath or trouble breathing Eye pain, redness, irritation, or discharge with blurry or decreased vision Heart muscle inflammation--unusual weakness or fatigue, shortness of breath, chest pain, fast or irregular heartbeat, dizziness, swelling of the ankles, feet, or hands Hormone  gland problems--headache, sensitivity to light, unusual weakness or fatigue, dizziness, fast or irregular heartbeat, increased sensitivity to cold or heat, excessive sweating, constipation, hair loss, increased thirst or amount of urine,  tremors or shaking, irritability Infusion reactions--chest pain, shortness of breath or trouble breathing, feeling faint or lightheaded Kidney injury (glomerulonephritis)--decrease in the amount of urine, red or dark brown urine, foamy or bubbly urine, swelling of the ankles, hands, or feet Liver injury--right upper belly pain, loss of appetite, nausea, light-colored stool, dark yellow or brown urine, yellowing skin or eyes, unusual weakness or fatigue Pain, tingling, or numbness in the hands or feet, muscle weakness, change in vision, confusion or trouble speaking, loss of balance or coordination, trouble walking, seizures Rash, fever, and swollen lymph nodes Redness, blistering, peeling, or loosening of the skin, including inside the mouth Sudden or severe stomach pain, bloody diarrhea, fever, nausea, vomiting Side effects that usually do not require medical attention (report these to your care team if they continue or are bothersome): Bone, joint, or muscle pain Diarrhea Fatigue Loss of appetite Nausea Skin rash This list may not describe all possible side effects. Call your doctor for medical advice about side effects. You may report side effects to FDA at 1-800-FDA-1088. Where should I keep my medication? This medication is given in a hospital or clinic. It will not be stored at home. NOTE: This sheet is a summary. It may not cover all possible information. If you have questions about this medicine, talk to your doctor, pharmacist, or health care provider.  2024 [employer] (2021-12-05 00:00:00)

## 2024-04-10 ENCOUNTER — Other Ambulatory Visit: Payer: Self-pay

## 2024-04-10 LAB — T4: T4, Total: 6.7 ug/dL (ref 4.5–12.0)

## 2024-04-16 ENCOUNTER — Other Ambulatory Visit: Payer: Self-pay | Admitting: Oncology

## 2024-04-16 DIAGNOSIS — C439 Malignant melanoma of skin, unspecified: Secondary | ICD-10-CM

## 2024-05-01 ENCOUNTER — Encounter: Payer: Self-pay | Admitting: Oncology

## 2024-05-02 ENCOUNTER — Other Ambulatory Visit: Payer: Self-pay

## 2024-05-07 ENCOUNTER — Ambulatory Visit

## 2024-05-07 ENCOUNTER — Other Ambulatory Visit

## 2024-05-07 ENCOUNTER — Ambulatory Visit: Admitting: Hematology and Oncology

## 2024-05-09 ENCOUNTER — Inpatient Hospital Stay: Attending: Oncology

## 2024-05-09 ENCOUNTER — Other Ambulatory Visit: Payer: Self-pay | Admitting: Oncology

## 2024-05-09 ENCOUNTER — Encounter: Payer: Self-pay | Admitting: Oncology

## 2024-05-09 ENCOUNTER — Telehealth: Payer: Self-pay | Admitting: Oncology

## 2024-05-09 ENCOUNTER — Inpatient Hospital Stay (HOSPITAL_BASED_OUTPATIENT_CLINIC_OR_DEPARTMENT_OTHER): Admitting: Oncology

## 2024-05-09 ENCOUNTER — Inpatient Hospital Stay

## 2024-05-09 VITALS — BP 136/73 | HR 66 | Temp 98.1°F | Resp 16 | Ht 69.39 in | Wt 230.5 lb

## 2024-05-09 DIAGNOSIS — Z5112 Encounter for antineoplastic immunotherapy: Secondary | ICD-10-CM | POA: Insufficient documentation

## 2024-05-09 DIAGNOSIS — C439 Malignant melanoma of skin, unspecified: Secondary | ICD-10-CM

## 2024-05-09 DIAGNOSIS — C792 Secondary malignant neoplasm of skin: Secondary | ICD-10-CM

## 2024-05-09 DIAGNOSIS — C4372 Malignant melanoma of left lower limb, including hip: Secondary | ICD-10-CM | POA: Diagnosis not present

## 2024-05-09 DIAGNOSIS — Z79620 Long term (current) use of immunosuppressive biologic: Secondary | ICD-10-CM | POA: Diagnosis not present

## 2024-05-09 LAB — CBC WITH DIFFERENTIAL (CANCER CENTER ONLY)
Abs Immature Granulocytes: 0.03 K/uL (ref 0.00–0.07)
Basophils Absolute: 0 K/uL (ref 0.0–0.1)
Basophils Relative: 0 %
Eosinophils Absolute: 0.3 K/uL (ref 0.0–0.5)
Eosinophils Relative: 3 %
HCT: 42 % (ref 39.0–52.0)
Hemoglobin: 14.1 g/dL (ref 13.0–17.0)
Immature Granulocytes: 0 %
Lymphocytes Relative: 22 %
Lymphs Abs: 2.2 K/uL (ref 0.7–4.0)
MCH: 29.5 pg (ref 26.0–34.0)
MCHC: 33.6 g/dL (ref 30.0–36.0)
MCV: 87.9 fL (ref 80.0–100.0)
Monocytes Absolute: 0.8 K/uL (ref 0.1–1.0)
Monocytes Relative: 8 %
Neutro Abs: 6.6 K/uL (ref 1.7–7.7)
Neutrophils Relative %: 67 %
Platelet Count: 232 K/uL (ref 150–400)
RBC: 4.78 MIL/uL (ref 4.22–5.81)
RDW: 12.7 % (ref 11.5–15.5)
WBC Count: 9.9 K/uL (ref 4.0–10.5)
nRBC: 0 % (ref 0.0–0.2)

## 2024-05-09 LAB — CMP (CANCER CENTER ONLY)
ALT: 18 U/L (ref 0–44)
AST: 20 U/L (ref 15–41)
Albumin: 4.2 g/dL (ref 3.5–5.0)
Alkaline Phosphatase: 85 U/L (ref 38–126)
Anion gap: 12 (ref 5–15)
BUN: 30 mg/dL — ABNORMAL HIGH (ref 8–23)
CO2: 24 mmol/L (ref 22–32)
Calcium: 9 mg/dL (ref 8.9–10.3)
Chloride: 105 mmol/L (ref 98–111)
Creatinine: 1.18 mg/dL (ref 0.61–1.24)
GFR, Estimated: >60 mL/min (ref >60)
Glucose, Bld: 95 mg/dL (ref 70–99)
Potassium: 4.1 mmol/L (ref 3.5–5.1)
Sodium: 141 mmol/L (ref 135–145)
Total Bilirubin: 0.8 mg/dL (ref 0.0–1.2)
Total Protein: 6.9 g/dL (ref 6.5–8.1)

## 2024-05-09 LAB — TSH: TSH: 1.38 u[IU]/mL (ref 0.350–4.500)

## 2024-05-09 MED ORDER — SODIUM CHLORIDE 0.9 % IV SOLN: Freq: Once | INTRAVENOUS | Status: AC

## 2024-05-09 MED ORDER — SODIUM CHLORIDE 0.9 % IV SOLN
480.0000 mg | Freq: Once | INTRAVENOUS | Status: AC
  Administered 2024-05-09: 480 mg via INTRAVENOUS
  Filled 2024-05-09: qty 48

## 2024-05-09 NOTE — Progress Notes (Signed)
 [facility]  [address] [place],  [place]  [address] [phone]  Clinic Day:05/09/24  Referring physician: Cornelius Wanda DEL, MD  ASSESSMENT & PLAN:  Assessment: Malignant melanoma of the left foot He had a punch biopsy and this revealed a Breslow thickness of 1.6 mm and Clark's level 4 with ulceration for a T2b N0 M0 clinically.  We found no distant metastasis on PET scan, but he did have metastasis to the left lower extremity.  His tumor is positive for BRAF and PDL1 mutation. He had this treated with wide local excision.   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 He is now on maintenance Nivolumab .  Multiple lesions of the lung These were suspicious for metastatic disease but resolved on the CT scan in December, 2023. PET scan in June was negative, but we cannot absolutely rule out metastatic disease since these lesions were too small to biopsy. We discussed the fact that the resolution of these lesions could represent a response to treatment, or may not have been malignant at all.  We think it is more likely the former, and so I recommend continuation of therapy.  We have had several discussions about this. His latest scan from January 2025 shows resolution of a multitude of pulmonary nodules. We will continue the maintenance nivolumab . CT chest, abdomen, and pelvis done on 03/06/2024 which revealed no significant interval change, no developing new mass lesion, fluid collection or lymph node enlargement, and a few tiny lung nodules are essentially stable with no increasing or dominant lung nodule.  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 He then had this area resected and pathology  revealed residual squamous cell carcinoma with clear margins and he is healing well.   Spinal stenosis at L4-5 He has had surgical decompression and resection of a synovial cyst in June 2023. His back pain seems to be acting up again.    Leukocytosis He has had chronic leukocytosis and we did not find an explanation but the [facility] continue to fluctuate up and down. His WBC was normal last month for the first time in years but is now elevated again. We will just monitor this.   Lymphedema of the Left Lower Extremity We discussed this diagnosis and the limited approaches available. This is improved.  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    Plan: His day 1 cycle 29 of Nivolumab  is scheduled on 05/09/2024. He has a WBC of 9.9, hemoglobin of 14.1, and platelet count of 232,000. His CMP is normal other than an chronic elevated BUN of 30 up from 24. His TSH and T4 levels are pending. I reminded him to increase his fluid intake. I will see him back in 4 weeks with CBC, CMP, TSH, and T4. The patient understands the plans discussed today and is in agreement with them.  He knows to contact our office if he develops concerns prior to his next appointment.  I provided 12 minutes of face-to-face time during this encounter and > 50% was spent counseling as documented under my assessment and plan.   Wanda DEL Cornelius, MD   [facility] - A DEPT OF [facility] [address] [place] [address] Dept: [phone] Dept Fax: [phone]  No orders of the defined types were placed in this encounter.   CHIEF COMPLAINT:  CC: Malignant melanoma   Current Treatment:  Evaluation and immunotherapy   HISTORY OF PRESENT ILLNESS:  [name] is a 78 y.o. male with a history of malignant melanoma who is referred in consultation with Dr. Tracey Bathe for assessment and management.  He had a  lesion of the dorsum of the left foot in September 2022 which appeared to be a granuloma and was treated in the office.  He later developed a another lesion lateral to this on the dorsum of the left foot which was not hyperpigmented but did bleed easily.  He also has several lesions of his left medial calf which are nodular and span approximately 6 cm.  He has had some increased edema of the left lower leg.  He was evaluated by dermatology on June 5, who felt this could be a squamous cell carcinoma versus tinea or a drug reaction.  He had biopsies done of both the foot lesion and the Lesion.  Pathology came back that the left dorsal foot shave biopsy revealed a malignant melanoma possibly a primary nodular ulcerated melanoma which had a Breslow thickness of at least 1.6 mm and a Clark's level 4.  Margins are positive as this was a shave biopsy.  Ulceration is present but no satellitosis.  The mitotic index is 4/mm and no lymphovascular invasion was identified.  No brisk tumor infiltrating lymphocytes are seen and tumor regression is absent.  This is felt to be at least a T2b lesion and wide excision was recommended.  The lesion of the left medial calf revealed a dermal map melanoma most consistent with metastatic disease. BRAF and PDL1 are positive.  This information was not available when the patient was admitted for back surgery on June 8 and he had a microlumbar decompression at L4-5 for spinal stenosis.  He was also found to have a synovial cyst at that time which was resected and benign.  He was readmitted several days later for acute pain of the right elbow and both arms.  Aspiration was obtained and this was diagnosed as gouty arthritis.  He did have temporary rehab after his back surgery.  His left leg was evaluated for deep venous thrombosis and was negative.  While he was recuperating from his surgery he had an x-ray which was abnormal which led to a CT scan which revealed at least 3 lung lesions.  MRI  of the brain was negative and he is now referred for management of the malignant melanoma. PET scan on 01/31/2023  revealed interval resolution of previously demonstrated hypermetabolic activity within the left foot, no evidence of local recurrence of metastatic disease, resolution of right middle lobe airspace diseases seen on most recent CT. No suspicious pulmonary nodularity, new opacification of the maxillary sinus with mild peripheral hypermetabolic activity, likely inflammatory, and aortic atherosclerosis.  CT chest, abdomen, and pelvis done on 09/07/2023 that revealed the previously present multitude of the right middle lobe pulmonary micro nodule and nodules have essentially resolved, with a 3 mm residual nodule likely represent sequela of infectious or inflammatory process and no evidence of metastatic disease to the thorax, abdomen, or pelvis.   Oncology History  Malignant melanoma, metastatic (HCC)  01/23/2022 Cancer Staging   Staging form: Melanoma of the Skin, AJCC 8th  Edition - Clinical stage from 01/23/2022: Stage III (cT2b, cN1c, cM0) - Signed by Cornelius Wanda DEL, MD on 04/07/2022 Histopathologic type: Malignant melanoma, NOS (except juvenile melanoma M-8770/[id number]) Stage prefix: Initial diagnosis Laterality: Left Lymph-vascular invasion (LVI): LVI not present (absent)/not identified Diagnostic confirmation: Positive histology Specimen type: Biopsy / Limited Resection Staged by: Managing physician Mitotic count: 4 Mitotic unit: mm2 Clark's level: Level IV Tumor-infiltrating lymphocytes: Present and non-brisk Breslow depth (mm): 16 Ulceration of the epidermis: Yes Microsatellites: No Primary tumor regression: Absent Intransit/satellite metastasis: In-transit Lymph node clinically or radiologically detected: No Microscopic confirmation of metastasis: No Sentinel lymph node biopsy performed: No Completion or therapeutic lymph node dissection performed: No Matted nodes: No Stage  used in treatment planning: Yes National guidelines used in treatment planning: Yes Type of national guideline used in treatment planning: NCCN   01/31/2022 Initial Diagnosis   Malignant melanoma, metastatic (HCC)   04/14/2022 -  Chemotherapy   Patient is on Treatment Plan : MELANOMA Nivolumab  (480) q28d (changed from q14d on 12/4)     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)  02/28/2022 Cancer Staging   Staging form: Melanoma of the Skin, AJCC 8th Edition - Clinical stage from 02/28/2022: Stage IV (cT2b(m), cN1c, cM1a) - Signed by Cornelius Wanda DEL, MD on 04/07/2022 Histopathologic type: Malignant melanoma, NOS (except juvenile melanoma M-8770/[id number]) Stage prefix: Initial diagnosis Laterality: Left Multiple tumors: Yes Lymph-vascular invasion (LVI): LVI present/identified, NOS Diagnostic confirmation: Positive histology Specimen type: Excision Staged by: Managing physician Intransit/satellite metastasis: Both in-transit and satellite Prognostic indicators: 02/28/22:  Wide excision of primary of foot - no residual melanoma,  Excision of skin of calf consistent with intransit mets of dermal/subcuticular   Melanoma with LVI, metastatic disease with multifocal lesions and multifocal positive margins 03/24/22: Re-excision with a few atypical melanocytes, no melanoma and clear margins   04/07/2022 Initial Diagnosis   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 He also had pulmonary nodules which have resolved so we are not sure whether they were metastases or not. He is therefore on maintenance immunotherapy with nivolumab . Patient states that he feels well but complains of back pain rating 6/10 that is radiating into  his left leg. His day 1 cycle 29 of Nivolumab  is scheduled on 05/09/2024. He has a WBC of 9.9, hemoglobin of 14.1, and platelet count of 232,000. His CMP is normal other than an chronic elevated BUN of 30 up from 24. His TSH and T4 levels are pending. I reminded him to increase his fluid intake. I will see him back in 4 weeks with CBC, CMP, TSH, and T4.  He denies fever, chills, night sweats, or other signs of infection. He denies cardiorespiratory and gastrointestinal issues. His appetite is very good and His weight has decreased 3 pounds over last 4 weeks.  REVIEW OF SYSTEMS:  Review of Systems  Constitutional: Negative.  Negative for appetite change, chills, diaphoresis, fatigue, fever and unexpected weight change.  HENT:  Negative.  Negative for hearing loss, lump/mass, mouth sores, nosebleeds, sore throat, tinnitus, trouble swallowing and voice change.   Eyes: Negative.  Negative for eye problems and icterus.  Respiratory:  Negative for chest tightness, cough, hemoptysis, shortness of breath and  wheezing.   Cardiovascular: Negative.  Negative for chest pain, leg swelling and palpitations.  Gastrointestinal:  Positive for diarrhea (on and off). Negative for abdominal distention, abdominal pain, blood in stool, constipation, nausea, rectal pain and vomiting.  Endocrine: Negative.  Negative for hot flashes.  Genitourinary: Negative.  Negative for bladder incontinence, difficulty urinating, dyspareunia, dysuria, frequency, hematuria, nocturia, pelvic pain and penile discharge.   Musculoskeletal:  Positive for back pain (chronic, intermittent, 6/10). Negative for arthralgias, flank pain, gait problem, myalgias, neck pain and neck stiffness.       Occasional left shoulder pain   Skin: Negative.  Negative for itching, rash and wound.  Neurological: Negative.  Negative for dizziness, extremity weakness, gait problem, headaches, light-headedness, numbness, seizures and speech difficulty.  Hematological:  Negative.  Negative for adenopathy. Does not bruise/bleed easily.  Psychiatric/Behavioral: Negative.  Negative for confusion, decreased concentration, depression, sleep disturbance and suicidal ideas. The patient is not nervous/anxious.    VITALS:  Blood pressure 136/73, pulse 66, temperature 98.1 F (36.7 C), temperature source Oral, resp. rate 16, height 5' 9.39 (1.763 m), weight 230 lb 8 oz (104.6 kg), SpO2 96%.  Wt Readings from Last 3 Encounters:  05/09/24 230 lb 8 oz (104.6 kg)  04/09/24 233 lb (105.7 kg)  03/12/24 229 lb 6.4 oz (104.1 kg)    Body mass index is 33.66 kg/m.  Performance status (ECOG): 1 - Symptomatic but completely ambulatory  PHYSICAL EXAM:  Physical Exam Vitals and nursing note reviewed.  Constitutional:      General: He is not in acute distress.    Appearance: Normal appearance. He is normal weight. He is not ill-appearing, toxic-appearing or diaphoretic.  HENT:     Head: Normocephalic and atraumatic.     Right Ear: Tympanic membrane, ear canal and external ear normal. There is no impacted cerumen.     Left Ear: Tympanic membrane, ear canal and external ear normal. There is no impacted cerumen.     Nose: Nose normal. No congestion or rhinorrhea.     Mouth/Throat:     Mouth: Mucous membranes are moist.     Pharynx: Oropharynx is clear. No oropharyngeal exudate or posterior oropharyngeal erythema.  Eyes:     General: No scleral icterus.       Right eye: No discharge.        Left eye: No discharge.     Extraocular Movements: Extraocular movements intact.     Conjunctiva/sclera: Conjunctivae normal.     Pupils: Pupils are equal, round, and reactive to light.  Cardiovascular:     Rate and Rhythm: Normal rate and regular rhythm.     Pulses: Normal pulses.     Heart sounds: Normal heart sounds. No murmur heard.    No friction rub. No gallop.  Pulmonary:     Effort: Pulmonary effort is normal. No respiratory distress.     Breath sounds: No stridor. No  wheezing, rhonchi or rales.  Chest:     Chest wall: No tenderness.     Comments: Healing incision in the upper sternal area Abdominal:     General: Bowel sounds are normal. There is no distension.     Palpations: Abdomen is soft. There is no hepatomegaly, splenomegaly or mass.     Tenderness: There is no abdominal tenderness. There is no right CVA tenderness, left CVA tenderness, guarding or rebound.     Hernia: No hernia is present.  Musculoskeletal:        General: Normal range of motion.  Cervical back: Normal range of motion and neck supple. No tenderness.     Right lower leg: No edema.     Left lower leg: 1+ Edema present.  Lymphadenopathy:     Cervical: No cervical adenopathy.     Upper Body:     Right upper body: No supraclavicular or axillary adenopathy.     Left upper body: No supraclavicular or axillary adenopathy.     Lower Body: No right inguinal adenopathy. No left inguinal adenopathy.  Skin:    General: Skin is warm and dry.     Coloration: Skin is not jaundiced.     Findings: No rash. Scaling rash: Scattered benign appearing nevi.     Comments: Large healing scar of the left medial knee without evidence of recurrence. Vitiligo of the skin of the left lower extremity  Tiny eschar in the mid tibia which is healing well  Neurological:     General: No focal deficit present.     Mental Status: He is alert and oriented to person, place, and time. Mental status is at baseline.     Cranial Nerves: No cranial nerve deficit.     Sensory: No sensory deficit.     Motor: No weakness.     Coordination: Coordination normal.     Gait: Gait normal.     Deep Tendon Reflexes: Reflexes normal.  Psychiatric:        Mood and Affect: Mood normal.        Behavior: Behavior normal.        Thought Content: Thought content normal.        Judgment: Judgment normal.    LABS:      Latest Ref Rng & Units 05/09/2024    1:24 PM 04/09/2024   12:49 PM 03/12/2024   12:56 PM  CBC  WBC  4.0 - 10.5 K/uL 9.9  11.0  10.5   Hemoglobin 13.0 - 17.0 g/dL 85.8  87.0  86.0   Hematocrit 39.0 - 52.0 % 42.0  39.1  41.6   Platelets 150 - 400 K/uL 232  240  220       Latest Ref Rng & Units 05/09/2024    1:24 PM 04/09/2024   12:49 PM 03/12/2024   12:56 PM  CMP  Glucose 70 - 99 mg/dL 95  99  99   BUN 8 - 23 mg/dL 30  24  27    Creatinine 0.61 - 1.24 mg/dL 8.81  8.93  8.77   Sodium 135 - 145 mmol/L 141  141  140   Potassium 3.5 - 5.1 mmol/L 4.1  4.3  4.0   Chloride 98 - 111 mmol/L 105  106  104   CO2 22 - 32 mmol/L 24  25  24    Calcium 8.9 - 10.3 mg/dL 9.0  9.4  9.3   Total Protein 6.5 - 8.1 g/dL 6.9  6.6  7.1   Total Bilirubin 0.0 - 1.2 mg/dL 0.8  0.6  0.7   Alkaline Phos 38 - 126 U/L 85  87  88   AST 15 - 41 U/L 20  22  22    ALT 0 - 44 U/L 18  25  23     Lab Results  Component Value Date   LDH 140 02/01/2023   LDH 160 02/07/2022   Lab Results  Component Value Date   TSH 1.380 05/09/2024   T4TOTAL 7.1 05/09/2024   STUDIES:  EXAM: 03/06/2024 CT CHEST, ABDOMEN, AND PELVIS WITH CONTRAST IMPRESSION: Overall no  significant interval change. No developing new mass lesion, fluid collection or lymph node enlargement. Few tiny lung nodules are essentially stable when adjusted for technique. No increasing or dominant lung nodule. No bowel obstruction, free air or free fluid.  Scattered stool. Bilateral Bosniak 1 renal cysts.  EXAM: 01/16/2024 CHEST - 2 VIEW IMPRESSION: No active cardiopulmonary disease.  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 , 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     Past Surgical History:  Procedure Laterality Date   LUMBAR LAMINECTOMY/DECOMPRESSION MICRODISCECTOMY N/A 01/26/2022   Procedure: Microlumbar decompression Lumbar four-five, lateral mass fusion with autograft and allograft bone;  Surgeon: Duwayne Purchase, MD;  Location: [facility] OR;  Service: Orthopedics;  Laterality: N/A;   PARTIAL GASTRECTOMY     TOTAL HIP ARTHROPLASTY Right 06/18/2018   Procedure: RIGHT TOTAL HIP ARTHROPLASTY ANTERIOR APPROACH;  Surgeon: Ernie Cough, MD;  Location: [facility] ORS;  Service: Orthopedics;  Laterality: Right;    Family History  Problem Relation Age of Onset   Lung cancer Sister    Melanoma Brother     Social History:  reports that he has quit smoking. He has never used smokeless tobacco. He reports that he does not currently use alcohol. He reports that he does not use drugs.The patient is alone today.  Allergies:  Allergies  Allergen Reactions   Morphine Itching    Patient states it is tolerable itching    Current Medications: Current Outpatient Medications  Medication Sig Dispense Refill   amLODipine  (NORVASC ) 5 MG tablet Take 5 mg by mouth daily.     Calcium Carbonate (CALCIUM 600 PO) Take 1 tablet by mouth 2 (two) times daily.     clopidogrel  (PLAVIX ) 75 MG tablet Take 1 tablet by mouth daily.     docusate sodium  (COLACE) 100 MG capsule 1 capsule 2 TIMES DAILY (route: oral)     famotidine  (PEPCID ) 40 MG tablet TAKE 1 TABLET EVERY DAY 90 tablet 3   fluconazole  (DIFLUCAN ) 200 MG tablet Take 1 tablet (200 mg total) by mouth daily. 10 tablet 1   folic acid  (FOLVITE ) 400 MCG tablet Take 400 mcg by mouth daily.     gabapentin  (NEURONTIN ) 300 MG capsule Take 1 capsule (300 mg total) by mouth 3 (three) times daily. 270 capsule 3   hydrochlorothiazide  (HYDRODIURIL ) 12.5 MG tablet Take 12.5  mg by mouth daily.     lisinopril  (ZESTRIL ) 30 MG tablet Take 30 mg by mouth daily.     Multiple Vitamin (MULTIVITAMIN WITH MINERALS) TABS tablet Take 1 tablet by mouth in the morning.     mupirocin ointment (BACTROBAN) 2 % Apply topically.     neomycin -polymyxin-hydrocortisone (CORTISPORIN) OTIC solution Place 3 drops into the right ear 4 (four) times daily. 10 mL 1   nystatin  (MYCOSTATIN /NYSTOP ) powder Apply 1 Application topically 3 (three) times daily. 15 g  0   Omega-3 1000 MG CAPS Take by mouth.     ondansetron  (ZOFRAN ) 4 MG tablet Take 1 tablet (4 mg total) by mouth every 4 (four) hours as needed for nausea. 90 tablet 3   oxyCODONE  (OXY IR/ROXICODONE ) 5 MG immediate release tablet Take 1 tablet (5 mg total) by mouth every 6 (six) hours as needed for severe pain. (Patient not taking: Reported on 07/27/2023) 30 tablet 0   polyethylene glycol (MIRALAX  / GLYCOLAX ) 17 g packet Take 17 g by mouth daily. 14 each 0   pravastatin  (PRAVACHOL ) 40 MG tablet Take 40 mg by mouth daily.     prochlorperazine  (COMPAZINE ) 10 MG tablet Take 1 tablet (10 mg total) by mouth every 6 (six) hours as needed for nausea or vomiting. 90 tablet 3   traZODone  (DESYREL ) 50 MG tablet TAKE 1 TABLET AT BEDTIME AS NEEDED FOR SLEEP 90 tablet 3   triamcinolone  cream (KENALOG ) 0.1 % Apply topically 2 (two) times daily.     No current facility-administered medications for this visit.    I,Jasmine M Lassiter,acting as a scribe for [name], MD.,have documented all relevant documentation on the behalf of [name], MD,as directed by  [name], MD while in the presence of [name], MD.

## 2024-05-09 NOTE — Telephone Encounter (Signed)
 Patient has been scheduled for follow-up visit per 05/09/24 LOS.  Pt given an appt calendar with date and time.

## 2024-05-09 NOTE — Patient Instructions (Signed)
 [facility] [place] - A DEPT OF [facility]  Discharge Instructions: Thank you for choosing [facility] to provide your oncology and hematology care.  If you have a lab appointment with the Cancer Center, please go directly to the Cancer Center and check in at the registration area.   Wear comfortable clothing and clothing appropriate for easy access to any Portacath or PICC line.   We strive to give you quality time with your provider. You may need to reschedule your appointment if you arrive late (15 or more minutes).  Arriving late affects you and other patients whose appointments are after yours.  Also, if you miss three or more appointments without notifying the office, you may be dismissed from the clinic at the provider's discretion.      For prescription refill requests, have your pharmacy contact our office and allow 72 hours for refills to be completed.    Today you received the following chemotherapy and/or immunotherapy agents Opdivo      To help prevent nausea and vomiting after your treatment, we encourage you to take your nausea medication as directed.  BELOW ARE SYMPTOMS THAT SHOULD BE REPORTED IMMEDIATELY: *FEVER GREATER THAN 100.4 F (38 C) OR HIGHER *CHILLS OR SWEATING *NAUSEA AND VOMITING THAT IS NOT CONTROLLED WITH YOUR NAUSEA MEDICATION *UNUSUAL SHORTNESS OF BREATH *UNUSUAL BRUISING OR BLEEDING *URINARY PROBLEMS (pain or burning when urinating, or frequent urination) *BOWEL PROBLEMS (unusual diarrhea, constipation, pain near the anus) TENDERNESS IN MOUTH AND THROAT WITH OR WITHOUT PRESENCE OF ULCERS (sore throat, sores in mouth, or a toothache) UNUSUAL RASH, SWELLING OR PAIN  UNUSUAL VAGINAL DISCHARGE OR ITCHING   Items with * indicate a potential emergency and should be followed up as soon as possible or go to the Emergency Department if any problems should occur.  Please show the CHEMOTHERAPY ALERT CARD or IMMUNOTHERAPY ALERT  CARD at check-in to the Emergency Department and triage nurse.  Should you have questions after your visit or need to cancel or reschedule your appointment, please contact [facility] [place] - [facility]  Dept: [phone]  and follow the prompts.  Office hours are 8:00 a.m. to 4:30 p.m. Monday - Friday. Please note that voicemails left after 4:00 p.m. may not be returned until the following business day.  We are closed weekends and major holidays. You have access to a nurse at all times for urgent questions. Please call the main number to the clinic Dept: [phone] and follow the prompts.  For any non-urgent questions, you may also contact your provider using MyChart. We now offer e-Visits for anyone 58 and older to request care online for non-urgent symptoms. For details visit [email or web address].   Also download the MyChart app! Go to the app store, search "MyChart", open the app, select [place], and log in with your MyChart username and password.

## 2024-05-10 ENCOUNTER — Other Ambulatory Visit: Payer: Self-pay

## 2024-05-10 LAB — T4: T4, Total: 7.1 ug/dL (ref 4.5–12.0)

## 2024-05-11 ENCOUNTER — Other Ambulatory Visit: Payer: Self-pay | Admitting: Oncology

## 2024-05-11 DIAGNOSIS — K219 Gastro-esophageal reflux disease without esophagitis: Secondary | ICD-10-CM

## 2024-05-20 ENCOUNTER — Encounter: Payer: Self-pay | Admitting: Oncology

## 2024-05-21 DIAGNOSIS — M545 Low back pain, unspecified: Secondary | ICD-10-CM | POA: Diagnosis not present

## 2024-05-21 DIAGNOSIS — M1612 Unilateral primary osteoarthritis, left hip: Secondary | ICD-10-CM | POA: Diagnosis not present

## 2024-05-23 ENCOUNTER — Telehealth: Payer: Self-pay

## 2024-05-23 NOTE — Telephone Encounter (Signed)
 Notified pt that he gets Nivolumab  480mg  every 28 days. He states he is setting up appt for epidural for back pain and they needed name of medication.

## 2024-05-23 NOTE — Telephone Encounter (Signed)
 Shelly from [facility] is calling for Dr Charlie Dolores. He would like to know the name of the medication pt takes here. Pt is scheduled for epidural steroid injection.

## 2024-05-27 ENCOUNTER — Other Ambulatory Visit: Payer: Self-pay

## 2024-06-02 DIAGNOSIS — L814 Other melanin hyperpigmentation: Secondary | ICD-10-CM | POA: Diagnosis not present

## 2024-06-02 DIAGNOSIS — L821 Other seborrheic keratosis: Secondary | ICD-10-CM | POA: Diagnosis not present

## 2024-06-02 DIAGNOSIS — L570 Actinic keratosis: Secondary | ICD-10-CM | POA: Diagnosis not present

## 2024-06-02 DIAGNOSIS — Z85820 Personal history of malignant melanoma of skin: Secondary | ICD-10-CM | POA: Diagnosis not present

## 2024-06-02 DIAGNOSIS — D225 Melanocytic nevi of trunk: Secondary | ICD-10-CM | POA: Diagnosis not present

## 2024-06-06 ENCOUNTER — Inpatient Hospital Stay: Attending: Oncology

## 2024-06-06 ENCOUNTER — Encounter: Payer: Self-pay | Admitting: Oncology

## 2024-06-06 ENCOUNTER — Ambulatory Visit

## 2024-06-06 ENCOUNTER — Other Ambulatory Visit

## 2024-06-06 ENCOUNTER — Inpatient Hospital Stay: Admitting: Oncology

## 2024-06-06 ENCOUNTER — Telehealth: Payer: Self-pay | Admitting: Oncology

## 2024-06-06 ENCOUNTER — Inpatient Hospital Stay

## 2024-06-06 ENCOUNTER — Ambulatory Visit: Admitting: Oncology

## 2024-06-06 VITALS — BP 132/75 | HR 76 | Temp 97.9°F | Resp 16 | Ht 69.39 in | Wt 231.3 lb

## 2024-06-06 DIAGNOSIS — C792 Secondary malignant neoplasm of skin: Secondary | ICD-10-CM | POA: Diagnosis not present

## 2024-06-06 DIAGNOSIS — C439 Malignant melanoma of skin, unspecified: Secondary | ICD-10-CM

## 2024-06-06 DIAGNOSIS — C4372 Malignant melanoma of left lower limb, including hip: Secondary | ICD-10-CM | POA: Diagnosis not present

## 2024-06-06 DIAGNOSIS — Z79620 Long term (current) use of immunosuppressive biologic: Secondary | ICD-10-CM | POA: Insufficient documentation

## 2024-06-06 DIAGNOSIS — Z23 Encounter for immunization: Secondary | ICD-10-CM | POA: Insufficient documentation

## 2024-06-06 DIAGNOSIS — Z5112 Encounter for antineoplastic immunotherapy: Secondary | ICD-10-CM | POA: Diagnosis not present

## 2024-06-06 LAB — CBC WITH DIFFERENTIAL (CANCER CENTER ONLY)
Abs Immature Granulocytes: 0.04 K/uL (ref 0.00–0.07)
Basophils Absolute: 0 K/uL (ref 0.0–0.1)
Basophils Relative: 0 %
Eosinophils Absolute: 0.4 K/uL (ref 0.0–0.5)
Eosinophils Relative: 4 %
HCT: 42.6 % (ref 39.0–52.0)
Hemoglobin: 14.4 g/dL (ref 13.0–17.0)
Immature Granulocytes: 0 %
Lymphocytes Relative: 26 %
Lymphs Abs: 2.8 K/uL (ref 0.7–4.0)
MCH: 29.5 pg (ref 26.0–34.0)
MCHC: 33.8 g/dL (ref 30.0–36.0)
MCV: 87.3 fL (ref 80.0–100.0)
Monocytes Absolute: 0.9 K/uL (ref 0.1–1.0)
Monocytes Relative: 9 %
Neutro Abs: 6.5 K/uL (ref 1.7–7.7)
Neutrophils Relative %: 61 %
Platelet Count: 224 K/uL (ref 150–400)
RBC: 4.88 MIL/uL (ref 4.22–5.81)
RDW: 12.6 % (ref 11.5–15.5)
WBC Count: 10.7 K/uL — ABNORMAL HIGH (ref 4.0–10.5)
nRBC: 0 % (ref 0.0–0.2)

## 2024-06-06 LAB — CMP (CANCER CENTER ONLY)
ALT: 29 U/L (ref 0–44)
AST: 25 U/L (ref 15–41)
Albumin: 4.5 g/dL (ref 3.5–5.0)
Alkaline Phosphatase: 86 U/L (ref 38–126)
Anion gap: 10 (ref 5–15)
BUN: 22 mg/dL (ref 8–23)
CO2: 26 mmol/L (ref 22–32)
Calcium: 9.5 mg/dL (ref 8.9–10.3)
Chloride: 102 mmol/L (ref 98–111)
Creatinine: 1.24 mg/dL (ref 0.61–1.24)
GFR, Estimated: 60 mL/min — ABNORMAL LOW (ref >60)
Glucose, Bld: 112 mg/dL — ABNORMAL HIGH (ref 70–99)
Potassium: 4.3 mmol/L (ref 3.5–5.1)
Sodium: 138 mmol/L (ref 135–145)
Total Bilirubin: 1 mg/dL (ref 0.0–1.2)
Total Protein: 7 g/dL (ref 6.5–8.1)

## 2024-06-06 LAB — TSH: TSH: 1.14 u[IU]/mL (ref 0.350–4.500)

## 2024-06-06 MED ORDER — SODIUM CHLORIDE 0.9% FLUSH: 10.0000 mL | INTRAVENOUS | Status: DC | PRN

## 2024-06-06 MED ORDER — SODIUM CHLORIDE 0.9 % IV SOLN
480.0000 mg | Freq: Once | INTRAVENOUS | Status: AC
  Administered 2024-06-06: 480 mg via INTRAVENOUS
  Filled 2024-06-06: qty 48

## 2024-06-06 MED ORDER — INFLUENZA VAC SPLIT HIGH-DOSE 0.5 ML IM SUSY
0.5000 mL | PREFILLED_SYRINGE | Freq: Once | INTRAMUSCULAR | Status: AC
  Administered 2024-06-06: 0.5 mL via INTRAMUSCULAR
  Filled 2024-06-06: qty 0.5

## 2024-06-06 MED ORDER — SODIUM CHLORIDE 0.9 % IV SOLN: Freq: Once | INTRAVENOUS | Status: AC

## 2024-06-06 NOTE — Progress Notes (Signed)
 [facility]  [address] [place],  [place]  [address] [phone]  Clinic Day: 06/06/24  Referring physician: Fernand Money, MD  ASSESSMENT & PLAN:  Assessment: Malignant melanoma of the left foot He had a punch biopsy and this revealed a Breslow thickness of 1.6 mm and Clark's level 4 with ulceration for a T2b N0 M0 clinically.  We found no distant metastasis on PET scan, but he did have metastasis to the left lower extremity.  His tumor is positive for BRAF and PDL1 mutation. He had this treated with wide local excision.   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 He is now on maintenance Nivolumab .  Multiple lesions of the lung These were suspicious for metastatic disease but resolved on the CT scan in December, 2023. PET scan in June was negative, but we cannot absolutely rule out metastatic disease since these lesions were too small to biopsy. We discussed the fact that the resolution of these lesions could represent a response to treatment, or may not have been malignant at all.  We think it is more likely the former, and so I recommend continuation of therapy.  We have had several discussions about this. His latest scan from January 2025 shows resolution of a multitude of pulmonary nodules. We will continue the maintenance nivolumab . CT chest, abdomen, and pelvis done on 03/06/2024 which revealed no significant interval change, no developing new mass lesion, fluid collection or lymph node enlargement, and a few tiny lung nodules are essentially stable with no increasing or dominant lung nodule.  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   Spinal stenosis at L4-5 He has had surgical decompression and resection of a synovial cyst in June 2023. His back pain seems to be acting up again.    Leukocytosis He has had chronic leukocytosis and we did not find an explanation but the [facility] continue to fluctuate up and down. His WBC was normal last month for the first time in years but is now elevated again. We will just monitor this.   Lymphedema of the Left Lower Extremity We discussed this diagnosis and the limited approaches available. This is improved.  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    Plan: He informed me that he visited [name] earlier this month regarding his back pain, but he was not sure if the pain was actually related to osteoarthritis of the left hip. He was referred to visit [name] on 07/10/2024. Patient states that he will receive an epidural steroid injection on 06/24/2024 and will hold his blood thinner for 5 days before this. His day 1, cycle 30 of nivolumab  is scheduled for 06/06/2024. He has a an elevated of WBC of 10.7 with an ANC of 6500 up from 9.9 with an ANC of 6600, hemoglobin of 14.4, and platelet count of 224,000. His CMP is completely normal. His TSH and T4 are pending. I will see him back in 4 weeks with CBC, CMP, TSH, and T4. We will plan  scans in January. The patient understands the plans discussed today and is in agreement with them.  He knows to contact our office if he develops concerns prior to his next appointment.  I provided 14 minutes of face-to-face time during this encounter and > 50% was spent counseling as documented under my assessment and plan.   Benjamin VEAR Cornish, MD  [facility] - A DEPT OF [facility] [address] [place] [address] Dept: [phone] Dept Fax:  [phone]  No orders of the defined types were placed in this encounter.   CHIEF COMPLAINT:  CC: Malignant melanoma   Current Treatment:  Evaluation and immunotherapy   HISTORY OF PRESENT ILLNESS:  [name] is a 78 y.o. male with a history of malignant melanoma who is referred in consultation with [name] for assessment and management.  He had a lesion of the dorsum of the left foot in September 2022 which appeared to be a granuloma and was treated in the office.  He later developed a another lesion lateral to this on the dorsum of the left foot which was not hyperpigmented but did bleed easily.  He also has several lesions of his left medial calf which are nodular and span approximately 6 cm.  He has had some increased edema of the left lower leg.  He was evaluated by dermatology on June 5, who felt this could be a squamous cell carcinoma versus tinea or a drug reaction.  He had biopsies done of both the foot lesion and the Lesion.  Pathology came back that the left dorsal foot shave biopsy revealed a malignant melanoma possibly a primary nodular ulcerated melanoma which had a Breslow thickness of at least 1.6 mm and a Clark's level 4.  Margins are positive as this was a shave biopsy.  Ulceration is present but no satellitosis.  The mitotic index is 4/mm and no lymphovascular invasion was identified.  No brisk tumor infiltrating lymphocytes are seen and tumor regression is absent.  This is felt to be at least a T2b lesion and wide excision was recommended.  The lesion of the left medial calf revealed a dermal map melanoma most consistent with metastatic disease. BRAF and PDL1 are positive.  This information was not available when the patient was admitted for back surgery on June 8 and he had a microlumbar decompression at L4-5 for spinal stenosis.  He was also found to have a synovial cyst at that time which was resected and benign.  He was readmitted several days later for acute pain of the  right elbow and both arms.  Aspiration was obtained and this was diagnosed as gouty arthritis.  He did have temporary rehab after his back surgery.  His left leg was evaluated for deep venous thrombosis and was negative.  While he was recuperating from his surgery he had an x-ray which was abnormal which led to a CT scan which revealed at least 3 lung lesions.  MRI of the brain was negative and he is now referred for management of the malignant melanoma. PET scan on 01/31/2023  revealed interval resolution of previously demonstrated hypermetabolic activity within the left foot, no evidence of local recurrence of metastatic disease, resolution of right middle lobe airspace diseases seen on most recent CT. No suspicious pulmonary nodularity, new opacification of the maxillary sinus with mild peripheral hypermetabolic activity, likely inflammatory, and aortic atherosclerosis.  CT chest, abdomen, and pelvis done on 09/07/2023  that revealed the previously present multitude of the right middle lobe pulmonary micro nodule and nodules have essentially resolved, with a 3 mm residual nodule likely represent sequela of infectious or inflammatory process and no evidence of metastatic disease to the thorax, abdomen, or pelvis.   Oncology History  Malignant melanoma, metastatic (HCC)  01/23/2022 Cancer Staging   Staging form: Melanoma of the Skin, AJCC 8th Edition - Clinical stage from 01/23/2022: Stage III (cT2b, cN1c, cM0) - Signed by Cornelius Benjamin DEL, MD on 04/07/2022 Histopathologic type: Malignant melanoma, NOS (except juvenile melanoma M-8770/[id number]) Stage prefix: Initial diagnosis Laterality: Left Lymph-vascular invasion (LVI): LVI not present (absent)/not identified Diagnostic confirmation: Positive histology Specimen type: Biopsy / Limited Resection Staged by: Managing physician Mitotic count: 4 Mitotic unit: mm2 Clark's level: Level IV Tumor-infiltrating lymphocytes: Present and non-brisk Breslow depth (mm):  16 Ulceration of the epidermis: Yes Microsatellites: No Primary tumor regression: Absent Intransit/satellite metastasis: In-transit Lymph node clinically or radiologically detected: No Microscopic confirmation of metastasis: No Sentinel lymph node biopsy performed: No Completion or therapeutic lymph node dissection performed: No Matted nodes: No Stage used in treatment planning: Yes National guidelines used in treatment planning: Yes Type of national guideline used in treatment planning: NCCN   01/31/2022 Initial Diagnosis   Malignant melanoma, metastatic (HCC)   04/14/2022 -  Chemotherapy   Patient is on Treatment Plan : MELANOMA Nivolumab  (480) q28d (changed from q14d on 12/4)     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)  02/28/2022 Cancer Staging   Staging form: Melanoma of the Skin, AJCC 8th Edition - Clinical stage from 02/28/2022: Stage IV (cT2b(m), cN1c, cM1a) - Signed by Cornelius Benjamin DEL, MD on 04/07/2022 Histopathologic type: Malignant melanoma, NOS (except juvenile melanoma M-8770/[id number]) Stage prefix: Initial diagnosis Laterality: Left Multiple tumors: Yes Lymph-vascular invasion (LVI): LVI present/identified, NOS Diagnostic confirmation: Positive histology Specimen type: Excision Staged by: Managing physician Intransit/satellite metastasis: Both in-transit and satellite Prognostic indicators: 02/28/22:  Wide excision of primary of foot - no residual melanoma,  Excision of skin of calf consistent with intransit mets of dermal/subcuticular   Melanoma with LVI, metastatic disease with multifocal lesions and multifocal positive margins 03/24/22: Re-excision with a few atypical melanocytes, no melanoma and clear margins   04/07/2022 Initial Diagnosis   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 He also had pulmonary nodules which have resolved so we are not sure whether they were metastases or not. He is therefore on maintenance immunotherapy with nivolumab . Patient states that he feels well and has no complaints of acute pain. He informed me that he visited [name] earlier this month regarding his back pain, but he was not sure if the pain was actually related to osteoarthritis of the left hip. He was referred to visit [name] on 07/10/2024. Patient states that he will receive an epidural steroid injection on 06/24/2024 and will hold his blood thinner for 5 days before this. His day 1, cycle 30 of nivolumab  is scheduled for 06/06/2024. He has a an elevated of WBC of 10.7 with an ANC of 6500 up from 9.9 with an ANC of 6600, hemoglobin of 14.4, and platelet count of 224,000. His CMP is completely normal.  His TSH and T4 are pending. I will see him back in 4 weeks with CBC, CMP, TSH, and T4. We will plan scans in January.  He denies fever, chills, night sweats, or other signs of infection. He denies cardiorespiratory and gastrointestinal issues. He  denies pain. His appetite is good and His weight has been stable.  REVIEW OF SYSTEMS:  Review of Systems  Constitutional: Negative.  Negative for appetite change, chills, diaphoresis, fatigue, fever and unexpected weight change.  HENT:  Negative.  Negative for hearing loss, lump/mass, mouth sores, nosebleeds, sore throat, tinnitus, trouble swallowing and voice change.   Eyes: Negative.  Negative for eye problems and icterus.  Respiratory:  Negative for chest tightness, cough, hemoptysis, shortness of breath and wheezing.   Cardiovascular: Negative.  Negative for chest pain, leg swelling and palpitations.  Gastrointestinal:  Positive for diarrhea (on and off). Negative for abdominal distention, abdominal pain, blood in stool, constipation,  nausea, rectal pain and vomiting.  Endocrine: Negative.  Negative for hot flashes.  Genitourinary: Negative.  Negative for bladder incontinence, difficulty urinating, dyspareunia, dysuria, frequency, hematuria, nocturia, pelvic pain and penile discharge.   Musculoskeletal:  Positive for back pain (chronic, intermittent, 6/10). Negative for arthralgias, flank pain, gait problem, myalgias, neck pain and neck stiffness.       Occasional left shoulder pain   Skin: Negative.  Negative for itching, rash and wound.  Neurological: Negative.  Negative for dizziness, extremity weakness, gait problem, headaches, light-headedness, numbness, seizures and speech difficulty.  Hematological: Negative.  Negative for adenopathy. Does not bruise/bleed easily.  Psychiatric/Behavioral: Negative.  Negative for confusion, decreased concentration, depression, sleep disturbance and suicidal ideas. The patient is not nervous/anxious.    VITALS:  Blood pressure 132/75, pulse 76, temperature 97.9 F (36.6 C), temperature source Oral, resp. rate 16, height 5' 9.39 (1.763 m), weight 231 lb 4.8 oz (104.9 kg), SpO2 96%.  Wt Readings from Last 3 Encounters:  06/06/24 231 lb 4.8 oz (104.9 kg)  05/09/24 230 lb 8 oz (104.6 kg)  04/09/24 233 lb (105.7 kg)    Body mass index is 33.77 kg/m.  Performance status (ECOG): 1 - Symptomatic but completely ambulatory  PHYSICAL EXAM:  Physical Exam Vitals and nursing note reviewed.  Constitutional:      General: He is not in acute distress.    Appearance: Normal appearance. He is normal weight. He is not ill-appearing, toxic-appearing or diaphoretic.  HENT:     Head: Normocephalic and atraumatic.     Right Ear: Tympanic membrane, ear canal and external ear normal. There is no impacted cerumen.     Left Ear: Tympanic membrane, ear canal and external ear normal. There is no impacted cerumen.     Nose: Nose normal. No congestion or rhinorrhea.     Mouth/Throat:     Mouth: Mucous  membranes are moist.     Pharynx: Oropharynx is clear. No oropharyngeal exudate or posterior oropharyngeal erythema.  Eyes:     General: No scleral icterus.       Right eye: No discharge.        Left eye: No discharge.     Extraocular Movements: Extraocular movements intact.     Conjunctiva/sclera: Conjunctivae normal.     Pupils: Pupils are equal, round, and reactive to light.  Cardiovascular:     Rate and Rhythm: Normal rate and regular rhythm.     Pulses: Normal pulses.     Heart sounds: Normal heart sounds. No murmur heard.    No friction  rub. No gallop.  Pulmonary:     Effort: Pulmonary effort is normal. No respiratory distress.     Breath sounds: No stridor. No wheezing, rhonchi or rales.  Chest:     Chest wall: No tenderness.  Abdominal:     General: Bowel sounds are normal. There is no distension.     Palpations: Abdomen is soft. There is no hepatomegaly, splenomegaly or mass.     Tenderness: There is no abdominal tenderness. There is no right CVA tenderness, left CVA tenderness, guarding or rebound.     Hernia: No hernia is present.  Musculoskeletal:        General: Normal range of motion.     Cervical back: Normal range of motion and neck supple. No tenderness.     Right lower leg: No edema.     Left lower leg: 1+ Edema present.     Left foot: Swelling present.  Lymphadenopathy:     Cervical: No cervical adenopathy.     Upper Body:     Right upper body: No supraclavicular or axillary adenopathy.     Left upper body: No supraclavicular or axillary adenopathy.     Lower Body: No right inguinal adenopathy. No left inguinal adenopathy.  Skin:    General: Skin is warm and dry.     Coloration: Skin is not jaundiced.     Findings: No rash.     Comments: Stable, well-healed scar tissue on the dorsum of his left foot with some mild edema.  Large, indurated scar in the medial aspect of the left knee which is well-healed with no evidence of recurrence. Vitiligo of the skin of  the left lower extremity, but also a few areas scattered on his bilateral upper extremities. Scattered benign appearing nevi  Neurological:     General: No focal deficit present.     Mental Status: He is alert and oriented to person, place, and time. Mental status is at baseline.     Cranial Nerves: No cranial nerve deficit.     Sensory: No sensory deficit.     Motor: No weakness.     Coordination: Coordination normal.     Gait: Gait normal.     Deep Tendon Reflexes: Reflexes normal.  Psychiatric:        Mood and Affect: Mood normal.        Behavior: Behavior normal.        Thought Content: Thought content normal.        Judgment: Judgment normal.    LABS:      Latest Ref Rng & Units 06/06/2024    1:52 PM 05/09/2024    1:24 PM 04/09/2024   12:49 PM  CBC  WBC 4.0 - 10.5 K/uL 10.7  9.9  11.0   Hemoglobin 13.0 - 17.0 g/dL 85.5  85.8  87.0   Hematocrit 39.0 - 52.0 % 42.6  42.0  39.1   Platelets 150 - 400 K/uL 224  232  240       Latest Ref Rng & Units 06/06/2024    1:52 PM 05/09/2024    1:24 PM 04/09/2024   12:49 PM  CMP  Glucose 70 - 99 mg/dL 887  95  99   BUN 8 - 23 mg/dL 22  30  24    Creatinine 0.61 - 1.24 mg/dL 8.75  8.81  8.93   Sodium 135 - 145 mmol/L 138  141  141   Potassium 3.5 - 5.1 mmol/L 4.3  4.1  4.3  Chloride 98 - 111 mmol/L 102  105  106   CO2 22 - 32 mmol/L 26  24  25    Calcium 8.9 - 10.3 mg/dL 9.5  9.0  9.4   Total Protein 6.5 - 8.1 g/dL 7.0  6.9  6.6   Total Bilirubin 0.0 - 1.2 mg/dL 1.0  0.8  0.6   Alkaline Phos 38 - 126 U/L 86  85  87   AST 15 - 41 U/L 25  20  22    ALT 0 - 44 U/L 29  18  25     Lab Results  Component Value Date   LDH 140 02/01/2023   LDH 160 02/07/2022   Lab Results  Component Value Date   TSH 1.140 06/06/2024   T4TOTAL 7.6 06/06/2024   STUDIES:  EXAM: 03/06/2024 CT CHEST, ABDOMEN, AND PELVIS WITH CONTRAST IMPRESSION: Overall no significant interval change. No developing new mass lesion, fluid collection or lymph node  enlargement. Few tiny lung nodules are essentially stable when adjusted for technique. No increasing or dominant lung nodule. No bowel obstruction, free air or free fluid.  Scattered stool. Bilateral Bosniak 1 renal cysts.  EXAM: 01/16/2024 CHEST - 2 VIEW IMPRESSION: No active cardiopulmonary disease.  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 , 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     Past Surgical History:  Procedure Laterality Date   LUMBAR LAMINECTOMY/DECOMPRESSION MICRODISCECTOMY N/A 01/26/2022   Procedure: Microlumbar decompression Lumbar four-five, lateral mass fusion with autograft and allograft bone;  Surgeon: Duwayne Purchase, MD;  Location: [facility] OR;  Service: Orthopedics;  Laterality: N/A;   PARTIAL GASTRECTOMY     TOTAL HIP ARTHROPLASTY Right 06/18/2018   Procedure: RIGHT TOTAL HIP ARTHROPLASTY ANTERIOR APPROACH;  Surgeon: [name] Cough, MD;  Location: [facility] ORS;  Service: Orthopedics;  Laterality: Right;    Family History  Problem Relation Age of Onset   Lung cancer Sister    Melanoma Brother     Social History:  reports that he has quit smoking. He has never used smokeless tobacco. He reports that he does not currently use alcohol. He reports that he does not use drugs.The patient is alone today.  Allergies:  Allergies  Allergen Reactions   Morphine Itching    Patient states it is tolerable itching     Current Medications: Current Outpatient Medications  Medication Sig Dispense Refill   amLODipine  (NORVASC ) 5 MG tablet Take 5 mg by mouth daily.     Calcium Carbonate (CALCIUM 600 PO) Take 1 tablet by mouth 2 (two) times daily.     clopidogrel  (PLAVIX ) 75 MG tablet Take 1 tablet by mouth daily.     docusate sodium  (COLACE) 100 MG capsule 1 capsule 2 TIMES DAILY (route: oral)     famotidine  (PEPCID ) 40 MG tablet TAKE 1 TABLET EVERY DAY 90 tablet 3  folic acid  (FOLVITE ) 400 MCG tablet Take 400 mcg by mouth daily.     gabapentin  (NEURONTIN ) 300 MG capsule Take 1 capsule (300 mg total) by mouth 3 (three) times daily. 270 capsule 3   hydrochlorothiazide  (HYDRODIURIL ) 12.5 MG tablet Take 12.5 mg by mouth daily.     lisinopril  (ZESTRIL ) 30 MG tablet Take 30 mg by mouth daily.     Multiple Vitamin (MULTIVITAMIN WITH MINERALS) TABS tablet Take 1 tablet by mouth in the morning.     neomycin -polymyxin-hydrocortisone (CORTISPORIN) OTIC solution Place 3 drops into the right ear 4 (four) times daily. 10 mL 1   nystatin  (MYCOSTATIN /NYSTOP ) powder Apply 1 Application topically 3 (three) times daily. 15 g 0   Omega-3 1000 MG CAPS Take by mouth.     ondansetron  (ZOFRAN ) 4 MG tablet Take 1 tablet (4 mg total) by mouth every 4 (four) hours as needed for nausea. 90 tablet 3   polyethylene glycol (MIRALAX  / GLYCOLAX ) 17 g packet Take 17 g by mouth daily. 14 each 0   pravastatin  (PRAVACHOL ) 40 MG tablet Take 40 mg by mouth daily.     prochlorperazine  (COMPAZINE ) 10 MG tablet Take 1 tablet (10 mg total) by mouth every 6 (six) hours as needed for nausea or vomiting. 90 tablet 3   traZODone  (DESYREL ) 50 MG tablet TAKE 1 TABLET AT BEDTIME AS NEEDED FOR SLEEP 90 tablet 3   triamcinolone  cream (KENALOG ) 0.1 % Apply topically 2 (two) times daily.     No current facility-administered medications for this visit.    I,Zulma Court H Gust Eugene,acting as a scribe for Benjamin VEAR Cornish, MD.,have documented all relevant  documentation on the behalf of Benjamin VEAR Cornish, MD,as directed by  Benjamin VEAR Cornish, MD while in the presence of Benjamin VEAR Cornish, MD.  I have reviewed this report as typed by the medical scribe, and it is complete and accurate.

## 2024-06-06 NOTE — Patient Instructions (Signed)
 Nivolumab Injection What is this medication? NIVOLUMAB (nye VOL ue mab) treats some types of cancer. It works by helping your immune system slow or stop the spread of cancer cells. It is a monoclonal antibody. This medicine may be used for other purposes; ask your health care provider or pharmacist if you have questions. COMMON BRAND NAME(S): Opdivo What should I tell my care team before I take this medication? They need to know if you have any of these conditions: Allogeneic stem cell transplant (uses someone else's stem cells) Autoimmune diseases, such as Crohn disease, ulcerative colitis, lupus History of chest radiation Nervous system problems, such as Guillain-Barre syndrome or myasthenia gravis Organ transplant An unusual or allergic reaction to nivolumab, other medications, foods, dyes, or preservatives Pregnant or trying to get pregnant Breast-feeding How should I use this medication? This medication is infused into a vein. It is given in a hospital or clinic setting. A special MedGuide will be given to you before each treatment. Be sure to read this information carefully each time. Talk to your care team about the use of this medication in children. While it may be prescribed for children as young as 12 years for selected conditions, precautions do apply. Overdosage: If you think you have taken too much of this medicine contact a poison control center or emergency room at once. NOTE: This medicine is only for you. Do not share this medicine with others. What if I miss a dose? Keep appointments for follow-up doses. It is important not to miss your dose. Call your care team if you are unable to keep an appointment. What may interact with this medication? Interactions have not been studied. This list may not describe all possible interactions. Give your health care provider a list of all the medicines, herbs, non-prescription drugs, or dietary supplements you use. Also tell them if you  smoke, drink alcohol, or use illegal drugs. Some items may interact with your medicine. What should I watch for while using this medication? Your condition will be monitored carefully while you are receiving this medication. You may need blood work while taking this medication. This medication may cause serious skin reactions. They can happen weeks to months after starting the medication. Contact your care team right away if you notice fevers or flu-like symptoms with a rash. The rash may be red or purple and then turn into blisters or peeling of the skin. You may also notice a red rash with swelling of the face, lips, or lymph nodes in your neck or under your arms. Tell your care team right away if you have any change in your eyesight. Talk to your care team if you are pregnant or think you might be pregnant. A negative pregnancy test is required before starting this medication. A reliable form of contraception is recommended while taking this medication and for 5 months after the last dose. Talk to your care team about effective forms of contraception. Do not breast-feed while taking this medication and for 5 months after the last dose. What side effects may I notice from receiving this medication? Side effects that you should report to your care team as soon as possible: Allergic reactions--skin rash, itching, hives, swelling of the face, lips, tongue, or throat Dry cough, shortness of breath or trouble breathing Eye pain, redness, irritation, or discharge with blurry or decreased vision Heart muscle inflammation--unusual weakness or fatigue, shortness of breath, chest pain, fast or irregular heartbeat, dizziness, swelling of the ankles, feet, or hands Hormone  gland problems--headache, sensitivity to light, unusual weakness or fatigue, dizziness, fast or irregular heartbeat, increased sensitivity to cold or heat, excessive sweating, constipation, hair loss, increased thirst or amount of urine,  tremors or shaking, irritability Infusion reactions--chest pain, shortness of breath or trouble breathing, feeling faint or lightheaded Kidney injury (glomerulonephritis)--decrease in the amount of urine, red or dark brown urine, foamy or bubbly urine, swelling of the ankles, hands, or feet Liver injury--right upper belly pain, loss of appetite, nausea, light-colored stool, dark yellow or brown urine, yellowing skin or eyes, unusual weakness or fatigue Pain, tingling, or numbness in the hands or feet, muscle weakness, change in vision, confusion or trouble speaking, loss of balance or coordination, trouble walking, seizures Rash, fever, and swollen lymph nodes Redness, blistering, peeling, or loosening of the skin, including inside the mouth Sudden or severe stomach pain, bloody diarrhea, fever, nausea, vomiting Side effects that usually do not require medical attention (report these to your care team if they continue or are bothersome): Bone, joint, or muscle pain Diarrhea Fatigue Loss of appetite Nausea Skin rash This list may not describe all possible side effects. Call your doctor for medical advice about side effects. You may report side effects to FDA at 1-800-FDA-1088. Where should I keep my medication? This medication is given in a hospital or clinic. It will not be stored at home. NOTE: This sheet is a summary. It may not cover all possible information. If you have questions about this medicine, talk to your doctor, pharmacist, or health care provider.  2024 [employer] (2021-12-05 00:00:00)

## 2024-06-06 NOTE — Telephone Encounter (Signed)
 Patient has been scheduled for follow-up visit per 06/06/24 LOS.  Pt given an appt calendar with date and time.

## 2024-06-07 LAB — T4: T4, Total: 7.6 ug/dL (ref 4.5–12.0)

## 2024-06-22 ENCOUNTER — Encounter: Payer: Self-pay | Admitting: Oncology

## 2024-06-24 DIAGNOSIS — M5416 Radiculopathy, lumbar region: Secondary | ICD-10-CM | POA: Diagnosis not present

## 2024-07-04 ENCOUNTER — Inpatient Hospital Stay: Attending: Oncology

## 2024-07-04 ENCOUNTER — Inpatient Hospital Stay

## 2024-07-04 ENCOUNTER — Inpatient Hospital Stay (HOSPITAL_BASED_OUTPATIENT_CLINIC_OR_DEPARTMENT_OTHER): Admitting: Oncology

## 2024-07-04 ENCOUNTER — Telehealth: Payer: Self-pay | Admitting: Oncology

## 2024-07-04 ENCOUNTER — Other Ambulatory Visit: Payer: Self-pay

## 2024-07-04 VITALS — BP 124/73 | HR 70 | Temp 98.0°F | Resp 16 | Ht 69.39 in | Wt 236.4 lb

## 2024-07-04 DIAGNOSIS — C792 Secondary malignant neoplasm of skin: Secondary | ICD-10-CM

## 2024-07-04 DIAGNOSIS — Z79620 Long term (current) use of immunosuppressive biologic: Secondary | ICD-10-CM | POA: Insufficient documentation

## 2024-07-04 DIAGNOSIS — C439 Malignant melanoma of skin, unspecified: Secondary | ICD-10-CM

## 2024-07-04 DIAGNOSIS — C4372 Malignant melanoma of left lower limb, including hip: Secondary | ICD-10-CM | POA: Insufficient documentation

## 2024-07-04 DIAGNOSIS — Z5112 Encounter for antineoplastic immunotherapy: Secondary | ICD-10-CM | POA: Insufficient documentation

## 2024-07-04 LAB — CBC WITH DIFFERENTIAL (CANCER CENTER ONLY)
Abs Immature Granulocytes: 0.02 K/uL (ref 0.00–0.07)
Basophils Absolute: 0 K/uL (ref 0.0–0.1)
Basophils Relative: 0 %
Eosinophils Absolute: 0.2 K/uL (ref 0.0–0.5)
Eosinophils Relative: 3 %
HCT: 41.6 % (ref 39.0–52.0)
Hemoglobin: 14 g/dL (ref 13.0–17.0)
Immature Granulocytes: 0 %
Lymphocytes Relative: 25 %
Lymphs Abs: 2.4 K/uL (ref 0.7–4.0)
MCH: 30 pg (ref 26.0–34.0)
MCHC: 33.7 g/dL (ref 30.0–36.0)
MCV: 89.1 fL (ref 80.0–100.0)
Monocytes Absolute: 0.8 K/uL (ref 0.1–1.0)
Monocytes Relative: 9 %
Neutro Abs: 5.9 K/uL (ref 1.7–7.7)
Neutrophils Relative %: 63 %
Platelet Count: 228 K/uL (ref 150–400)
RBC: 4.67 MIL/uL (ref 4.22–5.81)
RDW: 13.2 % (ref 11.5–15.5)
WBC Count: 9.3 K/uL (ref 4.0–10.5)
nRBC: 0 % (ref 0.0–0.2)

## 2024-07-04 LAB — CMP (CANCER CENTER ONLY)
ALT: 22 U/L (ref 0–44)
AST: 23 U/L (ref 15–41)
Albumin: 4.2 g/dL (ref 3.5–5.0)
Alkaline Phosphatase: 88 U/L (ref 38–126)
Anion gap: 11 (ref 5–15)
BUN: 19 mg/dL (ref 8–23)
CO2: 25 mmol/L (ref 22–32)
Calcium: 9 mg/dL (ref 8.9–10.3)
Chloride: 103 mmol/L (ref 98–111)
Creatinine: 1.23 mg/dL (ref 0.61–1.24)
GFR, Estimated: >60 mL/min (ref >60)
Glucose, Bld: 102 mg/dL — ABNORMAL HIGH (ref 70–99)
Potassium: 4.3 mmol/L (ref 3.5–5.1)
Sodium: 140 mmol/L (ref 135–145)
Total Bilirubin: 0.8 mg/dL (ref 0.0–1.2)
Total Protein: 6.5 g/dL (ref 6.5–8.1)

## 2024-07-04 LAB — TSH: TSH: 1.03 u[IU]/mL (ref 0.350–4.500)

## 2024-07-04 MED ORDER — SODIUM CHLORIDE 0.9 % IV SOLN: Freq: Once | INTRAVENOUS | Status: AC

## 2024-07-04 MED ORDER — SODIUM CHLORIDE 0.9 % IV SOLN
480.0000 mg | Freq: Once | INTRAVENOUS | Status: AC
  Administered 2024-07-04: 480 mg via INTRAVENOUS
  Filled 2024-07-04: qty 48

## 2024-07-04 NOTE — Progress Notes (Signed)
 [facility]  [address] [place],  [place]  [address] [phone]  Clinic Day: 07/04/2024  Referring physician: Fernand Money, MD  ASSESSMENT & PLAN:  Assessment: Malignant melanoma of the left foot [name] had a punch biopsy and this revealed a Breslow thickness of 1.6 mm and Clark's level 4 with ulceration for a T2b N0 M0 clinically.  We found no distant metastasis on PET scan, but [name] did have metastasis to the left lower extremity.  His tumor is positive for BRAF and PDL1 mutation. [name] had this treated with wide local excision.   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 [name] is now on maintenance Nivolumab .  Multiple lesions of the lung These were suspicious for metastatic disease but resolved on the CT scan in December, 2023. PET scan in June was negative, but we cannot absolutely rule out metastatic disease since these lesions were too small to biopsy. We discussed the fact that the resolution of these lesions could represent a response to treatment, or may not have been malignant at all.  We think it is more likely the former, and so I recommend continuation of therapy.  We have had several discussions about this. His latest scan from January 2025 shows resolution of a multitude of pulmonary nodules. We will continue the maintenance nivolumab . CT chest, abdomen, and pelvis done on 03/06/2024 which revealed no significant interval change, no developing new mass lesion, fluid collection or lymph node enlargement, and a few tiny lung nodules are essentially stable with no increasing or dominant lung nodule.  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   Spinal stenosis at L4-5 [name] has had surgical decompression and resection of a synovial cyst in June 2023. His back pain seems to be acting up again.    Leukocytosis [name] has had chronic leukocytosis and we did not find an explanation but the [facility] continue to fluctuate up and down. His WBC was normal last month for the first time in years but is now elevated again. We will just monitor this.   Lymphedema of the Left Lower Extremity We discussed this diagnosis and the limited approaches available. This is improved.  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    Plan: [name] complains of chronic back pain rated 5/10. [name] received an epidural steroid injection last week and it has helped. His day 1, cycle 31 of nivolumab  is scheduled for today. [name] has a WBC of 9.3, hemoglobin of 14.0, and platelet count of 228,000. His CMP is completely normal. His TSH and T4 are pending. I will call him with any abnormal results. I will see him back in 4 weeks with CBC and CMP. [name] is scheduled for day 1, cycle 32 of nivolumab  on 08/01/2024. We will plan scans in January. The patient understands the plans discussed today and is in agreement with them.  [name] knows to contact our office if [name] develops concerns prior to his next appointment.  I provided 10 minutes of face-to-face time during this encounter  and > 50% was spent counseling as documented under my assessment and plan.   [name], MD  [facility] - A DEPT OF [facility] [address] [place] [address] Dept: [phone] Dept Fax: [phone]  No orders of the defined types were placed in this encounter.   CHIEF COMPLAINT:  CC: Malignant melanoma   Current Treatment:  Evaluation and immunotherapy   HISTORY OF PRESENT ILLNESS:  [name] is a  78 y.o. male with a history of malignant melanoma who is referred in consultation with Dr. Tracey Bathe for assessment and management.  [name] had a lesion of the dorsum of the left foot in September 2022 which appeared to be a granuloma and was treated in the office.  [name] later developed a another lesion lateral to this on the dorsum of the left foot which was not hyperpigmented but did bleed easily.  [name] also has several lesions of his left medial calf which are nodular and span approximately 6 cm.  [name] has had some increased edema of the left lower leg.  [name] was evaluated by dermatology on June 5, who felt this could be a squamous cell carcinoma versus tinea or a drug reaction.  [name] had biopsies done of both the foot lesion and the Lesion.  Pathology came back that the left dorsal foot shave biopsy revealed a malignant melanoma possibly a primary nodular ulcerated melanoma which had a Breslow thickness of at least 1.6 mm and a Clark's level 4.  Margins are positive as this was a shave biopsy.  Ulceration is present but no satellitosis.  The mitotic index is 4/mm and no lymphovascular invasion was identified.  No brisk tumor infiltrating lymphocytes are seen and tumor regression is absent.  This is felt to be at least a T2b lesion and wide excision was recommended.  The lesion of the left medial calf revealed a dermal map melanoma most consistent with metastatic disease. BRAF and PDL1 are positive.  This information was not available when the patient was admitted for back surgery on June 8 and [name] had a microlumbar decompression at L4-5 for spinal stenosis.  [name] was also found to have a synovial cyst at that time which was resected and benign.  [name] was readmitted several days later for acute pain of the right elbow and both arms.  Aspiration was obtained and this was diagnosed as gouty arthritis.  [name] did have temporary rehab after his back surgery.  His left leg was evaluated for deep venous thrombosis and was negative.   While [name] was recuperating from his surgery [name] had an x-ray which was abnormal which led to a CT scan which revealed at least 3 lung lesions.  MRI of the brain was negative and [name] is now referred for management of the malignant melanoma. PET scan on 01/31/2023  revealed interval resolution of previously demonstrated hypermetabolic activity within the left foot, no evidence of local recurrence of metastatic disease, resolution of right middle lobe airspace diseases seen on most recent CT. No suspicious pulmonary nodularity, new opacification of the maxillary sinus with mild peripheral hypermetabolic activity, likely inflammatory, and aortic atherosclerosis.  CT chest, abdomen, and pelvis done on 09/07/2023 that revealed the previously present multitude of the right middle lobe pulmonary micro nodule and nodules have essentially resolved, with a 3 mm residual nodule likely represent sequela of infectious or inflammatory process and no evidence of metastatic disease to the thorax, abdomen,  or pelvis.   Oncology History  Malignant melanoma, metastatic (HCC)  01/23/2022 Cancer Staging   Staging form: Melanoma of the Skin, AJCC 8th Edition - Clinical stage from 01/23/2022: Stage III (cT2b, cN1c, cM0) - Signed by Cornelius Wanda DEL, MD on 04/07/2022 Histopathologic type: Malignant melanoma, NOS (except juvenile melanoma M-8770/[id number]) Stage prefix: Initial diagnosis Laterality: Left Lymph-vascular invasion (LVI): LVI not present (absent)/not identified Diagnostic confirmation: Positive histology Specimen type: Biopsy / Limited Resection Staged by: Managing physician Mitotic count: 4 Mitotic unit: mm2 Clark's level: Level IV Tumor-infiltrating lymphocytes: Present and non-brisk Breslow depth (mm): 16 Ulceration of the epidermis: Yes Microsatellites: No Primary tumor regression: Absent Intransit/satellite metastasis: In-transit Lymph node clinically or radiologically detected: No Microscopic confirmation of  metastasis: No Sentinel lymph node biopsy performed: No Completion or therapeutic lymph node dissection performed: No Matted nodes: No Stage used in treatment planning: Yes National guidelines used in treatment planning: Yes Type of national guideline used in treatment planning: NCCN   01/31/2022 Initial Diagnosis   Malignant melanoma, metastatic (HCC)   04/14/2022 -  Chemotherapy   Patient is on Treatment Plan : MELANOMA Nivolumab  (480) q28d (changed from q14d on 12/4)     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)  02/28/2022 Cancer Staging   Staging form: Melanoma of the Skin, AJCC 8th Edition - Clinical stage from 02/28/2022: Stage IV (cT2b(m), cN1c, cM1a) - Signed by Cornelius Wanda DEL, MD on 04/07/2022 Histopathologic type: Malignant melanoma, NOS (except juvenile melanoma M-8770/[id number]) Stage prefix: Initial diagnosis Laterality: Left Multiple tumors: Yes Lymph-vascular invasion (LVI): LVI present/identified, NOS Diagnostic confirmation: Positive histology Specimen type: Excision Staged by: Managing physician Intransit/satellite metastasis: Both in-transit and satellite Prognostic indicators: 02/28/22:  Wide excision of primary of foot - no residual melanoma,  Excision of skin of calf consistent with intransit mets of dermal/subcuticular   Melanoma with LVI, metastatic disease with multifocal lesions and multifocal positive margins 03/24/22: Re-excision with a few atypical melanocytes, no melanoma and clear margins   04/07/2022 Initial Diagnosis   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 [name] also had pulmonary nodules which have resolved so we are not sure whether they were metastases or not. [name] is  therefore on maintenance immunotherapy with nivolumab . Patient states that [name] feels well, but complains of chronic back pain rated 5/10. [name] received an epidural steroid injection last week and it has helped. His day 1, cycle 31 of nivolumab  is scheduled for today. [name] has a WBC of 9.3, hemoglobin of 14.0, and platelet count of 228,000. His CMP is completely normal. His TSH and T4 are pending. I will call him with any abnormal results. I will see him back in 4 weeks with CBC and CMP. [name] is scheduled for cycle 32 of nivolumab  on 08/01/24. We will plan scans in January. [name] denies fever, chills, night sweats, or other signs of infection. [name] denies cardiorespiratory and gastrointestinal issues. [name]  denies pain. His appetite is good and His weight has increased 5 pounds over last 4 weeks.  REVIEW OF SYSTEMS:  Review of Systems  Constitutional: Negative.  Negative for appetite change, chills, diaphoresis, fatigue, fever and unexpected weight change.  HENT:  Negative.  Negative for hearing loss,  lump/mass, mouth sores, nosebleeds, sore throat, tinnitus, trouble swallowing and voice change.   Eyes: Negative.  Negative for eye problems and icterus.  Respiratory:  Negative for chest tightness, cough, hemoptysis, shortness of breath and wheezing.   Cardiovascular: Negative.  Negative for chest pain, leg swelling and palpitations.  Gastrointestinal:  Positive for diarrhea (intermittent). Negative for abdominal distention, abdominal pain, blood in stool, constipation, nausea, rectal pain and vomiting.  Endocrine: Negative.  Negative for hot flashes.  Genitourinary: Negative.  Negative for bladder incontinence, difficulty urinating, dyspareunia, dysuria, frequency, hematuria, nocturia, pelvic pain and penile discharge.   Musculoskeletal:  Positive for arthralgias (occasional left shoulder pain) and back pain (chronic, intermittent, 5/10). Negative for flank pain, gait problem, myalgias, neck pain and neck stiffness.   Skin: Negative.  Negative for itching, rash and wound.       Vitiligo  Neurological: Negative.  Negative for dizziness, extremity weakness, gait problem, headaches, light-headedness, numbness, seizures and speech difficulty.  Hematological: Negative.  Negative for adenopathy. Does not bruise/bleed easily.  Psychiatric/Behavioral: Negative.  Negative for confusion, decreased concentration, depression, sleep disturbance and suicidal ideas. The patient is not nervous/anxious.    VITALS:  Blood pressure 124/73, pulse 70, temperature 98 F (36.7 C), temperature source Oral, resp. rate 16, height 5' 9.39 (1.763 m), weight 236 lb 6.4 oz (107.2 kg), SpO2 94%.  Wt Readings from Last 3 Encounters:  07/04/24 236 lb 6.4 oz (107.2 kg)  06/06/24 231 lb 4.8 oz (104.9 kg)  05/09/24 230 lb 8 oz (104.6 kg)    Body mass index is 34.52 kg/m.  Performance status (ECOG): 1 - Symptomatic but completely ambulatory  PHYSICAL EXAM:  Physical Exam Vitals and nursing note reviewed.  Constitutional:      General: [name] is not in acute distress.    Appearance: Normal appearance. [name] is normal weight. [name] is not ill-appearing, toxic-appearing or diaphoretic.  HENT:     Head: Normocephalic and atraumatic.     Right Ear: Tympanic membrane, ear canal and external ear normal. There is no impacted cerumen.     Left Ear: Tympanic membrane, ear canal and external ear normal. There is no impacted cerumen.     Nose: Nose normal. No congestion or rhinorrhea.     Mouth/Throat:     Mouth: Mucous membranes are moist.     Pharynx: Oropharynx is clear. No oropharyngeal exudate or posterior oropharyngeal erythema.  Eyes:     General: No scleral icterus.       Right eye: No discharge.        Left eye: No discharge.     Extraocular Movements: Extraocular movements intact.     Conjunctiva/sclera: Conjunctivae normal.     Pupils: Pupils are equal, round, and reactive to light.  Cardiovascular:     Rate and Rhythm: Normal rate  and regular rhythm.     Pulses: Normal pulses.     Heart sounds: Normal heart sounds. No murmur heard.    No friction rub. No gallop.  Pulmonary:     Effort: Pulmonary effort is normal. No respiratory distress.     Breath sounds: No stridor. No wheezing, rhonchi or rales.  Chest:     Chest wall: No tenderness.  Abdominal:     General: Bowel sounds are normal. There is no distension.     Palpations: Abdomen is soft. There is no hepatomegaly, splenomegaly or mass.     Tenderness: There is no abdominal tenderness. There is no right CVA tenderness, left CVA tenderness, guarding  or rebound.     Hernia: No hernia is present.  Musculoskeletal:        General: Normal range of motion.     Cervical back: Normal range of motion and neck supple. No tenderness.     Right lower leg: No edema.     Left lower leg: Edema (mild) present.     Left foot: Swelling present.  Lymphadenopathy:     Cervical: No cervical adenopathy.     Upper Body:     Right upper body: No supraclavicular or axillary adenopathy.     Left upper body: No supraclavicular or axillary adenopathy.     Lower Body: No right inguinal adenopathy. No left inguinal adenopathy.  Skin:    General: Skin is warm and dry.     Coloration: Skin is not jaundiced.     Findings: No rash.     Comments: Stable, well-healed scar tissue on the dorsum of his left foot with some mild edema.  Large, indurated scar in the medial aspect of the left knee which is well-healed with no evidence of recurrence. Vitiligo of the skin of the left lower extremity, but also a few areas scattered on his bilateral upper extremities. Scattered benign appearing nevi  Neurological:     General: No focal deficit present.     Mental Status: [name] is alert and oriented to person, place, and time. Mental status is at baseline.     Cranial Nerves: No cranial nerve deficit.     Sensory: No sensory deficit.     Motor: No weakness.     Coordination: Coordination normal.      Gait: Gait normal.     Deep Tendon Reflexes: Reflexes normal.  Psychiatric:        Mood and Affect: Mood normal.        Behavior: Behavior normal.        Thought Content: Thought content normal.        Judgment: Judgment normal.    LABS:      Latest Ref Rng & Units 07/04/2024    1:00 PM 06/06/2024    1:52 PM 05/09/2024    1:24 PM  CBC EXTENDED  WBC 4.0 - 10.5 K/uL 9.3  10.7  9.9   RBC 4.22 - 5.81 MIL/uL 4.67  4.88  4.78   Hemoglobin 13.0 - 17.0 g/dL 85.9  85.5  85.8   HCT 39.0 - 52.0 % 41.6  42.6  42.0   Platelets 150 - 400 K/uL 228  224  232   NEUT# 1.7 - 7.7 K/uL 5.9  6.5  6.6   Lymph# 0.7 - 4.0 K/uL 2.4  2.8  2.2        Latest Ref Rng & Units 07/04/2024    1:00 PM 06/06/2024    1:52 PM 05/09/2024    1:24 PM  CMP  Glucose 70 - 99 mg/dL 897  887  95   BUN 8 - 23 mg/dL 19  22  30    Creatinine 0.61 - 1.24 mg/dL 8.76  8.75  8.81   Sodium 135 - 145 mmol/L 140  138  141   Potassium 3.5 - 5.1 mmol/L 4.3  4.3  4.1   Chloride 98 - 111 mmol/L 103  102  105   CO2 22 - 32 mmol/L 25  26  24    Calcium 8.9 - 10.3 mg/dL 9.0  9.5  9.0   Total Protein 6.5 - 8.1 g/dL 6.5  7.0  6.9   Total  Bilirubin 0.0 - 1.2 mg/dL 0.8  1.0  0.8   Alkaline Phos 38 - 126 U/L 88  86  85   AST 15 - 41 U/L 23  25  20    ALT 0 - 44 U/L 22  29  18     Lab Results  Component Value Date   LDH 140 02/01/2023   LDH 160 02/07/2022   Lab Results  Component Value Date   TSH 1.030 07/04/2024   T4TOTAL 7.2 07/04/2024   STUDIES:  EXAM: 03/06/2024 CT CHEST, ABDOMEN, AND PELVIS WITH CONTRAST IMPRESSION: Overall no significant interval change. No developing new mass lesion, fluid collection or lymph node enlargement. Few tiny lung nodules are essentially stable when adjusted for technique. No increasing or dominant lung nodule. No bowel obstruction, free air or free fluid.  Scattered stool. Bilateral Bosniak 1 renal cysts.  EXAM: 01/16/2024 CHEST - 2 VIEW IMPRESSION: No active cardiopulmonary  disease.  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    middle chest   Stroke 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 , 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     Past Surgical History:  Procedure Laterality Date   LUMBAR LAMINECTOMY/DECOMPRESSION MICRODISCECTOMY N/A 01/26/2022   Procedure: Microlumbar decompression Lumbar four-five, lateral mass fusion with autograft and allograft bone;  Surgeon: Duwayne Purchase, MD;  Location: [facility] OR;  Service: Orthopedics;  Laterality: N/A;   PARTIAL GASTRECTOMY     TOTAL HIP ARTHROPLASTY Right 06/18/2018   Procedure: RIGHT TOTAL HIP ARTHROPLASTY ANTERIOR APPROACH;  Surgeon: Ernie Cough, MD;  Location: [facility] ORS;  Service: Orthopedics;  Laterality: Right;    Family History  Problem Relation Age of Onset   Lung cancer Sister    Melanoma Brother     Social History:  reports that [name] has quit smoking. [name] has never used smokeless tobacco. [name] reports that [name] does not currently use alcohol. [name] reports that [name] does not use drugs.The patient is alone today.  Allergies:  Allergies  Allergen Reactions   Morphine Itching    Patient states it is tolerable itching    Current Medications: Current Outpatient Medications  Medication Sig Dispense Refill   amLODipine  (NORVASC ) 5 MG tablet Take 5 mg by mouth daily.     Calcium Carbonate (CALCIUM 600 PO) Take 1 tablet by mouth 2 (two) times daily.     clopidogrel  (PLAVIX ) 75 MG tablet Take 1 tablet by mouth daily.      docusate sodium  (COLACE) 100 MG capsule 1 capsule 2 TIMES DAILY (route: oral)     famotidine  (PEPCID ) 40 MG tablet TAKE 1 TABLET EVERY DAY 90 tablet 3   folic acid  (FOLVITE ) 400 MCG tablet Take 400 mcg by mouth daily.     gabapentin  (NEURONTIN ) 300 MG capsule Take 1 capsule (300 mg total) by mouth 3 (three) times daily. 270 capsule 3   hydrochlorothiazide  (HYDRODIURIL ) 12.5 MG tablet Take 12.5 mg by mouth daily.  lisinopril  (ZESTRIL ) 30 MG tablet Take 30 mg by mouth daily.     Multiple Vitamin (MULTIVITAMIN WITH MINERALS) TABS tablet Take 1 tablet by mouth in the morning.     neomycin -polymyxin-hydrocortisone (CORTISPORIN) OTIC solution Place 3 drops into the right ear 4 (four) times daily. 10 mL 1   nystatin  (MYCOSTATIN /NYSTOP ) powder Apply 1 Application topically 3 (three) times daily. 15 g 0   Omega-3 1000 MG CAPS Take by mouth.     ondansetron  (ZOFRAN ) 4 MG tablet Take 1 tablet (4 mg total) by mouth every 4 (four) hours as needed for nausea. 90 tablet 3   polyethylene glycol (MIRALAX  / GLYCOLAX ) 17 g packet Take 17 g by mouth daily. 14 each 0   pravastatin  (PRAVACHOL ) 40 MG tablet Take 40 mg by mouth daily.     prochlorperazine  (COMPAZINE ) 10 MG tablet Take 1 tablet (10 mg total) by mouth every 6 (six) hours as needed for nausea or vomiting. 90 tablet 3   traZODone  (DESYREL ) 50 MG tablet TAKE 1 TABLET AT BEDTIME AS NEEDED FOR SLEEP 90 tablet 3   triamcinolone  cream (KENALOG ) 0.1 % Apply topically 2 (two) times daily.     No current facility-administered medications for this visit.    I,Javel Hersh H Munira Polson,acting as a scribe for [name], MD.,have documented all relevant documentation on the behalf of [name], MD,as directed by  [name], MD while in the presence of [name], MD.  I have reviewed this report as typed by the medical scribe, and it is complete and accurate.

## 2024-07-04 NOTE — Patient Instructions (Signed)
 Nivolumab Injection What is this medication? NIVOLUMAB (nye VOL ue mab) treats some types of cancer. It works by helping your immune system slow or stop the spread of cancer cells. It is a monoclonal antibody. This medicine may be used for other purposes; ask your health care provider or pharmacist if you have questions. COMMON BRAND NAME(S): Opdivo What should I tell my care team before I take this medication? They need to know if you have any of these conditions: Allogeneic stem cell transplant (uses someone else's stem cells) Autoimmune diseases, such as Crohn disease, ulcerative colitis, lupus History of chest radiation Nervous system problems, such as Guillain-Barre syndrome or myasthenia gravis Organ transplant An unusual or allergic reaction to nivolumab, other medications, foods, dyes, or preservatives Pregnant or trying to get pregnant Breast-feeding How should I use this medication? This medication is infused into a vein. It is given in a hospital or clinic setting. A special MedGuide will be given to you before each treatment. Be sure to read this information carefully each time. Talk to your care team about the use of this medication in children. While it may be prescribed for children as young as 12 years for selected conditions, precautions do apply. Overdosage: If you think you have taken too much of this medicine contact a poison control center or emergency room at once. NOTE: This medicine is only for you. Do not share this medicine with others. What if I miss a dose? Keep appointments for follow-up doses. It is important not to miss your dose. Call your care team if you are unable to keep an appointment. What may interact with this medication? Interactions have not been studied. This list may not describe all possible interactions. Give your health care provider a list of all the medicines, herbs, non-prescription drugs, or dietary supplements you use. Also tell them if you  smoke, drink alcohol, or use illegal drugs. Some items may interact with your medicine. What should I watch for while using this medication? Your condition will be monitored carefully while you are receiving this medication. You may need blood work while taking this medication. This medication may cause serious skin reactions. They can happen weeks to months after starting the medication. Contact your care team right away if you notice fevers or flu-like symptoms with a rash. The rash may be red or purple and then turn into blisters or peeling of the skin. You may also notice a red rash with swelling of the face, lips, or lymph nodes in your neck or under your arms. Tell your care team right away if you have any change in your eyesight. Talk to your care team if you are pregnant or think you might be pregnant. A negative pregnancy test is required before starting this medication. A reliable form of contraception is recommended while taking this medication and for 5 months after the last dose. Talk to your care team about effective forms of contraception. Do not breast-feed while taking this medication and for 5 months after the last dose. What side effects may I notice from receiving this medication? Side effects that you should report to your care team as soon as possible: Allergic reactions--skin rash, itching, hives, swelling of the face, lips, tongue, or throat Dry cough, shortness of breath or trouble breathing Eye pain, redness, irritation, or discharge with blurry or decreased vision Heart muscle inflammation--unusual weakness or fatigue, shortness of breath, chest pain, fast or irregular heartbeat, dizziness, swelling of the ankles, feet, or hands Hormone  gland problems--headache, sensitivity to light, unusual weakness or fatigue, dizziness, fast or irregular heartbeat, increased sensitivity to cold or heat, excessive sweating, constipation, hair loss, increased thirst or amount of urine,  tremors or shaking, irritability Infusion reactions--chest pain, shortness of breath or trouble breathing, feeling faint or lightheaded Kidney injury (glomerulonephritis)--decrease in the amount of urine, red or dark brown urine, foamy or bubbly urine, swelling of the ankles, hands, or feet Liver injury--right upper belly pain, loss of appetite, nausea, light-colored stool, dark yellow or brown urine, yellowing skin or eyes, unusual weakness or fatigue Pain, tingling, or numbness in the hands or feet, muscle weakness, change in vision, confusion or trouble speaking, loss of balance or coordination, trouble walking, seizures Rash, fever, and swollen lymph nodes Redness, blistering, peeling, or loosening of the skin, including inside the mouth Sudden or severe stomach pain, bloody diarrhea, fever, nausea, vomiting Side effects that usually do not require medical attention (report these to your care team if they continue or are bothersome): Bone, joint, or muscle pain Diarrhea Fatigue Loss of appetite Nausea Skin rash This list may not describe all possible side effects. Call your doctor for medical advice about side effects. You may report side effects to FDA at 1-800-FDA-1088. Where should I keep my medication? This medication is given in a hospital or clinic. It will not be stored at home. NOTE: This sheet is a summary. It may not cover all possible information. If you have questions about this medicine, talk to your doctor, pharmacist, or health care provider.  2024 Elsevier/Gold Standard (2021-12-05 00:00:00)   [facility] [place] [facility]  Discharge Instructions: Thank you for choosing [facility] to provide your oncology and hematology care.  If you have a lab appointment with the Cancer Center, please go directly to the Cancer Center and check in at the registration area.   Wear comfortable clothing and clothing appropriate for easy  access to any Portacath or PICC line.   We strive to give you quality time with your provider. You may need to reschedule your appointment if you arrive late (15 or more minutes).  Arriving late affects you and other patients whose appointments are after yours.  Also, if you miss three or more appointments without notifying the office, you may be dismissed from the clinic at the provider's discretion.      For prescription refill requests, have your pharmacy contact our office and allow 72 hours for refills to be completed.    Today you received the following chemotherapy and/or immunotherapy agents NIVOLUMAB      To help prevent nausea and vomiting after your treatment, we encourage you to take your nausea medication as directed.  BELOW ARE SYMPTOMS THAT SHOULD BE REPORTED IMMEDIATELY: *FEVER GREATER THAN 100.4 F (38 C) OR HIGHER *CHILLS OR SWEATING *NAUSEA AND VOMITING THAT IS NOT CONTROLLED WITH YOUR NAUSEA MEDICATION *UNUSUAL SHORTNESS OF BREATH *UNUSUAL BRUISING OR BLEEDING *URINARY PROBLEMS (pain or burning when urinating, or frequent urination) *BOWEL PROBLEMS (unusual diarrhea, constipation, pain near the anus) TENDERNESS IN MOUTH AND THROAT WITH OR WITHOUT PRESENCE OF ULCERS (sore throat, sores in mouth, or a toothache) UNUSUAL RASH, SWELLING OR PAIN  UNUSUAL VAGINAL DISCHARGE OR ITCHING   Items with * indicate a potential emergency and should be followed up as soon as possible or go to the Emergency Department if any problems should occur.  Please show the CHEMOTHERAPY ALERT CARD or IMMUNOTHERAPY ALERT CARD at  check-in to the Emergency Department and triage nurse.  Should you have questions after your visit or need to cancel or reschedule your appointment, please contact [facility] [place] [facility]  Dept: [phone]  and follow the prompts.  Office hours are 8:00 a.m. to 4:30 p.m. Monday - Friday. Please note that voicemails left after  4:00 p.m. may not be returned until the following business day.  We are closed weekends and major holidays. You have access to a nurse at all times for urgent questions. Please call the main number to the clinic Dept: [phone] and follow the prompts.  For any non-urgent questions, you may also contact your provider using MyChart. We now offer e-Visits for anyone 58 and older to request care online for non-urgent symptoms. For details visit [email or web address].   Also download the MyChart app! Go to the app store, search "MyChart", open the app, select [place], and log in with your MyChart username and password.

## 2024-07-04 NOTE — Telephone Encounter (Signed)
 Patient has been scheduled for follow-up visit per 07/04/24 LOS.  Pt given an appt calendar with date and time.

## 2024-07-05 LAB — T4: T4, Total: 7.2 ug/dL (ref 4.5–12.0)

## 2024-07-06 ENCOUNTER — Other Ambulatory Visit: Payer: Self-pay

## 2024-07-17 ENCOUNTER — Encounter: Payer: Self-pay | Admitting: Oncology

## 2024-07-22 ENCOUNTER — Other Ambulatory Visit: Payer: Self-pay

## 2024-07-31 ENCOUNTER — Other Ambulatory Visit: Payer: Self-pay | Admitting: Oncology

## 2024-07-31 DIAGNOSIS — C792 Secondary malignant neoplasm of skin: Secondary | ICD-10-CM

## 2024-07-31 DIAGNOSIS — C439 Malignant melanoma of skin, unspecified: Secondary | ICD-10-CM

## 2024-08-01 ENCOUNTER — Other Ambulatory Visit: Payer: Self-pay

## 2024-08-01 ENCOUNTER — Other Ambulatory Visit: Payer: Self-pay | Admitting: Oncology

## 2024-08-01 ENCOUNTER — Inpatient Hospital Stay

## 2024-08-01 ENCOUNTER — Inpatient Hospital Stay (HOSPITAL_BASED_OUTPATIENT_CLINIC_OR_DEPARTMENT_OTHER): Admitting: Oncology

## 2024-08-01 ENCOUNTER — Inpatient Hospital Stay: Attending: Oncology

## 2024-08-01 ENCOUNTER — Telehealth: Payer: Self-pay | Admitting: Oncology

## 2024-08-01 ENCOUNTER — Inpatient Hospital Stay: Admitting: Oncology

## 2024-08-01 VITALS — BP 143/74 | HR 74 | Temp 98.3°F | Resp 16 | Ht 69.39 in | Wt 234.7 lb

## 2024-08-01 DIAGNOSIS — C439 Malignant melanoma of skin, unspecified: Secondary | ICD-10-CM

## 2024-08-01 DIAGNOSIS — C4372 Malignant melanoma of left lower limb, including hip: Secondary | ICD-10-CM | POA: Diagnosis present

## 2024-08-01 DIAGNOSIS — Z79620 Long term (current) use of immunosuppressive biologic: Secondary | ICD-10-CM | POA: Insufficient documentation

## 2024-08-01 DIAGNOSIS — Z5112 Encounter for antineoplastic immunotherapy: Secondary | ICD-10-CM | POA: Insufficient documentation

## 2024-08-01 DIAGNOSIS — C792 Secondary malignant neoplasm of skin: Secondary | ICD-10-CM

## 2024-08-01 LAB — CBC WITH DIFFERENTIAL (CANCER CENTER ONLY)
Abs Immature Granulocytes: 0.03 K/uL (ref 0.00–0.07)
Basophils Absolute: 0 K/uL (ref 0.0–0.1)
Basophils Relative: 0 %
Eosinophils Absolute: 0.5 K/uL (ref 0.0–0.5)
Eosinophils Relative: 5 %
HCT: 42.1 % (ref 39.0–52.0)
Hemoglobin: 13.9 g/dL (ref 13.0–17.0)
Immature Granulocytes: 0 %
Lymphocytes Relative: 23 %
Lymphs Abs: 2.4 K/uL (ref 0.7–4.0)
MCH: 29.4 pg (ref 26.0–34.0)
MCHC: 33 g/dL (ref 30.0–36.0)
MCV: 89.2 fL (ref 80.0–100.0)
Monocytes Absolute: 0.9 K/uL (ref 0.1–1.0)
Monocytes Relative: 8 %
Neutro Abs: 6.7 K/uL (ref 1.7–7.7)
Neutrophils Relative %: 64 %
Platelet Count: 230 K/uL (ref 150–400)
RBC: 4.72 MIL/uL (ref 4.22–5.81)
RDW: 13.1 % (ref 11.5–15.5)
WBC Count: 10.5 K/uL (ref 4.0–10.5)
nRBC: 0 % (ref 0.0–0.2)

## 2024-08-01 LAB — CMP (CANCER CENTER ONLY)
ALT: 16 U/L (ref 0–44)
AST: 22 U/L (ref 15–41)
Albumin: 4.4 g/dL (ref 3.5–5.0)
Alkaline Phosphatase: 85 U/L (ref 38–126)
Anion gap: 10 (ref 5–15)
BUN: 24 mg/dL — ABNORMAL HIGH (ref 8–23)
CO2: 26 mmol/L (ref 22–32)
Calcium: 9.2 mg/dL (ref 8.9–10.3)
Chloride: 105 mmol/L (ref 98–111)
Creatinine: 1.13 mg/dL (ref 0.61–1.24)
GFR, Estimated: >60 mL/min (ref >60)
Glucose, Bld: 116 mg/dL — ABNORMAL HIGH (ref 70–99)
Potassium: 3.8 mmol/L (ref 3.5–5.1)
Sodium: 141 mmol/L (ref 135–145)
Total Bilirubin: 0.7 mg/dL (ref 0.0–1.2)
Total Protein: 6.7 g/dL (ref 6.5–8.1)

## 2024-08-01 LAB — TSH: TSH: 1.32 u[IU]/mL (ref 0.350–4.500)

## 2024-08-01 MED ORDER — SODIUM CHLORIDE 0.9 % IV SOLN
480.0000 mg | Freq: Once | INTRAVENOUS | Status: AC
  Administered 2024-08-01: 480 mg via INTRAVENOUS
  Filled 2024-08-01: qty 48

## 2024-08-01 MED ORDER — SODIUM CHLORIDE 0.9 % IV SOLN: Freq: Once | INTRAVENOUS | Status: AC

## 2024-08-01 NOTE — Telephone Encounter (Signed)
 Patient has been scheduled for follow-up visit per 08/01/2024 LOS.  Pt given an appt calendar with date and time.

## 2024-08-01 NOTE — Patient Instructions (Signed)
 Nivolumab ; Hyaluronidase  Injection What is this medication? NIVOLUMAB ; HYALURONIDASE  (nye VOL ue mab; hye al ur ON i dase) treats some types of cancer. It works by helping your immune system slow or stop the spread of cancer cells. It is a monoclonal antibody. This medicine may be used for other purposes; ask your health care provider or pharmacist if you have questions. COMMON BRAND NAME(S): OPDIVO  QVANTIG What should I tell my care team before I take this medication? They need to know if you have any of these conditions: Autoimmune conditions, such as Crohn disease, ulcerative colitis, lupus Have received or plan to receive a stem cell transplant that uses donor stem cells (allogeneic) Have had radiation therapy to your chest Nervous system conditions, such as Guillain-Barre syndrome or myasthenia gravis Organ transplant An unusual or allergic reaction to nivolumab , hyaluronidase , other medications, foods, dyes, or preservatives Pregnant or trying to get pregnant Breastfeeding How should I use this medication? This medication is injected under the skin. It is given by your care team in a hospital or clinic setting. A special MedGuide will be given to you before each treatment. Be sure to read this information carefully each time. Talk to your care team about the use of this medication in children. Special care may be needed. Overdosage: If you think you have taken too much of this medicine contact a poison control center or emergency room at once. NOTE: This medicine is only for you. Do not share this medicine with others. What if I miss a dose? Keep appointments for follow-up doses. It is important not to miss your dose. Call your care team if you are unable to keep an appointment. What may interact with this medication? Interactions have not been studied. This list may not describe all possible interactions. Give your health care provider a list of all the medicines, herbs,  non-prescription drugs, or dietary supplements you use. Also tell them if you smoke, drink alcohol, or use illegal drugs. Some items may interact with your medicine. What should I watch for while using this medication? Your condition will be monitored carefully while you are receiving this medication. You may need blood work done while you are taking this medication. This medication may cause serious skin reactions. They can happen weeks to months after starting the medication. Contact your care team right away if you notice fevers or flu-like symptoms with a rash. The rash may be red or purple and then turn into blisters or peeling of the skin. You may also notice a red rash with swelling of the face, lips, or lymph nodes in your neck or under your arms. Talk to your care team if you may be pregnant. Serious birth defects can occur if you take this medication during pregnancy and for 5 months after the last dose. You will need a negative pregnancy test before starting this medication. Contraception is recommended while taking this medication and for 5 months after the last dose. Your care team can help you find the option that works for you. Do not breastfeed while taking this medication and for 5 months after the last dose. What side effects may I notice from receiving this medication? Side effects that you should report to your care team as soon as possible: Allergic reactions--skin rash, itching, hives, swelling of the face, lips, tongue, or throat Dry cough, shortness of breath or trouble breathing Eye pain, redness, irritation, or discharge with blurry or decreased vision Heart muscle inflammation--unusual weakness or fatigue, shortness of breath,  chest pain, fast or irregular heartbeat, dizziness, swelling of the ankles, feet, or hands Hormone gland problems--headache, sensitivity to light, unusual weakness or fatigue, dizziness, fast or irregular heartbeat, increased sensitivity to cold or heat,  excessive sweating, constipation, hair loss, increased thirst or amount of urine, tremors or shaking, irritability Kidney injury (glomerulonephritis)--decrease in the amount of urine, red or dark brown urine, foamy or bubbly urine, swelling of the ankles, hands, or feet Liver injury--right upper belly pain, loss of appetite, nausea, light-colored stool, dark yellow or brown urine, yellowing skin or eyes, unusual weakness or fatigue Pain, tingling, or numbness in the hands or feet, muscle weakness, change in vision, confusion or trouble speaking, loss of balance or coordination, trouble walking, seizures Rash, fever, and swollen lymph nodes Redness, blistering, peeling, or loosening of the skin, including inside the mouth Sudden or severe stomach pain, bloody diarrhea, fever, nausea, vomiting Side effects that usually do not require medical attention (report these to your care team if they continue or are bothersome): Bone, joint, or muscle pain Diarrhea Fatigue Loss of appetite Nausea Skin rash This list may not describe all possible side effects. Call your doctor for medical advice about side effects. You may report side effects to FDA at 1-800-FDA-1088. Where should I keep my medication? This medication is given in a hospital or clinic. It will not be stored at home. NOTE: This sheet is a summary. It may not cover all possible information. If you have questions about this medicine, talk to your doctor, pharmacist, or health care provider.  2025 [employer] Standard (2023-10-01 00:00:00)

## 2024-08-01 NOTE — Progress Notes (Signed)
 [facility]  [address] [place],  [place]  [address] [phone]  Clinic Day: 08/01/2024  Referring physician: Fernand Money, MD  ASSESSMENT & PLAN:  Assessment: Malignant melanoma of the left foot He had a punch biopsy and this revealed a Breslow thickness of 1.6 mm and Clark's level 4 with ulceration for a T2b N0 M0 clinically.  We found no distant metastasis on PET scan, but he did have metastasis to the left lower extremity.  His tumor is positive for BRAF and PDL1 mutation. He had this treated with wide local excision.   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 He is now on maintenance Nivolumab .  Multiple lesions of the lung These were suspicious for metastatic disease but resolved on the CT scan in December, 2023. PET scan in June was negative, but we cannot absolutely rule out metastatic disease since these lesions were too small to biopsy. We discussed the fact that the resolution of these lesions could represent a response to treatment, or may not have been malignant at all.  We think it is more likely the former, and so I recommend continuation of therapy.  We have had several discussions about this. His latest scan from January 2025 shows resolution of a multitude of pulmonary nodules. We will continue the maintenance nivolumab . CT chest, abdomen, and pelvis done on 03/06/2024 which revealed no significant interval change, no developing new mass lesion, fluid collection or lymph node enlargement, and a few tiny lung nodules are essentially stable with no increasing or dominant lung nodule.  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   Spinal stenosis at L4-5 He has had surgical decompression and resection of a synovial cyst in June 2023. His back pain seems to be acting up again.   Lymphedema of the Left Lower Extremity We discussed this diagnosis and the limited approaches available. This is improved.  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    Plan: He will complete day 1, cycle 32 of nivolumab  today. Patient states that he feels well, but complains of chronic left shoulder pain.  He has a WBC of 10.5, hemoglobin of 13.9, and platelet count of 230,000. His CMP is normal other than a slightly elevated BUN of 24, up from 19. His TSH and T4 are pending. His TSH and T4 from 07/06/2024 were normal. I encouraged him to ensure he drinks plenty of fluids. I will schedule an updated CT chest, abdomen, and pelvis for the end of January. I will see him back in 4 weeks with CBC, CMP, TSH, and T4. His day 1, cycle 33 of nivolumab  is scheduled for 08/30/2023. The patient understands the plans discussed today and is in agreement with them.  He knows to contact our office if he develops concerns prior to his next appointment.  I provided 13 minutes of face-to-face time during this encounter and > 50% was spent counseling as documented under my assessment and plan.   Benjamin VEAR Cornish, MD  [facility]  [facility] - A DEPT OF [facility] [address] [place] [address] Dept: [phone] Dept Fax: [phone]  No orders of the defined types were placed in this encounter.   CHIEF COMPLAINT:  CC: Malignant melanoma   Current Treatment:  Evaluation and immunotherapy   HISTORY OF PRESENT ILLNESS:  [name] is a 78 y.o. male with a history of malignant melanoma who is referred in consultation with Dr. Tracey Bathe for assessment and  management.  He had a lesion of the dorsum of the left foot in September 2022 which appeared to be a granuloma and was treated in the office.  He later developed a another lesion lateral to this on the dorsum of the left foot which was not hyperpigmented but did bleed easily.  He also has several lesions of his left medial calf which are nodular and span approximately 6 cm.  He has had some increased edema of the left lower leg.  He was evaluated by dermatology on June 5, who felt this could be a squamous cell carcinoma versus tinea or a drug reaction.  He had biopsies done of both the foot lesion and the Lesion.  Pathology came back that the left dorsal foot shave biopsy revealed a malignant melanoma possibly a primary nodular ulcerated melanoma which had a Breslow thickness of at least 1.6 mm and a Clark's level 4.  Margins are positive as this was a shave biopsy.  Ulceration is present but no satellitosis.  The mitotic index is 4/mm and no lymphovascular invasion was identified.  No brisk tumor infiltrating lymphocytes are seen and tumor regression is absent.  This is felt to be at least a T2b lesion and wide excision was recommended.  The lesion of the left medial calf revealed a dermal map melanoma most consistent with metastatic disease. BRAF and PDL1 are positive.  This information was not available when the patient was admitted for back surgery on June 8 and he had a microlumbar decompression at L4-5 for spinal stenosis.  He was also found to have a synovial cyst at that time which was resected and benign.  He was readmitted several days later for acute pain of the right elbow and both arms.  Aspiration was obtained and this was diagnosed as gouty arthritis.  He did have temporary rehab after his back surgery.  His left leg was evaluated for deep venous thrombosis and was negative.  While he was recuperating from his surgery he had an x-ray which was abnormal which led to a CT scan which revealed at least  3 lung lesions.  MRI of the brain was negative and he is now referred for management of the malignant melanoma. PET scan on 01/31/2023  revealed interval resolution of previously demonstrated hypermetabolic activity within the left foot, no evidence of local recurrence of metastatic disease, resolution of right middle lobe airspace diseases seen on most recent CT. No suspicious pulmonary nodularity, new opacification of the maxillary sinus with mild peripheral hypermetabolic activity, likely inflammatory, and aortic atherosclerosis.  CT chest, abdomen, and pelvis done on 09/07/2023 that revealed the previously present multitude of the right middle lobe pulmonary micro nodule and nodules have essentially resolved, with a 3 mm residual nodule likely represent sequela of infectious or inflammatory process and no evidence of metastatic disease to the thorax, abdomen, or pelvis.   Oncology History  Malignant melanoma, metastatic (HCC)  01/23/2022 Cancer Staging   Staging form: Melanoma of the Skin, AJCC  8th Edition - Clinical stage from 01/23/2022: Stage III (cT2b, cN1c, cM0) - Signed by Cornelius Benjamin DEL, MD on 04/07/2022 Histopathologic type: Malignant melanoma, NOS (except juvenile melanoma M-8770/[id number]) Stage prefix: Initial diagnosis Laterality: Left Lymph-vascular invasion (LVI): LVI not present (absent)/not identified Diagnostic confirmation: Positive histology Specimen type: Biopsy / Limited Resection Staged by: Managing physician Mitotic count: 4 Mitotic unit: mm2 Clark's level: Level IV Tumor-infiltrating lymphocytes: Present and non-brisk Breslow depth (mm): 16 Ulceration of the epidermis: Yes Microsatellites: No Primary tumor regression: Absent Intransit/satellite metastasis: In-transit Lymph node clinically or radiologically detected: No Microscopic confirmation of metastasis: No Sentinel lymph node biopsy performed: No Completion or therapeutic lymph node dissection performed: No Matted  nodes: No Stage used in treatment planning: Yes National guidelines used in treatment planning: Yes Type of national guideline used in treatment planning: NCCN   01/31/2022 Initial Diagnosis   Malignant melanoma, metastatic (HCC)   04/14/2022 -  Chemotherapy   Patient is on Treatment Plan : MELANOMA Nivolumab  (480) q28d (changed from q14d on 12/4)     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)  02/28/2022 Cancer Staging   Staging form: Melanoma of the Skin, AJCC 8th Edition - Clinical stage from 02/28/2022: Stage IV (cT2b(m), cN1c, cM1a) - Signed by Cornelius Benjamin DEL, MD on 04/07/2022 Histopathologic type: Malignant melanoma, NOS (except juvenile melanoma M-8770/[id number]) Stage prefix: Initial diagnosis Laterality: Left Multiple tumors: Yes Lymph-vascular invasion (LVI): LVI present/identified, NOS Diagnostic confirmation: Positive histology Specimen type: Excision Staged by: Managing physician Intransit/satellite metastasis: Both in-transit and satellite Prognostic indicators: 02/28/22:  Wide excision of primary of foot - no residual melanoma,  Excision of skin of calf consistent with intransit mets of dermal/subcuticular   Melanoma with LVI, metastatic disease with multifocal lesions and multifocal positive margins 03/24/22: Re-excision with a few atypical melanocytes, no melanoma and clear margins   04/07/2022 Initial Diagnosis   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 He also had pulmonary nodules which have resolved so we are not sure whether they were metastases or not. He is therefore on maintenance immunotherapy with nivolumab . He will complete day 1, cycle 32 of nivolumab  today. Patient states that he  feels well, but complains of chronic left shoulder pain.  He has a WBC of 10.5, hemoglobin of 13.9, and platelet count of 230,000. His CMP is normal other than a slightly elevated BUN of 24, up from 19. His TSH and T4 are pending. His TSH and T4 from 07/06/2024 were normal. I encouraged him to ensure he drinks plenty of fluids. I will schedule an updated CT chest, abdomen, and pelvis for the end of January. I will see him back in 4 weeks with CBC, CMP, TSH, and T4. His day 1, cycle 33 of nivolumab  is scheduled for 08/30/2023.   He denies fever, chills, night sweats, or other signs of infection. He denies cardiorespiratory and gastrointestinal issues. He  denies pain. His appetite is good and His weight has decreased 2 pounds over last 4 weeks.  REVIEW OF SYSTEMS:  Review of Systems  Constitutional: Negative.  Negative for appetite change, chills, diaphoresis, fatigue, fever and unexpected weight change.  HENT:  Negative.  Negative for hearing loss, lump/mass, mouth sores,  nosebleeds, sore throat, tinnitus, trouble swallowing and voice change.   Eyes: Negative.  Negative for eye problems and icterus.  Respiratory:  Negative for chest tightness, cough, hemoptysis, shortness of breath and wheezing.   Cardiovascular: Negative.  Negative for chest pain, leg swelling and palpitations.  Gastrointestinal:  Positive for diarrhea (intermittent). Negative for abdominal distention, abdominal pain, blood in stool, constipation, nausea, rectal pain and vomiting.  Endocrine: Negative.  Negative for hot flashes.  Genitourinary: Negative.  Negative for bladder incontinence, difficulty urinating, dyspareunia, dysuria, frequency, hematuria, nocturia, pelvic pain and penile discharge.   Musculoskeletal:  Positive for arthralgias (left shoulder pain) and back pain (chronic, intermittent). Negative for flank pain, gait problem, myalgias, neck pain and neck stiffness.  Skin: Negative.  Negative for itching, rash and wound.        Vitiligo  Neurological: Negative.  Negative for dizziness, extremity weakness, gait problem, headaches, light-headedness, numbness, seizures and speech difficulty.  Hematological:  Negative for adenopathy. Does not bruise/bleed easily.  Psychiatric/Behavioral: Negative.  Negative for confusion, decreased concentration, depression, sleep disturbance and suicidal ideas. The patient is not nervous/anxious.    VITALS:  Blood pressure (!) 143/74, pulse 74, temperature 98.3 F (36.8 C), temperature source Oral, resp. rate 16, height 5' 9.39 (1.763 m), weight 234 lb 11.2 oz (106.5 kg), SpO2 96%.  Wt Readings from Last 3 Encounters:  08/01/24 234 lb 11.2 oz (106.5 kg)  07/04/24 236 lb 6.4 oz (107.2 kg)  06/06/24 231 lb 4.8 oz (104.9 kg)    Body mass index is 34.27 kg/m.  Performance status (ECOG): 1 - Symptomatic but completely ambulatory  PHYSICAL EXAM:  Physical Exam Vitals and nursing note reviewed.  Constitutional:      General: He is not in acute distress.    Appearance: Normal appearance. He is normal weight. He is not ill-appearing, toxic-appearing or diaphoretic.  HENT:     Head: Normocephalic and atraumatic.     Right Ear: Tympanic membrane, ear canal and external ear normal. There is no impacted cerumen.     Left Ear: Tympanic membrane, ear canal and external ear normal. There is no impacted cerumen.     Nose: Nose normal. No congestion or rhinorrhea.     Mouth/Throat:     Mouth: Mucous membranes are moist.     Pharynx: Oropharynx is clear. No oropharyngeal exudate or posterior oropharyngeal erythema.  Eyes:     General: No scleral icterus.       Right eye: No discharge.        Left eye: No discharge.     Extraocular Movements: Extraocular movements intact.     Conjunctiva/sclera: Conjunctivae normal.     Pupils: Pupils are equal, round, and reactive to light.  Cardiovascular:     Rate and Rhythm: Normal rate and regular rhythm.     Pulses: Normal pulses.     Heart  sounds: Normal heart sounds. No murmur heard.    No friction rub. No gallop.  Pulmonary:     Effort: Pulmonary effort is normal. No respiratory distress.     Breath sounds: No stridor. No wheezing, rhonchi or rales.  Chest:     Chest wall: No tenderness.  Abdominal:     General: Bowel sounds are normal. There is no distension.     Palpations: Abdomen is soft. There is no hepatomegaly, splenomegaly or mass.     Tenderness: There is no abdominal tenderness. There is no right CVA tenderness, left CVA tenderness, guarding or rebound.  Hernia: No hernia is present.  Musculoskeletal:        General: Normal range of motion.     Cervical back: Normal range of motion and neck supple. No tenderness.     Right lower leg: No edema.     Left lower leg: 1+ Edema present.     Left foot: Swelling present.  Lymphadenopathy:     Cervical: No cervical adenopathy.     Upper Body:     Right upper body: No supraclavicular or axillary adenopathy.     Left upper body: No supraclavicular or axillary adenopathy.     Lower Body: No right inguinal adenopathy. No left inguinal adenopathy.  Skin:    General: Skin is warm and dry.     Coloration: Skin is not jaundiced.     Findings: No rash.     Comments: Stable, well-healed scar tissue on the dorsum of his left foot with some mild edema.  Large, indurated scar in the medial aspect of the left knee which is well-healed with no evidence of recurrence. Vitiligo of the skin of the left lower extremity, but also a few areas scattered on his bilateral upper extremities. No evidence of recurrence. Scattered benign appearing nevi  Neurological:     General: No focal deficit present.     Mental Status: He is alert and oriented to person, place, and time. Mental status is at baseline.     Cranial Nerves: No cranial nerve deficit.     Sensory: No sensory deficit.     Motor: No weakness.     Coordination: Coordination normal.     Gait: Gait normal.     Deep Tendon  Reflexes: Reflexes normal.  Psychiatric:        Mood and Affect: Mood normal.        Behavior: Behavior normal.        Thought Content: Thought content normal.        Judgment: Judgment normal.    LABS:      Latest Ref Rng & Units 08/01/2024   12:56 PM 07/04/2024    1:00 PM 06/06/2024    1:52 PM  CBC EXTENDED  WBC 4.0 - 10.5 K/uL 10.5  9.3  10.7   RBC 4.22 - 5.81 MIL/uL 4.72  4.67  4.88   Hemoglobin 13.0 - 17.0 g/dL 86.0  85.9  85.5   HCT 39.0 - 52.0 % 42.1  41.6  42.6   Platelets 150 - 400 K/uL 230  228  224   NEUT# 1.7 - 7.7 K/uL 6.7  5.9  6.5   Lymph# 0.7 - 4.0 K/uL 2.4  2.4  2.8        Latest Ref Rng & Units 08/01/2024   12:56 PM 07/04/2024    1:00 PM 06/06/2024    1:52 PM  CMP  Glucose 70 - 99 mg/dL 883  897  887   BUN 8 - 23 mg/dL 24  19  22    Creatinine 0.61 - 1.24 mg/dL 8.86  8.76  8.75   Sodium 135 - 145 mmol/L 141  140  138   Potassium 3.5 - 5.1 mmol/L 3.8  4.3  4.3   Chloride 98 - 111 mmol/L 105  103  102   CO2 22 - 32 mmol/L 26  25  26    Calcium 8.9 - 10.3 mg/dL 9.2  9.0  9.5   Total Protein 6.5 - 8.1 g/dL 6.7  6.5  7.0   Total Bilirubin 0.0 - 1.2  mg/dL 0.7  0.8  1.0   Alkaline Phos 38 - 126 U/L 85  88  86   AST 15 - 41 U/L 22  23  25    ALT 0 - 44 U/L 16  22  29     Lab Results  Component Value Date   LDH 140 02/01/2023   LDH 160 02/07/2022   Lab Results  Component Value Date   TSH 1.030 07/04/2024   T4TOTAL 7.2 07/04/2024   STUDIES:  EXAM: 03/06/2024 CT CHEST, ABDOMEN, AND PELVIS WITH CONTRAST IMPRESSION: Overall no significant interval change. No developing new mass lesion, fluid collection or lymph node enlargement. Few tiny lung nodules are essentially stable when adjusted for technique. No increasing or dominant lung nodule. No bowel obstruction, free air or free fluid.  Scattered stool. Bilateral Bosniak 1 renal cysts.  EXAM: 01/16/2024 CHEST - 2 VIEW IMPRESSION: No active cardiopulmonary disease.  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    middle chest   Stroke [facility])    CVA, 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 , 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     Past Surgical History:  Procedure Laterality Date   LUMBAR LAMINECTOMY/DECOMPRESSION MICRODISCECTOMY N/A 01/26/2022   Procedure: Microlumbar decompression Lumbar four-five, lateral mass fusion with autograft and allograft bone;  Surgeon: Duwayne Purchase, MD;  Location: [facility] OR;  Service: Orthopedics;  Laterality: N/A;   PARTIAL GASTRECTOMY     TOTAL HIP ARTHROPLASTY Right 06/18/2018   Procedure: RIGHT TOTAL HIP ARTHROPLASTY ANTERIOR APPROACH;  Surgeon: Ernie Cough, MD;  Location: [facility] ORS;  Service: Orthopedics;  Laterality: Right;    Family History  Problem Relation Age of Onset   Lung cancer Sister    Melanoma Brother     Social History:  reports that he has quit smoking. He has never used smokeless tobacco. He reports that he does not currently use alcohol. He reports that he does not use drugs.The patient is alone today.  Allergies:  Allergies  Allergen Reactions   Morphine Itching    Patient states it is tolerable itching    Current Medications: Current Outpatient Medications  Medication Sig Dispense Refill   amLODipine  (NORVASC ) 5 MG tablet Take 5 mg by mouth daily.     Calcium Carbonate (CALCIUM 600 PO) Take 1 tablet by mouth 2 (two) times daily.     clopidogrel  (PLAVIX ) 75 MG tablet Take 1 tablet by mouth daily.     docusate sodium  (COLACE)  100 MG capsule 1 capsule 2 TIMES DAILY (route: oral)     famotidine  (PEPCID ) 40 MG tablet TAKE 1 TABLET EVERY DAY 90 tablet 3   folic acid  (FOLVITE ) 400 MCG tablet Take 400 mcg by mouth daily.     gabapentin  (NEURONTIN ) 300 MG capsule Take 1 capsule (300 mg total) by mouth 3 (three) times daily. 270 capsule 3   hydrochlorothiazide  (HYDRODIURIL ) 12.5 MG tablet Take 12.5 mg by mouth daily.     lisinopril  (  ZESTRIL ) 30 MG tablet Take 30 mg by mouth daily.     Multiple Vitamin (MULTIVITAMIN WITH MINERALS) TABS tablet Take 1 tablet by mouth in the morning.     neomycin -polymyxin-hydrocortisone (CORTISPORIN) OTIC solution Place 3 drops into the right ear 4 (four) times daily. 10 mL 1   nystatin  (MYCOSTATIN /NYSTOP ) powder Apply 1 Application topically 3 (three) times daily. 15 g 0   Omega-3 1000 MG CAPS Take by mouth.     ondansetron  (ZOFRAN ) 4 MG tablet Take 1 tablet (4 mg total) by mouth every 4 (four) hours as needed for nausea. 90 tablet 3   polyethylene glycol (MIRALAX  / GLYCOLAX ) 17 g packet Take 17 g by mouth daily. 14 each 0   pravastatin  (PRAVACHOL ) 40 MG tablet Take 40 mg by mouth daily.     prochlorperazine  (COMPAZINE ) 10 MG tablet Take 1 tablet (10 mg total) by mouth every 6 (six) hours as needed for nausea or vomiting. 90 tablet 3   traZODone  (DESYREL ) 50 MG tablet TAKE 1 TABLET AT BEDTIME AS NEEDED FOR SLEEP 90 tablet 3   triamcinolone  cream (KENALOG ) 0.1 % Apply topically 2 (two) times daily.     No current facility-administered medications for this visit.   LILLETTE Aretta Cook, acting as a scribe for Benjamin VEAR Cornish, MD, have documented all relevant documentation on the behalf of Benjamin VEAR Cornish, MD, as directed by Benjamin VEAR Cornish, MD, while in the presence of Benjamin VEAR Cornish, MD.  I have reviewed this report as typed by the medical scribe, and it is complete and accurate.

## 2024-08-02 LAB — T4: T4, Total: 7.6 ug/dL (ref 4.5–12.0)

## 2024-08-03 ENCOUNTER — Other Ambulatory Visit: Payer: Self-pay

## 2024-08-07 ENCOUNTER — Inpatient Hospital Stay

## 2024-08-07 ENCOUNTER — Inpatient Hospital Stay: Attending: Oncology

## 2024-08-07 ENCOUNTER — Inpatient Hospital Stay: Admitting: Oncology

## 2024-08-12 ENCOUNTER — Encounter: Payer: Self-pay | Admitting: Oncology

## 2024-08-18 ENCOUNTER — Other Ambulatory Visit: Payer: Self-pay

## 2024-08-29 ENCOUNTER — Encounter: Payer: Self-pay | Admitting: Oncology

## 2024-08-29 ENCOUNTER — Other Ambulatory Visit: Payer: Self-pay | Admitting: Pharmacist

## 2024-08-29 ENCOUNTER — Inpatient Hospital Stay: Attending: Oncology | Admitting: Oncology

## 2024-08-29 ENCOUNTER — Inpatient Hospital Stay

## 2024-08-29 ENCOUNTER — Inpatient Hospital Stay: Attending: Oncology

## 2024-08-29 VITALS — BP 127/64 | HR 62 | Resp 18

## 2024-08-29 VITALS — BP 139/70 | HR 70 | Temp 98.2°F | Resp 16 | Ht 69.39 in | Wt 238.7 lb

## 2024-08-29 DIAGNOSIS — C792 Secondary malignant neoplasm of skin: Secondary | ICD-10-CM

## 2024-08-29 DIAGNOSIS — Z79620 Long term (current) use of immunosuppressive biologic: Secondary | ICD-10-CM | POA: Diagnosis not present

## 2024-08-29 DIAGNOSIS — Z5112 Encounter for antineoplastic immunotherapy: Secondary | ICD-10-CM | POA: Insufficient documentation

## 2024-08-29 DIAGNOSIS — C439 Malignant melanoma of skin, unspecified: Secondary | ICD-10-CM

## 2024-08-29 DIAGNOSIS — C4372 Malignant melanoma of left lower limb, including hip: Secondary | ICD-10-CM | POA: Insufficient documentation

## 2024-08-29 LAB — CBC WITH DIFFERENTIAL (CANCER CENTER ONLY)
Abs Immature Granulocytes: 0.05 K/uL (ref 0.00–0.07)
Basophils Absolute: 0 K/uL (ref 0.0–0.1)
Basophils Relative: 0 %
Eosinophils Absolute: 0.9 K/uL — ABNORMAL HIGH (ref 0.0–0.5)
Eosinophils Relative: 8 %
HCT: 40.2 % (ref 39.0–52.0)
Hemoglobin: 13.4 g/dL (ref 13.0–17.0)
Immature Granulocytes: 0 %
Lymphocytes Relative: 20 %
Lymphs Abs: 2.2 K/uL (ref 0.7–4.0)
MCH: 29.5 pg (ref 26.0–34.0)
MCHC: 33.3 g/dL (ref 30.0–36.0)
MCV: 88.5 fL (ref 80.0–100.0)
Monocytes Absolute: 1.1 K/uL — ABNORMAL HIGH (ref 0.1–1.0)
Monocytes Relative: 9 %
Neutro Abs: 7.1 K/uL (ref 1.7–7.7)
Neutrophils Relative %: 63 %
Platelet Count: 247 K/uL (ref 150–400)
RBC: 4.54 MIL/uL (ref 4.22–5.81)
RDW: 12.9 % (ref 11.5–15.5)
WBC Count: 11.3 K/uL — ABNORMAL HIGH (ref 4.0–10.5)
nRBC: 0 % (ref 0.0–0.2)

## 2024-08-29 LAB — CMP (CANCER CENTER ONLY)
ALT: 22 U/L (ref 0–44)
AST: 24 U/L (ref 15–41)
Albumin: 4.6 g/dL (ref 3.5–5.0)
Alkaline Phosphatase: 103 U/L (ref 38–126)
Anion gap: 11 (ref 5–15)
BUN: 25 mg/dL — ABNORMAL HIGH (ref 8–23)
CO2: 26 mmol/L (ref 22–32)
Calcium: 9.8 mg/dL (ref 8.9–10.3)
Chloride: 103 mmol/L (ref 98–111)
Creatinine: 1.23 mg/dL (ref 0.61–1.24)
GFR, Estimated: >60 mL/min
Glucose, Bld: 121 mg/dL — ABNORMAL HIGH (ref 70–99)
Potassium: 4.2 mmol/L (ref 3.5–5.1)
Sodium: 140 mmol/L (ref 135–145)
Total Bilirubin: 0.7 mg/dL (ref 0.0–1.2)
Total Protein: 6.8 g/dL (ref 6.5–8.1)

## 2024-08-29 LAB — TSH: TSH: 1.18 u[IU]/mL (ref 0.350–4.500)

## 2024-08-29 MED ORDER — SODIUM CHLORIDE 0.9 % IV SOLN: Freq: Once | INTRAVENOUS | Status: AC

## 2024-08-29 MED ORDER — SODIUM CHLORIDE 0.9 % IV SOLN
480.0000 mg | Freq: Once | INTRAVENOUS | Status: AC
  Administered 2024-08-29: 480 mg via INTRAVENOUS
  Filled 2024-08-29: qty 48

## 2024-08-29 NOTE — Patient Instructions (Signed)
 Nivolumab Injection What is this medication? NIVOLUMAB (nye VOL ue mab) treats some types of cancer. It works by helping your immune system slow or stop the spread of cancer cells. It is a monoclonal antibody. This medicine may be used for other purposes; ask your health care provider or pharmacist if you have questions. COMMON BRAND NAME(S): Opdivo What should I tell my care team before I take this medication? They need to know if you have any of these conditions: Allogeneic stem cell transplant (uses someone else's stem cells) Autoimmune diseases, such as Crohn disease, ulcerative colitis, lupus History of chest radiation Nervous system problems, such as Guillain-Barre syndrome or myasthenia gravis Organ transplant An unusual or allergic reaction to nivolumab, other medications, foods, dyes, or preservatives Pregnant or trying to get pregnant Breast-feeding How should I use this medication? This medication is infused into a vein. It is given in a hospital or clinic setting. A special MedGuide will be given to you before each treatment. Be sure to read this information carefully each time. Talk to your care team about the use of this medication in children. While it may be prescribed for children as young as 12 years for selected conditions, precautions do apply. Overdosage: If you think you have taken too much of this medicine contact a poison control center or emergency room at once. NOTE: This medicine is only for you. Do not share this medicine with others. What if I miss a dose? Keep appointments for follow-up doses. It is important not to miss your dose. Call your care team if you are unable to keep an appointment. What may interact with this medication? Interactions have not been studied. This list may not describe all possible interactions. Give your health care provider a list of all the medicines, herbs, non-prescription drugs, or dietary supplements you use. Also tell them if you  smoke, drink alcohol, or use illegal drugs. Some items may interact with your medicine. What should I watch for while using this medication? Your condition will be monitored carefully while you are receiving this medication. You may need blood work while taking this medication. This medication may cause serious skin reactions. They can happen weeks to months after starting the medication. Contact your care team right away if you notice fevers or flu-like symptoms with a rash. The rash may be red or purple and then turn into blisters or peeling of the skin. You may also notice a red rash with swelling of the face, lips, or lymph nodes in your neck or under your arms. Tell your care team right away if you have any change in your eyesight. Talk to your care team if you are pregnant or think you might be pregnant. A negative pregnancy test is required before starting this medication. A reliable form of contraception is recommended while taking this medication and for 5 months after the last dose. Talk to your care team about effective forms of contraception. Do not breast-feed while taking this medication and for 5 months after the last dose. What side effects may I notice from receiving this medication? Side effects that you should report to your care team as soon as possible: Allergic reactions--skin rash, itching, hives, swelling of the face, lips, tongue, or throat Dry cough, shortness of breath or trouble breathing Eye pain, redness, irritation, or discharge with blurry or decreased vision Heart muscle inflammation--unusual weakness or fatigue, shortness of breath, chest pain, fast or irregular heartbeat, dizziness, swelling of the ankles, feet, or hands Hormone  gland problems--headache, sensitivity to light, unusual weakness or fatigue, dizziness, fast or irregular heartbeat, increased sensitivity to cold or heat, excessive sweating, constipation, hair loss, increased thirst or amount of urine,  tremors or shaking, irritability Infusion reactions--chest pain, shortness of breath or trouble breathing, feeling faint or lightheaded Kidney injury (glomerulonephritis)--decrease in the amount of urine, red or dark brown urine, foamy or bubbly urine, swelling of the ankles, hands, or feet Liver injury--right upper belly pain, loss of appetite, nausea, light-colored stool, dark yellow or brown urine, yellowing skin or eyes, unusual weakness or fatigue Pain, tingling, or numbness in the hands or feet, muscle weakness, change in vision, confusion or trouble speaking, loss of balance or coordination, trouble walking, seizures Rash, fever, and swollen lymph nodes Redness, blistering, peeling, or loosening of the skin, including inside the mouth Sudden or severe stomach pain, bloody diarrhea, fever, nausea, vomiting Side effects that usually do not require medical attention (report these to your care team if they continue or are bothersome): Bone, joint, or muscle pain Diarrhea Fatigue Loss of appetite Nausea Skin rash This list may not describe all possible side effects. Call your doctor for medical advice about side effects. You may report side effects to FDA at 1-800-FDA-1088. Where should I keep my medication? This medication is given in a hospital or clinic. It will not be stored at home. NOTE: This sheet is a summary. It may not cover all possible information. If you have questions about this medicine, talk to your doctor, pharmacist, or health care provider.  2024 Elsevier/Gold Standard (2021-12-05 00:00:00)   [facility] [place] [facility]  Discharge Instructions: Thank you for choosing [facility] to provide your oncology and hematology care.  If you have a lab appointment with the Cancer Center, please go directly to the Cancer Center and check in at the registration area.   Wear comfortable clothing and clothing appropriate for easy  access to any Portacath or PICC line.   We strive to give you quality time with your provider. You may need to reschedule your appointment if you arrive late (15 or more minutes).  Arriving late affects you and other patients whose appointments are after yours.  Also, if you miss three or more appointments without notifying the office, you may be dismissed from the clinic at the provider's discretion.      For prescription refill requests, have your pharmacy contact our office and allow 72 hours for refills to be completed.    Today you received the following chemotherapy and/or immunotherapy agents NIVOLUMAB      To help prevent nausea and vomiting after your treatment, we encourage you to take your nausea medication as directed.  BELOW ARE SYMPTOMS THAT SHOULD BE REPORTED IMMEDIATELY: *FEVER GREATER THAN 100.4 F (38 C) OR HIGHER *CHILLS OR SWEATING *NAUSEA AND VOMITING THAT IS NOT CONTROLLED WITH YOUR NAUSEA MEDICATION *UNUSUAL SHORTNESS OF BREATH *UNUSUAL BRUISING OR BLEEDING *URINARY PROBLEMS (pain or burning when urinating, or frequent urination) *BOWEL PROBLEMS (unusual diarrhea, constipation, pain near the anus) TENDERNESS IN MOUTH AND THROAT WITH OR WITHOUT PRESENCE OF ULCERS (sore throat, sores in mouth, or a toothache) UNUSUAL RASH, SWELLING OR PAIN  UNUSUAL VAGINAL DISCHARGE OR ITCHING   Items with * indicate a potential emergency and should be followed up as soon as possible or go to the Emergency Department if any problems should occur.  Please show the CHEMOTHERAPY ALERT CARD or IMMUNOTHERAPY ALERT CARD at  check-in to the Emergency Department and triage nurse.  Should you have questions after your visit or need to cancel or reschedule your appointment, please contact [facility] [place] [facility]  Dept: [phone]  and follow the prompts.  Office hours are 8:00 a.m. to 4:30 p.m. Monday - Friday. Please note that voicemails left after  4:00 p.m. may not be returned until the following business day.  We are closed weekends and major holidays. You have access to a nurse at all times for urgent questions. Please call the main number to the clinic Dept: [phone] and follow the prompts.  For any non-urgent questions, you may also contact your provider using MyChart. We now offer e-Visits for anyone 58 and older to request care online for non-urgent symptoms. For details visit [email or web address].   Also download the MyChart app! Go to the app store, search "MyChart", open the app, select [place], and log in with your MyChart username and password.

## 2024-08-29 NOTE — Progress Notes (Signed)
 " [facility]  [address] [place],  [place]  [address] [phone]  Clinic Day: 08/29/2024  Referring physician: Fernand Money, MD  ASSESSMENT & PLAN:  Assessment: Malignant melanoma of the left foot He had a punch biopsy and this revealed a Breslow thickness of 1.6 mm and Clark's level 4 with ulceration for a T2b N0 M0 clinically.  We found no distant metastasis on PET scan, but he did have metastasis to the left lower extremity.  His tumor is positive for BRAF and PDL1 mutation. He had this treated with wide local excision.   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 He is now on maintenance Nivolumab .  Multiple lesions of the lung These were suspicious for metastatic disease but resolved on the CT scan in December, 2023. PET scan in June was negative, but we cannot absolutely rule out metastatic disease since these lesions were too small to biopsy. We discussed the fact that the resolution of these lesions could represent a response to treatment, or may not have been malignant at all.  We think it is more likely the former, and so I recommend continuation of therapy.  We have had several discussions about this. His latest scan from January 2025 shows resolution of a multitude of pulmonary nodules. We will continue the maintenance nivolumab . CT chest, abdomen, and pelvis done on 03/06/2024 which revealed no significant interval change, no developing new mass lesion, fluid collection or lymph node enlargement, and a few tiny lung nodules are essentially stable with no increasing or dominant lung nodule.  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   Spinal stenosis at L4-5 He has had surgical decompression and resection of a synovial cyst in June 2023. His back pain seems to be acting up again.   Lymphedema of the Left Lower Extremity We discussed this diagnosis and the limited approaches available. This is improved.  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    Plan: He will complete day 1, cycle 33 of nivolumab  today. Patient states that he feels well and has no complaints of pain. He reports that he has minor cold-like symptoms such as rhinorrhea. He has a slightly elevated WBC of 11.3 with an ANC of 7100 up from 10.5 with an ANC of 6700, hemoglobin of 13.4, and platelet count of 247,000. His CMP is normal other than an elevated BUN of 25, up from 24. His TSH and T4 are pending. He has an upcoming CT chest, abdomen, and pelvis scan on 09/18/2024 and is scheduled to have hip arthroplasty on 09/23/2024 so we will skip his February cycle of treatment. I will see him back in 8 weeks with CBC, CMP, TSH, T4, and cycle 34 of nivolumab . The patient understands the plans discussed today and is in agreement with them.  He knows to contact our office if he develops concerns prior to his next appointment.  I provided 9 minutes of face-to-face time during this encounter and > 50% was spent counseling as documented under my  assessment and plan.   [name], MD  [facility] - A DEPT OF [facility] [address] [place] [address] Dept: [phone] Dept Fax: [phone]  No orders of the defined types were placed in this encounter.   CHIEF COMPLAINT:  CC: Malignant melanoma   Current Treatment:  Evaluation and immunotherapy   HISTORY OF PRESENT ILLNESS:  [name] is a 79 y.o. male with a history of malignant melanoma who is  referred in consultation with Dr. Tracey Bathe for assessment and management.  He had a lesion of the dorsum of the left foot in September 2022 which appeared to be a granuloma and was treated in the office.  He later developed a another lesion lateral to this on the dorsum of the left foot which was not hyperpigmented but did bleed easily.  He also has several lesions of his left medial calf which are nodular and span approximately 6 cm.  He has had some increased edema of the left lower leg.  He was evaluated by dermatology on June 5, who felt this could be a squamous cell carcinoma versus tinea or a drug reaction.  He had biopsies done of both the foot lesion and the Lesion.  Pathology came back that the left dorsal foot shave biopsy revealed a malignant melanoma possibly a primary nodular ulcerated melanoma which had a Breslow thickness of at least 1.6 mm and a Clark's level 4.  Margins are positive as this was a shave biopsy.  Ulceration is present but no satellitosis.  The mitotic index is 4/mm and no lymphovascular invasion was identified.  No brisk tumor infiltrating lymphocytes are seen and tumor regression is absent.  This is felt to be at least a T2b lesion and wide excision was recommended.  The lesion of the left medial calf revealed a dermal map melanoma most consistent with metastatic disease. BRAF and PDL1 are positive.  This information was not available when the patient was admitted for back surgery on June 8 and he had a microlumbar decompression at L4-5 for spinal stenosis.  He was also found to have a synovial cyst at that time which was resected and benign.  He was readmitted several days later for acute pain of the right elbow and both arms.  Aspiration was obtained and this was diagnosed as gouty arthritis.  He did have temporary rehab after his back surgery.  His left leg was evaluated for deep venous thrombosis and was negative.  While he was recuperating from his surgery he had an x-ray  which was abnormal which led to a CT scan which revealed at least 3 lung lesions.  MRI of the brain was negative and he is now referred for management of the malignant melanoma. PET scan on 01/31/2023  revealed interval resolution of previously demonstrated hypermetabolic activity within the left foot, no evidence of local recurrence of metastatic disease, resolution of right middle lobe airspace diseases seen on most recent CT. No suspicious pulmonary nodularity, new opacification of the maxillary sinus with mild peripheral hypermetabolic activity, likely inflammatory, and aortic atherosclerosis.  CT chest, abdomen, and pelvis done on 09/07/2023 that revealed the previously present multitude of the right middle lobe pulmonary micro nodule and nodules have essentially resolved, with a 3 mm residual nodule likely represent sequela of infectious or inflammatory process and no evidence of metastatic disease to the thorax, abdomen, or pelvis.   Oncology History  Malignant melanoma, metastatic (  HCC)  01/23/2022 Cancer Staging   Staging form: Melanoma of the Skin, AJCC 8th Edition - Clinical stage from 01/23/2022: Stage III (cT2b, cN1c, cM0) - Signed by Cornelius Wanda DEL, MD on 04/07/2022 Histopathologic type: Malignant melanoma, NOS (except juvenile melanoma M-8770/[id number]) Stage prefix: Initial diagnosis Laterality: Left Lymph-vascular invasion (LVI): LVI not present (absent)/not identified Diagnostic confirmation: Positive histology Specimen type: Biopsy / Limited Resection Staged by: Managing physician Mitotic count: 4 Mitotic unit: mm2 Clark's level: Level IV Tumor-infiltrating lymphocytes: Present and non-brisk Breslow depth (mm): 16 Ulceration of the epidermis: Yes Microsatellites: No Primary tumor regression: Absent Intransit/satellite metastasis: In-transit Lymph node clinically or radiologically detected: No Microscopic confirmation of metastasis: No Sentinel lymph node biopsy performed:  No Completion or therapeutic lymph node dissection performed: No Matted nodes: No Stage used in treatment planning: Yes National guidelines used in treatment planning: Yes Type of national guideline used in treatment planning: NCCN   01/31/2022 Initial Diagnosis   Malignant melanoma, metastatic (HCC)   04/14/2022 -  Chemotherapy   Patient is on Treatment Plan : MELANOMA Nivolumab  (480) q28d (changed from q14d on 12/4)     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)  02/28/2022 Cancer Staging   Staging form: Melanoma of the Skin, AJCC 8th Edition - Clinical stage from 02/28/2022: Stage IV (cT2b(m), cN1c, cM1a) - Signed by Cornelius Wanda DEL, MD on 04/07/2022 Histopathologic type: Malignant melanoma, NOS (except juvenile melanoma M-8770/[id number]) Stage prefix: Initial diagnosis Laterality: Left Multiple tumors: Yes Lymph-vascular invasion (LVI): LVI present/identified, NOS Diagnostic confirmation: Positive histology Specimen type: Excision Staged by: Managing physician Intransit/satellite metastasis: Both in-transit and satellite Prognostic indicators: 02/28/22:  Wide excision of primary of foot - no residual melanoma,  Excision of skin of calf consistent with intransit mets of dermal/subcuticular   Melanoma with LVI, metastatic disease with multifocal lesions and multifocal positive margins 03/24/22: Re-excision with a few atypical melanocytes, no melanoma and clear margins   04/07/2022 Initial Diagnosis   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 He also had pulmonary nodules which have resolved so we are not sure whether they were metastases or not. He is therefore on maintenance immunotherapy with nivolumab . He  will complete day 1, cycle 33 of nivolumab  today. Patient states that he feels and has no complaints of pain. He reports that he has minor cold-like symptoms such as rhinorrhea. He has a slightly elevated WBC of 11.3 with an ANC of 7100 up from 10.5 with an ANC of 6700, hemoglobin of 13.4, and platelet count of 247,000. His CMP is normal other than an elevated BUN of 25, up from 24. His TSH and T4 are pending. He has an upcoming CT chest, abdomen, and pelvis scan on 09/18/2024 and is scheduled to have hip arthroplasty on 09/23/2024 so we will skip his February cycle of treatment. I will see him back in 8 weeks with CBC, CMP, TSH, T4, and cycle 34 of nivolumab .  He denies fever, chills, night sweats, or other signs of infection. He denies cardiorespiratory and gastrointestinal issues. He  denies pain. His appetite is good and His weight has increased 4 pounds over last 4 weeks.  REVIEW OF SYSTEMS:  Review of Systems  Constitutional: Negative.  Negative for appetite change, chills, diaphoresis, fatigue, fever and unexpected weight change.  HENT:  Negative.  Negative for hearing loss, lump/mass, mouth sores, nosebleeds, sore throat, tinnitus, trouble swallowing and voice change.        Rhinorrhea  Eyes: Negative.  Negative for eye problems and icterus.  Respiratory:  Negative for chest tightness, cough, hemoptysis, shortness of breath and wheezing.   Cardiovascular: Negative.  Negative for chest pain, leg swelling and palpitations.  Gastrointestinal:  Positive for diarrhea (intermittent). Negative for abdominal distention, abdominal pain, blood in stool, constipation, nausea, rectal pain and vomiting.  Endocrine: Negative.  Negative for hot flashes.  Genitourinary: Negative.  Negative for bladder incontinence, difficulty urinating, dyspareunia, dysuria, frequency, hematuria, nocturia, pelvic pain and penile discharge.   Musculoskeletal:  Positive for arthralgias and back pain (chronic, intermittent).  Negative for flank pain, gait problem, myalgias, neck pain and neck stiffness.  Skin: Negative.  Negative for itching, rash and wound.       Vitiligo  Neurological: Negative.  Negative for dizziness, extremity weakness, gait problem, headaches, light-headedness, numbness, seizures and speech difficulty.  Hematological:  Negative for adenopathy. Does not bruise/bleed easily.  Psychiatric/Behavioral: Negative.  Negative for confusion, decreased concentration, depression, sleep disturbance and suicidal ideas. The patient is not nervous/anxious.    VITALS:  Blood pressure 139/70, pulse 70, temperature 98.2 F (36.8 C), temperature source Oral, resp. rate 16, height 5' 9.39 (1.763 m), weight 238 lb 11.2 oz (108.3 kg), SpO2 96%.  Wt Readings from Last 3 Encounters:  08/29/24 238 lb 11.2 oz (108.3 kg)  08/01/24 234 lb 11.2 oz (106.5 kg)  07/04/24 236 lb 6.4 oz (107.2 kg)    Body mass index is 34.85 kg/m.  Performance status (ECOG): 1 - Symptomatic but completely ambulatory  PHYSICAL EXAM:  Physical Exam Vitals and nursing note reviewed.  Constitutional:      General: He is not in acute distress.    Appearance: Normal appearance. He is normal weight. He is not ill-appearing, toxic-appearing or diaphoretic.  HENT:     Head: Normocephalic and atraumatic.     Right Ear: Tympanic membrane, ear canal and external ear normal. There is no impacted cerumen.     Left Ear: Tympanic membrane, ear canal and external ear normal. There is no impacted cerumen.     Nose: Nose normal. No congestion or rhinorrhea.     Mouth/Throat:     Mouth: Mucous membranes are moist.     Pharynx: Oropharynx is clear. No oropharyngeal exudate or posterior oropharyngeal erythema.  Eyes:     General: No scleral icterus.       Right eye: No discharge.        Left eye: No discharge.     Extraocular Movements: Extraocular movements intact.     Conjunctiva/sclera: Conjunctivae normal.     Pupils: Pupils are equal, round,  and reactive to light.  Cardiovascular:     Rate and Rhythm: Normal rate and regular rhythm.     Pulses: Normal pulses.     Heart sounds: Normal heart sounds. No murmur heard.    No friction rub. No gallop.  Pulmonary:     Effort: Pulmonary effort is normal. No respiratory distress.     Breath sounds: No stridor. No wheezing, rhonchi or rales.  Chest:     Chest wall: No tenderness.  Abdominal:     General: Bowel sounds are normal. There is no distension.     Palpations: Abdomen is soft. There is no hepatomegaly, splenomegaly  or mass.     Tenderness: There is no abdominal tenderness. There is no right CVA tenderness, left CVA tenderness, guarding or rebound.     Hernia: No hernia is present.  Musculoskeletal:        General: Normal range of motion.     Cervical back: Normal range of motion and neck supple. No tenderness.     Right lower leg: No edema.     Left lower leg: 1+ Edema present.     Left foot: Swelling present.  Lymphadenopathy:     Cervical: No cervical adenopathy.     Upper Body:     Right upper body: No supraclavicular or axillary adenopathy.     Left upper body: No supraclavicular or axillary adenopathy.     Lower Body: No right inguinal adenopathy. No left inguinal adenopathy.  Skin:    General: Skin is warm and dry.     Coloration: Skin is not jaundiced.     Findings: No rash.     Comments: Stable, well-healed scar tissue on the dorsum of his left foot with some mild edema.  Large, indurated scar in the medial aspect of the left knee which is well-healed with no evidence of recurrence. Vitiligo of the skin of the left lower extremity, but also a few areas scattered on his bilateral upper extremities. No evidence of recurrence. Scattered benign appearing nevi  Neurological:     General: No focal deficit present.     Mental Status: He is alert and oriented to person, place, and time. Mental status is at baseline.     Cranial Nerves: No cranial nerve deficit.      Sensory: No sensory deficit.     Motor: No weakness.     Coordination: Coordination normal.     Gait: Gait normal.     Deep Tendon Reflexes: Reflexes normal.  Psychiatric:        Mood and Affect: Mood normal.        Behavior: Behavior normal.        Thought Content: Thought content normal.        Judgment: Judgment normal.    LABS:      Latest Ref Rng & Units 08/29/2024    1:18 PM 08/01/2024   12:56 PM 07/04/2024    1:00 PM  CBC EXTENDED  WBC 4.0 - 10.5 K/uL 11.3  10.5  9.3   RBC 4.22 - 5.81 MIL/uL 4.54  4.72  4.67   Hemoglobin 13.0 - 17.0 g/dL 86.5  86.0  85.9   HCT 39.0 - 52.0 % 40.2  42.1  41.6   Platelets 150 - 400 K/uL 247  230  228   NEUT# 1.7 - 7.7 K/uL 7.1  6.7  5.9   Lymph# 0.7 - 4.0 K/uL 2.2  2.4  2.4        Latest Ref Rng & Units 08/29/2024    1:18 PM 08/01/2024   12:56 PM 07/04/2024    1:00 PM  CMP  Glucose 70 - 99 mg/dL 878  883  897   BUN 8 - 23 mg/dL 25  24  19    Creatinine 0.61 - 1.24 mg/dL 8.76  8.86  8.76   Sodium 135 - 145 mmol/L 140  141  140   Potassium 3.5 - 5.1 mmol/L 4.2  3.8  4.3   Chloride 98 - 111 mmol/L 103  105  103   CO2 22 - 32 mmol/L 26  26  25    Calcium 8.9 -  10.3 mg/dL 9.8  9.2  9.0   Total Protein 6.5 - 8.1 g/dL 6.8  6.7  6.5   Total Bilirubin 0.0 - 1.2 mg/dL 0.7  0.7  0.8   Alkaline Phos 38 - 126 U/L 103  85  88   AST 15 - 41 U/L 24  22  23    ALT 0 - 44 U/L 22  16  22     Lab Results  Component Value Date   LDH 140 02/01/2023   LDH 160 02/07/2022   Lab Results  Component Value Date   TSH 1.180 08/29/2024   T4TOTAL 7.2 08/29/2024   STUDIES:  EXAM: 03/06/2024 CT CHEST, ABDOMEN, AND PELVIS WITH CONTRAST IMPRESSION: Overall no significant interval change. No developing new mass lesion, fluid collection or lymph node enlargement. Few tiny lung nodules are essentially stable when adjusted for technique. No increasing or dominant lung nodule. No bowel obstruction, free air or free fluid.  Scattered stool. Bilateral Bosniak 1  renal cysts.  EXAM: 01/16/2024 CHEST - 2 VIEW IMPRESSION: No active cardiopulmonary disease.  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 , 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     Past Surgical History:  Procedure Laterality Date   LUMBAR LAMINECTOMY/DECOMPRESSION MICRODISCECTOMY N/A 01/26/2022   Procedure: Microlumbar decompression Lumbar four-five, lateral mass fusion with autograft and allograft bone;  Surgeon: Duwayne Purchase, MD;  Location: [facility] OR;  Service: Orthopedics;  Laterality: N/A;   PARTIAL GASTRECTOMY     TOTAL HIP ARTHROPLASTY Right 06/18/2018   Procedure: RIGHT TOTAL HIP ARTHROPLASTY ANTERIOR APPROACH;  Surgeon: Ernie Cough, MD;  Location: [facility] ORS;  Service: Orthopedics;  Laterality: Right;    Family History  Problem Relation Age of Onset   Lung cancer Sister    Melanoma Brother     Social History:  reports that he has quit smoking. He has never used smokeless tobacco. He reports that he does not currently use alcohol. He reports that he does not use drugs.The patient is alone today.  Allergies:  Allergies  Allergen Reactions   Morphine Itching    Patient states it is tolerable itching    Current Medications: Current Outpatient Medications  Medication Sig Dispense Refill   amLODipine  (NORVASC ) 5 MG tablet Take 5 mg by mouth daily.     Calcium Carbonate (CALCIUM 600 PO) Take 1 tablet by mouth 2 (two)  times daily.     clopidogrel  (PLAVIX ) 75 MG tablet Take 1 tablet by mouth daily.     docusate sodium  (COLACE) 100 MG capsule 1 capsule 2 TIMES DAILY (route: oral)     famotidine  (PEPCID ) 40 MG tablet TAKE 1 TABLET EVERY DAY 90 tablet 3   folic acid  (FOLVITE ) 400 MCG tablet Take 400 mcg by mouth daily.     gabapentin  (NEURONTIN ) 300 MG capsule Take 1 capsule (300 mg total)  by mouth 3 (three) times daily. 270 capsule 3   hydrochlorothiazide  (HYDRODIURIL ) 12.5 MG tablet Take 12.5 mg by mouth daily.     lisinopril  (ZESTRIL ) 30 MG tablet Take 30 mg by mouth daily.     Multiple Vitamin (MULTIVITAMIN WITH MINERALS) TABS tablet Take 1 tablet by mouth in the morning.     neomycin -polymyxin-hydrocortisone (CORTISPORIN) OTIC solution Place 3 drops into the right ear 4 (four) times daily. 10 mL 1   nystatin  (MYCOSTATIN /NYSTOP ) powder Apply 1 Application topically 3 (three) times daily. 15 g 0   Omega-3 1000 MG CAPS Take by mouth.     ondansetron  (ZOFRAN ) 4 MG tablet Take 1 tablet (4 mg total) by mouth every 4 (four) hours as needed for nausea. 90 tablet 3   polyethylene glycol (MIRALAX  / GLYCOLAX ) 17 g packet Take 17 g by mouth daily. 14 each 0   pravastatin  (PRAVACHOL ) 40 MG tablet Take 40 mg by mouth daily.     prochlorperazine  (COMPAZINE ) 10 MG tablet Take 1 tablet (10 mg total) by mouth every 6 (six) hours as needed for nausea or vomiting. 90 tablet 3   traZODone  (DESYREL ) 50 MG tablet TAKE 1 TABLET AT BEDTIME AS NEEDED FOR SLEEP 90 tablet 3   triamcinolone  cream (KENALOG ) 0.1 % Apply topically 2 (two) times daily.     No current facility-administered medications for this visit.   LILLETTE Aretta Cook, acting as a scribe for [name], MD, have documented all relevant documentation on the behalf of [name], MD, as directed by [name], MD, while in the presence of [name], MD.  I have reviewed this report as typed by the medical scribe, and it is complete and  accurate.  "

## 2024-08-30 ENCOUNTER — Other Ambulatory Visit: Payer: Self-pay

## 2024-08-31 LAB — T4: T4, Total: 7.2 ug/dL (ref 4.5–12.0)

## 2024-09-03 ENCOUNTER — Encounter: Payer: Self-pay | Admitting: Oncology

## 2024-09-15 NOTE — Patient Instructions (Signed)
 SURGICAL WAITING ROOM VISITATION Patients having surgery or a procedure may have no more than 2 support people in the waiting area - these visitors may rotate in the visitor waiting room.   If the patient needs to stay at the hospital during part of their recovery, the visitor guidelines for inpatient rooms apply.  PRE-OP VISITATION  Pre-op nurse will coordinate an appropriate time for 1 support person to accompany the patient in pre-op.  This support person may not rotate.  This visitor will be contacted when the time is appropriate for the visitor to come back in the pre-op area.  Please refer to the [facility] website for the visitor guidelines for Inpatients (after your surgery is over and you are in a regular room).  Temporary Visitor Restrictions  Children ages 70 and under will not be able to visit patients in [facility] under most circumstances. Visitation is not restricted outside of hospitals unless noted otherwise in the [facility] and Location Specific Visitation Guidelines at :      [email or web address] Visitors with respiratory illnesses are discouraged from visiting and should remain at home.  You are not required to quarantine at this time prior to your surgery. However, you must do this: Hand Hygiene often Do NOT share personal items Notify your provider if you are in close contact with someone who has COVID or you develop fever 100.4 or greater, new onset of sneezing, cough, sore throat, shortness of breath or body aches.  If you test positive for Covid or have been in contact with anyone that has tested positive in the last 10 days please notify you surgeon.    Your procedure is scheduled on:  TUESDAY  09-23-2024  Report to [facility] Main Entrance: [address] entrance where the [employer] is available.   Report to admitting at: 09:45    AM  Call this number if you have any questions or problems the morning of surgery [phone]  Do not eat food  after Midnight the night prior to your surgery/procedure.  After Midnight you may have the following liquids until   09:15 AM DAY OF SURGERY  Clear Liquid Diet Water  Black Coffee (sugar ok, NO MILK/CREAM OR CREAMERS)  Tea (sugar ok, NO MILK/CREAM OR CREAMERS) regular and decaf                             Plain Jell-O  with no fruit (NO RED)                                           Fruit ices (not with fruit pulp, NO RED)                                     Popsicles (NO RED)                                                                  Juice: NO CITRUS JUICES: only apple, WHITE grape, WHITE cranberry Sports drinks like Gatorade or Powerade (NO RED)  The day of surgery:  Drink ONE (1) Pre-Surgery Clear Ensure at  09:15 AM the morning of surgery. Drink in one sitting. Do not sip.  This drink was given to you during your hospital pre-op appointment visit. Nothing else to drink after completing the Pre-Surgery Clear Ensure : No candy, chewing gum or throat lozenges.    FOLLOW ANY ADDITIONAL PRE OP INSTRUCTIONS YOU RECEIVED FROM YOUR SURGEON'S OFFICE!!!   Oral Hygiene is also important to reduce your risk of infection.        Remember - BRUSH YOUR TEETH THE MORNING OF SURGERY WITH YOUR REGULAR TOOTHPASTE  Do NOT smoke after Midnight the night before surgery.  STOP TAKING all Vitamins, Herbs and supplements 1 week before your surgery.   Clopidogrel  (PLAVIX ) - stop taking 1 week before your surgery. Last dose will be taken on 09-16-2024  Take ONLY these medicines the morning of surgery with A SIP OF WATER : Amlodipine  (NORVASC ), gabapentin  (NEURONTIN ),   DO NOT TAKE : Lisinopril  (ZESTRIL ) hydrochlorothiazide  (HYDRODIURIL ) on the day of your surgery.   You may not have any metal on your body including jewelry, and body piercing  Do not wear  lotions, powders,cologne, or deodorant  Men may shave face and neck.  Contacts, Hearing Aids, dentures or bridgework may  not be worn into surgery. DENTURES WILL BE REMOVED PRIOR TO SURGERY PLEASE DO NOT APPLY Poly grip OR ADHESIVES!!!  You may bring a small overnight bag with you on the day of surgery, only pack items that are not valuable. [name] IS NOT RESPONSIBLE   FOR VALUABLES THAT ARE LOST OR STOLEN.   Do not bring your home medications to the hospital. The Pharmacy will dispense medications listed on your medication list to you during your admission in the Hospital.  Special Instructions: Bring a copy of your healthcare power of attorney and living will documents the day of surgery, if you wish to have them scanned into your [facility]- EPIC  Please read over the following fact sheets you were given: IF YOU HAVE QUESTIONS ABOUT YOUR PRE-OP INSTRUCTIONS, PLEASE CALL [phone].      Pre-operative 4 CHG Bath Instructions   You can play a key role in reducing the risk of infection after surgery. Your skin needs to be as free of germs as possible. You can reduce the number of germs on your skin by washing with CHG (chlorhexidine  gluconate) soap before surgery. CHG is an antiseptic soap that kills germs and continues to kill germs even after washing.   DO NOT use if you have an allergy to chlorhexidine /CHG or antibacterial soaps. If your skin becomes reddened or irritated, stop using the CHG and notify one of our RNs at [phone]  Please shower with the CHG soap starting 4 days before surgery using the following schedule: THURSDAY  09-19-2024     Do NOT use CHG soap  the morning of your                                                                                                                                 surgery.         Please keep in mind the following:  DO NOT shave, including legs and underarms, starting the day of your first shower.   You may shave  your face at any point before/day of surgery.  Place clean sheets on your bed the day you start using CHG soap. Use a clean washcloth (not used since being washed) for each shower. DO NOT sleep with pets once you start using the CHG.  CHG Shower Instructions:  If you choose to wash your hair and private area, wash first with your normal shampoo/soap.  After you use shampoo/soap, rinse your hair and body thoroughly to remove shampoo/soap residue.  Turn the water  OFF and apply about 3 tablespoons (45 ml) of CHG soap to a CLEAN washcloth.  Apply CHG soap ONLY FROM YOUR NECK DOWN TO YOUR TOES (washing for 3-5 minutes)  DO NOT use CHG soap on face, private areas, open wounds, or sores.  Pay special attention to the area where your surgery is being performed.  If you are having back surgery, having someone wash your back for you may be helpful. Wait 2 minutes after CHG soap is applied, then you may rinse off the CHG soap.  Pat dry with a clean towel  Put on clean clothes/pajamas   If you choose to wear lotion, please use ONLY the CHG-compatible lotions on the back of this paper.     Additional instructions for the day of surgery: DO NOT APPLY any CHG Soap,  lotions, deodorants, cologne, or perfumes on the day of surgery  Put on clean/comfortable clothes.  Brush your teeth.  Ask your nurse before applying any prescription medications to the skin.   CHG Compatible Lotions   Aveeno Moisturizing lotion  Cetaphil Moisturizing Cream  Cetaphil Moisturizing Lotion  Clairol Herbal Essence Moisturizing Lotion, Dry Skin  Clairol Herbal Essence Moisturizing Lotion, Extra Dry Skin  Clairol Herbal Essence Moisturizing Lotion, Normal Skin  Curel Age Defying Therapeutic Moisturizing Lotion with Alpha Hydroxy  Curel Extreme Care Body Lotion  Curel Soothing Hands Moisturizing Hand Lotion  Curel Therapeutic Moisturizing Cream, Fragrance-Free  Curel Therapeutic Moisturizing Lotion, Fragrance-Free  Curel  Therapeutic Moisturizing Lotion, Original Formula  Eucerin Daily Replenishing Lotion  Eucerin Dry Skin Therapy Plus Alpha Hydroxy Crme  Eucerin Dry Skin Therapy Plus Alpha Hydroxy Lotion  Eucerin Original Crme  Eucerin Original Lotion  Eucerin Plus Crme Eucerin Plus Lotion  Eucerin TriLipid Replenishing Lotion  Keri Anti-Bacterial Hand Lotion  Keri Deep Conditioning Original Lotion Dry Skin Formula Softly Scented  Keri Deep Conditioning Original Lotion, Fragrance Free Sensitive Skin Formula  Keri Lotion Fast Absorbing Fragrance Free Sensitive Skin Formula  Keri Lotion Fast Absorbing Softly Scented Dry Skin  Formula  Keri Original Lotion  Keri Skin Renewal Lotion Keri Silky Smooth Lotion  Keri Silky Smooth Sensitive Skin Lotion  Nivea Body Creamy Conditioning Oil  Nivea Body Extra Enriched Lotion  Nivea Body Original Lotion  Nivea Body Sheer Moisturizing Lotion Nivea Crme  Nivea Skin Firming Lotion  NutraDerm 30 Skin Lotion  NutraDerm Skin Lotion  NutraDerm Therapeutic Skin Cream  NutraDerm Therapeutic Skin Lotion  ProShield Protective Hand Cream  Provon moisturizing lotion   FAILURE TO FOLLOW THESE INSTRUCTIONS MAY RESULT IN THE CANCELLATION OF YOUR SURGERY  PATIENT SIGNATURE_________________________________  NURSE SIGNATURE__________________________________  ________________________________________________________________________        [name]    An incentive spirometer is a tool that can help keep your lungs clear and active. This tool measures how well you are filling your lungs with each breath. Taking long deep breaths may help reverse or decrease the chance of developing breathing (pulmonary) problems (especially infection) following: A long period of time when you are unable to move or be active. BEFORE THE PROCEDURE  If the spirometer includes an indicator to show your best effort, your nurse or respiratory therapist will set it to a desired  goal. If possible, sit up straight or lean slightly forward. Try not to slouch. Hold the incentive spirometer in an upright position. INSTRUCTIONS FOR USE  Sit on the edge of your bed if possible, or sit up as far as you can in bed or on a chair. Hold the incentive spirometer in an upright position. Breathe out normally. Place the mouthpiece in your mouth and seal your lips tightly around it. Breathe in slowly and as deeply as possible, raising the piston or the ball toward the top of the column. Hold your breath for 3-5 seconds or for as long as possible. Allow the piston or ball to fall to the bottom of the column. Remove the mouthpiece from your mouth and breathe out normally. Rest for a few seconds and repeat Steps 1 through 7 at least 10 times every 1-2 hours when you are awake. Take your time and take a few normal breaths between deep breaths. The spirometer may include an indicator to show your best effort. Use the indicator as a goal to work toward during each repetition. After each set of 10 deep breaths, practice coughing to be sure your lungs are clear. If you have an incision (the cut made at the time of surgery), support your incision when coughing by placing a pillow or rolled up towels firmly against it. Once you are able to get out of bed, walk around indoors and cough well. You may stop using the incentive spirometer when instructed by your caregiver.  RISKS AND COMPLICATIONS Take your time so you do not get dizzy or light-headed. If you are in pain, you may need to take or ask for pain medication before doing incentive spirometry. It is harder to take a deep breath if you are having pain. AFTER USE Rest and breathe slowly and easily. It can be helpful to keep track of a log of your progress. Your caregiver can provide you with a simple table to help with this. If you are using the spirometer at home, follow these instructions: SEEK MEDICAL CARE IF:  You are having difficultly  using the spirometer. You have trouble using the spirometer as often as instructed. Your pain medication is not giving enough relief while using the spirometer. You develop fever of 100.5 F (38.1 C) or higher.  SEEK IMMEDIATE MEDICAL CARE IF:  You cough up bloody sputum that had not been present before. You develop fever of 102 F (38.9 C) or greater. You develop worsening pain at or near the incision site. MAKE SURE YOU:  Understand these instructions. Will watch your condition. Will get help right away if you are not doing well or get worse. Document Released: 12/18/2006 Document Revised: 10/30/2011 Document Reviewed: 02/18/2007 [facility] Patient Information 2014 [place], [place].       WHAT IS A BLOOD TRANSFUSION? Blood Transfusion Information  A transfusion is the replacement of blood or some of its parts. Blood is made up of multiple cells which provide different functions. Red blood cells carry oxygen and are used for blood loss replacement. White blood cells fight against infection. Platelets control bleeding. Plasma helps clot blood. Other blood products are available for specialized needs, such as hemophilia or other clotting disorders. BEFORE THE TRANSFUSION  Who gives blood for transfusions?  Healthy volunteers who are fully evaluated to make sure their blood is safe. This is blood bank blood. Transfusion therapy is the safest it has ever been in the practice of medicine. Before blood is taken from a donor, a complete history is taken to make sure that person has no history of diseases nor engages in risky social behavior (examples are intravenous drug use or sexual activity with multiple partners). The donor's travel history is screened to minimize risk of transmitting infections, such as malaria. The donated blood is tested for signs of infectious diseases, such as HIV and  hepatitis. The blood is then tested to be sure it is compatible with you in order to minimize the chance of a transfusion reaction. If you or a relative donates blood, this is often done in anticipation of surgery and is not appropriate for emergency situations. It takes many days to process the donated blood. RISKS AND COMPLICATIONS Although transfusion therapy is very safe and saves many lives, the main dangers of transfusion include:  Getting an infectious disease. Developing a transfusion reaction. This is an allergic reaction to something in the blood you were given. Every precaution is taken to prevent this. The decision to have a blood transfusion has been considered carefully by your caregiver before blood is given. Blood is not given unless the benefits outweigh the risks. AFTER THE TRANSFUSION Right after receiving a blood transfusion, you will usually feel much better and more energetic. This is especially true if your red blood cells have gotten low (anemic). The transfusion raises the level of the red blood cells which carry oxygen, and this usually causes an energy increase. The nurse administering the transfusion will monitor you carefully for complications. HOME CARE INSTRUCTIONS  No special instructions are needed after a transfusion. You may find your energy is better. Speak with your caregiver about any limitations on activity for underlying diseases you may have. SEEK MEDICAL CARE IF:  Your condition is not improving after your transfusion. You develop redness or irritation at the intravenous (IV) site. SEEK IMMEDIATE MEDICAL CARE IF:  Any of the following symptoms occur over the next 12 hours: Shaking chills. You have a temperature by mouth above 102 F (38.9 C), not controlled by medicine. Chest, back, or muscle pain. People around you feel you are not acting correctly or are confused. Shortness of breath or difficulty breathing. Dizziness and fainting. You get a rash or  develop hives. You have a decrease in urine output. Your urine turns a dark color or changes to  pink, red, or brown. Any of the following symptoms occur over the next 10 days: You have a temperature by mouth above 102 F (38.9 C), not controlled by medicine. Shortness of breath. Weakness after normal activity. The white part of the eye turns yellow (jaundice). You have a decrease in the amount of urine or are urinating less often. Your urine turns a dark color or changes to pink, red, or brown. Document Released: 08/04/2000 Document Revised: 10/30/2011 Document Reviewed: 03/23/2008 [facility] Patient Information 2014 [employer], [place].  _______________________________________________________________________       If you would like to see a video about joint replacement:   [email or web address]

## 2024-09-15 NOTE — Progress Notes (Signed)
 The patient was identified using 2 approved identifiers. All issues noted in this document were discussed and addressed, [name]  voiced understanding and agreement with all preoperative instructions. The patient was emailed the surgery instructions per his / her request.     Medication Recon: 09-09-24    [name] will come to our PST office on 09-17-24 at 1400 to have labs, EKG, etc.   Date of any COVID positive Test in last 90 days:  PCP - Tracey Bathe, MD   clearance scanned to [facility] 09-10-24 Cardiologist -  Oncology- Wanda Cornish, MD at [facility]   Chest x-ray - 01-16-2024 EKG -  01-19-2022   repeat Stress Test -  ECHO -  Cardiac Cath -  CT Coronary Calcium score:  ZIO monitor-   Pacemaker / ICD device [x]  No []  Yes   Spinal Cord Stimulator:[x]  No []  Yes       History of Sleep Apnea? []  No []  Yes   CPAP used?- []  No []  Yes    Medication on DOS:  Amlodipine  (NORVASC ), gabapentin  (NEURONTIN ),   Hold DOS: Lisinopril  (ZESTRIL ) hydrochlorothiazide  (HYDRODIURIL )  Patient has: [x]  NO Hx DM   []  Pre-DM   []  DM1  []   DM2 Does the patient monitor blood sugar?   [x]  N/A   []  No []  Yes   Blood Thinner / Instructions:clopidogrel  (PLAVIX ) hold x 7 days per Dr. Bathe  last dose: 09-16-2024 Aspirin  Instructions:  Activity level: Able to walk up 2 flights of stairs without becoming significantly short of breath or having chest pain?   []    Yes   []  No,  would have:  Patient can perform ADLs without assistance.  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

## 2024-09-16 ENCOUNTER — Encounter (HOSPITAL_COMMUNITY): Payer: Self-pay

## 2024-09-16 ENCOUNTER — Encounter (HOSPITAL_COMMUNITY)
Admission: RE | Admit: 2024-09-16 | Discharge: 2024-09-16 | Disposition: A | Discharge status: Home / Self Care | Source: Ambulatory Visit | Attending: Orthopedic Surgery | Admitting: Orthopedic Surgery

## 2024-09-16 VITALS — Ht 69.25 in | Wt 238.0 lb

## 2024-09-16 DIAGNOSIS — M1612 Unilateral primary osteoarthritis, left hip: Secondary | ICD-10-CM

## 2024-09-16 DIAGNOSIS — I10 Essential (primary) hypertension: Secondary | ICD-10-CM

## 2024-09-16 DIAGNOSIS — Z01818 Encounter for other preprocedural examination: Secondary | ICD-10-CM

## 2024-09-16 DIAGNOSIS — Z9221 Personal history of antineoplastic chemotherapy: Secondary | ICD-10-CM

## 2024-09-16 HISTORY — DX: Gastro-esophageal reflux disease without esophagitis: K21.9

## 2024-09-16 HISTORY — DX: Pneumonia, unspecified organism: J18.9

## 2024-09-17 ENCOUNTER — Encounter (HOSPITAL_COMMUNITY)
Admission: RE | Admit: 2024-09-17 | Discharge: 2024-09-17 | Disposition: A | Discharge status: Home / Self Care | Source: Ambulatory Visit | Attending: Orthopedic Surgery | Admitting: Orthopedic Surgery

## 2024-09-17 DIAGNOSIS — Z9221 Personal history of antineoplastic chemotherapy: Secondary | ICD-10-CM | POA: Insufficient documentation

## 2024-09-17 DIAGNOSIS — I10 Essential (primary) hypertension: Secondary | ICD-10-CM | POA: Insufficient documentation

## 2024-09-17 DIAGNOSIS — M1612 Unilateral primary osteoarthritis, left hip: Secondary | ICD-10-CM | POA: Diagnosis not present

## 2024-09-17 DIAGNOSIS — Z01818 Encounter for other preprocedural examination: Secondary | ICD-10-CM | POA: Insufficient documentation

## 2024-09-17 LAB — COMPREHENSIVE METABOLIC PANEL WITH GFR
ALT: 19 U/L (ref 0–44)
AST: 21 U/L (ref 15–41)
Albumin: 4.4 g/dL (ref 3.5–5.0)
Alkaline Phosphatase: 92 U/L (ref 38–126)
Anion gap: 11 (ref 5–15)
BUN: 21 mg/dL (ref 8–23)
CO2: 25 mmol/L (ref 22–32)
Calcium: 9.5 mg/dL (ref 8.9–10.3)
Chloride: 106 mmol/L (ref 98–111)
Creatinine, Ser: 1.13 mg/dL (ref 0.61–1.24)
GFR, Estimated: >60 mL/min
Glucose, Bld: 119 mg/dL — ABNORMAL HIGH (ref 70–99)
Potassium: 4.2 mmol/L (ref 3.5–5.1)
Sodium: 141 mmol/L (ref 135–145)
Total Bilirubin: 0.8 mg/dL (ref 0.0–1.2)
Total Protein: 6.8 g/dL (ref 6.5–8.1)

## 2024-09-17 LAB — CBC
HCT: 42.8 % (ref 39.0–52.0)
Hemoglobin: 13.7 g/dL (ref 13.0–17.0)
MCH: 29.1 pg (ref 26.0–34.0)
MCHC: 32 g/dL (ref 30.0–36.0)
MCV: 91.1 fL (ref 80.0–100.0)
Platelets: 218 10*3/uL (ref 150–400)
RBC: 4.7 MIL/uL (ref 4.22–5.81)
RDW: 12.5 % (ref 11.5–15.5)
WBC: 21.5 10*3/uL — ABNORMAL HIGH (ref 4.0–10.5)
nRBC: 0 % (ref 0.0–0.2)

## 2024-09-17 LAB — SURGICAL PCR SCREEN
MRSA, PCR: NEGATIVE
Staphylococcus aureus: NEGATIVE

## 2024-09-18 ENCOUNTER — Inpatient Hospital Stay

## 2024-09-18 ENCOUNTER — Ambulatory Visit (HOSPITAL_BASED_OUTPATIENT_CLINIC_OR_DEPARTMENT_OTHER)
Admission: RE | Admit: 2024-09-18 | Discharge: 2024-09-18 | Disposition: A | Discharge status: Home / Self Care | Source: Ambulatory Visit | Attending: Oncology | Admitting: Oncology

## 2024-09-18 DIAGNOSIS — C792 Secondary malignant neoplasm of skin: Secondary | ICD-10-CM

## 2024-09-18 MED ORDER — IOHEXOL 300 MG/ML  SOLN
100.0000 mL | Freq: Once | INTRAMUSCULAR | Status: AC | PRN
  Administered 2024-09-18: 100 mL via INTRAVENOUS

## 2024-09-18 MED ORDER — HEPARIN SOD (PORK) LOCK FLUSH 100 UNIT/ML IV SOLN
500.0000 [IU] | Freq: Once | INTRAVENOUS | Status: AC
  Administered 2024-09-18: 500 [IU] via INTRAVENOUS

## 2024-09-19 ENCOUNTER — Other Ambulatory Visit: Payer: Self-pay | Admitting: Oncology

## 2024-09-19 ENCOUNTER — Telehealth: Payer: Self-pay

## 2024-09-19 ENCOUNTER — Encounter (HOSPITAL_COMMUNITY): Payer: Self-pay

## 2024-09-19 DIAGNOSIS — D72829 Elevated white blood cell count, unspecified: Secondary | ICD-10-CM

## 2024-09-19 NOTE — Anesthesia Preprocedure Evaluation (Signed)
"                                    Anesthesia Evaluation    Airway        Dental   Pulmonary former smoker          Cardiovascular hypertension,      Neuro/Psych    GI/Hepatic   Endo/Other    Renal/GU      Musculoskeletal   Abdominal   Peds  Hematology   Anesthesia Other Findings   Reproductive/Obstetrics                              Anesthesia Physical Anesthesia Plan  ASA:   Anesthesia Plan:    Post-op Pain Management:    Induction:   PONV Risk Score and Plan:   Airway Management Planned:   Additional Equipment:   Intra-op Plan:   Post-operative Plan:   Informed Consent:   Plan Discussed with:   Anesthesia Plan Comments: (See PAT note from 1/27)         Anesthesia Quick Evaluation  "

## 2024-09-19 NOTE — Telephone Encounter (Signed)
 Patient called stating that he is scheduled for surgery soon and anesthesiologist called stating that his WBC's were high. Patient is questioning why is WBC's might be elevated. No symptoms of infection that patient knows of.

## 2024-09-21 NOTE — H&P (Signed)
 TOTAL HIP ADMISSION H&P  Patient is admitted for left total hip arthroplasty.   Therapy Plans: HEP Disposition: Home with wife & daughter near by Planned DVT Prophylaxis: clopidogrel  75 mg DME needed: none PCP: Dr. Fernand -- printed clearance form to take in [place]: IV Allergies: morphine Anesthesia Concerns: none BMI: 34.2 Last HgbA1c: not diabetic   Other: - staying overnight - had right THA in 2019 - did well - Hx of CVA in 2010 - on plavix    Subjective:  Chief Complaint: Left hip pain  HPI: [name], 79 y.o. male, has a history of pain and functional disability in the left hip due to arthritis and patient has failed non-surgical conservative treatments for greater than 12 weeks to include NSAID's and/or analgesics and activity modification. Onset of symptoms was gradual, starting 2 years ago with gradual worsening course since that time. The patient noted no prior surgeries on the left hip. Patient currently rates pain in the left hip at 8 out of 10 with activity. Patient has worsening of pain with activity and weight bearing, pain that interfers with activities of daily living, and pain with passive range of motion. Patient has evidence of joint space narrowing by imaging studies. This condition presents safety issues increasing the risk of falls. There is no current active infection.  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   GERD (gastroesophageal reflux disease)    History of colon polyps    History of kidney stones    Hypercholesteremia     Hypertension    Leukocytosis 06/16/2022   Malignant melanoma (HCC)    of the left foot with in-transit metastases   Peptic ulcer    Pneumonia    Spinal stenosis at L4-L5 level    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    middle chest   Stroke [facility])    CVA, 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 , 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     Past Surgical History:  Procedure Laterality Date   LUMBAR LAMINECTOMY/DECOMPRESSION MICRODISCECTOMY N/A 01/26/2022   Procedure: Microlumbar decompression Lumbar four-five, lateral mass fusion with autograft and allograft bone;  Surgeon: Duwayne Purchase, MD;  Location: [facility] OR;  Service: Orthopedics;  Laterality: N/A;   PARTIAL GASTRECTOMY  1980   for ulcer, GI bleed   TOTAL HIP ARTHROPLASTY Right 06/18/2018   Procedure: RIGHT TOTAL HIP ARTHROPLASTY ANTERIOR APPROACH;  Surgeon: Ernie Cough, MD;  Location: [facility] ORS;  Service: Orthopedics;  Laterality: Right;    Prior to Admission medications  Medication Sig Start Date End Date Taking? Authorizing Provider  amLODipine  (NORVASC ) 5 MG tablet Take 5 mg by mouth in the morning.   Yes [provider]  clopidogrel  (PLAVIX ) 75 MG tablet Take 75 mg by mouth in the morning.   Yes [provider]  diclofenac Sodium (VOLTAREN) 1 % GEL Apply 2 g topically 4 (four) times daily as needed (pain.).   Yes [provider]  diphenhydramine -acetaminophen  (TYLENOL  PM) 25-500 MG TABS tablet Take 2 tablets  by mouth at bedtime as needed (sleep/pain.).   Yes [provider]  famotidine  (PEPCID ) 40 MG tablet TAKE 1 TABLET EVERY DAY Patient taking differently: Take 40 mg by mouth at bedtime. 05/12/24  Yes Cornelius Wanda DEL, MD  folic acid  (FOLVITE ) 400 MCG tablet Take 400 mcg by mouth in the morning.   Yes [provider]  gabapentin  (NEURONTIN ) 600 MG tablet Take 600 mg by mouth in the morning.   Yes [provider]  hydrochlorothiazide  (HYDRODIURIL ) 12.5 MG tablet Take 12.5 mg  by mouth in the morning. 02/01/22  Yes [provider]  ibuprofen (ADVIL) 200 MG tablet Take 400 mg by mouth daily as needed (back pain.).   Yes [provider]  lisinopril  (ZESTRIL ) 30 MG tablet Take 30 mg by mouth in the morning. 12/25/21  Yes [provider]  Multiple Vitamin (MULTIVITAMIN WITH MINERALS) TABS tablet Take 1 tablet by mouth in the morning.   Yes [provider]  polyethylene glycol (MIRALAX  / GLYCOLAX ) 17 g packet Take 17 g by mouth daily. Patient taking differently: Take 17 g by mouth daily as needed (constipation). 01/26/22  Yes Duwayne Purchase, MD  pravastatin  (PRAVACHOL ) 40 MG tablet Take 40 mg by mouth daily.   Yes [provider]  traZODone  (DESYREL ) 50 MG tablet TAKE 1 TABLET AT BEDTIME AS NEEDED FOR SLEEP Patient taking differently: Take 50 mg by mouth at bedtime. for sleep 04/16/24  Yes Mosher, Andrez LABOR, PA-C    Allergies[1]  Social History   Socioeconomic History   Marital status: Married    Spouse name: Jenkins   Number of children: 2   Years of education: 12   Highest education level: 12th grade  Occupational History    Comment: Retired from working with [employer] and did have chemical exposures with that.  Tobacco Use   Smoking status: Former   Smokeless tobacco: Never   Tobacco comments:    quit 40 years ago   Vaping Use   Vaping status: Never Used  Substance and Sexual Activity   Alcohol use: Not Currently    Comment: moderate  ; 2-3 beers each weekend    Drug use: Never   Sexual activity: Not Currently  Other Topics Concern   Not on file  Social History Narrative   They have a son and daughter.  He is retired from working with [employer] and did have chemical exposures with that.   Social Drivers of Health   Tobacco Use: Medium Risk (09/19/2024)   Patient History    Smoking Tobacco Use: Former    Smokeless Tobacco Use: Never    Passive Exposure: Not on Actuary Strain: Not on file  Food  Insecurity: Not on file  Transportation Needs: Not on file  Physical Activity: Not on file  Stress: Not on file  Social Connections: Not on file  Intimate Partner Violence: Not on file  Depression (PHQ2-9): Low Risk (08/29/2024)   Depression (PHQ2-9)    PHQ-2 Score: 0  Alcohol Screen: Not on file  Housing: Not on file  Utilities: Not on file  Health Literacy: Not on file    Tobacco Use: Medium Risk (09/19/2024)   Patient History    Smoking Tobacco Use: Former    Smokeless Tobacco Use: Never    Passive Exposure: Not on file   Social History   Substance and Sexual Activity  Alcohol Use Not Currently   Comment: moderate  ; 2-3 beers each weekend  Family History  Problem Relation Age of Onset   Lung cancer Sister    Melanoma Brother     Review Of Systems: Constitutional: Constitutional: no fever, chills, night sweats, or significant weight loss. Cardiovascular: Cardiovascular: no palpitations or chest pain. Respiratory: Respiratory: no cough or shortness of breath and No COPD. Gastrointestinal: Gastrointestinal: no vomiting or nausea. Musculoskeletal: Musculoskeletal: Joint Pain and swelling in Joints. Neurologic: Neurologic: no numbness, tingling, or difficulty with balance.   Objective:  Physical Exam: Left hip exam: Reproducible anterior groin pain with hip flexion internal rotation to 5 degrees with pelvic tilting due to pain, external rotation over 20 degrees No significant loss of strength No lower extremity edema, erythema or calf tenderness Right hip exam: Mild tightness with hip flexion internal rotation to 20 degrees, external rotation to 30 degrees 5/5 strength  Vital signs in last 24 hours:    Imaging Review Plain radiographs demonstrate severe degenerative joint disease of the left hip. The bone quality appears to be adequate for age and reported activity level.  Assessment/Plan:  End stage arthritis, left hip  The patient history, physical  examination, clinical judgement of the provider and imaging studies are consistent with end stage degenerative joint disease of the left hip and total hip arthroplasty is deemed medically necessary. The treatment options including medical management, injection therapy, arthroscopy and arthroplasty were discussed at length. The risks and benefits of total hip arthroplasty were presented and reviewed. The risks due to aseptic loosening, infection, stiffness, dislocation/subluxation, thromboembolic complications and other imponderables were discussed. The patient acknowledged the explanation, agreed to proceed with the plan and consent was signed. Patient is being admitted for inpatient treatment for surgery, pain control, PT, OT, prophylactic antibiotics, VTE prophylaxis, progressive ambulation and ADLs and discharge planning.The patient is planning to be discharged home.  Rosina Calin, PA-C Orthopedic Surgery [facility] [place] [phone]      [1]  Allergies Allergen Reactions   Morphine Itching    Patient states it is tolerable itching

## 2024-09-23 ENCOUNTER — Encounter (HOSPITAL_COMMUNITY): Payer: Self-pay | Admitting: Orthopedic Surgery

## 2024-09-23 ENCOUNTER — Ambulatory Visit (HOSPITAL_COMMUNITY)
Admission: RE | Admit: 2024-09-23 | Discharge: 2024-09-23 | Disposition: A | Discharge status: Home / Self Care | Source: Ambulatory Visit | Attending: Orthopedic Surgery | Admitting: Orthopedic Surgery

## 2024-09-23 ENCOUNTER — Ambulatory Visit (HOSPITAL_COMMUNITY)

## 2024-09-23 ENCOUNTER — Encounter (HOSPITAL_COMMUNITY)
Admission: RE | Disposition: A | Discharge status: Home / Self Care | Payer: Self-pay | Source: Ambulatory Visit | Attending: Orthopedic Surgery

## 2024-09-23 ENCOUNTER — Ambulatory Visit (HOSPITAL_COMMUNITY): Admitting: Anesthesiology

## 2024-09-23 ENCOUNTER — Other Ambulatory Visit: Payer: Self-pay

## 2024-09-23 ENCOUNTER — Ambulatory Visit (HOSPITAL_COMMUNITY): Payer: Self-pay | Admitting: Medical

## 2024-09-23 DIAGNOSIS — Z96642 Presence of left artificial hip joint: Secondary | ICD-10-CM

## 2024-09-23 DIAGNOSIS — Z87891 Personal history of nicotine dependence: Secondary | ICD-10-CM

## 2024-09-23 DIAGNOSIS — I69398 Other sequelae of cerebral infarction: Secondary | ICD-10-CM | POA: Insufficient documentation

## 2024-09-23 DIAGNOSIS — M1612 Unilateral primary osteoarthritis, left hip: Secondary | ICD-10-CM

## 2024-09-23 DIAGNOSIS — I10 Essential (primary) hypertension: Secondary | ICD-10-CM | POA: Diagnosis not present

## 2024-09-23 DIAGNOSIS — Z8711 Personal history of peptic ulcer disease: Secondary | ICD-10-CM | POA: Insufficient documentation

## 2024-09-23 DIAGNOSIS — M1990 Unspecified osteoarthritis, unspecified site: Secondary | ICD-10-CM | POA: Insufficient documentation

## 2024-09-23 DIAGNOSIS — Z7902 Long term (current) use of antithrombotics/antiplatelets: Secondary | ICD-10-CM | POA: Insufficient documentation

## 2024-09-23 DIAGNOSIS — K219 Gastro-esophageal reflux disease without esophagitis: Secondary | ICD-10-CM | POA: Insufficient documentation

## 2024-09-23 LAB — TYPE AND SCREEN
ABO/RH(D): O POS
Antibody Screen: NEGATIVE

## 2024-09-23 MED ORDER — OXYCODONE HCL 5 MG PO TABS
5.0000 mg | ORAL_TABLET | Freq: Once | ORAL | Status: AC | PRN
  Administered 2024-09-23: 5 mg via ORAL

## 2024-09-23 MED ORDER — DROPERIDOL 2.5 MG/ML IJ SOLN: 0.6250 mg | Freq: Once | INTRAMUSCULAR | Status: DC | PRN

## 2024-09-23 MED ORDER — SODIUM CHLORIDE (PF) 0.9 % IJ SOLN
INTRAMUSCULAR | Status: AC
  Filled 2024-09-23: qty 30

## 2024-09-23 MED ORDER — FENTANYL CITRATE (PF) 100 MCG/2ML IJ SOLN
INTRAMUSCULAR | Status: DC | PRN
  Administered 2024-09-23: 50 ug via INTRAVENOUS

## 2024-09-23 MED ORDER — DEXAMETHASONE SOD PHOSPHATE PF 10 MG/ML IJ SOLN
8.0000 mg | Freq: Once | INTRAMUSCULAR | Status: AC
  Administered 2024-09-23: 5 mg via INTRAVENOUS

## 2024-09-23 MED ORDER — PHENYLEPHRINE HCL-NACL 20-0.9 MG/250ML-% IV SOLN
INTRAVENOUS | Status: DC | PRN
  Administered 2024-09-23: 40 ug/min via INTRAVENOUS

## 2024-09-23 MED ORDER — KETOROLAC TROMETHAMINE 30 MG/ML IJ SOLN
INTRAMUSCULAR | Status: AC
  Filled 2024-09-23: qty 1

## 2024-09-23 MED ORDER — ORAL CARE MOUTH RINSE: 15.0000 mL | Freq: Once | OROMUCOSAL | Status: AC

## 2024-09-23 MED ORDER — SODIUM CHLORIDE (PF) 0.9 % IJ SOLN
INTRAMUSCULAR | Status: DC | PRN
  Administered 2024-09-23: 61 mL

## 2024-09-23 MED ORDER — HYDROMORPHONE HCL 1 MG/ML IJ SOLN: 0.5000 mg | INTRAMUSCULAR | Status: DC | PRN

## 2024-09-23 MED ORDER — PROPOFOL 10 MG/ML IV BOLUS
INTRAVENOUS | Status: DC | PRN
  Administered 2024-09-23: 20 mg via INTRAVENOUS
  Administered 2024-09-23: 30 mg via INTRAVENOUS
  Administered 2024-09-23: 20 mg via INTRAVENOUS

## 2024-09-23 MED ORDER — OXYCODONE HCL 5 MG/5ML PO SOLN: 5.0000 mg | Freq: Once | ORAL | Status: AC | PRN

## 2024-09-23 MED ORDER — PROPOFOL 1000 MG/100ML IV EMUL
INTRAVENOUS | Status: AC
  Filled 2024-09-23: qty 100

## 2024-09-23 MED ORDER — OXYCODONE HCL 5 MG PO TABS
ORAL_TABLET | ORAL | Status: AC
  Filled 2024-09-23: qty 1

## 2024-09-23 MED ORDER — FENTANYL CITRATE (PF) 50 MCG/ML IJ SOSY
PREFILLED_SYRINGE | INTRAMUSCULAR | Status: AC
  Filled 2024-09-23: qty 1

## 2024-09-23 MED ORDER — POVIDONE-IODINE 10 % EX SWAB
2.0000 | Freq: Once | CUTANEOUS | Status: AC
  Administered 2024-09-23: 2 via TOPICAL

## 2024-09-23 MED ORDER — CEFAZOLIN SODIUM-DEXTROSE 2-4 GM/100ML-% IV SOLN
2.0000 g | INTRAVENOUS | Status: AC
  Administered 2024-09-23: 2 g via INTRAVENOUS
  Filled 2024-09-23: qty 100

## 2024-09-23 MED ORDER — OXYCODONE HCL 5 MG PO TABS: 5.0000 mg | ORAL_TABLET | ORAL | 0 refills | Status: AC | PRN

## 2024-09-23 MED ORDER — STERILE WATER FOR IRRIGATION IR SOLN
Status: DC | PRN
  Administered 2024-09-23: 2000 mL

## 2024-09-23 MED ORDER — CEFAZOLIN SODIUM-DEXTROSE 2-4 GM/100ML-% IV SOLN
INTRAVENOUS | Status: AC
  Filled 2024-09-23: qty 100

## 2024-09-23 MED ORDER — OXYCODONE HCL 5 MG PO TABS
10.0000 mg | ORAL_TABLET | ORAL | Status: DC | PRN
  Administered 2024-09-23: 15 mg via ORAL
  Administered 2024-09-23: 10 mg via ORAL

## 2024-09-23 MED ORDER — ONDANSETRON HCL 4 MG/2ML IJ SOLN: 4.0000 mg | Freq: Four times a day (QID) | INTRAMUSCULAR | Status: DC | PRN

## 2024-09-23 MED ORDER — LACTATED RINGERS IV BOLUS
500.0000 mL | Freq: Once | INTRAVENOUS | Status: AC
  Administered 2024-09-23: 500 mL via INTRAVENOUS

## 2024-09-23 MED ORDER — SENNA 8.6 MG PO TABS: 1.0000 | ORAL_TABLET | Freq: Every day | ORAL | 0 refills | Status: AC

## 2024-09-23 MED ORDER — CHLORHEXIDINE GLUCONATE 0.12 % MT SOLN
15.0000 mL | Freq: Once | OROMUCOSAL | Status: AC
  Administered 2024-09-23: 15 mL via OROMUCOSAL

## 2024-09-23 MED ORDER — OXYCODONE HCL 5 MG PO TABS
ORAL_TABLET | ORAL | Status: AC
  Filled 2024-09-23: qty 2

## 2024-09-23 MED ORDER — OXYCODONE HCL 5 MG PO TABS
ORAL_TABLET | ORAL | Status: AC
  Filled 2024-09-23: qty 3

## 2024-09-23 MED ORDER — FENTANYL CITRATE (PF) 100 MCG/2ML IJ SOLN
INTRAMUSCULAR | Status: AC
  Filled 2024-09-23: qty 2

## 2024-09-23 MED ORDER — METHOCARBAMOL 1000 MG/10ML IJ SOLN: 500.0000 mg | Freq: Four times a day (QID) | INTRAMUSCULAR | Status: DC | PRN

## 2024-09-23 MED ORDER — MEPIVACAINE HCL (PF) 2 % IJ SOLN
INTRAMUSCULAR | Status: DC | PRN
  Administered 2024-09-23: 3 mL via EPIDURAL

## 2024-09-23 MED ORDER — FENTANYL CITRATE (PF) 50 MCG/ML IJ SOSY
25.0000 ug | PREFILLED_SYRINGE | INTRAMUSCULAR | Status: DC | PRN
  Administered 2024-09-23 (×3): 50 ug via INTRAVENOUS

## 2024-09-23 MED ORDER — CEFAZOLIN SODIUM-DEXTROSE 2-4 GM/100ML-% IV SOLN
2.0000 g | Freq: Four times a day (QID) | INTRAVENOUS | Status: DC
  Administered 2024-09-23: 2 g via INTRAVENOUS

## 2024-09-23 MED ORDER — BUPIVACAINE-EPINEPHRINE (PF) 0.25% -1:200000 IJ SOLN
INTRAMUSCULAR | Status: AC
  Filled 2024-09-23: qty 30

## 2024-09-23 MED ORDER — FENTANYL CITRATE (PF) 50 MCG/ML IJ SOSY
PREFILLED_SYRINGE | INTRAMUSCULAR | Status: AC
  Filled 2024-09-23: qty 2

## 2024-09-23 MED ORDER — METHOCARBAMOL 500 MG PO TABS
500.0000 mg | ORAL_TABLET | Freq: Four times a day (QID) | ORAL | Status: DC | PRN
  Administered 2024-09-23: 500 mg via ORAL

## 2024-09-23 MED ORDER — TRANEXAMIC ACID-NACL 1000-0.7 MG/100ML-% IV SOLN
1000.0000 mg | Freq: Once | INTRAVENOUS | Status: AC
  Administered 2024-09-23: 1000 mg via INTRAVENOUS

## 2024-09-23 MED ORDER — TRANEXAMIC ACID-NACL 1000-0.7 MG/100ML-% IV SOLN
INTRAVENOUS | Status: AC
  Filled 2024-09-23: qty 100

## 2024-09-23 MED ORDER — LACTATED RINGERS IV SOLN: INTRAVENOUS | Status: DC

## 2024-09-23 MED ORDER — TRANEXAMIC ACID-NACL 1000-0.7 MG/100ML-% IV SOLN
1000.0000 mg | INTRAVENOUS | Status: AC
  Administered 2024-09-23: 1000 mg via INTRAVENOUS
  Filled 2024-09-23: qty 100

## 2024-09-23 MED ORDER — PROPOFOL 500 MG/50ML IV EMUL
INTRAVENOUS | Status: DC | PRN
  Administered 2024-09-23: 150 ug/kg/min via INTRAVENOUS

## 2024-09-23 MED ORDER — OXYCODONE HCL 5 MG PO TABS: 5.0000 mg | ORAL_TABLET | ORAL | Status: DC | PRN

## 2024-09-23 MED ORDER — ONDANSETRON HCL 4 MG PO TABS: 4.0000 mg | ORAL_TABLET | Freq: Four times a day (QID) | ORAL | Status: DC | PRN

## 2024-09-23 MED ORDER — PROPOFOL 10 MG/ML IV BOLUS
INTRAVENOUS | Status: AC
  Filled 2024-09-23: qty 20

## 2024-09-23 MED ORDER — ONDANSETRON HCL 4 MG/2ML IJ SOLN
INTRAMUSCULAR | Status: DC | PRN
  Administered 2024-09-23: 4 mg via INTRAVENOUS

## 2024-09-23 MED ORDER — LACTATED RINGERS IV SOLN: INTRAVENOUS | Status: DC | PRN

## 2024-09-23 MED ORDER — 0.9 % SODIUM CHLORIDE (POUR BTL) OPTIME
TOPICAL | Status: DC | PRN
  Administered 2024-09-23: 1000 mL

## 2024-09-23 MED ORDER — METHOCARBAMOL 500 MG PO TABS
ORAL_TABLET | ORAL | Status: AC
  Filled 2024-09-23: qty 1

## 2024-09-23 MED ORDER — METOCLOPRAMIDE HCL 5 MG PO TABS: 5.0000 mg | ORAL_TABLET | Freq: Three times a day (TID) | ORAL | Status: DC | PRN

## 2024-09-23 MED ORDER — METOCLOPRAMIDE HCL 5 MG/ML IJ SOLN: 5.0000 mg | Freq: Three times a day (TID) | INTRAMUSCULAR | Status: DC | PRN

## 2024-09-23 MED ORDER — METHOCARBAMOL 500 MG PO TABS: 500.0000 mg | ORAL_TABLET | Freq: Four times a day (QID) | ORAL | 1 refills | Status: AC | PRN

## 2024-09-23 MED ORDER — ACETAMINOPHEN 500 MG PO TABS
1000.0000 mg | ORAL_TABLET | Freq: Once | ORAL | Status: AC
  Administered 2024-09-23: 1000 mg via ORAL
  Filled 2024-09-23: qty 2

## 2024-09-23 NOTE — Anesthesia Procedure Notes (Signed)
 Spinal  Patient location during procedure: OR Start time: 09/23/2024 12:02 PM End time: 09/23/2024 12:09 PM Reason for block: surgical anesthesia  Staffing Performed: anesthesiologist  Authorized by: Paul Lamarr BRAVO, MD   Performed by: Gilford Lardizabal E, MD  Preanesthetic Checklist Completed: patient identified, IV checked, risks and benefits discussed, surgical consent, monitors and equipment checked, pre-op evaluation and timeout performed Spinal Block Patient position: sitting Prep: DuraPrep and site prepped and draped Patient monitoring: continuous pulse ox, blood pressure and heart rate Approach: midline Location: L3-4 Injection technique: single-shot Needle Needle type: Whitacre  Needle gauge: 24 G Needle length: 9 cm Assessment Events: CSF return and second provider  Additional Notes Risks, benefits, and alternative discussed. Patient gave consent to procedure. Prepped and draped in sitting position. Patient sedated but responsive to voice. CRNA attempts unsuccessful, midline and right paramedian at L3-4 by myself with Whitacre 22g needle with clear CSF obtained with 9cm needle hubbed into subcutaneous tissue. Positive terminal aspiration. No pain or paraesthesias with injection. Patient tolerated procedure well. Vital signs stable. LANEY Paul, MD

## 2024-09-23 NOTE — Care Plan (Signed)
 Ortho Bundle Case Management Note  Patient Details  Name: [name] MRN: [id number] Date of Birth: 03/16/1946  LT THA on 09/23/24  DCP: Home with wife  DME: No needs  PT: HEP                   DME Arranged:  N/A DME Agency:  NA  HH Arranged:    HH Agency:     Additional Comments: Please contact me with any questions of if this plan should need to change.  Burnard Dross, Case Manager [facility] [phone] Ext. [phone]   09/23/2024, 1:06 PM

## 2024-09-23 NOTE — Anesthesia Procedure Notes (Addendum)
 Procedure Name: MAC Date/Time: 09/23/2024 11:50 AM  Performed by: Franchot Delon RAMAN, CRNAPre-anesthesia Checklist: Patient identified, Emergency Drugs available, Suction available and Patient being monitored Oxygen Delivery Method: Simple face mask Ventilation: Oral airway inserted - appropriate to patient size and Nasal airway inserted- appropriate to patient size Placement Confirmation: positive ETCO2 Dental Injury: Teeth and Oropharynx as per pre-operative assessment  Comments: 8.0 NPA easily inserted to R nare.

## 2024-09-23 NOTE — Discharge Instructions (Signed)

## 2024-09-23 NOTE — Anesthesia Postprocedure Evaluation (Signed)
"   Anesthesia Post Note  Patient: [name]  Procedure(s) Performed: ARTHROPLASTY, HIP, TOTAL, ANTERIOR APPROACH (Left: Hip)     Patient location during evaluation: PACU Anesthesia Type: Spinal Level of consciousness: awake and alert Pain management: pain level controlled Vital Signs Assessment: post-procedure vital signs reviewed and stable Respiratory status: spontaneous breathing, nonlabored ventilation and respiratory function stable Cardiovascular status: blood pressure returned to baseline Postop Assessment: no apparent nausea or vomiting, spinal receding, no headache and no backache Anesthetic complications: no   No notable events documented.  Last Vitals:  Vitals:   09/23/24 1415 09/23/24 1430  BP: (!) 99/53 (!) 100/52  Pulse: (!) 59 62  Resp: 13 16  Temp:    SpO2: 95% 94%    Last Pain:  Vitals:   09/23/24 1430  TempSrc:   PainSc: 3                  Vertell Row      "

## 2024-09-23 NOTE — Transfer of Care (Signed)
 Immediate Anesthesia Transfer of Care Note  Patient: [name]  Procedure(s) Performed: ARTHROPLASTY, HIP, TOTAL, ANTERIOR APPROACH (Left: Hip)  Patient Location: PACU  Anesthesia Type:Spinal  Level of Consciousness: awake, alert , oriented, and patient cooperative  Airway & Oxygen Therapy: Patient Spontanous Breathing and Patient connected to face mask oxygen  Post-op Assessment: Report given to RN and Post -op Vital signs reviewed and stable  Post vital signs: Reviewed and stable  Last Vitals:  Vitals Value Taken Time  BP 120/58 09/23/24 13:40  Temp    Pulse 68 09/23/24 13:43  Resp 13 09/23/24 13:43  SpO2 99 % 09/23/24 13:43  Vitals shown include unfiled device data.  Last Pain:  Vitals:   09/23/24 1005  TempSrc:   PainSc: 0-No pain         Complications: No notable events documented.

## 2024-09-24 ENCOUNTER — Encounter (HOSPITAL_COMMUNITY): Payer: Self-pay | Admitting: Orthopedic Surgery

## 2024-09-25 NOTE — Progress Notes (Signed)
 Razi Hickle                                          MRN: [id number]   09/25/2024   The [facility] Quality Team Specialist reviewed this patient medical record for the purposes of chart review for care gap closure. The following were reviewed: chart review for care gap closure-controlling blood pressure.    [facility] Quality Team

## 2024-09-26 ENCOUNTER — Inpatient Hospital Stay: Admitting: Oncology

## 2024-09-26 ENCOUNTER — Inpatient Hospital Stay

## 2024-10-24 ENCOUNTER — Inpatient Hospital Stay

## 2024-10-24 ENCOUNTER — Inpatient Hospital Stay: Admitting: Oncology
